# Patient Record
Sex: Male | Born: 1975 | Race: Black or African American | Hispanic: No | Marital: Married | Smoking: Former smoker
Health system: Southern US, Community
[De-identification: ages and names within clinical notes are randomized; demographics above are authoritative.]

## PROBLEM LIST (undated history)

## (undated) ENCOUNTER — Ambulatory Visit
Payer: PRIVATE HEALTH INSURANCE | Attending: Student in an Organized Health Care Education/Training Program | Primary: Student in an Organized Health Care Education/Training Program

## (undated) ENCOUNTER — Encounter: Payer: BLUE CROSS/BLUE SHIELD | Attending: Clinical | Primary: Clinical

## (undated) ENCOUNTER — Encounter

## (undated) ENCOUNTER — Ambulatory Visit

## (undated) ENCOUNTER — Ambulatory Visit: Attending: Adult Health | Primary: Adult Health

## (undated) ENCOUNTER — Telehealth
Attending: Student in an Organized Health Care Education/Training Program | Primary: Student in an Organized Health Care Education/Training Program

## (undated) ENCOUNTER — Encounter: Attending: Adult Health | Primary: Adult Health

## (undated) ENCOUNTER — Encounter: Attending: Clinical | Primary: Clinical

## (undated) ENCOUNTER — Telehealth

## (undated) ENCOUNTER — Encounter
Attending: Student in an Organized Health Care Education/Training Program | Primary: Student in an Organized Health Care Education/Training Program

## (undated) ENCOUNTER — Encounter: Payer: BLUE CROSS/BLUE SHIELD | Attending: Adult Health | Primary: Adult Health

## (undated) ENCOUNTER — Ambulatory Visit: Payer: PRIVATE HEALTH INSURANCE

## (undated) ENCOUNTER — Ambulatory Visit: Payer: BLUE CROSS/BLUE SHIELD

## (undated) ENCOUNTER — Ambulatory Visit: Attending: Pharmacist | Primary: Pharmacist

## (undated) DIAGNOSIS — Z8659 Personal history of other mental and behavioral disorders: Secondary | ICD-10-CM

## (undated) DIAGNOSIS — W3400XA Accidental discharge from unspecified firearms or gun, initial encounter: Secondary | ICD-10-CM

## (undated) DIAGNOSIS — G8929 Other chronic pain: Secondary | ICD-10-CM

## (undated) DIAGNOSIS — E785 Hyperlipidemia, unspecified: Secondary | ICD-10-CM

## (undated) DIAGNOSIS — D649 Anemia, unspecified: Secondary | ICD-10-CM

## (undated) DIAGNOSIS — K219 Gastro-esophageal reflux disease without esophagitis: Secondary | ICD-10-CM

## (undated) DIAGNOSIS — R945 Abnormal results of liver function studies: Secondary | ICD-10-CM

## (undated) DIAGNOSIS — G709 Myoneural disorder, unspecified: Secondary | ICD-10-CM

## (undated) DIAGNOSIS — I272 Pulmonary hypertension, unspecified: Secondary | ICD-10-CM

## (undated) DIAGNOSIS — M25559 Pain in unspecified hip: Secondary | ICD-10-CM

## (undated) DIAGNOSIS — Z8489 Family history of other specified conditions: Secondary | ICD-10-CM

## (undated) DIAGNOSIS — J45909 Unspecified asthma, uncomplicated: Secondary | ICD-10-CM

## (undated) DIAGNOSIS — Z5189 Encounter for other specified aftercare: Secondary | ICD-10-CM

## (undated) DIAGNOSIS — I2510 Atherosclerotic heart disease of native coronary artery without angina pectoris: Secondary | ICD-10-CM

## (undated) DIAGNOSIS — R7989 Other specified abnormal findings of blood chemistry: Secondary | ICD-10-CM

## (undated) DIAGNOSIS — I10 Essential (primary) hypertension: Secondary | ICD-10-CM

## (undated) DIAGNOSIS — Z955 Presence of coronary angioplasty implant and graft: Secondary | ICD-10-CM

## (undated) DIAGNOSIS — I499 Cardiac arrhythmia, unspecified: Secondary | ICD-10-CM

## (undated) DIAGNOSIS — I509 Heart failure, unspecified: Secondary | ICD-10-CM

## (undated) DIAGNOSIS — Z79899 Other long term (current) drug therapy: Secondary | ICD-10-CM

## (undated) DIAGNOSIS — M25561 Pain in right knee: Secondary | ICD-10-CM

## (undated) HISTORY — DX: Other long term (current) drug therapy: Z79.899

## (undated) HISTORY — DX: Accidental discharge from unspecified firearms or gun, initial encounter: W34.00XA

## (undated) HISTORY — DX: Pain in unspecified hip: M25.559

## (undated) HISTORY — DX: Pulmonary hypertension, unspecified: I27.20

## (undated) HISTORY — DX: Atherosclerotic heart disease of native coronary artery without angina pectoris: I25.10

## (undated) HISTORY — DX: Gastro-esophageal reflux disease without esophagitis: K21.9

## (undated) HISTORY — DX: Encounter for other specified aftercare: Z51.89

## (undated) HISTORY — DX: Essential (primary) hypertension: I10

## (undated) HISTORY — DX: Presence of coronary angioplasty implant and graft: Z95.5

## (undated) HISTORY — PX: CHOLECYSTECTOMY: SHX55

## (undated) HISTORY — DX: Other chronic pain: G89.29

## (undated) HISTORY — DX: Hyperlipidemia, unspecified: E78.5

## (undated) HISTORY — PX: ABDOMINAL SURGERY: SHX537

## (undated) HISTORY — PX: ARM WOUND REPAIR / CLOSURE: SUR1141

---
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
## 2000-10-01 ENCOUNTER — Encounter: Payer: Self-pay | Admitting: Emergency Medicine

## 2000-10-01 ENCOUNTER — Emergency Department (HOSPITAL_COMMUNITY): Admission: EM | Admit: 2000-10-01 | Discharge: 2000-10-01 | Payer: Self-pay | Admitting: Emergency Medicine

## 2001-02-27 ENCOUNTER — Encounter: Payer: Self-pay | Admitting: Emergency Medicine

## 2001-02-27 ENCOUNTER — Emergency Department (HOSPITAL_COMMUNITY): Admission: EM | Admit: 2001-02-27 | Discharge: 2001-02-27 | Payer: Self-pay | Admitting: Emergency Medicine

## 2001-04-13 ENCOUNTER — Emergency Department (HOSPITAL_COMMUNITY): Admission: EM | Admit: 2001-04-13 | Discharge: 2001-04-13 | Payer: Self-pay | Admitting: Emergency Medicine

## 2001-04-13 ENCOUNTER — Encounter: Payer: Self-pay | Admitting: Emergency Medicine

## 2001-08-14 ENCOUNTER — Emergency Department (HOSPITAL_COMMUNITY): Admission: EM | Admit: 2001-08-14 | Discharge: 2001-08-14 | Payer: Self-pay | Admitting: Emergency Medicine

## 2001-11-02 ENCOUNTER — Encounter: Payer: Self-pay | Admitting: Emergency Medicine

## 2001-11-02 ENCOUNTER — Emergency Department (HOSPITAL_COMMUNITY): Admission: EM | Admit: 2001-11-02 | Discharge: 2001-11-02 | Payer: Self-pay | Admitting: Emergency Medicine

## 2001-11-09 ENCOUNTER — Emergency Department (HOSPITAL_COMMUNITY): Admission: EM | Admit: 2001-11-09 | Discharge: 2001-11-09 | Payer: Self-pay | Admitting: Emergency Medicine

## 2001-11-29 ENCOUNTER — Emergency Department (HOSPITAL_COMMUNITY): Admission: EM | Admit: 2001-11-29 | Discharge: 2001-11-29 | Payer: Self-pay | Admitting: Emergency Medicine

## 2003-10-23 ENCOUNTER — Other Ambulatory Visit: Payer: Self-pay

## 2004-04-19 ENCOUNTER — Emergency Department: Payer: Self-pay | Admitting: Emergency Medicine

## 2004-04-23 ENCOUNTER — Emergency Department: Payer: Self-pay | Admitting: Unknown Physician Specialty

## 2004-09-04 ENCOUNTER — Emergency Department: Payer: Self-pay | Admitting: Unknown Physician Specialty

## 2004-09-07 ENCOUNTER — Other Ambulatory Visit: Payer: Self-pay

## 2004-09-07 ENCOUNTER — Emergency Department: Payer: Self-pay | Admitting: Unknown Physician Specialty

## 2005-04-29 ENCOUNTER — Emergency Department: Payer: Self-pay | Admitting: Emergency Medicine

## 2006-08-04 ENCOUNTER — Emergency Department: Payer: Self-pay | Admitting: Internal Medicine

## 2006-10-15 ENCOUNTER — Emergency Department: Payer: Self-pay | Admitting: Emergency Medicine

## 2006-12-16 ENCOUNTER — Other Ambulatory Visit: Payer: Self-pay

## 2006-12-16 ENCOUNTER — Emergency Department: Payer: Self-pay | Admitting: Emergency Medicine

## 2007-02-07 ENCOUNTER — Emergency Department: Payer: Self-pay

## 2007-09-05 ENCOUNTER — Inpatient Hospital Stay: Payer: Self-pay | Admitting: Internal Medicine

## 2007-09-05 ENCOUNTER — Other Ambulatory Visit: Payer: Self-pay

## 2008-04-07 ENCOUNTER — Emergency Department: Payer: Self-pay | Admitting: Emergency Medicine

## 2008-07-03 ENCOUNTER — Emergency Department: Payer: Self-pay | Admitting: Unknown Physician Specialty

## 2009-10-02 ENCOUNTER — Ambulatory Visit: Payer: Self-pay | Admitting: Family Medicine

## 2010-01-21 ENCOUNTER — Emergency Department: Payer: Self-pay | Admitting: Emergency Medicine

## 2010-04-19 ENCOUNTER — Emergency Department: Payer: Self-pay | Admitting: Emergency Medicine

## 2012-02-16 ENCOUNTER — Emergency Department: Payer: Self-pay | Admitting: Emergency Medicine

## 2012-04-15 ENCOUNTER — Ambulatory Visit: Payer: Self-pay | Admitting: Family Medicine

## 2012-05-22 ENCOUNTER — Emergency Department: Payer: Self-pay | Admitting: Emergency Medicine

## 2012-05-24 LAB — BETA STREP CULTURE(ARMC)

## 2012-10-20 ENCOUNTER — Emergency Department: Payer: Self-pay

## 2012-12-12 ENCOUNTER — Ambulatory Visit: Payer: Self-pay | Admitting: Family Medicine

## 2012-12-22 ENCOUNTER — Encounter: Payer: Self-pay | Admitting: Family Medicine

## 2013-01-04 ENCOUNTER — Encounter: Payer: Self-pay | Admitting: Family Medicine

## 2013-07-22 ENCOUNTER — Emergency Department: Payer: Self-pay | Admitting: Internal Medicine

## 2013-07-22 LAB — URINALYSIS, COMPLETE
Bacteria: NONE SEEN
Bilirubin,UR: NEGATIVE
Glucose,UR: NEGATIVE mg/dL (ref 0–75)
Ketone: NEGATIVE
Leukocyte Esterase: NEGATIVE
Nitrite: NEGATIVE
Ph: 7 (ref 4.5–8.0)
Protein: 30
RBC,UR: <1 /HPF (ref 0–5)
Specific Gravity: 1.014 (ref 1.003–1.030)
Squamous Epithelial: NONE SEEN
WBC UR: NONE SEEN /HPF (ref 0–5)

## 2013-07-22 LAB — CBC
HCT: 45.1 % (ref 40.0–52.0)
HGB: 14 g/dL (ref 13.0–18.0)
MCH: 24.3 pg — ABNORMAL LOW (ref 26.0–34.0)
MCHC: 31.1 g/dL — ABNORMAL LOW (ref 32.0–36.0)
MCV: 78 fL — ABNORMAL LOW (ref 80–100)
Platelet: 399 10*3/uL (ref 150–440)
RBC: 5.78 10*6/uL (ref 4.40–5.90)
RDW: 15.3 % — ABNORMAL HIGH (ref 11.5–14.5)
WBC: 8.5 10*3/uL (ref 3.8–10.6)

## 2013-07-22 LAB — COMPREHENSIVE METABOLIC PANEL
Albumin: 3.6 g/dL (ref 3.4–5.0)
Alkaline Phosphatase: 148 U/L — ABNORMAL HIGH
Anion Gap: 7 (ref 7–16)
BUN: 12 mg/dL (ref 7–18)
Bilirubin,Total: 0.3 mg/dL (ref 0.2–1.0)
Calcium, Total: 9.3 mg/dL (ref 8.5–10.1)
Chloride: 101 mmol/L (ref 98–107)
Co2: 28 mmol/L (ref 21–32)
Creatinine: 1.19 mg/dL (ref 0.60–1.30)
EGFR (African American): >60
EGFR (Non-African Amer.): >60
Glucose: 110 mg/dL — ABNORMAL HIGH (ref 65–99)
Osmolality: 272 (ref 275–301)
Potassium: 3.9 mmol/L (ref 3.5–5.1)
SGOT(AST): 17 U/L (ref 15–37)
SGPT (ALT): 51 U/L (ref 12–78)
Sodium: 136 mmol/L (ref 136–145)
Total Protein: 9.3 g/dL — ABNORMAL HIGH (ref 6.4–8.2)

## 2013-11-13 ENCOUNTER — Ambulatory Visit: Payer: Self-pay | Admitting: Family Medicine

## 2013-11-27 ENCOUNTER — Emergency Department: Payer: Self-pay | Admitting: Emergency Medicine

## 2014-04-06 HISTORY — PX: BLADDER SURGERY: SHX569

## 2014-06-17 ENCOUNTER — Emergency Department: Payer: Self-pay | Admitting: Emergency Medicine

## 2014-09-14 DIAGNOSIS — W3400XA Accidental discharge from unspecified firearms or gun, initial encounter: Secondary | ICD-10-CM

## 2014-09-14 HISTORY — DX: Accidental discharge from unspecified firearms or gun, initial encounter: W34.00XA

## 2014-09-14 HISTORY — PX: KIDNEY SURGERY: SHX687

## 2014-09-27 ENCOUNTER — Ambulatory Visit: Payer: Self-pay | Admitting: Family Medicine

## 2014-10-01 ENCOUNTER — Encounter: Payer: Self-pay | Admitting: Family Medicine

## 2014-10-01 ENCOUNTER — Ambulatory Visit (INDEPENDENT_AMBULATORY_CARE_PROVIDER_SITE_OTHER): Payer: BLUE CROSS/BLUE SHIELD | Admitting: Family Medicine

## 2014-10-01 VITALS — BP 124/88 | HR 110 | Temp 97.7°F | Resp 18 | Ht 71.0 in | Wt 221.2 lb

## 2014-10-01 DIAGNOSIS — E785 Hyperlipidemia, unspecified: Secondary | ICD-10-CM | POA: Diagnosis not present

## 2014-10-01 DIAGNOSIS — M25561 Pain in right knee: Secondary | ICD-10-CM | POA: Insufficient documentation

## 2014-10-01 DIAGNOSIS — T07XXXA Unspecified multiple injuries, initial encounter: Secondary | ICD-10-CM

## 2014-10-01 DIAGNOSIS — I10 Essential (primary) hypertension: Secondary | ICD-10-CM | POA: Insufficient documentation

## 2014-10-01 DIAGNOSIS — R000 Tachycardia, unspecified: Secondary | ICD-10-CM | POA: Diagnosis not present

## 2014-10-01 DIAGNOSIS — R29898 Other symptoms and signs involving the musculoskeletal system: Secondary | ICD-10-CM | POA: Insufficient documentation

## 2014-10-01 DIAGNOSIS — T07 Unspecified multiple injuries: Secondary | ICD-10-CM | POA: Diagnosis not present

## 2014-10-01 HISTORY — DX: Pain in right knee: M25.561

## 2014-10-01 MED ORDER — HYDROCODONE-ACETAMINOPHEN 10-325 MG PO TABS: 1.0000 | ORAL_TABLET | Freq: Three times a day (TID) | ORAL | Status: DC | PRN

## 2014-10-01 NOTE — Patient Instructions (Signed)
Obesity Obesity is defined as having too much total body fat and a body mass index (BMI) of 30 or more. BMI is an estimate of body fat and is calculated from your height and weight. Obesity happens when you consume more calories than you can burn by exercising or performing daily physical tasks. Prolonged obesity can cause major illnesses or emergencies, such as:   Stroke.  Heart disease.  Diabetes.  Cancer.  Arthritis.  High blood pressure (hypertension).  High cholesterol.  Sleep apnea.  Erectile dysfunction.  Infertility problems. CAUSES   Regularly eating unhealthy foods.  Physical inactivity.  Certain disorders, such as an underactive thyroid (hypothyroidism), Cushing's syndrome, and polycystic ovarian syndrome.  Certain medicines, such as steroids, some depression medicines, and antipsychotics.  Genetics.  Lack of sleep. DIAGNOSIS  A health care provider can diagnose obesity after calculating your BMI. Obesity will be diagnosed if your BMI is 30 or higher.  There are other methods of measuring obesity levels. Some other methods include measuring your skinfold thickness, your waist circumference, and comparing your hip circumference to your waist circumference. TREATMENT  A healthy treatment program includes some or all of the following:  Long-term dietary changes.  Exercise and physical activity.  Behavioral and lifestyle changes.  Medicine only under the supervision of your health care provider. Medicines may help, but only if they are used with diet and exercise programs. An unhealthy treatment program includes:  Fasting.  Fad diets.  Supplements and drugs. These choices do not succeed in long-term weight control.  HOME CARE INSTRUCTIONS   Exercise and perform physical activity as directed by your health care provider. To increase physical activity, try the following:  Use stairs instead of elevators.  Park farther away from store  entrances.  Garden, bike, or walk instead of watching television or using the computer.  Eat healthy, low-calorie foods and drinks on a regular basis. Eat more fruits and vegetables. Use low-calorie cookbooks or take healthy cooking classes.  Limit fast food, sweets, and processed snack foods.  Eat smaller portions.  Keep a daily journal of everything you eat. There are many free websites to help you with this. It may be helpful to measure your foods so you can determine if you are eating the correct portion sizes.  Avoid drinking alcohol. Drink more water and drinks without calories.  Take vitamins and supplements only as recommended by your health care provider.  Weight-loss support groups, Tax adviser, counselors, and stress reduction education can also be very helpful. SEEK IMMEDIATE MEDICAL CARE IF:  You have chest pain or tightness.  You have trouble breathing or feel short of breath.  You have weakness or leg numbness.  You feel confused or have trouble talking.  You have sudden changes in your vision. MAKE SURE YOU:  Understand these instructions.  Will watch your condition.  Will get help right away if you are not doing well or get worse. Document Released: 04/30/2004 Document Revised: 08/07/2013 Document Reviewed: 04/29/2011 [facility] Patient Information 2015 [place], [place]. This information is not intended to replace advice given to you by your health care provider. Make sure you discuss any questions you have with your health care provider. Place obesity patient instructions here.
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
## 2014-10-09 ENCOUNTER — Encounter: Payer: Self-pay | Admitting: Physical Therapy

## 2014-10-09 ENCOUNTER — Ambulatory Visit: Payer: BLUE CROSS/BLUE SHIELD | Attending: Family Medicine | Admitting: Physical Therapy

## 2014-10-09 DIAGNOSIS — M25561 Pain in right knee: Secondary | ICD-10-CM | POA: Diagnosis present

## 2014-10-09 DIAGNOSIS — R531 Weakness: Secondary | ICD-10-CM | POA: Diagnosis not present

## 2014-10-09 DIAGNOSIS — R262 Difficulty in walking, not elsewhere classified: Secondary | ICD-10-CM | POA: Diagnosis not present
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
## 2014-10-16 ENCOUNTER — Encounter: Payer: Self-pay | Admitting: Physical Therapy

## 2014-10-16 ENCOUNTER — Ambulatory Visit: Payer: BLUE CROSS/BLUE SHIELD | Admitting: Physical Therapy

## 2014-10-16 DIAGNOSIS — R531 Weakness: Secondary | ICD-10-CM

## 2014-10-16 DIAGNOSIS — R262 Difficulty in walking, not elsewhere classified: Secondary | ICD-10-CM

## 2014-10-16 DIAGNOSIS — M25561 Pain in right knee: Secondary | ICD-10-CM

## 2014-10-16 NOTE — Patient Instructions (Addendum)
HIP / KNEE: Flexion, Heel Slides - Supine   Slide heel up toward buttocks, keeping leg in straight line. _10__ reps per set, _2__ sets per day, _5__ days per week Use towel or pillowcase under heel as needed.  Copyright  VHI. All rights reserved.  Hip Abduction / Adduction: with Knee Flexion (Supine)   With right knee bent, gently lower knee to side and return. Repeat _10___ times per set. Do __10__ sets per session. Do _2___ sessions per day.  http://orth.exer.us/682   Copyright  VHI. All rights reserved.  KNEE: Extension, Short Arc Quads - Supine   Place bolster under knees. Raise one leg until knee is straight. __8_ reps per set, _2__ sets per day, _5__ days per week   Copyright  VHI. All rights reserved.  ROM: Heel Prop   Lie with pillow under right heel. Tighten the muscles on the top of leg while trying to push knee toward the floor. Hold _3___ seconds. Repeat __10__ times. Do _2___ sessions per day.  http://gt2.exer.us/287   Copyright  VHI. All rights reserved.
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
## 2014-10-18 ENCOUNTER — Encounter: Payer: Self-pay | Admitting: Physical Therapy

## 2014-10-18 ENCOUNTER — Ambulatory Visit: Payer: BLUE CROSS/BLUE SHIELD | Admitting: Physical Therapy

## 2014-10-18 DIAGNOSIS — R262 Difficulty in walking, not elsewhere classified: Secondary | ICD-10-CM

## 2014-10-18 DIAGNOSIS — M25561 Pain in right knee: Secondary | ICD-10-CM

## 2014-10-18 DIAGNOSIS — R531 Weakness: Secondary | ICD-10-CM
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
## 2014-10-22 ENCOUNTER — Ambulatory Visit: Payer: BLUE CROSS/BLUE SHIELD | Admitting: Physical Therapy

## 2014-10-22 ENCOUNTER — Encounter: Payer: Self-pay | Admitting: Physical Therapy

## 2014-10-22 DIAGNOSIS — R531 Weakness: Secondary | ICD-10-CM

## 2014-10-22 DIAGNOSIS — M25561 Pain in right knee: Secondary | ICD-10-CM

## 2014-10-22 DIAGNOSIS — R262 Difficulty in walking, not elsewhere classified: Secondary | ICD-10-CM

## 2014-10-22 NOTE — Patient Instructions (Addendum)
Mini Squat   Place feet in line with hips. Look straight ahead, back straight. Bend knees _30___ degrees. May hold ___0_ pounds in each hand. Hold onto RW or counter as needed.  Hold ____ seconds. Repeat ____ times. Do ____ sessions per day. CAUTION: Move slowly. Wear supportive shoes as needed.  Copyright  VHI. All rights reserved.  Calf Raise: Sitting (Dumbbell)   While sitting, rise up on toes. (Do Not use weights on knees) Do __2__ sets. Complete ___15_ repetitions.  http://sb.exer.us/236   Copyright  VHI. All rights reserved.
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
## 2014-10-23 ENCOUNTER — Ambulatory Visit (INDEPENDENT_AMBULATORY_CARE_PROVIDER_SITE_OTHER): Payer: BLUE CROSS/BLUE SHIELD | Admitting: Family Medicine

## 2014-10-23 ENCOUNTER — Telehealth: Payer: Self-pay

## 2014-10-23 ENCOUNTER — Encounter: Payer: Self-pay | Admitting: Family Medicine

## 2014-10-23 VITALS — BP 124/86 | HR 102 | Temp 98.0°F | Resp 18 | Ht 71.0 in | Wt 220.0 lb

## 2014-10-23 DIAGNOSIS — M79604 Pain in right leg: Secondary | ICD-10-CM

## 2014-10-23 DIAGNOSIS — M25561 Pain in right knee: Secondary | ICD-10-CM | POA: Diagnosis not present

## 2014-10-23 DIAGNOSIS — M79675 Pain in left toe(s): Secondary | ICD-10-CM

## 2014-10-23 DIAGNOSIS — M795 Residual foreign body in soft tissue: Secondary | ICD-10-CM

## 2014-10-23 MED ORDER — HYDROCODONE-ACETAMINOPHEN 10-325 MG PO TABS: 1.0000 | ORAL_TABLET | Freq: Three times a day (TID) | ORAL | Status: DC | PRN
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
## 2014-10-23 NOTE — Telephone Encounter (Signed)
Pt wife called and stated that Dale Kennedy forgot to speak to you about needing his insurance to cover the use of a wheelchair and what he would need to do for that.
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
## 2014-10-24 ENCOUNTER — Encounter: Payer: Self-pay | Admitting: Physical Therapy

## 2014-10-24 ENCOUNTER — Ambulatory Visit: Payer: BLUE CROSS/BLUE SHIELD | Admitting: Physical Therapy

## 2014-10-24 DIAGNOSIS — R262 Difficulty in walking, not elsewhere classified: Secondary | ICD-10-CM

## 2014-10-24 DIAGNOSIS — M25561 Pain in right knee: Secondary | ICD-10-CM | POA: Diagnosis not present

## 2014-10-24 DIAGNOSIS — R531 Weakness: Secondary | ICD-10-CM

## 2014-10-24 DIAGNOSIS — S3710XA Unspecified injury of ureter, initial encounter: Secondary | ICD-10-CM | POA: Insufficient documentation
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
## 2014-10-24 NOTE — Telephone Encounter (Signed)
Pt wife came by office and picked up.

## 2014-10-24 NOTE — Telephone Encounter (Signed)
Note on your desk.

## 2014-10-30 ENCOUNTER — Encounter: Payer: Self-pay | Admitting: Physical Therapy

## 2014-10-30 ENCOUNTER — Ambulatory Visit: Payer: BLUE CROSS/BLUE SHIELD | Admitting: Physical Therapy

## 2014-10-30 DIAGNOSIS — M25561 Pain in right knee: Secondary | ICD-10-CM

## 2014-10-30 DIAGNOSIS — R531 Weakness: Secondary | ICD-10-CM

## 2014-10-30 DIAGNOSIS — R262 Difficulty in walking, not elsewhere classified: Secondary | ICD-10-CM
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
## 2014-11-01 ENCOUNTER — Ambulatory Visit: Payer: BLUE CROSS/BLUE SHIELD | Admitting: Physical Therapy

## 2014-11-01 ENCOUNTER — Encounter: Payer: Self-pay | Admitting: Physical Therapy

## 2014-11-01 DIAGNOSIS — R262 Difficulty in walking, not elsewhere classified: Secondary | ICD-10-CM

## 2014-11-01 DIAGNOSIS — R531 Weakness: Secondary | ICD-10-CM

## 2014-11-01 DIAGNOSIS — M25561 Pain in right knee: Secondary | ICD-10-CM
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
## 2014-11-01 NOTE — Patient Instructions (Signed)
Hamstring Stretch, Seated (Strap, Two Chairs)   Sit with one leg extended onto facing chair. Loop strap over outstretched foot at ball of big toe. Lengthen spine. Hold for __20 seconds. Repeat __2__ times each leg.  Copyright  VHI. All rights reserved.  ANKLE: Dorsiflexion, Step Unilateral   Stand on step, hang one heel off back of step. Hold __20_ seconds. _2__ reps per set, _2__ sets per day, _5__ days per week. Hold onto a support.  Copyright  VHI. All rights reserved.

## 2014-11-05 ENCOUNTER — Ambulatory Visit: Payer: BLUE CROSS/BLUE SHIELD | Attending: Family Medicine | Admitting: Physical Therapy

## 2014-11-05 ENCOUNTER — Encounter: Payer: Self-pay | Admitting: Physical Therapy

## 2014-11-05 DIAGNOSIS — M25561 Pain in right knee: Secondary | ICD-10-CM

## 2014-11-05 DIAGNOSIS — R531 Weakness: Secondary | ICD-10-CM

## 2014-11-05 DIAGNOSIS — R262 Difficulty in walking, not elsewhere classified: Secondary | ICD-10-CM | POA: Diagnosis present
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
## 2014-11-07 ENCOUNTER — Ambulatory Visit: Payer: BLUE CROSS/BLUE SHIELD | Admitting: Physical Therapy

## 2014-11-07 ENCOUNTER — Encounter: Payer: Self-pay | Admitting: Physical Therapy

## 2014-11-07 DIAGNOSIS — R531 Weakness: Secondary | ICD-10-CM

## 2014-11-07 DIAGNOSIS — M25561 Pain in right knee: Secondary | ICD-10-CM

## 2014-11-07 DIAGNOSIS — R262 Difficulty in walking, not elsewhere classified: Secondary | ICD-10-CM
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
## 2014-11-12 ENCOUNTER — Encounter: Payer: Self-pay | Admitting: Physical Therapy

## 2014-11-12 ENCOUNTER — Ambulatory Visit: Payer: BLUE CROSS/BLUE SHIELD | Admitting: Physical Therapy

## 2014-11-12 DIAGNOSIS — R262 Difficulty in walking, not elsewhere classified: Secondary | ICD-10-CM

## 2014-11-12 DIAGNOSIS — M25561 Pain in right knee: Secondary | ICD-10-CM

## 2014-11-12 DIAGNOSIS — R531 Weakness: Secondary | ICD-10-CM
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
## 2014-11-14 ENCOUNTER — Ambulatory Visit: Payer: BLUE CROSS/BLUE SHIELD | Admitting: Physical Therapy

## 2014-11-14 ENCOUNTER — Encounter: Payer: Self-pay | Admitting: Physical Therapy

## 2014-11-14 DIAGNOSIS — R531 Weakness: Secondary | ICD-10-CM

## 2014-11-14 DIAGNOSIS — M25561 Pain in right knee: Secondary | ICD-10-CM

## 2014-11-14 DIAGNOSIS — R262 Difficulty in walking, not elsewhere classified: Secondary | ICD-10-CM
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
## 2014-11-19 ENCOUNTER — Encounter: Payer: Self-pay | Admitting: Physical Therapy

## 2014-11-19 ENCOUNTER — Telehealth: Payer: Self-pay | Admitting: Family Medicine

## 2014-11-19 ENCOUNTER — Ambulatory Visit: Payer: BLUE CROSS/BLUE SHIELD | Admitting: Physical Therapy

## 2014-11-19 DIAGNOSIS — R531 Weakness: Secondary | ICD-10-CM

## 2014-11-19 DIAGNOSIS — R262 Difficulty in walking, not elsewhere classified: Secondary | ICD-10-CM

## 2014-11-19 DIAGNOSIS — M25561 Pain in right knee: Secondary | ICD-10-CM
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
## 2014-11-19 NOTE — Patient Instructions (Signed)
EXTENSION: Side-Lying (Manual Resistance)   Lie on left side with knees bent. Against resistance, straighten top knee. Complete _2__ sets of __8_ repetitions. Perform _2_ sessions per day.  Copyright  VHI. All rights reserved.

## 2014-11-19 NOTE — Telephone Encounter (Signed)
Dale Kennedy from [facility] received the referral but is needing Dr Clide Dales to write light weight on the prescription that was sent over. Please [phone]

## 2014-11-20 NOTE — Telephone Encounter (Signed)
Spoke with Levada Dy from [facility] and she will fax me the original prescription and I will have Dr. Rutherford Nail add what is needed,sign it,and fax it back to her.

## 2014-11-21 ENCOUNTER — Encounter: Payer: Self-pay | Admitting: Physical Therapy

## 2014-11-21 ENCOUNTER — Ambulatory Visit: Payer: BLUE CROSS/BLUE SHIELD | Admitting: Physical Therapy

## 2014-11-21 DIAGNOSIS — R531 Weakness: Secondary | ICD-10-CM

## 2014-11-21 DIAGNOSIS — R262 Difficulty in walking, not elsewhere classified: Secondary | ICD-10-CM

## 2014-11-21 DIAGNOSIS — M25561 Pain in right knee: Secondary | ICD-10-CM
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
## 2014-11-26 ENCOUNTER — Ambulatory Visit (INDEPENDENT_AMBULATORY_CARE_PROVIDER_SITE_OTHER): Payer: BLUE CROSS/BLUE SHIELD | Admitting: Family Medicine

## 2014-11-26 ENCOUNTER — Encounter: Payer: Self-pay | Admitting: Family Medicine

## 2014-11-26 ENCOUNTER — Ambulatory Visit: Payer: BLUE CROSS/BLUE SHIELD | Admitting: Physical Therapy

## 2014-11-26 VITALS — BP 126/80 | HR 84 | Temp 98.1°F | Resp 18 | Ht 71.0 in | Wt 199.0 lb

## 2014-11-26 DIAGNOSIS — G4700 Insomnia, unspecified: Secondary | ICD-10-CM | POA: Diagnosis not present

## 2014-11-26 DIAGNOSIS — M25561 Pain in right knee: Secondary | ICD-10-CM | POA: Diagnosis not present

## 2014-11-26 DIAGNOSIS — E785 Hyperlipidemia, unspecified: Secondary | ICD-10-CM | POA: Insufficient documentation

## 2014-11-26 DIAGNOSIS — R531 Weakness: Secondary | ICD-10-CM | POA: Diagnosis not present

## 2014-11-26 DIAGNOSIS — I10 Essential (primary) hypertension: Secondary | ICD-10-CM | POA: Diagnosis not present

## 2014-11-26 DIAGNOSIS — R262 Difficulty in walking, not elsewhere classified: Secondary | ICD-10-CM

## 2014-11-26 DIAGNOSIS — S3710XA Unspecified injury of ureter, initial encounter: Secondary | ICD-10-CM | POA: Diagnosis not present

## 2014-11-26 DIAGNOSIS — R29898 Other symptoms and signs involving the musculoskeletal system: Secondary | ICD-10-CM

## 2014-11-26 MED ORDER — ZOLPIDEM TARTRATE 5 MG PO TABS: 5.0000 mg | ORAL_TABLET | Freq: Every evening | ORAL | Status: DC | PRN

## 2014-11-26 MED ORDER — HYDROCODONE-ACETAMINOPHEN 10-325 MG PO TABS: 1.0000 | ORAL_TABLET | Freq: Three times a day (TID) | ORAL | Status: DC | PRN
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
## 2014-11-28 ENCOUNTER — Encounter: Payer: Self-pay | Admitting: Family Medicine

## 2014-11-28 ENCOUNTER — Ambulatory Visit: Payer: BLUE CROSS/BLUE SHIELD | Admitting: Physical Therapy

## 2014-11-28 ENCOUNTER — Encounter: Payer: Self-pay | Admitting: Physical Therapy

## 2014-11-28 DIAGNOSIS — G4700 Insomnia, unspecified: Secondary | ICD-10-CM | POA: Insufficient documentation

## 2014-11-28 DIAGNOSIS — R531 Weakness: Secondary | ICD-10-CM | POA: Diagnosis not present

## 2014-11-28 DIAGNOSIS — M25561 Pain in right knee: Secondary | ICD-10-CM

## 2014-11-28 DIAGNOSIS — R262 Difficulty in walking, not elsewhere classified: Secondary | ICD-10-CM
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
## 2014-12-03 ENCOUNTER — Encounter: Payer: Self-pay | Admitting: Physical Therapy

## 2014-12-03 ENCOUNTER — Ambulatory Visit: Payer: BLUE CROSS/BLUE SHIELD | Admitting: Physical Therapy

## 2014-12-03 DIAGNOSIS — R531 Weakness: Secondary | ICD-10-CM | POA: Diagnosis not present

## 2014-12-03 DIAGNOSIS — R262 Difficulty in walking, not elsewhere classified: Secondary | ICD-10-CM
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
## 2014-12-05 ENCOUNTER — Ambulatory Visit: Payer: BLUE CROSS/BLUE SHIELD | Admitting: Physical Therapy

## 2014-12-05 DIAGNOSIS — R531 Weakness: Secondary | ICD-10-CM | POA: Diagnosis not present

## 2014-12-05 DIAGNOSIS — M25561 Pain in right knee: Secondary | ICD-10-CM

## 2014-12-05 DIAGNOSIS — R262 Difficulty in walking, not elsewhere classified: Secondary | ICD-10-CM
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
## 2014-12-05 NOTE — Patient Instructions (Signed)
Ankle Awareness   Using support for balance, stand with feet shoulder width apart. Rise up on toes. Hold __2__ seconds. Relax. Repeat _12___ times. Do __2__ sessions per day.  http://gt2.exer.us/856   Copyright  VHI. All rights reserved.

## 2014-12-12 ENCOUNTER — Encounter: Payer: Self-pay | Admitting: Physical Therapy

## 2014-12-12 ENCOUNTER — Ambulatory Visit: Payer: BLUE CROSS/BLUE SHIELD | Attending: Family Medicine | Admitting: Physical Therapy

## 2014-12-12 DIAGNOSIS — M25561 Pain in right knee: Secondary | ICD-10-CM | POA: Diagnosis present

## 2014-12-12 DIAGNOSIS — R262 Difficulty in walking, not elsewhere classified: Secondary | ICD-10-CM | POA: Insufficient documentation

## 2014-12-12 DIAGNOSIS — R531 Weakness: Secondary | ICD-10-CM | POA: Insufficient documentation
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
## 2014-12-19 ENCOUNTER — Encounter: Payer: BLUE CROSS/BLUE SHIELD | Admitting: Physical Therapy

## 2014-12-19 ENCOUNTER — Ambulatory Visit: Payer: BLUE CROSS/BLUE SHIELD | Admitting: Physical Therapy

## 2014-12-19 ENCOUNTER — Encounter: Payer: Self-pay | Admitting: Physical Therapy

## 2014-12-19 DIAGNOSIS — R531 Weakness: Secondary | ICD-10-CM | POA: Diagnosis not present

## 2014-12-19 DIAGNOSIS — M25561 Pain in right knee: Secondary | ICD-10-CM

## 2014-12-19 DIAGNOSIS — R262 Difficulty in walking, not elsewhere classified: Secondary | ICD-10-CM
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
## 2014-12-21 ENCOUNTER — Ambulatory Visit: Payer: BLUE CROSS/BLUE SHIELD | Admitting: Physical Therapy

## 2014-12-21 ENCOUNTER — Encounter: Payer: Self-pay | Admitting: Physical Therapy

## 2014-12-21 DIAGNOSIS — R262 Difficulty in walking, not elsewhere classified: Secondary | ICD-10-CM

## 2014-12-21 DIAGNOSIS — M25561 Pain in right knee: Secondary | ICD-10-CM

## 2014-12-21 DIAGNOSIS — R531 Weakness: Secondary | ICD-10-CM | POA: Diagnosis not present
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
## 2014-12-24 ENCOUNTER — Ambulatory Visit: Payer: BLUE CROSS/BLUE SHIELD | Admitting: Physical Therapy

## 2014-12-24 ENCOUNTER — Encounter: Payer: Self-pay | Admitting: Physical Therapy

## 2014-12-24 DIAGNOSIS — R531 Weakness: Secondary | ICD-10-CM | POA: Diagnosis not present

## 2014-12-24 DIAGNOSIS — R262 Difficulty in walking, not elsewhere classified: Secondary | ICD-10-CM

## 2014-12-24 DIAGNOSIS — M25561 Pain in right knee: Secondary | ICD-10-CM
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
## 2014-12-26 ENCOUNTER — Encounter: Payer: BLUE CROSS/BLUE SHIELD | Admitting: Physical Therapy

## 2014-12-27 ENCOUNTER — Encounter: Payer: Self-pay | Admitting: Physical Therapy

## 2014-12-27 ENCOUNTER — Ambulatory Visit: Payer: BLUE CROSS/BLUE SHIELD | Admitting: Physical Therapy

## 2014-12-27 DIAGNOSIS — R531 Weakness: Secondary | ICD-10-CM | POA: Diagnosis not present

## 2014-12-27 DIAGNOSIS — M25561 Pain in right knee: Secondary | ICD-10-CM

## 2014-12-27 DIAGNOSIS — R262 Difficulty in walking, not elsewhere classified: Secondary | ICD-10-CM
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
## 2014-12-31 ENCOUNTER — Encounter: Payer: Self-pay | Admitting: Physical Therapy

## 2014-12-31 ENCOUNTER — Ambulatory Visit: Payer: BLUE CROSS/BLUE SHIELD | Admitting: Physical Therapy

## 2014-12-31 DIAGNOSIS — R531 Weakness: Secondary | ICD-10-CM | POA: Diagnosis not present

## 2014-12-31 DIAGNOSIS — R262 Difficulty in walking, not elsewhere classified: Secondary | ICD-10-CM

## 2014-12-31 DIAGNOSIS — M25561 Pain in right knee: Secondary | ICD-10-CM
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
## 2015-01-02 ENCOUNTER — Ambulatory Visit: Payer: BLUE CROSS/BLUE SHIELD | Admitting: Physical Therapy

## 2015-01-02 ENCOUNTER — Encounter: Payer: Self-pay | Admitting: Physical Therapy

## 2015-01-02 DIAGNOSIS — R531 Weakness: Secondary | ICD-10-CM

## 2015-01-02 DIAGNOSIS — M25561 Pain in right knee: Secondary | ICD-10-CM

## 2015-01-02 DIAGNOSIS — R262 Difficulty in walking, not elsewhere classified: Secondary | ICD-10-CM
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
## 2015-01-09 ENCOUNTER — Ambulatory Visit: Payer: BLUE CROSS/BLUE SHIELD | Attending: Family Medicine | Admitting: Physical Therapy

## 2015-01-09 ENCOUNTER — Encounter: Payer: Self-pay | Admitting: Physical Therapy

## 2015-01-09 DIAGNOSIS — R262 Difficulty in walking, not elsewhere classified: Secondary | ICD-10-CM | POA: Diagnosis present

## 2015-01-09 DIAGNOSIS — R531 Weakness: Secondary | ICD-10-CM

## 2015-01-09 DIAGNOSIS — M25561 Pain in right knee: Secondary | ICD-10-CM | POA: Diagnosis present
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
## 2015-01-16 ENCOUNTER — Ambulatory Visit: Payer: BLUE CROSS/BLUE SHIELD | Admitting: Physical Therapy

## 2015-01-16 ENCOUNTER — Encounter: Payer: Self-pay | Admitting: Physical Therapy

## 2015-01-16 DIAGNOSIS — R531 Weakness: Secondary | ICD-10-CM

## 2015-01-16 DIAGNOSIS — M25561 Pain in right knee: Secondary | ICD-10-CM

## 2015-01-16 DIAGNOSIS — R262 Difficulty in walking, not elsewhere classified: Secondary | ICD-10-CM
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
## 2015-01-23 ENCOUNTER — Other Ambulatory Visit: Payer: Self-pay | Admitting: Emergency Medicine

## 2015-01-23 ENCOUNTER — Ambulatory Visit: Payer: BLUE CROSS/BLUE SHIELD | Admitting: Physical Therapy

## 2015-01-23 ENCOUNTER — Encounter: Payer: Self-pay | Admitting: Physical Therapy

## 2015-01-23 DIAGNOSIS — R262 Difficulty in walking, not elsewhere classified: Secondary | ICD-10-CM

## 2015-01-23 DIAGNOSIS — R531 Weakness: Secondary | ICD-10-CM

## 2015-01-23 DIAGNOSIS — M25561 Pain in right knee: Secondary | ICD-10-CM

## 2015-01-23 MED ORDER — HYDROCODONE-ACETAMINOPHEN 10-325 MG PO TABS: 1.0000 | ORAL_TABLET | Freq: Three times a day (TID) | ORAL | Status: DC | PRN
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
## 2015-01-29 ENCOUNTER — Encounter: Payer: Self-pay | Admitting: Family Medicine

## 2015-01-29 ENCOUNTER — Ambulatory Visit (INDEPENDENT_AMBULATORY_CARE_PROVIDER_SITE_OTHER): Payer: BLUE CROSS/BLUE SHIELD | Admitting: Family Medicine

## 2015-01-29 VITALS — BP 122/70 | HR 78 | Temp 98.0°F | Resp 18 | Ht 71.0 in | Wt 208.4 lb

## 2015-01-29 DIAGNOSIS — T07 Unspecified multiple injuries: Secondary | ICD-10-CM | POA: Diagnosis not present

## 2015-01-29 DIAGNOSIS — E785 Hyperlipidemia, unspecified: Secondary | ICD-10-CM | POA: Diagnosis not present

## 2015-01-29 DIAGNOSIS — I10 Essential (primary) hypertension: Secondary | ICD-10-CM

## 2015-01-29 DIAGNOSIS — T07XXXA Unspecified multiple injuries, initial encounter: Secondary | ICD-10-CM

## 2015-01-29 DIAGNOSIS — Z23 Encounter for immunization: Secondary | ICD-10-CM | POA: Diagnosis not present

## 2015-01-29 DIAGNOSIS — R945 Abnormal results of liver function studies: Secondary | ICD-10-CM

## 2015-01-29 DIAGNOSIS — R7989 Other specified abnormal findings of blood chemistry: Secondary | ICD-10-CM | POA: Diagnosis not present

## 2015-01-29 DIAGNOSIS — R29898 Other symptoms and signs involving the musculoskeletal system: Secondary | ICD-10-CM
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
## 2015-01-30 ENCOUNTER — Encounter: Payer: Self-pay | Admitting: Physical Therapy

## 2015-01-30 ENCOUNTER — Ambulatory Visit: Payer: BLUE CROSS/BLUE SHIELD | Admitting: Physical Therapy

## 2015-01-30 DIAGNOSIS — M25561 Pain in right knee: Secondary | ICD-10-CM

## 2015-01-30 DIAGNOSIS — R531 Weakness: Secondary | ICD-10-CM | POA: Diagnosis not present

## 2015-01-30 DIAGNOSIS — R262 Difficulty in walking, not elsewhere classified: Secondary | ICD-10-CM
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
## 2015-02-06 ENCOUNTER — Encounter: Payer: Self-pay | Admitting: Physical Therapy

## 2015-02-06 ENCOUNTER — Ambulatory Visit: Payer: BLUE CROSS/BLUE SHIELD | Attending: Family Medicine | Admitting: Physical Therapy

## 2015-02-06 DIAGNOSIS — R531 Weakness: Secondary | ICD-10-CM

## 2015-02-06 DIAGNOSIS — M25561 Pain in right knee: Secondary | ICD-10-CM

## 2015-02-06 DIAGNOSIS — R262 Difficulty in walking, not elsewhere classified: Secondary | ICD-10-CM | POA: Diagnosis present
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
## 2015-02-13 ENCOUNTER — Ambulatory Visit: Payer: BLUE CROSS/BLUE SHIELD | Admitting: Physical Therapy

## 2015-02-13 ENCOUNTER — Encounter: Payer: Self-pay | Admitting: Physical Therapy

## 2015-02-13 DIAGNOSIS — M25561 Pain in right knee: Secondary | ICD-10-CM

## 2015-02-13 DIAGNOSIS — R531 Weakness: Secondary | ICD-10-CM | POA: Diagnosis not present

## 2015-02-13 DIAGNOSIS — R262 Difficulty in walking, not elsewhere classified: Secondary | ICD-10-CM
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
## 2015-02-25 ENCOUNTER — Telehealth: Payer: Self-pay | Admitting: Family Medicine

## 2015-02-25 DIAGNOSIS — M25561 Pain in right knee: Secondary | ICD-10-CM

## 2015-02-25 NOTE — Telephone Encounter (Signed)
Requesting refill on Hydrocodone. He only have enough to last through today

## 2015-02-26 MED ORDER — HYDROCODONE-ACETAMINOPHEN 10-325 MG PO TABS: 1.0000 | ORAL_TABLET | Freq: Three times a day (TID) | ORAL | Status: DC | PRN

## 2015-02-26 NOTE — Telephone Encounter (Signed)
Script printed for Dr. Rutherford Nail to sign. Family notified for pick up

## 2015-03-28 ENCOUNTER — Telehealth: Payer: Self-pay

## 2015-03-28 ENCOUNTER — Other Ambulatory Visit: Payer: Self-pay

## 2015-03-28 DIAGNOSIS — M25561 Pain in right knee: Secondary | ICD-10-CM

## 2015-03-28 MED ORDER — IBUPROFEN 800 MG PO TABS: 800.0000 mg | ORAL_TABLET | Freq: Three times a day (TID) | ORAL | Status: DC

## 2015-03-28 MED ORDER — HYDROCODONE-ACETAMINOPHEN 10-325 MG PO TABS: 1.0000 | ORAL_TABLET | Freq: Three times a day (TID) | ORAL | Status: DC | PRN

## 2015-03-28 NOTE — Telephone Encounter (Signed)
Patient requesting refill. 

## 2015-03-28 NOTE — Telephone Encounter (Signed)
Pt called and stated that he is out of his pain meds and would like a refill. Would you be willing to refill this one?

## 2015-03-28 NOTE — Telephone Encounter (Signed)
done

## 2015-04-30 ENCOUNTER — Telehealth: Payer: Self-pay | Admitting: Family Medicine

## 2015-04-30 NOTE — Telephone Encounter (Signed)
Requesting refill on Hydrocodone and Rantacid. Please send to [facility] [address]. Please call once ready

## 2015-05-01 ENCOUNTER — Ambulatory Visit: Payer: BLUE CROSS/BLUE SHIELD | Admitting: Family Medicine

## 2015-05-03 ENCOUNTER — Ambulatory Visit (INDEPENDENT_AMBULATORY_CARE_PROVIDER_SITE_OTHER): Payer: BLUE CROSS/BLUE SHIELD | Admitting: Family Medicine

## 2015-05-03 ENCOUNTER — Encounter: Payer: Self-pay | Admitting: Family Medicine

## 2015-05-03 VITALS — BP 124/80 | HR 89 | Temp 98.1°F | Resp 18 | Wt 223.4 lb

## 2015-05-03 DIAGNOSIS — E785 Hyperlipidemia, unspecified: Secondary | ICD-10-CM | POA: Diagnosis not present

## 2015-05-03 DIAGNOSIS — G8929 Other chronic pain: Secondary | ICD-10-CM | POA: Diagnosis not present

## 2015-05-03 DIAGNOSIS — R7989 Other specified abnormal findings of blood chemistry: Secondary | ICD-10-CM | POA: Diagnosis not present

## 2015-05-03 DIAGNOSIS — R52 Pain, unspecified: Secondary | ICD-10-CM | POA: Diagnosis not present

## 2015-05-03 DIAGNOSIS — I10 Essential (primary) hypertension: Secondary | ICD-10-CM | POA: Diagnosis not present

## 2015-05-03 DIAGNOSIS — Z113 Encounter for screening for infections with a predominantly sexual mode of transmission: Secondary | ICD-10-CM | POA: Diagnosis not present

## 2015-05-03 DIAGNOSIS — R945 Abnormal results of liver function studies: Secondary | ICD-10-CM

## 2015-05-03 MED ORDER — HYDROCODONE-ACETAMINOPHEN 10-325 MG PO TABS: 1.0000 | ORAL_TABLET | Freq: Three times a day (TID) | ORAL | Status: DC | PRN

## 2015-05-03 MED ORDER — AMLODIPINE BESYLATE 5 MG PO TABS: 5.0000 mg | ORAL_TABLET | Freq: Every day | ORAL | Status: DC

## 2015-05-03 MED ORDER — METOPROLOL SUCCINATE ER 25 MG PO TB24: 25.0000 mg | ORAL_TABLET | Freq: Every day | ORAL | Status: DC

## 2015-05-03 MED ORDER — ATORVASTATIN CALCIUM 40 MG PO TABS: 40.0000 mg | ORAL_TABLET | Freq: Every day | ORAL | Status: DC
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
## 2015-05-07 ENCOUNTER — Other Ambulatory Visit: Payer: Self-pay | Admitting: Family Medicine

## 2015-05-07 ENCOUNTER — Ambulatory Visit: Payer: BLUE CROSS/BLUE SHIELD | Admitting: Family Medicine

## 2015-06-01 LAB — COMPREHENSIVE METABOLIC PANEL
ALT: 49 IU/L — AB (ref 0–44)
AST: 27 IU/L (ref 0–40)
Albumin/Globulin Ratio: 1.1 (ref 1.1–2.5)
Albumin: 4.2 g/dL (ref 3.5–5.5)
Alkaline Phosphatase: 172 IU/L — ABNORMAL HIGH (ref 39–117)
BILIRUBIN TOTAL: 0.3 mg/dL (ref 0.0–1.2)
BUN / CREAT RATIO: 15 (ref 8–19)
BUN: 15 mg/dL (ref 6–20)
CHLORIDE: 98 mmol/L (ref 96–106)
CO2: 23 mmol/L (ref 18–29)
CREATININE: 0.98 mg/dL (ref 0.76–1.27)
Calcium: 9.6 mg/dL (ref 8.7–10.2)
GFR calc Af Amer: 113 mL/min/{1.73_m2} (ref >59)
GFR calc non Af Amer: 97 mL/min/{1.73_m2} (ref >59)
GLUCOSE: 87 mg/dL (ref 65–99)
Globulin, Total: 3.8 g/dL (ref 1.5–4.5)
Potassium: 4.4 mmol/L (ref 3.5–5.2)
Sodium: 137 mmol/L (ref 134–144)
Total Protein: 8 g/dL (ref 6.0–8.5)

## 2015-06-01 LAB — TSH: TSH: 2.23 u[IU]/mL (ref 0.450–4.500)

## 2015-06-01 LAB — CBC WITH DIFFERENTIAL/PLATELET
BASOS ABS: 0 10*3/uL (ref 0.0–0.2)
Basos: 0 %
EOS (ABSOLUTE): 0.2 10*3/uL (ref 0.0–0.4)
Eos: 2 %
Hematocrit: 39.9 % (ref 37.5–51.0)
Hemoglobin: 12.6 g/dL (ref 12.6–17.7)
Immature Grans (Abs): 0 10*3/uL (ref 0.0–0.1)
Immature Granulocytes: 0 %
LYMPHS ABS: 1.7 10*3/uL (ref 0.7–3.1)
LYMPHS: 22 %
MCH: 23.2 pg — AB (ref 26.6–33.0)
MCHC: 31.6 g/dL (ref 31.5–35.7)
MCV: 73 fL — ABNORMAL LOW (ref 79–97)
MONOCYTES: 9 %
Monocytes Absolute: 0.6 10*3/uL (ref 0.1–0.9)
NEUTROS ABS: 4.9 10*3/uL (ref 1.4–7.0)
Neutrophils: 67 %
PLATELETS: 407 10*3/uL — AB (ref 150–379)
RBC: 5.44 x10E6/uL (ref 4.14–5.80)
RDW: 15.7 % — ABNORMAL HIGH (ref 12.3–15.4)
WBC: 7.4 10*3/uL (ref 3.4–10.8)

## 2015-06-01 LAB — LIPID PANEL
CHOL/HDL RATIO: 4.1 ratio (ref 0.0–5.0)
CHOLESTEROL TOTAL: 165 mg/dL (ref 100–199)
HDL: 40 mg/dL (ref >39)
LDL Calculated: 109 mg/dL — ABNORMAL HIGH (ref 0–99)
TRIGLYCERIDES: 79 mg/dL (ref 0–149)
VLDL Cholesterol Cal: 16 mg/dL (ref 5–40)

## 2015-06-01 LAB — HIV ANTIBODY (ROUTINE TESTING W REFLEX): HIV Screen 4th Generation wRfx: NONREACTIVE

## 2015-06-01 LAB — HEMOGLOBIN A1C
ESTIMATED AVERAGE GLUCOSE: 117 mg/dL
HEMOGLOBIN A1C: 5.7 % — AB (ref 4.8–5.6)

## 2015-07-16 DIAGNOSIS — N1330 Unspecified hydronephrosis: Secondary | ICD-10-CM | POA: Diagnosis not present

## 2015-07-16 DIAGNOSIS — Y33XXXD Other specified events, undetermined intent, subsequent encounter: Secondary | ICD-10-CM | POA: Diagnosis not present

## 2015-07-16 DIAGNOSIS — S3710XD Unspecified injury of ureter, subsequent encounter: Secondary | ICD-10-CM | POA: Diagnosis not present

## 2015-07-22 DIAGNOSIS — M6281 Muscle weakness (generalized): Secondary | ICD-10-CM | POA: Diagnosis not present

## 2015-07-25 ENCOUNTER — Other Ambulatory Visit: Payer: Self-pay

## 2015-07-25 DIAGNOSIS — R52 Pain, unspecified: Principal | ICD-10-CM

## 2015-07-25 DIAGNOSIS — G8929 Other chronic pain: Secondary | ICD-10-CM

## 2015-07-25 MED ORDER — HYDROCODONE-ACETAMINOPHEN 10-325 MG PO TABS: 1.0000 | ORAL_TABLET | Freq: Three times a day (TID) | ORAL | Status: DC | PRN

## 2015-07-26 ENCOUNTER — Ambulatory Visit: Payer: BLUE CROSS/BLUE SHIELD | Admitting: Family Medicine

## 2015-08-16 ENCOUNTER — Other Ambulatory Visit: Payer: Self-pay

## 2015-08-16 ENCOUNTER — Encounter (INDEPENDENT_AMBULATORY_CARE_PROVIDER_SITE_OTHER): Payer: Self-pay

## 2015-08-16 DIAGNOSIS — Z79899 Other long term (current) drug therapy: Secondary | ICD-10-CM | POA: Diagnosis not present

## 2015-08-16 DIAGNOSIS — R0600 Dyspnea, unspecified: Secondary | ICD-10-CM | POA: Diagnosis not present

## 2015-08-16 DIAGNOSIS — R5383 Other fatigue: Secondary | ICD-10-CM | POA: Diagnosis not present

## 2015-08-16 DIAGNOSIS — R05 Cough: Secondary | ICD-10-CM | POA: Diagnosis not present

## 2015-08-16 DIAGNOSIS — R0602 Shortness of breath: Secondary | ICD-10-CM | POA: Diagnosis not present

## 2015-08-16 DIAGNOSIS — Z8249 Family history of ischemic heart disease and other diseases of the circulatory system: Secondary | ICD-10-CM | POA: Diagnosis not present

## 2015-08-16 DIAGNOSIS — Z7982 Long term (current) use of aspirin: Secondary | ICD-10-CM | POA: Diagnosis not present

## 2015-08-16 DIAGNOSIS — I10 Essential (primary) hypertension: Secondary | ICD-10-CM | POA: Diagnosis not present

## 2015-08-16 MED ORDER — RANITIDINE HCL 150 MG PO TABS: 150.0000 mg | ORAL_TABLET | Freq: Two times a day (BID) | ORAL | Status: DC

## 2015-08-19 ENCOUNTER — Ambulatory Visit (INDEPENDENT_AMBULATORY_CARE_PROVIDER_SITE_OTHER): Payer: BLUE CROSS/BLUE SHIELD | Admitting: Family Medicine

## 2015-08-19 ENCOUNTER — Encounter: Payer: Self-pay | Admitting: Family Medicine

## 2015-08-19 VITALS — BP 130/80 | HR 81 | Temp 98.2°F | Resp 16 | Wt 232.0 lb

## 2015-08-19 DIAGNOSIS — R52 Pain, unspecified: Secondary | ICD-10-CM

## 2015-08-19 DIAGNOSIS — M25552 Pain in left hip: Secondary | ICD-10-CM

## 2015-08-19 DIAGNOSIS — T07 Unspecified multiple injuries: Secondary | ICD-10-CM

## 2015-08-19 DIAGNOSIS — T07XXXA Unspecified multiple injuries, initial encounter: Secondary | ICD-10-CM

## 2015-08-19 DIAGNOSIS — Z79899 Other long term (current) drug therapy: Secondary | ICD-10-CM | POA: Insufficient documentation

## 2015-08-19 DIAGNOSIS — Z79891 Long term (current) use of opiate analgesic: Secondary | ICD-10-CM

## 2015-08-19 DIAGNOSIS — G8929 Other chronic pain: Secondary | ICD-10-CM | POA: Insufficient documentation

## 2015-08-19 DIAGNOSIS — M25559 Pain in unspecified hip: Secondary | ICD-10-CM

## 2015-08-19 HISTORY — DX: Other chronic pain: G89.29

## 2015-08-19 HISTORY — DX: Other long term (current) drug therapy: Z79.899

## 2015-08-19 MED ORDER — HYDROCODONE-ACETAMINOPHEN 10-325 MG PO TABS: 1.0000 | ORAL_TABLET | Freq: Three times a day (TID) | ORAL | Status: DC | PRN

## 2015-08-19 NOTE — Assessment & Plan Note (Signed)
Continue pain medicine °

## 2015-08-19 NOTE — Assessment & Plan Note (Addendum)
Warned pt to never take with prescription pain medicine, alcohol, nerve pills, or sleeping pills; okay to take ibuprofen (but not until he's done with the prednisone); UDS ordered today per routine, nothing in particular raising suspicion for misuse or diversion

## 2015-08-19 NOTE — Patient Instructions (Addendum)
Don't take any ibuprofen while on the steriods Return in 3 months for medicine refills Try to limit saturated fats in your diet (bologna, hot dogs, barbeque, cheeseburgers, hamburgers, steak, bacon, sausage, cheese, etc.) and get more fresh fruits, vegetables, and whole grains

## 2015-08-19 NOTE — Assessment & Plan Note (Signed)
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
## 2015-08-21 DIAGNOSIS — M6281 Muscle weakness (generalized): Secondary | ICD-10-CM | POA: Diagnosis not present

## 2015-08-26 LAB — TOXASSURE SELECT 13 (MW), URINE

## 2015-08-28 ENCOUNTER — Encounter: Payer: Self-pay | Admitting: Family Medicine

## 2015-08-28 ENCOUNTER — Telehealth: Payer: Self-pay | Admitting: Family Medicine

## 2015-08-28 NOTE — Telephone Encounter (Signed)
Certainly, I'll be glad to write him a note Please ask him to read it before he leaves so I can change it if necessary; thank you
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
## 2015-08-29 ENCOUNTER — Encounter: Payer: Self-pay | Admitting: Family Medicine

## 2015-08-30 NOTE — Telephone Encounter (Signed)
Patient picked up the letter on 08-29-15

## 2015-09-23 ENCOUNTER — Telehealth: Payer: Self-pay | Admitting: Family Medicine

## 2015-09-23 MED ORDER — IBUPROFEN 800 MG PO TABS: 800.0000 mg | ORAL_TABLET | Freq: Three times a day (TID) | ORAL | Status: DC | PRN

## 2015-09-23 NOTE — Telephone Encounter (Signed)
Requesting refill on Tylenol 800mg . Please send to [facility]. Last seen 08-19-15 and next appt 11-19-15

## 2015-09-23 NOTE — Telephone Encounter (Signed)
I'm pretty sure he meant ibuprofen 800 mg which is on his med list Last creatinine Feb 2017 reviewed Rx approved

## 2015-09-23 NOTE — Telephone Encounter (Signed)
Patient informed prescription has been sent to pharmacy. 

## 2015-09-24 ENCOUNTER — Encounter (INDEPENDENT_AMBULATORY_CARE_PROVIDER_SITE_OTHER): Payer: Self-pay

## 2015-10-29 ENCOUNTER — Encounter: Payer: Self-pay | Admitting: Emergency Medicine

## 2015-10-29 ENCOUNTER — Emergency Department: Payer: BLUE CROSS/BLUE SHIELD

## 2015-10-29 ENCOUNTER — Emergency Department
Admission: EM | Admit: 2015-10-29 | Discharge: 2015-10-29 | Disposition: A | Discharge status: Home / Self Care | Payer: BLUE CROSS/BLUE SHIELD | Attending: Emergency Medicine | Admitting: Emergency Medicine

## 2015-10-29 DIAGNOSIS — I10 Essential (primary) hypertension: Secondary | ICD-10-CM | POA: Insufficient documentation

## 2015-10-29 DIAGNOSIS — R0602 Shortness of breath: Secondary | ICD-10-CM | POA: Diagnosis not present

## 2015-10-29 DIAGNOSIS — Z79899 Other long term (current) drug therapy: Secondary | ICD-10-CM | POA: Insufficient documentation

## 2015-10-29 DIAGNOSIS — Z87891 Personal history of nicotine dependence: Secondary | ICD-10-CM | POA: Insufficient documentation

## 2015-10-29 DIAGNOSIS — Z791 Long term (current) use of non-steroidal anti-inflammatories (NSAID): Secondary | ICD-10-CM | POA: Diagnosis not present

## 2015-10-29 DIAGNOSIS — Z7982 Long term (current) use of aspirin: Secondary | ICD-10-CM | POA: Diagnosis not present

## 2015-10-29 DIAGNOSIS — E785 Hyperlipidemia, unspecified: Secondary | ICD-10-CM | POA: Insufficient documentation

## 2015-10-29 DIAGNOSIS — R0789 Other chest pain: Secondary | ICD-10-CM | POA: Diagnosis not present

## 2015-10-29 DIAGNOSIS — K219 Gastro-esophageal reflux disease without esophagitis: Secondary | ICD-10-CM | POA: Diagnosis not present

## 2015-10-29 LAB — BASIC METABOLIC PANEL
ANION GAP: 8 (ref 5–15)
BUN: 14 mg/dL (ref 6–20)
CHLORIDE: 104 mmol/L (ref 101–111)
CO2: 26 mmol/L (ref 22–32)
Calcium: 8.8 mg/dL — ABNORMAL LOW (ref 8.9–10.3)
Creatinine, Ser: 0.92 mg/dL (ref 0.61–1.24)
GFR calc Af Amer: >60 mL/min (ref >60)
GLUCOSE: 102 mg/dL — AB (ref 65–99)
POTASSIUM: 3.3 mmol/L — AB (ref 3.5–5.1)
Sodium: 138 mmol/L (ref 135–145)

## 2015-10-29 LAB — TROPONIN I: Troponin I: <0.03 ng/mL (ref <0.03)

## 2015-10-29 LAB — CBC
HEMATOCRIT: 40.6 % (ref 40.0–52.0)
HEMOGLOBIN: 13.1 g/dL (ref 13.0–18.0)
MCH: 23.9 pg — ABNORMAL LOW (ref 26.0–34.0)
MCHC: 32.4 g/dL (ref 32.0–36.0)
MCV: 73.7 fL — AB (ref 80.0–100.0)
Platelets: 345 10*3/uL (ref 150–440)
RBC: 5.5 MIL/uL (ref 4.40–5.90)
RDW: 17.2 % — AB (ref 11.5–14.5)
WBC: 8.1 10*3/uL (ref 3.8–10.6)

## 2015-10-29 MED ORDER — OMEPRAZOLE MAGNESIUM 20 MG PO TBEC: 20.0000 mg | DELAYED_RELEASE_TABLET | Freq: Every day | ORAL | 1 refills | Status: DC

## 2015-10-29 MED ORDER — GI COCKTAIL ~~LOC~~
30.0000 mL | ORAL | Status: AC
  Administered 2015-10-29: 30 mL via ORAL
  Filled 2015-10-29: qty 30

## 2015-10-29 MED ORDER — FAMOTIDINE IN NACL 20-0.9 MG/50ML-% IV SOLN
20.0000 mg | Freq: Once | INTRAVENOUS | Status: AC
  Administered 2015-10-29: 20 mg via INTRAVENOUS
  Filled 2015-10-29: qty 50

## 2015-10-29 MED ORDER — METOCLOPRAMIDE HCL 10 MG PO TABS: 10.0000 mg | ORAL_TABLET | Freq: Three times a day (TID) | ORAL | 0 refills | Status: DC
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
## 2015-10-29 NOTE — ED Notes (Signed)
Patient transported to X-ray 

## 2015-10-29 NOTE — ED Provider Notes (Signed)
-----------------------------------------   8:24 AM on 10/29/2015 -----------------------------------------  Patient's repeat troponin is negative. Patient will be discharged home with cardiology follow-up. The patient is agreeable to this plan.   Harvest Dark, MD 10/29/15 [phone]

## 2015-11-15 ENCOUNTER — Encounter: Payer: Self-pay | Admitting: Family Medicine

## 2015-11-15 ENCOUNTER — Ambulatory Visit (INDEPENDENT_AMBULATORY_CARE_PROVIDER_SITE_OTHER): Payer: BLUE CROSS/BLUE SHIELD | Admitting: Family Medicine

## 2015-11-15 DIAGNOSIS — R7989 Other specified abnormal findings of blood chemistry: Secondary | ICD-10-CM

## 2015-11-15 DIAGNOSIS — R1011 Right upper quadrant pain: Secondary | ICD-10-CM | POA: Diagnosis not present

## 2015-11-15 DIAGNOSIS — G8929 Other chronic pain: Secondary | ICD-10-CM

## 2015-11-15 DIAGNOSIS — R718 Other abnormality of red blood cells: Secondary | ICD-10-CM | POA: Diagnosis not present

## 2015-11-15 DIAGNOSIS — R7303 Prediabetes: Secondary | ICD-10-CM | POA: Diagnosis not present

## 2015-11-15 DIAGNOSIS — M25552 Pain in left hip: Secondary | ICD-10-CM | POA: Diagnosis not present

## 2015-11-15 DIAGNOSIS — R52 Pain, unspecified: Secondary | ICD-10-CM | POA: Diagnosis not present

## 2015-11-15 DIAGNOSIS — D509 Iron deficiency anemia, unspecified: Secondary | ICD-10-CM | POA: Insufficient documentation

## 2015-11-15 DIAGNOSIS — Z79899 Other long term (current) drug therapy: Secondary | ICD-10-CM

## 2015-11-15 DIAGNOSIS — R945 Abnormal results of liver function studies: Secondary | ICD-10-CM

## 2015-11-15 LAB — COMPREHENSIVE METABOLIC PANEL
ALT: 35 U/L (ref 9–46)
AST: 19 U/L (ref 10–40)
Albumin: 4.1 g/dL (ref 3.6–5.1)
Alkaline Phosphatase: 160 U/L — ABNORMAL HIGH (ref 40–115)
BUN: 14 mg/dL (ref 7–25)
CO2: 25 mmol/L (ref 20–31)
Calcium: 9.1 mg/dL (ref 8.6–10.3)
Chloride: 102 mmol/L (ref 98–110)
Creat: 1.1 mg/dL (ref 0.60–1.35)
GLUCOSE: 85 mg/dL (ref 65–99)
POTASSIUM: 4.2 mmol/L (ref 3.5–5.3)
Sodium: 136 mmol/L (ref 135–146)
TOTAL PROTEIN: 7.8 g/dL (ref 6.1–8.1)
Total Bilirubin: 0.4 mg/dL (ref 0.2–1.2)

## 2015-11-15 LAB — CBC WITH DIFFERENTIAL/PLATELET
BASOS PCT: 1 %
Basophils Absolute: 62 cells/uL (ref 0–200)
EOS PCT: 2 %
Eosinophils Absolute: 124 cells/uL (ref 15–500)
HEMATOCRIT: 40.7 % (ref 38.5–50.0)
Hemoglobin: 12.9 g/dL — ABNORMAL LOW (ref 13.2–17.1)
Lymphocytes Relative: 30 %
Lymphs Abs: 1860 cells/uL (ref 850–3900)
MCH: 23.6 pg — ABNORMAL LOW (ref 27.0–33.0)
MCHC: 31.7 g/dL — ABNORMAL LOW (ref 32.0–36.0)
MCV: 74.5 fL — ABNORMAL LOW (ref 80.0–100.0)
MPV: 9.4 fL (ref 7.5–12.5)
Monocytes Absolute: 744 cells/uL (ref 200–950)
Monocytes Relative: 12 %
NEUTROS PCT: 55 %
Neutro Abs: 3410 cells/uL (ref 1500–7800)
PLATELETS: 381 10*3/uL (ref 140–400)
RBC: 5.46 MIL/uL (ref 4.20–5.80)
RDW: 16.3 % — AB (ref 11.0–15.0)
WBC: 6.2 10*3/uL (ref 3.8–10.8)

## 2015-11-15 LAB — LIPID PANEL
CHOL/HDL RATIO: 5.5 ratio — AB (ref <=5.0)
CHOLESTEROL: 197 mg/dL (ref 125–200)
HDL: 36 mg/dL — ABNORMAL LOW (ref >=40)
LDL Cholesterol: 130 mg/dL — ABNORMAL HIGH (ref <130)
Triglycerides: 155 mg/dL — ABNORMAL HIGH (ref <150)
VLDL: 31 mg/dL — ABNORMAL HIGH (ref <30)

## 2015-11-15 LAB — HEMOGLOBIN A1C
HEMOGLOBIN A1C: 5.4 % (ref <5.7)
MEAN PLASMA GLUCOSE: 108 mg/dL

## 2015-11-15 LAB — FERRITIN: FERRITIN: 77 ng/mL (ref 20–345)

## 2015-11-15 MED ORDER — HYDROCODONE-ACETAMINOPHEN 10-325 MG PO TABS: 0.5000 | ORAL_TABLET | ORAL | 0 refills | Status: DC | PRN

## 2015-11-15 MED ORDER — ECONAZOLE NITRATE 1 % EX CREA: TOPICAL_CREAM | Freq: Every day | CUTANEOUS | 0 refills | Status: DC

## 2015-11-15 NOTE — Assessment & Plan Note (Signed)
Get scan of the gallbladder and liver; avoid fried foods and fatty foods until we get scan results

## 2015-11-15 NOTE — Patient Instructions (Addendum)
Stop the ibuprofen FOR NOW Spread the hydrocodone out by taking just half of a pill every four hours instead of one whole pill every eight hours if needed FOR NOW Let's check out your gallbladder If you develop worsening pain or bloody vomit or blood in your stool, go to the ER right away Try the new cream in the groin Limit white bread, and potatoes and rice, etc Avoid sugary drinks Check out the information at familydoctor.org entitled "Nutrition for Weight Loss: What You Need to Know about Fad Diets" Try to lose between 1-2 pounds per week by taking in fewer calories and burning off more calories You can succeed by limiting portions, limiting foods dense in calories and fat, becoming more active, and drinking 8 glasses of water a day (64 ounces) Don't skip meals, especially breakfast, as skipping meals may alter your metabolism Do not use over-the-counter weight loss pills or gimmicks that claim rapid weight loss A healthy BMI (or body mass index) is between 18.5 and 24.9 You can calculate your ideal BMI at the [place] website [employer]fr

## 2015-11-15 NOTE — Assessment & Plan Note (Signed)
[place] website reviewed; no red flags, no concern for diversion; Rx written

## 2015-11-15 NOTE — Assessment & Plan Note (Signed)
Check A1c; explained dx; cut down on whites, work on weight loss
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
## 2015-11-15 NOTE — Assessment & Plan Note (Signed)
Check liver enzymes 

## 2015-11-15 NOTE — Assessment & Plan Note (Signed)
May be thalassemia, check hemoglobinopathy panel

## 2015-11-15 NOTE — Assessment & Plan Note (Signed)
Tolerating medicine pretty well; will split the medicine in half for more frequent dosing at lower level; avoid alcohol, sleeping pills, other pain pills, etc.

## 2015-11-19 ENCOUNTER — Ambulatory Visit
Admission: RE | Admit: 2015-11-19 | Discharge: 2015-11-19 | Disposition: A | Discharge status: Home / Self Care | Payer: BLUE CROSS/BLUE SHIELD | Source: Ambulatory Visit | Attending: Family Medicine | Admitting: Family Medicine

## 2015-11-19 ENCOUNTER — Ambulatory Visit: Payer: BLUE CROSS/BLUE SHIELD | Admitting: Family Medicine

## 2015-11-19 DIAGNOSIS — R1011 Right upper quadrant pain: Secondary | ICD-10-CM

## 2015-11-19 LAB — HEMOGLOBINOPATHY EVALUATION
HEMATOCRIT: 40.7 % (ref 38.5–50.0)
HEMOGLOBIN: 12.9 g/dL — AB (ref 13.2–17.1)
Hgb A2 Quant: 2.2 % (ref 1.8–3.5)
Hgb A: 96.8 % (ref >96.0)
Hgb F Quant: <1 % (ref <2.0)
MCH: 23.6 pg — AB (ref 27.0–33.0)
MCV: 74.5 fL — AB (ref 80.0–100.0)
RBC: 5.46 MIL/uL (ref 4.20–5.80)
RDW: 16.3 % — ABNORMAL HIGH (ref 11.0–15.0)

## 2015-11-22 LAB — ALKALINE PHOSPHATASE ISOENZYMES
Alkaline Phonsphatase: 124 U/L — ABNORMAL HIGH (ref 40–115)
BONE ISOENZYMES (ALP ISO): 19 % — AB (ref 28–66)
Intestinal Isoenzymes: 3 % (ref 1–24)
LIVER ISOENZYMES (ALP ISO): 78 % — AB (ref 25–69)

## 2015-11-24 ENCOUNTER — Encounter: Payer: Self-pay | Admitting: Family Medicine

## 2015-12-05 ENCOUNTER — Encounter
Admission: RE | Admit: 2015-12-05 | Discharge: 2015-12-05 | Disposition: A | Discharge status: Home / Self Care | Payer: BLUE CROSS/BLUE SHIELD | Source: Ambulatory Visit | Attending: Orthopedic Surgery | Admitting: Orthopedic Surgery

## 2015-12-05 DIAGNOSIS — Z01818 Encounter for other preprocedural examination: Secondary | ICD-10-CM | POA: Diagnosis not present

## 2015-12-05 DIAGNOSIS — M25552 Pain in left hip: Secondary | ICD-10-CM | POA: Diagnosis not present

## 2015-12-05 DIAGNOSIS — M795 Residual foreign body in soft tissue: Secondary | ICD-10-CM | POA: Insufficient documentation

## 2015-12-05 HISTORY — DX: Myoneural disorder, unspecified: G70.9

## 2015-12-05 HISTORY — DX: Abnormal results of liver function studies: R94.5

## 2015-12-05 HISTORY — DX: Unspecified asthma, uncomplicated: J45.909

## 2015-12-05 HISTORY — DX: Personal history of other mental and behavioral disorders: Z86.59

## 2015-12-05 HISTORY — DX: Cardiac arrhythmia, unspecified: I49.9

## 2015-12-05 HISTORY — DX: Other specified abnormal findings of blood chemistry: R79.89

## 2015-12-05 HISTORY — DX: Anemia, unspecified: D64.9

## 2015-12-05 HISTORY — DX: Family history of other specified conditions: Z84.89

## 2015-12-05 NOTE — Pre-Procedure Instructions (Signed)
NOTED IN 10/29/15 ED VISIT REFERRAL TO CARDIOLOGY MADE. PATIENT STATES HE DID NOT REMEMBER. CARDIAC CLEARANCE REQUEST TO CASE[place] AT DR [facility] AS INSTRUCTED BY DR [id number]. PATIENT AWARE

## 2015-12-05 NOTE — Patient Instructions (Signed)
  Your procedure is scheduled on: 12/12/15 Report to Day Surgery. [facility] SECOND FLOOR To find out your arrival time please call [phone] between 1PM - 3PM on 12/11/15 Remember: Instructions that are not followed completely may result in serious medical risk, up to and including death, or upon the discretion of your surgeon and anesthesiologist your surgery may need to be rescheduled.    _X___ 1. Do not eat food or drink liquids after midnight. No gum chewing or hard candies.     ___X_ 2. No Alcohol for 24 hours before or after surgery.   _X___ 3. Do Not Smoke For 24 Hours Prior to Your Surgery.   ____ 4. Bring all medications with you on the day of surgery if instructed.    __X__ 5. Notify your doctor if there is any change in your medical condition     (cold, fever, infections).       Do not wear jewelry, make-up, hairpins, clips or nail polish.  Do not wear lotions, powders, or perfumes. You may wear deodorant.  Do not shave 48 hours prior to surgery. Men may shave face and neck.  Do not bring valuables to the hospital.    [facility] is not responsible for any belongings or valuables.               Contacts, dentures or bridgework may not be worn into surgery.  Leave your suitcase in the car. After surgery it may be brought to your room.  For patients admitted to the hospital, discharge time is determined by your                treatment team.   Patients discharged the day of surgery will not be allowed to drive home.   Please read over the following fact sheets that you were given:   Surgical Site Infection Prevention   _X___ Take these medicines the morning of surgery with A SIP OF WATER:    1. AMLODIPINE  2. METOPROLOL  3. OMEPRAZOLE AT BEDTIME 12/11/15 AND AM SURGERY  4.  5.  6.  ____ Fleet Enema (as directed)   __X__ Use CHG Soap as directed  __X__ Use inhalers on the day of surgery  ____ Stop metformin 2 days prior to surgery    ____ Take 1/2 of usual  insulin dose the night before surgery and none on the morning of surgery.   _X__ Stop Coumadin/Plavix/aspirin on              AS INSTRUCTED BY DR [facility] OR CARDIOLOGY  ____ Stop Anti-inflammatories on    ____ Stop supplements until after surgery.    ____ Bring C-Pap to the hospital.    YOU MUST GET CARDIAC CLEARANCE PRIOR TO SURGERY. DR [facility] OFFICE WILL CONTACT YOU AND ARRANGE

## 2015-12-10 ENCOUNTER — Telehealth: Payer: Self-pay | Admitting: Family Medicine

## 2015-12-10 NOTE — Telephone Encounter (Signed)
I left message for patient, asked him to return my call Alk phos isoenzymes are back

## 2015-12-13 NOTE — Telephone Encounter (Signed)
I talked to pt about labs He does not recall anyone talking about his alk phos in the last 6-24 months; looks like coming from liver Come back in to see me in the next two weeks and we'll talk more, get more tests (I plan to order anti-smooth muscle antibodies, ANA, GGT, etc) In the meantime, limit alcohol, tylenol

## 2015-12-24 ENCOUNTER — Encounter: Payer: Self-pay | Admitting: Family Medicine

## 2015-12-24 ENCOUNTER — Ambulatory Visit (INDEPENDENT_AMBULATORY_CARE_PROVIDER_SITE_OTHER): Payer: BLUE CROSS/BLUE SHIELD | Admitting: Family Medicine

## 2015-12-24 VITALS — BP 122/80 | HR 81 | Temp 98.2°F | Wt 245.8 lb

## 2015-12-24 DIAGNOSIS — R945 Abnormal results of liver function studies: Principal | ICD-10-CM

## 2015-12-24 DIAGNOSIS — Z79899 Other long term (current) drug therapy: Secondary | ICD-10-CM | POA: Diagnosis not present

## 2015-12-24 DIAGNOSIS — R718 Other abnormality of red blood cells: Secondary | ICD-10-CM | POA: Diagnosis not present

## 2015-12-24 DIAGNOSIS — E785 Hyperlipidemia, unspecified: Secondary | ICD-10-CM | POA: Diagnosis not present

## 2015-12-24 DIAGNOSIS — R7989 Other specified abnormal findings of blood chemistry: Secondary | ICD-10-CM

## 2015-12-24 LAB — COMPLETE METABOLIC PANEL WITH GFR
ALT: 34 U/L (ref 9–46)
AST: 20 U/L (ref 10–40)
Albumin: 4.1 g/dL (ref 3.6–5.1)
Alkaline Phosphatase: 140 U/L — ABNORMAL HIGH (ref 40–115)
BILIRUBIN TOTAL: 0.3 mg/dL (ref 0.2–1.2)
BUN: 12 mg/dL (ref 7–25)
CHLORIDE: 102 mmol/L (ref 98–110)
CO2: 24 mmol/L (ref 20–31)
Calcium: 8.9 mg/dL (ref 8.6–10.3)
Creat: 1.08 mg/dL (ref 0.60–1.35)
GFR, EST NON AFRICAN AMERICAN: 86 mL/min (ref >=60)
GLUCOSE: 87 mg/dL (ref 65–99)
Potassium: 4.3 mmol/L (ref 3.5–5.3)
SODIUM: 136 mmol/L (ref 135–146)
TOTAL PROTEIN: 8 g/dL (ref 6.1–8.1)

## 2015-12-24 LAB — GAMMA GT: GGT: 55 U/L — AB (ref 7–51)

## 2015-12-24 MED ORDER — EZETIMIBE 10 MG PO TABS: 10.0000 mg | ORAL_TABLET | Freq: Every day | ORAL | 6 refills | Status: DC

## 2015-12-24 NOTE — Assessment & Plan Note (Signed)
Reviewed [place] web site for the last year; no red flags, no other prescribers; okay for future refills for 3 months
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
## 2015-12-24 NOTE — Assessment & Plan Note (Signed)
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

## 2015-12-24 NOTE — Patient Instructions (Addendum)
Try to limit saturated fats in your diet (bologna, hot dogs, barbeque, cheeseburgers, hamburgers, steak, bacon, sausage, cheese, etc.) and get more fresh fruits, vegetables, and whole grains  Stop atorvastatin Start zetia instead for cholesterol and just take in the morning  Please talk with your mother and see if you can find out what her liver issue is, as that may help [place] know what's going on with your liver  Read about milk thistle for liver health  Return the stool cards at your convenience soon We'll have you see the hematologist about your small pale red blood cells; I suspect that you have an inherited condition that is not dangerous

## 2015-12-24 NOTE — Assessment & Plan Note (Signed)
Stop statin and start zetia; avoid saturated fats

## 2015-12-24 NOTE — Assessment & Plan Note (Signed)
Notably, elev alk phos; interesting that mother also dealing with issue; check ASMA and ANA, also hepatitis labs

## 2015-12-25 LAB — HEPATITIS PANEL, ACUTE
HCV Ab: NEGATIVE
Hep A IgM: NONREACTIVE
Hep B C IgM: NONREACTIVE
Hepatitis B Surface Ag: NEGATIVE

## 2015-12-26 LAB — ANTI-SMOOTH MUSCLE ANTIBODY, IGG: SMOOTH MUSCLE AB: 29 U — AB (ref <20)

## 2015-12-26 LAB — ANA,IFA RA DIAG PNL W/RFLX TIT/PATN
ANA: NEGATIVE
Rhuematoid fact SerPl-aCnc: <14 IU/mL (ref <14)

## 2015-12-30 NOTE — Pre-Procedure Instructions (Signed)
SPOKE WITH DR CALLWOOD'S OFFICE. PATIENT CNL STRESS TEST FOR 12/26/15. LM FOR CASEY AT DR [facility]

## 2015-12-31 ENCOUNTER — Telehealth: Payer: Self-pay | Admitting: Family Medicine

## 2015-12-31 NOTE — Pre-Procedure Instructions (Signed)
Hope at dr Rudene Christians notified patient cnl echo/stress x 2 at dr Etta Quill

## 2015-12-31 NOTE — Telephone Encounter (Signed)
Pt said that he needs a little more explaination on his labs. Please call.

## 2016-01-02 ENCOUNTER — Encounter: Admission: RE | Payer: Self-pay | Source: Ambulatory Visit

## 2016-01-02 ENCOUNTER — Ambulatory Visit
Admission: RE | Admit: 2016-01-02 | Payer: BLUE CROSS/BLUE SHIELD | Source: Ambulatory Visit | Admitting: Orthopedic Surgery

## 2016-01-02 SURGERY — REMOVAL FOREIGN BODY EXTREMITY
Anesthesia: Choice | Laterality: Left
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
## 2016-01-16 ENCOUNTER — Encounter: Payer: Self-pay | Admitting: Oncology

## 2016-01-16 ENCOUNTER — Inpatient Hospital Stay: Payer: BLUE CROSS/BLUE SHIELD | Attending: Oncology | Admitting: Oncology

## 2016-01-16 ENCOUNTER — Inpatient Hospital Stay: Payer: BLUE CROSS/BLUE SHIELD

## 2016-01-16 VITALS — BP 133/83 | HR 77 | Temp 97.3°F | Resp 18 | Ht 71.0 in | Wt 261.0 lb

## 2016-01-16 DIAGNOSIS — I4891 Unspecified atrial fibrillation: Secondary | ICD-10-CM | POA: Diagnosis not present

## 2016-01-16 DIAGNOSIS — R718 Other abnormality of red blood cells: Secondary | ICD-10-CM | POA: Diagnosis not present

## 2016-01-16 DIAGNOSIS — E785 Hyperlipidemia, unspecified: Secondary | ICD-10-CM | POA: Diagnosis not present

## 2016-01-16 DIAGNOSIS — Z79899 Other long term (current) drug therapy: Secondary | ICD-10-CM | POA: Diagnosis not present

## 2016-01-16 DIAGNOSIS — I10 Essential (primary) hypertension: Secondary | ICD-10-CM | POA: Insufficient documentation

## 2016-01-16 LAB — CBC
HEMATOCRIT: 40.2 % (ref 40.0–52.0)
Hemoglobin: 12.8 g/dL — ABNORMAL LOW (ref 13.0–18.0)
MCH: 23.9 pg — AB (ref 26.0–34.0)
MCHC: 31.9 g/dL — ABNORMAL LOW (ref 32.0–36.0)
MCV: 74.7 fL — AB (ref 80.0–100.0)
Platelets: 414 10*3/uL (ref 150–440)
RBC: 5.38 MIL/uL (ref 4.40–5.90)
RDW: 17.1 % — ABNORMAL HIGH (ref 11.5–14.5)
WBC: 6.4 10*3/uL (ref 3.8–10.6)

## 2016-01-16 LAB — RETICULOCYTES
RBC.: 5.38 MIL/uL (ref 4.40–5.90)
Retic Count, Absolute: 69.9 K/uL (ref 19.0–183.0)
Retic Ct Pct: 1.3 % (ref 0.4–3.1)

## 2016-01-16 LAB — FERRITIN: FERRITIN: 66 ng/mL (ref 24–336)

## 2016-01-16 LAB — IRON AND TIBC
Iron: 28 ug/dL — ABNORMAL LOW (ref 45–182)
Saturation Ratios: 9 % — ABNORMAL LOW (ref 17.9–39.5)
TIBC: 325 ug/dL (ref 250–450)
UIBC: 297 ug/dL

## 2016-01-16 LAB — LACTATE DEHYDROGENASE: LDH: 152 U/L (ref 98–192)

## 2016-01-16 LAB — DAT, POLYSPECIFIC AHG (ARMC ONLY): Polyspecific AHG test: NEGATIVE

## 2016-01-16 LAB — VITAMIN B12: Vitamin B-12: 468 pg/mL (ref 180–914)

## 2016-01-16 LAB — FOLATE: Folate: 13.1 ng/mL

## 2016-01-16 NOTE — Progress Notes (Signed)
Patient here today as new evaluation regarding microcytosis.  Referred by [name].  Patient offers no complaints today except for bilateral leg pain.  Thigh pain in right leg.  Calf pain in left leg.

## 2016-01-17 LAB — HEMOGLOBINOPATHY EVALUATION
HGB A2 QUANT: 2.5 % (ref 0.7–3.1)
HGB C: 0 %
Hgb A: 97.5 % (ref 94.0–98.0)
Hgb F Quant: 0 % (ref 0.0–2.0)
Hgb S Quant: 0 %

## 2016-01-17 LAB — HAPTOGLOBIN: HAPTOGLOBIN: 272 mg/dL — AB (ref 34–200)

## 2016-01-21 ENCOUNTER — Telehealth: Payer: Self-pay | Admitting: Family Medicine

## 2016-01-21 NOTE — Telephone Encounter (Signed)
Pt wife asking for a refill on her husbands pain medication.

## 2016-01-31 ENCOUNTER — Telehealth: Payer: Self-pay | Admitting: Family Medicine

## 2016-01-31 DIAGNOSIS — G8929 Other chronic pain: Secondary | ICD-10-CM

## 2016-01-31 DIAGNOSIS — R52 Pain, unspecified: Principal | ICD-10-CM

## 2016-01-31 NOTE — Telephone Encounter (Signed)
Pt needs a refill on his Hydrocodone. Pt is out.

## 2016-02-03 ENCOUNTER — Other Ambulatory Visit: Payer: Self-pay

## 2016-02-03 DIAGNOSIS — R52 Pain, unspecified: Principal | ICD-10-CM

## 2016-02-03 DIAGNOSIS — G8929 Other chronic pain: Secondary | ICD-10-CM

## 2016-02-04 MED ORDER — RANITIDINE HCL 300 MG PO TABS: 300.0000 mg | ORAL_TABLET | Freq: Every day | ORAL | 5 refills | Status: DC

## 2016-02-04 MED ORDER — HYDROCODONE-ACETAMINOPHEN 10-325 MG PO TABS: 0.5000 | ORAL_TABLET | ORAL | 0 refills | Status: DC | PRN

## 2016-02-04 NOTE — Telephone Encounter (Signed)
I'm reducing his total monthly amount of hydrocodone, so we'll want 60 pills to last at least a month; I'm also going to stop the omeprazole; that drug has dangers associated with it, so please start ranitidine at night instead, and avoid triggers

## 2016-02-04 NOTE — Telephone Encounter (Signed)
Pt is completely out of hydrocodone and omeprazole. Please send to [facility] [address] And call shelia (wife) when ready.

## 2016-02-04 NOTE — Telephone Encounter (Signed)
Needs an appt to discuss the narcotic PPI not intended for long-term use

## 2016-02-05 NOTE — Telephone Encounter (Signed)
Notified pt regarding his RX refill. PT understood why he is receiving  a  the lesser amount of pills.

## 2016-02-05 NOTE — Telephone Encounter (Signed)
Pt.notified

## 2016-02-06 ENCOUNTER — Telehealth: Payer: Self-pay | Admitting: Family Medicine

## 2016-02-06 NOTE — Telephone Encounter (Signed)
I reviewed the note from the hematologist; he suggests we work up iron deficiency Please have the patient do a set of stools cards (x3) and return those to [place] ASAP Also, please make sure he isn't using a PPI any more (omeprazole, Prevacid, Nexium, Prilosec, etc) because those can interfere with iron absorption Thank you

## 2016-02-07 NOTE — Telephone Encounter (Signed)
Pt is concern about taking iron supplements with his medication.

## 2016-02-07 NOTE — Telephone Encounter (Signed)
Yes, I just prescribed him 300 mg ranitidine so we'll have him use that

## 2016-02-07 NOTE — Telephone Encounter (Signed)
He already picked up rx for pain med Talked about iron supplement Okay to return in 2 months for recheck labs

## 2016-02-07 NOTE — Telephone Encounter (Signed)
Patient notified, he does take PPI and will stop but states has acid reflux really bad is there anything else he can use?

## 2016-02-14 ENCOUNTER — Other Ambulatory Visit: Payer: Self-pay | Admitting: Family Medicine

## 2016-02-14 DIAGNOSIS — E611 Iron deficiency: Secondary | ICD-10-CM

## 2016-02-14 LAB — POC HEMOCCULT BLD/STL (HOME/3-CARD/SCREEN)
Card #3 Fecal Occult Blood, POC: NEGATIVE
FECAL OCCULT BLD: NEGATIVE
Fecal Occult Blood, POC: NEGATIVE

## 2016-02-17 ENCOUNTER — Ambulatory Visit: Payer: BLUE CROSS/BLUE SHIELD | Admitting: Family Medicine

## 2016-03-12 ENCOUNTER — Other Ambulatory Visit: Payer: Self-pay | Admitting: Family Medicine

## 2016-03-15 ENCOUNTER — Encounter: Payer: Self-pay | Admitting: Emergency Medicine

## 2016-03-15 ENCOUNTER — Emergency Department
Admission: EM | Admit: 2016-03-15 | Discharge: 2016-03-15 | Disposition: A | Discharge status: Home / Self Care | Payer: BLUE CROSS/BLUE SHIELD | Attending: Emergency Medicine | Admitting: Emergency Medicine

## 2016-03-15 DIAGNOSIS — Z79891 Long term (current) use of opiate analgesic: Secondary | ICD-10-CM | POA: Diagnosis not present

## 2016-03-15 DIAGNOSIS — R42 Dizziness and giddiness: Secondary | ICD-10-CM

## 2016-03-15 DIAGNOSIS — Z87891 Personal history of nicotine dependence: Secondary | ICD-10-CM | POA: Diagnosis not present

## 2016-03-15 DIAGNOSIS — I10 Essential (primary) hypertension: Secondary | ICD-10-CM | POA: Insufficient documentation

## 2016-03-15 DIAGNOSIS — Z79899 Other long term (current) drug therapy: Secondary | ICD-10-CM | POA: Diagnosis not present

## 2016-03-15 DIAGNOSIS — Z7982 Long term (current) use of aspirin: Secondary | ICD-10-CM | POA: Diagnosis not present

## 2016-03-15 DIAGNOSIS — J45909 Unspecified asthma, uncomplicated: Secondary | ICD-10-CM | POA: Insufficient documentation

## 2016-03-15 LAB — CBC
HCT: 42.1 % (ref 40.0–52.0)
Hemoglobin: 13.4 g/dL (ref 13.0–18.0)
MCH: 23.7 pg — ABNORMAL LOW (ref 26.0–34.0)
MCHC: 31.8 g/dL — ABNORMAL LOW (ref 32.0–36.0)
MCV: 74.5 fL — AB (ref 80.0–100.0)
PLATELETS: 401 10*3/uL (ref 150–440)
RBC: 5.65 MIL/uL (ref 4.40–5.90)
RDW: 16.2 % — AB (ref 11.5–14.5)
WBC: 7.8 10*3/uL (ref 3.8–10.6)

## 2016-03-15 LAB — BASIC METABOLIC PANEL
ANION GAP: 5 (ref 5–15)
BUN: 12 mg/dL (ref 6–20)
CALCIUM: 9.2 mg/dL (ref 8.9–10.3)
CO2: 28 mmol/L (ref 22–32)
Chloride: 104 mmol/L (ref 101–111)
Creatinine, Ser: 1.09 mg/dL (ref 0.61–1.24)
GLUCOSE: 101 mg/dL — AB (ref 65–99)
Potassium: 3.7 mmol/L (ref 3.5–5.1)
SODIUM: 137 mmol/L (ref 135–145)

## 2016-03-15 LAB — TROPONIN I

## 2016-03-15 NOTE — ED Triage Notes (Signed)
Pt says he was studying tonight and thought he'd check his blood pressure before going to bed; pressure 158/105 at home; headache earlier today but none currently; denies chest pain; currently bp 158/91
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
## 2016-03-15 NOTE — ED Notes (Signed)
Report from rebecca, rn. Introduced self to pt, call bell placed to right side.

## 2016-03-20 ENCOUNTER — Ambulatory Visit: Payer: BLUE CROSS/BLUE SHIELD | Admitting: Family Medicine

## 2016-03-20 ENCOUNTER — Other Ambulatory Visit: Payer: Self-pay | Admitting: Family Medicine

## 2016-03-20 DIAGNOSIS — I10 Essential (primary) hypertension: Secondary | ICD-10-CM

## 2016-03-23 ENCOUNTER — Ambulatory Visit (INDEPENDENT_AMBULATORY_CARE_PROVIDER_SITE_OTHER): Payer: BLUE CROSS/BLUE SHIELD | Admitting: Family Medicine

## 2016-03-23 ENCOUNTER — Encounter: Payer: Self-pay | Admitting: Family Medicine

## 2016-03-23 VITALS — BP 120/84 | HR 83 | Temp 98.1°F | Resp 14 | Wt 245.0 lb

## 2016-03-23 DIAGNOSIS — Z79899 Other long term (current) drug therapy: Secondary | ICD-10-CM

## 2016-03-23 DIAGNOSIS — M25552 Pain in left hip: Secondary | ICD-10-CM

## 2016-03-23 DIAGNOSIS — R52 Pain, unspecified: Secondary | ICD-10-CM

## 2016-03-23 DIAGNOSIS — R945 Abnormal results of liver function studies: Secondary | ICD-10-CM

## 2016-03-23 DIAGNOSIS — R7989 Other specified abnormal findings of blood chemistry: Secondary | ICD-10-CM | POA: Diagnosis not present

## 2016-03-23 DIAGNOSIS — R718 Other abnormality of red blood cells: Secondary | ICD-10-CM

## 2016-03-23 DIAGNOSIS — Z23 Encounter for immunization: Secondary | ICD-10-CM

## 2016-03-23 DIAGNOSIS — I10 Essential (primary) hypertension: Secondary | ICD-10-CM

## 2016-03-23 DIAGNOSIS — G8929 Other chronic pain: Secondary | ICD-10-CM | POA: Diagnosis not present

## 2016-03-23 MED ORDER — HYDROCODONE-ACETAMINOPHEN 10-325 MG PO TABS: 0.5000 | ORAL_TABLET | ORAL | 0 refills | Status: DC | PRN

## 2016-03-23 NOTE — Assessment & Plan Note (Signed)
Check alk phos again and smooth muscle antibody 
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
## 2016-03-23 NOTE — Patient Instructions (Addendum)
Let's get labs today Return in 3 months You received the flu shot today; it should protect you against the flu virus over the coming months; it will take about two weeks for antibodies to develop; do try to stay away from hospitals, nursing homes, and daycares during peak flu season; taking extra vitamin C daily during flu season may help you avoid getting sick

## 2016-03-23 NOTE — Assessment & Plan Note (Signed)
Taking hydrocodone chronically

## 2016-03-23 NOTE — Assessment & Plan Note (Signed)
On contract; two pills a day adequate for pain reflief

## 2016-03-24 ENCOUNTER — Other Ambulatory Visit: Payer: Self-pay

## 2016-03-24 DIAGNOSIS — I10 Essential (primary) hypertension: Secondary | ICD-10-CM

## 2016-03-24 LAB — HEPATIC FUNCTION PANEL
ALBUMIN: 4 g/dL (ref 3.6–5.1)
ALK PHOS: 119 U/L — AB (ref 40–115)
ALT: 41 U/L (ref 9–46)
AST: 22 U/L (ref 10–40)
BILIRUBIN TOTAL: 0.3 mg/dL (ref 0.2–1.2)
Bilirubin, Direct: 0 mg/dL (ref <=0.2)
Indirect Bilirubin: 0.3 mg/dL (ref 0.2–1.2)
TOTAL PROTEIN: 8.4 g/dL — AB (ref 6.1–8.1)

## 2016-03-24 LAB — ANTI-SMOOTH MUSCLE ANTIBODY, IGG: Smooth Muscle Ab: 23 U — ABNORMAL HIGH (ref <20)

## 2016-03-24 MED ORDER — AMLODIPINE BESYLATE 5 MG PO TABS: 5.0000 mg | ORAL_TABLET | Freq: Every day | ORAL | 3 refills | Status: DC

## 2016-03-24 MED ORDER — METOPROLOL SUCCINATE ER 25 MG PO TB24: 25.0000 mg | ORAL_TABLET | Freq: Every day | ORAL | 3 refills | Status: DC

## 2016-03-24 NOTE — Telephone Encounter (Signed)
One year approved

## 2016-03-25 LAB — ALKALINE PHOSPHATASE ISOENZYMES
ALKALINE PHOSPHATASE (ALP ISO): 140 U/L — AB (ref 40–115)
BONE ISOENZYMES (ALP ISO): 22 % — AB (ref 28–66)
INTESTINAL ISOENZYMES (ALP ISO): 6 % (ref 1–24)
LIVER ISOENZYMES (ALP ISO): 72 % — AB (ref 25–69)

## 2016-04-01 ENCOUNTER — Other Ambulatory Visit: Payer: Self-pay | Admitting: Family Medicine

## 2016-04-01 DIAGNOSIS — R748 Abnormal levels of other serum enzymes: Secondary | ICD-10-CM | POA: Insufficient documentation

## 2016-04-01 NOTE — Assessment & Plan Note (Signed)
Mild elevated, with fam hx of some sort of liver disease; will refer to GI just for evaluation, see if additional testing needed

## 2016-04-01 NOTE — Progress Notes (Signed)
Refer to GI 

## 2016-04-03 NOTE — Assessment & Plan Note (Signed)
[place] web site reviewed; no other prescribers; Rx approved

## 2016-04-03 NOTE — Assessment & Plan Note (Addendum)
Patient has seen hematologist; negative stool cards; iron deficiency per Dr. Gary Fleet note

## 2016-04-03 NOTE — Assessment & Plan Note (Signed)
Discussed things that impact blood pressure (stress, pain, sodium, sleep, etc); try DASH guidelines; monitor

## 2016-04-08 ENCOUNTER — Emergency Department
Admission: EM | Admit: 2016-04-08 | Discharge: 2016-04-08 | Disposition: A | Discharge status: Home / Self Care | Payer: BLUE CROSS/BLUE SHIELD | Attending: Emergency Medicine | Admitting: Emergency Medicine

## 2016-04-08 ENCOUNTER — Encounter: Payer: Self-pay | Admitting: Emergency Medicine

## 2016-04-08 DIAGNOSIS — I10 Essential (primary) hypertension: Secondary | ICD-10-CM | POA: Insufficient documentation

## 2016-04-08 DIAGNOSIS — Z87891 Personal history of nicotine dependence: Secondary | ICD-10-CM | POA: Insufficient documentation

## 2016-04-08 DIAGNOSIS — K122 Cellulitis and abscess of mouth: Secondary | ICD-10-CM | POA: Diagnosis not present

## 2016-04-08 DIAGNOSIS — Z7982 Long term (current) use of aspirin: Secondary | ICD-10-CM | POA: Diagnosis not present

## 2016-04-08 DIAGNOSIS — Z79899 Other long term (current) drug therapy: Secondary | ICD-10-CM | POA: Diagnosis not present

## 2016-04-08 DIAGNOSIS — J029 Acute pharyngitis, unspecified: Secondary | ICD-10-CM | POA: Diagnosis present

## 2016-04-08 DIAGNOSIS — J45909 Unspecified asthma, uncomplicated: Secondary | ICD-10-CM | POA: Insufficient documentation

## 2016-04-08 MED ORDER — AMOXICILLIN 500 MG PO CAPS: 500.0000 mg | ORAL_CAPSULE | Freq: Three times a day (TID) | ORAL | 0 refills | Status: DC

## 2016-04-08 MED ORDER — FIRST-DUKES MOUTHWASH MT SUSP: 10.0000 mL | Freq: Four times a day (QID) | OROMUCOSAL | 0 refills | Status: DC

## 2016-04-08 MED ORDER — METHYLPREDNISOLONE 4 MG PO TBPK: ORAL_TABLET | ORAL | 0 refills | Status: DC

## 2016-04-08 MED ORDER — PSEUDOEPH-BROMPHEN-DM 30-2-10 MG/5ML PO SYRP: 5.0000 mL | ORAL_SOLUTION | Freq: Four times a day (QID) | ORAL | 0 refills | Status: DC | PRN
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
## 2016-04-08 NOTE — ED Triage Notes (Signed)
Pt c/o sore throat and cough X 5 days. NAD in triage. Denies fevers

## 2016-04-08 NOTE — ED Notes (Signed)
See triage note  Sore throat with cough for about 4-5 days   Afebrile on arrival

## 2016-04-14 ENCOUNTER — Other Ambulatory Visit: Payer: Self-pay | Admitting: *Deleted

## 2016-04-18 ENCOUNTER — Emergency Department: Payer: BLUE CROSS/BLUE SHIELD

## 2016-04-18 ENCOUNTER — Encounter: Payer: Self-pay | Admitting: Emergency Medicine

## 2016-04-18 ENCOUNTER — Emergency Department
Admission: EM | Admit: 2016-04-18 | Discharge: 2016-04-18 | Disposition: A | Discharge status: Home / Self Care | Payer: BLUE CROSS/BLUE SHIELD | Attending: Emergency Medicine | Admitting: Emergency Medicine

## 2016-04-18 DIAGNOSIS — Z7982 Long term (current) use of aspirin: Secondary | ICD-10-CM | POA: Diagnosis not present

## 2016-04-18 DIAGNOSIS — R079 Chest pain, unspecified: Secondary | ICD-10-CM | POA: Diagnosis not present

## 2016-04-18 DIAGNOSIS — I4891 Unspecified atrial fibrillation: Secondary | ICD-10-CM | POA: Diagnosis not present

## 2016-04-18 DIAGNOSIS — J45909 Unspecified asthma, uncomplicated: Secondary | ICD-10-CM | POA: Insufficient documentation

## 2016-04-18 DIAGNOSIS — Z87891 Personal history of nicotine dependence: Secondary | ICD-10-CM | POA: Diagnosis not present

## 2016-04-18 DIAGNOSIS — R0602 Shortness of breath: Secondary | ICD-10-CM | POA: Diagnosis not present

## 2016-04-18 DIAGNOSIS — I10 Essential (primary) hypertension: Secondary | ICD-10-CM | POA: Insufficient documentation

## 2016-04-18 DIAGNOSIS — Z79899 Other long term (current) drug therapy: Secondary | ICD-10-CM | POA: Insufficient documentation

## 2016-04-18 DIAGNOSIS — I480 Paroxysmal atrial fibrillation: Secondary | ICD-10-CM | POA: Insufficient documentation

## 2016-04-18 DIAGNOSIS — R069 Unspecified abnormalities of breathing: Secondary | ICD-10-CM | POA: Diagnosis not present

## 2016-04-18 LAB — CBC
HCT: 40.5 % (ref 40.0–52.0)
HEMOGLOBIN: 13.3 g/dL (ref 13.0–18.0)
MCH: 23.6 pg — ABNORMAL LOW (ref 26.0–34.0)
MCHC: 32.7 g/dL (ref 32.0–36.0)
MCV: 72.1 fL — ABNORMAL LOW (ref 80.0–100.0)
PLATELETS: 380 10*3/uL (ref 150–440)
RBC: 5.62 MIL/uL (ref 4.40–5.90)
RDW: 16.6 % — ABNORMAL HIGH (ref 11.5–14.5)
WBC: 7.8 10*3/uL (ref 3.8–10.6)

## 2016-04-18 LAB — COMPREHENSIVE METABOLIC PANEL
ALK PHOS: 131 U/L — AB (ref 38–126)
ALT: 46 U/L (ref 17–63)
AST: 30 U/L (ref 15–41)
Albumin: 3.9 g/dL (ref 3.5–5.0)
Anion gap: 9 (ref 5–15)
BUN: 15 mg/dL (ref 6–20)
CALCIUM: 9.3 mg/dL (ref 8.9–10.3)
CHLORIDE: 106 mmol/L (ref 101–111)
CO2: 24 mmol/L (ref 22–32)
Creatinine, Ser: 1.12 mg/dL (ref 0.61–1.24)
GFR calc Af Amer: >60 mL/min (ref >60)
GFR calc non Af Amer: >60 mL/min (ref >60)
GLUCOSE: 118 mg/dL — AB (ref 65–99)
Potassium: 3.4 mmol/L — ABNORMAL LOW (ref 3.5–5.1)
SODIUM: 139 mmol/L (ref 135–145)
Total Bilirubin: 0.5 mg/dL (ref 0.3–1.2)
Total Protein: 8.8 g/dL — ABNORMAL HIGH (ref 6.5–8.1)

## 2016-04-18 LAB — TROPONIN I
Troponin I: <0.03 ng/mL (ref <0.03)
Troponin I: <0.03 ng/mL (ref <0.03)

## 2016-04-18 LAB — MAGNESIUM: Magnesium: 1.9 mg/dL (ref 1.7–2.4)

## 2016-04-18 MED ORDER — APIXABAN 5 MG PO TABS
5.0000 mg | ORAL_TABLET | Freq: Two times a day (BID) | ORAL | 0 refills | Status: DC
Start: 2016-04-18 — End: 2016-08-28

## 2016-04-18 MED ORDER — APIXABAN 5 MG PO TABS
5.0000 mg | ORAL_TABLET | Freq: Two times a day (BID) | ORAL | Status: DC
  Administered 2016-04-18: 5 mg via ORAL
  Filled 2016-04-18: qty 1

## 2016-04-18 MED ORDER — APIXABAN 5 MG PO TABS: 5.0000 mg | ORAL_TABLET | Freq: Two times a day (BID) | ORAL | Status: DC

## 2016-04-18 MED ORDER — DILTIAZEM HCL 60 MG PO TABS
60.0000 mg | ORAL_TABLET | Freq: Once | ORAL | Status: AC
  Administered 2016-04-18: 60 mg via ORAL
  Filled 2016-04-18: qty 1

## 2016-04-18 MED ORDER — METOPROLOL SUCCINATE ER 25 MG PO TB24: 50.0000 mg | ORAL_TABLET | Freq: Every day | ORAL | 0 refills | Status: DC

## 2016-04-18 NOTE — ED Triage Notes (Signed)
Pt stating that he felt "weird" when he woke up. Pt stating that when he stood up is when he felt his heart racing and SOB. Per EMS pt was initially in a-fi with rates of 160s-220s. Pt was given 20mg  Cardizem. Pt went to a better rate and then after coughing jumped back up to 180s. Pt then was given another 10mg  of Cardizem. Pt had 3 81mg  ASA at home. Pt also has received 230ml of NS . Pt is in the 110s at this time and stating no SOB or nausea

## 2016-04-18 NOTE — ED Notes (Signed)
Pt in NAD at this time and drinking water. Pt stating that he is feeling better. Pt on cardiac monitor and fluids are finishing from EMS per Dr. Pollyann Samples request.

## 2016-04-18 NOTE — ED Notes (Signed)
Pt given 2 more warm blankets. Pt is in NAD at this time.
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
## 2016-04-18 NOTE — ED Provider Notes (Signed)
[facility]  I accepted care from Dr. Dahlia Client ____________________________________________    LABS (pertinent positives/negatives)  Labs Reviewed  CBC - Abnormal; Notable for the following:       Result Value   MCV 72.1 (*)    MCH 23.6 (*)    RDW 16.6 (*)    All other components within normal limits  COMPREHENSIVE METABOLIC PANEL - Abnormal; Notable for the following:    Potassium 3.4 (*)    Glucose, Bld 118 (*)    Total Protein 8.8 (*)    Alkaline Phosphatase 131 (*)    All other components within normal limits  TROPONIN I  MAGNESIUM  TROPONIN I      ____________________________________________   INITIAL IMPRESSION / ASSESSMENT AND PLAN / ED COURSE   Pertinent labs & imaging results that were available during my care of the patient were reviewed by me and considered in my medical decision making (see chart for details).  Patient awaiting second troponin. Patient still in A. fib, rate controlled at this point after dose of IV by EMS and then by mouth diltiazem here in the emergency department.  Repeat troponin negative/reassuring.  No recurrent symptoms.  Patient himself does not know why he has not been on a blood thinner in terms of chronic A. fib, I suspect perhaps this is because maybe he had been only intermittently in A. fib.  He has not followed up with a cardiologist in quite some time.  I spoke with on-call cardiologist Dr. Nehemiah Massed, patient had previously seen Dr. Clayborn Bigness, and for the questionable duration/chronic A. fib it was recommended to start the patient on 5 mg twice daily Eloquis and follow-up on Monday, call the office. For rate control, patient will double his current metoprolol succinate dose from 25 mg to 50 mg.  CONSULTATIONS: Dr. Nehemiah Massed, cardiology.    Patient / Family / Caregiver informed of clinical course, medical decision-making process, and agree with plan.  I discussed return precautions, follow-up  instructions, and discharged instructions with patient and/or family.     ____________________________________________   FINAL CLINICAL IMPRESSION(S) / ED DIAGNOSES  Final diagnoses:  Atrial fibrillation with RVR ([place])        Lisa Roca, MD 04/18/16 [phone]

## 2016-04-18 NOTE — Discharge Instructions (Signed)
As we discussed your being started on blood thinner called Eloquis to help prevent blood clots/stroke while UR in the irregular heartbeat called A. fib.  Also, to help prevent the heart from racing, today take your 25 mg metoprolol (you had an additional tablet in the ER), and starting tomorrow take 50 mg metoprolol daily.  Call the cardiologist office Monday for appointment.  Return to the emergency room for any return of heart racing, shortness of breath, chest pain, dizziness or passing out, or any other symptoms concerning to you.

## 2016-04-21 ENCOUNTER — Ambulatory Visit (INDEPENDENT_AMBULATORY_CARE_PROVIDER_SITE_OTHER): Payer: BLUE CROSS/BLUE SHIELD | Admitting: Gastroenterology

## 2016-04-21 ENCOUNTER — Encounter: Payer: Self-pay | Admitting: Gastroenterology

## 2016-04-21 ENCOUNTER — Other Ambulatory Visit
Admission: RE | Admit: 2016-04-21 | Discharge: 2016-04-21 | Disposition: A | Discharge status: Home / Self Care | Payer: BLUE CROSS/BLUE SHIELD | Source: Ambulatory Visit | Attending: Gastroenterology | Admitting: Gastroenterology

## 2016-04-21 VITALS — BP 128/82 | HR 67 | Temp 98.0°F | Ht 71.0 in | Wt 245.0 lb

## 2016-04-21 DIAGNOSIS — R7989 Other specified abnormal findings of blood chemistry: Secondary | ICD-10-CM | POA: Insufficient documentation

## 2016-04-21 DIAGNOSIS — D509 Iron deficiency anemia, unspecified: Secondary | ICD-10-CM

## 2016-04-21 DIAGNOSIS — R945 Abnormal results of liver function studies: Secondary | ICD-10-CM

## 2016-04-21 LAB — URINALYSIS, ROUTINE W REFLEX MICROSCOPIC
Bilirubin Urine: NEGATIVE
Glucose, UA: NEGATIVE mg/dL
HGB URINE DIPSTICK: NEGATIVE
Ketones, ur: NEGATIVE mg/dL
LEUKOCYTES UA: NEGATIVE
Nitrite: NEGATIVE
Protein, ur: NEGATIVE mg/dL
SPECIFIC GRAVITY, URINE: 1.016 (ref 1.005–1.030)
pH: 6 (ref 5.0–8.0)

## 2016-04-21 LAB — C-REACTIVE PROTEIN: CRP: 2.6 mg/dL — ABNORMAL HIGH (ref <1.0)
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
## 2016-04-21 NOTE — Patient Instructions (Signed)
FOLLOW UP IN 8 WEEKS WILL BE SCHEDULED FOR COLONOSCOPY/ENDOSCOPY TBD. AFTER CARDIAC CLEARENCE

## 2016-04-22 LAB — HEPATITIS C ANTIBODY

## 2016-04-22 LAB — MITOCHONDRIAL ANTIBODIES: Mitochondrial M2 Ab, IgG: 6.6 Units (ref 0.0–20.0)

## 2016-04-22 LAB — CERULOPLASMIN: Ceruloplasmin: 39.1 mg/dL — ABNORMAL HIGH (ref 16.0–31.0)

## 2016-04-22 LAB — HIV ANTIBODY (ROUTINE TESTING W REFLEX): HIV Screen 4th Generation wRfx: NONREACTIVE

## 2016-04-22 LAB — ANA W/REFLEX: Anti Nuclear Antibody(ANA): NEGATIVE

## 2016-04-22 LAB — ANTI-SMOOTH MUSCLE ANTIBODY, IGG: F-ACTIN AB IGG: 27 U — AB (ref 0–19)

## 2016-04-22 LAB — HEPATITIS B SURFACE ANTIGEN: HEP B S AG: NEGATIVE

## 2016-04-24 LAB — CELIAC DISEASE PANEL
Endomysial Ab, IgA: NEGATIVE
IGA: 318 mg/dL (ref 90–386)
Tissue Transglutaminase Ab, IgA: <2 U/mL (ref 0–3)

## 2016-04-24 LAB — ALPHA-1 ANTITRYPSIN PHENOTYPE: A-1 Antitrypsin, Ser: 173 mg/dL (ref 90–200)

## 2016-04-27 ENCOUNTER — Other Ambulatory Visit: Payer: Self-pay

## 2016-05-04 ENCOUNTER — Other Ambulatory Visit: Payer: Self-pay

## 2016-05-04 DIAGNOSIS — Z01818 Encounter for other preprocedural examination: Secondary | ICD-10-CM

## 2016-05-06 ENCOUNTER — Ambulatory Visit
Admission: RE | Admit: 2016-05-06 | Discharge: 2016-05-06 | Disposition: A | Discharge status: Home / Self Care | Payer: BLUE CROSS/BLUE SHIELD | Source: Ambulatory Visit | Attending: Gastroenterology | Admitting: Gastroenterology

## 2016-05-06 ENCOUNTER — Other Ambulatory Visit: Payer: Self-pay | Admitting: Gastroenterology

## 2016-05-06 DIAGNOSIS — Z01818 Encounter for other preprocedural examination: Secondary | ICD-10-CM

## 2016-05-06 DIAGNOSIS — R935 Abnormal findings on diagnostic imaging of other abdominal regions, including retroperitoneum: Secondary | ICD-10-CM | POA: Diagnosis not present

## 2016-05-06 DIAGNOSIS — R945 Abnormal results of liver function studies: Secondary | ICD-10-CM | POA: Diagnosis not present

## 2016-05-06 DIAGNOSIS — S3799XA Other injury of unspecified urinary and pelvic organ, initial encounter: Secondary | ICD-10-CM | POA: Insufficient documentation

## 2016-05-06 DIAGNOSIS — R7989 Other specified abnormal findings of blood chemistry: Secondary | ICD-10-CM

## 2016-05-06 MED ORDER — GADOBENATE DIMEGLUMINE 529 MG/ML IV SOLN
20.0000 mL | Freq: Once | INTRAVENOUS | Status: AC | PRN
  Administered 2016-05-06: 20 mL via INTRAVENOUS

## 2016-05-10 ENCOUNTER — Emergency Department: Payer: BLUE CROSS/BLUE SHIELD

## 2016-05-10 ENCOUNTER — Emergency Department
Admission: EM | Admit: 2016-05-10 | Discharge: 2016-05-10 | Disposition: A | Discharge status: Home / Self Care | Payer: BLUE CROSS/BLUE SHIELD | Attending: Emergency Medicine | Admitting: Emergency Medicine

## 2016-05-10 DIAGNOSIS — R079 Chest pain, unspecified: Secondary | ICD-10-CM | POA: Diagnosis not present

## 2016-05-10 DIAGNOSIS — R059 Cough, unspecified: Secondary | ICD-10-CM

## 2016-05-10 DIAGNOSIS — Z7982 Long term (current) use of aspirin: Secondary | ICD-10-CM | POA: Diagnosis not present

## 2016-05-10 DIAGNOSIS — R05 Cough: Secondary | ICD-10-CM | POA: Diagnosis not present

## 2016-05-10 DIAGNOSIS — R0789 Other chest pain: Secondary | ICD-10-CM | POA: Diagnosis not present

## 2016-05-10 DIAGNOSIS — Z79899 Other long term (current) drug therapy: Secondary | ICD-10-CM | POA: Diagnosis not present

## 2016-05-10 DIAGNOSIS — R0602 Shortness of breath: Secondary | ICD-10-CM | POA: Diagnosis not present

## 2016-05-10 DIAGNOSIS — Z87891 Personal history of nicotine dependence: Secondary | ICD-10-CM | POA: Insufficient documentation

## 2016-05-10 DIAGNOSIS — J45909 Unspecified asthma, uncomplicated: Secondary | ICD-10-CM | POA: Diagnosis not present

## 2016-05-10 DIAGNOSIS — I10 Essential (primary) hypertension: Secondary | ICD-10-CM | POA: Diagnosis not present

## 2016-05-10 LAB — CBC
HEMATOCRIT: 41.9 % (ref 40.0–52.0)
Hemoglobin: 13.1 g/dL (ref 13.0–18.0)
MCH: 22.7 pg — ABNORMAL LOW (ref 26.0–34.0)
MCHC: 31.3 g/dL — ABNORMAL LOW (ref 32.0–36.0)
MCV: 72.6 fL — AB (ref 80.0–100.0)
Platelets: 450 10*3/uL — ABNORMAL HIGH (ref 150–440)
RBC: 5.77 MIL/uL (ref 4.40–5.90)
RDW: 17.3 % — AB (ref 11.5–14.5)
WBC: 6.5 10*3/uL (ref 3.8–10.6)

## 2016-05-10 LAB — BASIC METABOLIC PANEL
Anion gap: 8 (ref 5–15)
BUN: 13 mg/dL (ref 6–20)
CHLORIDE: 103 mmol/L (ref 101–111)
CO2: 25 mmol/L (ref 22–32)
Calcium: 9.2 mg/dL (ref 8.9–10.3)
Creatinine, Ser: 1.01 mg/dL (ref 0.61–1.24)
GFR calc Af Amer: >60 mL/min (ref >60)
GFR calc non Af Amer: >60 mL/min (ref >60)
Glucose, Bld: 105 mg/dL — ABNORMAL HIGH (ref 65–99)
POTASSIUM: 3.8 mmol/L (ref 3.5–5.1)
SODIUM: 136 mmol/L (ref 135–145)

## 2016-05-10 LAB — TROPONIN I: Troponin I: <0.03 ng/mL (ref <0.03)

## 2016-05-10 MED ORDER — TRAMADOL HCL 50 MG PO TABS: 50.0000 mg | ORAL_TABLET | Freq: Four times a day (QID) | ORAL | 0 refills | Status: DC | PRN
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
## 2016-05-11 ENCOUNTER — Ambulatory Visit (INDEPENDENT_AMBULATORY_CARE_PROVIDER_SITE_OTHER): Payer: BLUE CROSS/BLUE SHIELD | Admitting: Family Medicine

## 2016-05-11 ENCOUNTER — Encounter: Payer: Self-pay | Admitting: Family Medicine

## 2016-05-11 DIAGNOSIS — R52 Pain, unspecified: Secondary | ICD-10-CM

## 2016-05-11 DIAGNOSIS — R0789 Other chest pain: Secondary | ICD-10-CM | POA: Diagnosis not present

## 2016-05-11 DIAGNOSIS — G8929 Other chronic pain: Secondary | ICD-10-CM | POA: Diagnosis not present

## 2016-05-11 DIAGNOSIS — R718 Other abnormality of red blood cells: Secondary | ICD-10-CM | POA: Diagnosis not present

## 2016-05-11 DIAGNOSIS — I480 Paroxysmal atrial fibrillation: Secondary | ICD-10-CM | POA: Diagnosis not present

## 2016-05-11 MED ORDER — SUCRALFATE 1 GM/10ML PO SUSP: 1.0000 g | Freq: Three times a day (TID) | ORAL | 0 refills | Status: DC

## 2016-05-11 MED ORDER — HYDROCODONE-ACETAMINOPHEN 10-325 MG PO TABS: 0.5000 | ORAL_TABLET | ORAL | 0 refills | Status: DC | PRN

## 2016-05-11 NOTE — Patient Instructions (Signed)
Start magnesium 250 mg daily Start the carafate; stop that 2 days prior to the upper endoscopy Avoid triggers Elevate head of the bed just a little Avoid eating 2-3 hours before bed Keep your appointments with your specialist
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
## 2016-05-12 DIAGNOSIS — I480 Paroxysmal atrial fibrillation: Secondary | ICD-10-CM | POA: Insufficient documentation

## 2016-05-12 DIAGNOSIS — R079 Chest pain, unspecified: Secondary | ICD-10-CM | POA: Insufficient documentation

## 2016-05-12 NOTE — Assessment & Plan Note (Signed)
Has seen hematologist; going to have EGD and colonoscopy soon

## 2016-05-12 NOTE — Assessment & Plan Note (Signed)
Followed by cardiologist; patient sounds to be in NSR today without ectopy; he is off of anticoagulation at cardiologist's behest

## 2016-05-12 NOTE — Assessment & Plan Note (Signed)
Patient reports relief with Mylanta; will try Carafate until he goes to see cardiology and GI; upcoming stress test, as well as EGD; avoid triggers; elevate HOB

## 2016-05-12 NOTE — Assessment & Plan Note (Signed)
I'm very glad he did not fill the tramadol Rx given in the ER; we discussed the agreement; he cannot get pain medicine or other controlled substances from outside, unless of course something like surgery, fracture, and then he must notify our office next business day; he agrees; [employer] site reviewed; no red flags; Rx for one month hydrocodone given

## 2016-05-13 DIAGNOSIS — M19079 Primary osteoarthritis, unspecified ankle and foot: Secondary | ICD-10-CM | POA: Diagnosis not present

## 2016-05-13 DIAGNOSIS — R011 Cardiac murmur, unspecified: Secondary | ICD-10-CM | POA: Diagnosis not present

## 2016-05-13 DIAGNOSIS — Z01818 Encounter for other preprocedural examination: Secondary | ICD-10-CM | POA: Diagnosis not present

## 2016-05-18 ENCOUNTER — Encounter: Payer: Self-pay | Admitting: *Deleted

## 2016-05-19 ENCOUNTER — Ambulatory Visit
Admission: RE | Admit: 2016-05-19 | Discharge: 2016-05-19 | Disposition: A | Discharge status: Home / Self Care | Payer: BLUE CROSS/BLUE SHIELD | Source: Ambulatory Visit | Attending: Gastroenterology | Admitting: Gastroenterology

## 2016-05-19 ENCOUNTER — Ambulatory Visit: Payer: BLUE CROSS/BLUE SHIELD | Admitting: Certified Registered Nurse Anesthetist

## 2016-05-19 ENCOUNTER — Encounter
Admission: RE | Disposition: A | Discharge status: Home / Self Care | Payer: Self-pay | Source: Ambulatory Visit | Attending: Gastroenterology

## 2016-05-19 ENCOUNTER — Encounter: Payer: Self-pay | Admitting: *Deleted

## 2016-05-19 DIAGNOSIS — K529 Noninfective gastroenteritis and colitis, unspecified: Secondary | ICD-10-CM | POA: Insufficient documentation

## 2016-05-19 DIAGNOSIS — Z7982 Long term (current) use of aspirin: Secondary | ICD-10-CM | POA: Insufficient documentation

## 2016-05-19 DIAGNOSIS — E785 Hyperlipidemia, unspecified: Secondary | ICD-10-CM | POA: Diagnosis not present

## 2016-05-19 DIAGNOSIS — Z79899 Other long term (current) drug therapy: Secondary | ICD-10-CM | POA: Diagnosis not present

## 2016-05-19 DIAGNOSIS — Z87891 Personal history of nicotine dependence: Secondary | ICD-10-CM | POA: Insufficient documentation

## 2016-05-19 DIAGNOSIS — J45909 Unspecified asthma, uncomplicated: Secondary | ICD-10-CM | POA: Diagnosis not present

## 2016-05-19 DIAGNOSIS — K633 Ulcer of intestine: Secondary | ICD-10-CM | POA: Diagnosis not present

## 2016-05-19 DIAGNOSIS — K648 Other hemorrhoids: Secondary | ICD-10-CM | POA: Diagnosis not present

## 2016-05-19 DIAGNOSIS — I10 Essential (primary) hypertension: Secondary | ICD-10-CM | POA: Diagnosis not present

## 2016-05-19 DIAGNOSIS — K640 First degree hemorrhoids: Secondary | ICD-10-CM

## 2016-05-19 DIAGNOSIS — K219 Gastro-esophageal reflux disease without esophagitis: Secondary | ICD-10-CM | POA: Insufficient documentation

## 2016-05-19 DIAGNOSIS — K3189 Other diseases of stomach and duodenum: Secondary | ICD-10-CM | POA: Insufficient documentation

## 2016-05-19 DIAGNOSIS — D509 Iron deficiency anemia, unspecified: Secondary | ICD-10-CM

## 2016-05-19 HISTORY — PX: ESOPHAGOGASTRODUODENOSCOPY (EGD) WITH PROPOFOL: SHX5813

## 2016-05-19 HISTORY — PX: COLONOSCOPY WITH PROPOFOL: SHX5780

## 2016-05-19 SURGERY — COLONOSCOPY WITH PROPOFOL
Anesthesia: General

## 2016-05-19 MED ORDER — PROPOFOL 10 MG/ML IV BOLUS
INTRAVENOUS | Status: AC
  Filled 2016-05-19: qty 20

## 2016-05-19 MED ORDER — PROPOFOL 500 MG/50ML IV EMUL
INTRAVENOUS | Status: DC | PRN
  Administered 2016-05-19: 150 ug/kg/min via INTRAVENOUS

## 2016-05-19 MED ORDER — FENTANYL CITRATE (PF) 100 MCG/2ML IJ SOLN: 25.0000 ug | INTRAMUSCULAR | Status: DC | PRN

## 2016-05-19 MED ORDER — PHENYLEPHRINE HCL 10 MG/ML IJ SOLN
INTRAMUSCULAR | Status: AC
  Filled 2016-05-19: qty 1

## 2016-05-19 MED ORDER — ONDANSETRON HCL 4 MG/2ML IJ SOLN: 4.0000 mg | Freq: Once | INTRAMUSCULAR | Status: DC | PRN

## 2016-05-19 MED ORDER — SODIUM CHLORIDE 0.9 % IJ SOLN
INTRAMUSCULAR | Status: AC
  Filled 2016-05-19: qty 10

## 2016-05-19 MED ORDER — MIDAZOLAM HCL 2 MG/2ML IJ SOLN
INTRAMUSCULAR | Status: AC
  Filled 2016-05-19: qty 2

## 2016-05-19 MED ORDER — PROPOFOL 500 MG/50ML IV EMUL
INTRAVENOUS | Status: AC
  Filled 2016-05-19: qty 50

## 2016-05-19 MED ORDER — LIDOCAINE HCL (CARDIAC) 20 MG/ML IV SOLN
INTRAVENOUS | Status: DC | PRN
  Administered 2016-05-19: 50 mg via INTRAVENOUS

## 2016-05-19 MED ORDER — SODIUM CHLORIDE 0.9 % IV SOLN
INTRAVENOUS | Status: DC
  Administered 2016-05-19: 1000 mL via INTRAVENOUS

## 2016-05-19 MED ORDER — MIDAZOLAM HCL 2 MG/2ML IJ SOLN
INTRAMUSCULAR | Status: DC | PRN
  Administered 2016-05-19: 2 mg via INTRAVENOUS

## 2016-05-19 MED ORDER — PROPOFOL 10 MG/ML IV BOLUS
INTRAVENOUS | Status: DC | PRN
  Administered 2016-05-19: 40 mg via INTRAVENOUS
  Administered 2016-05-19: 30 mg via INTRAVENOUS
  Administered 2016-05-19: 40 mg via INTRAVENOUS
  Administered 2016-05-19: 20 mg via INTRAVENOUS

## 2016-05-19 MED ORDER — LIDOCAINE HCL (PF) 2 % IJ SOLN
INTRAMUSCULAR | Status: AC
  Filled 2016-05-19: qty 2

## 2016-05-19 NOTE — Anesthesia Procedure Notes (Signed)
Date/Time: 05/19/2016 7:59 AM Performed by: Johnna Acosta Pre-anesthesia Checklist: Patient identified, Emergency Drugs available, Suction available, Patient being monitored and Timeout performed Patient Re-evaluated:Patient Re-evaluated prior to inductionOxygen Delivery Method: Nasal cannula

## 2016-05-19 NOTE — Anesthesia Preprocedure Evaluation (Addendum)
Anesthesia Evaluation  Patient identified by MRN, date of birth, ID band Patient awake    Reviewed: Allergy & Precautions, NPO status , Patient's Chart, lab work & pertinent test results, reviewed documented beta blocker date and time   Airway Mallampati: III  TM Distance: >3 FB     Dental  (+) Chipped   Pulmonary asthma , former smoker,    Pulmonary exam normal        Cardiovascular hypertension, Pt. on medications and Pt. on home beta blockers Normal cardiovascular exam+ dysrhythmias Atrial Fibrillation      Neuro/Psych negative psych ROS   GI/Hepatic Neg liver ROS, GERD  Medicated and Controlled,  Endo/Other  negative endocrine ROS  Renal/GU negative Renal ROS  negative genitourinary   Musculoskeletal   Abdominal Normal abdominal exam  (+)   Peds negative pediatric ROS (+)  Hematology  (+) anemia ,   Anesthesia Other Findings   Reproductive/Obstetrics                            Anesthesia Physical Anesthesia Plan  ASA: III  Anesthesia Plan: General   Post-op Pain Management:    Induction: Intravenous  Airway Management Planned: Nasal Cannula  Additional Equipment:   Intra-op Plan:   Post-operative Plan:   Informed Consent: I have reviewed the patients History and Physical, chart, labs and discussed the procedure including the risks, benefits and alternatives for the proposed anesthesia with the patient or authorized representative who has indicated his/her understanding and acceptance.   Dental advisory given  Plan Discussed with: CRNA and Surgeon  Anesthesia Plan Comments:         Anesthesia Quick Evaluation
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
## 2016-05-19 NOTE — Op Note (Signed)
[facility] Gastroenterology Patient Name: Dale Kennedy Procedure Date: 05/19/2016 7:47 AM MRN: [id number] Account #: [id number] Date of Birth: 1975-09-13 Admit Type: Outpatient Age: 41 Room: [facility] ENDO ROOM 1 Gender: Male Note Status: Finalized Procedure:            Upper GI endoscopy Indications:          Iron deficiency anemia Providers:            [name] MD, MD Referring MD:         Arnetha Courser (Referring MD) Medicines:            Monitored Anesthesia Care Complications:        No immediate complications. Procedure:            Pre-Anesthesia Assessment:                       - Prior to the procedure, a History and Physical was                        performed, and patient medications, allergies and                        sensitivities were reviewed. The patient's tolerance of                        previous anesthesia was reviewed.                       - The risks and benefits of the procedure and the                        sedation options and risks were discussed with the                        patient. All questions were answered and informed                        consent was obtained.                       - The risks and benefits of the procedure and the                        sedation options and risks were discussed with the                        patient. All questions were answered and informed                        consent was obtained.                       - ASA Grade Assessment: III - A patient with severe                        systemic disease.                       After obtaining informed consent, the endoscope was  passed under direct vision. Throughout the procedure,                        the patient's blood pressure, pulse, and oxygen                        saturations were monitored continuously. The Endoscope                        was introduced through the mouth, and advanced to the   third part of duodenum. The upper GI endoscopy was                        accomplished with ease. The patient tolerated the                        procedure well. Findings:      The esophagus was normal.      The stomach was normal.      The examined duodenum was normal. Biopsies for histology were taken with       a cold forceps for evaluation of celiac disease. Impression:           - Normal esophagus.                       - Normal stomach.                       - Normal examined duodenum. Biopsied. Recommendation:       - Await pathology results.                       - Perform a colonoscopy today. Procedure Code(s):    --- Professional ---                       [phone], Esophagogastroduodenoscopy, flexible, transoral;                        with biopsy, single or multiple Diagnosis Code(s):    --- Professional ---                       D50.9, Iron deficiency anemia, unspecified CPT copyright 2016 American Medical Association. All rights reserved. The codes documented in this report are preliminary and upon coder review may  be revised to meet current compliance requirements. [name], MD [name] MD, MD 05/19/2016 8:14:09 AM This report has been signed electronically. Number of Addenda: 0 Note Initiated On: 05/19/2016 7:47 AM      [facility]

## 2016-05-19 NOTE — Op Note (Signed)
[facility] Gastroenterology Patient Name: Dale Kennedy Procedure Date: 05/19/2016 7:20 AM MRN: [id number] Account #: [id number] Date of Birth: 14-Jun-1975 Admit Type: Outpatient Age: 41 Room: [name] ENDO ROOM 1 Gender: Male Note Status: Finalized Procedure:            Colonoscopy Indications:          Iron deficiency anemia Providers:            [name] MD, MD Referring MD:         Arnetha Courser (Referring MD) Medicines:            Monitored Anesthesia Care Complications:        No immediate complications. Procedure:            Pre-Anesthesia Assessment:                       - Prior to the procedure, a History and Physical was                        performed, and patient medications, allergies and                        sensitivities were reviewed. The patient's tolerance of                        previous anesthesia was reviewed.                       - The risks and benefits of the procedure and the                        sedation options and risks were discussed with the                        patient. All questions were answered and informed                        consent was obtained.                       - ASA Grade Assessment: III - A patient with severe                        systemic disease.                       After obtaining informed consent, the colonoscope was                        passed under direct vision. Throughout the procedure,                        the patient's blood pressure, pulse, and oxygen                        saturations were monitored continuously. The                        Colonoscope was introduced through the anus and                        advanced  to the the terminal ileum. The colonoscopy was                        performed with ease. The patient tolerated the                        procedure well. The quality of the bowel preparation                        was excellent. Findings:      The perianal and digital  rectal examinations were normal.      Non-bleeding internal hemorrhoids were found during retroflexion. The       hemorrhoids were medium-sized and Grade I (internal hemorrhoids that do       not prolapse).      The terminal ileum and ileocecal valve contained a few five mm ulcers.       No bleeding was present. No stigmata of recent bleeding were seen.       Biopsies were taken with a cold forceps for histology. , one hemostatic       clip was successfully placed on the IC valve after biopsy due to some       bleeding. There was no bleeding at the end of the procedure. I was able       to reintubate into the terminal ileum after placing of the clip easily.       OTHER      The exam was otherwise without abnormality on direct and retroflexion       views. Impression:           - Non-bleeding internal hemorrhoids.                       - A few ulcers in the terminal ileum and at the                        ileocecal valve. Biopsied. Clip was placed.                       - The examination was otherwise normal on direct and                        retroflexion views. Recommendation:       - Discharge patient to home (with escort).                       - Resume previous diet.                       - No NSAID use                       - Await pathology results.                       - Return to my office in 2 weeks. Procedure Code(s):    --- Professional ---                       [phone], Colonoscopy, flexible; with biopsy, single or                        multiple  Diagnosis Code(s):    --- Professional ---                       K63.3, Ulcer of intestine                       K64.0, First degree hemorrhoids                       D50.9, Iron deficiency anemia, unspecified CPT copyright 2016 American Medical Association. All rights reserved. The codes documented in this report are preliminary and upon coder review may  be revised to meet current compliance requirements. [name], MD [name] MD, MD 05/19/2016 8:39:49 AM This report has been signed electronically. Number of Addenda: 0 Note Initiated On: 05/19/2016 7:20 AM Scope Withdrawal Time: 0 hours 18 minutes 35 seconds  Total Procedure Duration: 0 hours 19 minutes 43 seconds       [facility]

## 2016-05-19 NOTE — Transfer of Care (Signed)
Immediate Anesthesia Transfer of Care Note  Patient: Dale Kennedy  Procedure(s) Performed: Procedure(s): COLONOSCOPY WITH PROPOFOL (N/A) ESOPHAGOGASTRODUODENOSCOPY (EGD) WITH PROPOFOL (N/A)  Patient Location: PACU  Anesthesia Type:General  Level of Consciousness: sedated  Airway & Oxygen Therapy: Patient Spontanous Breathing and Patient connected to nasal cannula oxygen  Post-op Assessment: Report given to RN and Post -op Vital signs reviewed and stable  Post vital signs: Reviewed and stable  Last Vitals:  Vitals:   05/19/16 0734  BP: 135/82  Pulse: 78  Resp: 17  Temp: 36.6 C    Last Pain:  Vitals:   05/19/16 0734  TempSrc: Tympanic  PainSc: 4          Complications: No apparent anesthesia complications

## 2016-05-19 NOTE — Anesthesia Post-op Follow-up Note (Cosign Needed)
Anesthesia QCDR form completed.        

## 2016-05-19 NOTE — Anesthesia Postprocedure Evaluation (Signed)
Anesthesia Post Note  Patient: Dale Kennedy  Procedure(s) Performed: Procedure(s) (LRB): COLONOSCOPY WITH PROPOFOL (N/A) ESOPHAGOGASTRODUODENOSCOPY (EGD) WITH PROPOFOL (N/A)  Patient location during evaluation: PACU Anesthesia Type: General Level of consciousness: awake and alert and oriented Pain management: pain level controlled Vital Signs Assessment: post-procedure vital signs reviewed and stable Respiratory status: spontaneous breathing Cardiovascular status: blood pressure returned to baseline Anesthetic complications: no     Last Vitals:  Vitals:   05/19/16 0900 05/19/16 0920  BP: 128/84 127/70  Pulse: 85 67  Resp: 15 (!) 24  Temp:      Last Pain:  Vitals:   05/19/16 0840  TempSrc: Tympanic  PainSc:                  Nolon Yellin

## 2016-05-20 ENCOUNTER — Other Ambulatory Visit: Payer: Self-pay | Admitting: Family Medicine

## 2016-05-20 ENCOUNTER — Encounter: Payer: Self-pay | Admitting: Gastroenterology

## 2016-05-20 LAB — SURGICAL PATHOLOGY

## 2016-05-20 MED ORDER — METOPROLOL SUCCINATE ER 50 MG PO TB24: 50.0000 mg | ORAL_TABLET | Freq: Every day | ORAL | 5 refills | Status: DC

## 2016-05-21 ENCOUNTER — Telehealth: Payer: Self-pay

## 2016-05-21 NOTE — Telephone Encounter (Signed)
-----   Message from [name], MD sent at 05/11/2016 12:26 PM EST ----- Normal

## 2016-05-21 NOTE — Telephone Encounter (Signed)
LVM for pt to return my call.

## 2016-05-21 NOTE — Telephone Encounter (Signed)
Pt notified of MRI results

## 2016-06-15 ENCOUNTER — Telehealth: Payer: Self-pay

## 2016-06-15 NOTE — Telephone Encounter (Signed)
Left msg for callback to schedule labs and appt.

## 2016-06-15 NOTE — Telephone Encounter (Signed)
-----   Message from [name], MD sent at 06/15/2016 10:03 AM EDT ----- Smooth muscle antibody is postiive- he needs a follow up to discuss results in a few weeks with repeat LFT's and GGT prior to visit

## 2016-06-17 ENCOUNTER — Other Ambulatory Visit
Admission: RE | Admit: 2016-06-17 | Discharge: 2016-06-17 | Disposition: A | Discharge status: Home / Self Care | Payer: BLUE CROSS/BLUE SHIELD | Source: Ambulatory Visit | Attending: Gastroenterology | Admitting: Gastroenterology

## 2016-06-17 ENCOUNTER — Telehealth: Payer: Self-pay

## 2016-06-17 ENCOUNTER — Encounter: Payer: Self-pay | Admitting: Gastroenterology

## 2016-06-17 ENCOUNTER — Ambulatory Visit (INDEPENDENT_AMBULATORY_CARE_PROVIDER_SITE_OTHER): Payer: BLUE CROSS/BLUE SHIELD | Admitting: Gastroenterology

## 2016-06-17 ENCOUNTER — Other Ambulatory Visit: Payer: Self-pay

## 2016-06-17 VITALS — BP 118/75 | HR 81 | Temp 98.3°F | Ht 71.0 in | Wt 242.2 lb

## 2016-06-17 DIAGNOSIS — K633 Ulcer of intestine: Secondary | ICD-10-CM

## 2016-06-17 DIAGNOSIS — R7989 Other specified abnormal findings of blood chemistry: Secondary | ICD-10-CM

## 2016-06-17 DIAGNOSIS — D509 Iron deficiency anemia, unspecified: Secondary | ICD-10-CM | POA: Diagnosis not present

## 2016-06-17 DIAGNOSIS — R945 Abnormal results of liver function studies: Secondary | ICD-10-CM

## 2016-06-17 LAB — HEPATIC FUNCTION PANEL
ALT: 42 U/L (ref 17–63)
AST: 25 U/L (ref 15–41)
Albumin: 3.9 g/dL (ref 3.5–5.0)
Alkaline Phosphatase: 125 U/L (ref 38–126)
Bilirubin, Direct: <0.1 mg/dL — ABNORMAL LOW (ref 0.1–0.5)
Total Bilirubin: 0.5 mg/dL (ref 0.3–1.2)
Total Protein: 9 g/dL — ABNORMAL HIGH (ref 6.5–8.1)

## 2016-06-17 NOTE — Telephone Encounter (Signed)
LVM for patient callback.   

## 2016-06-17 NOTE — Telephone Encounter (Signed)
-----   Message from [name], MD sent at 06/17/2016  2:42 PM EDT ----- Inform LFT's normal , recheck in 6 months

## 2016-06-17 NOTE — Progress Notes (Signed)
Primary Care Physician: Enid Derry, MD  Primary Gastroenterologist:  Dr. [name]   No chief complaint on file.   HPI: Dale Kennedy is a 41 y.o. male   He is here today for follow up .  Summary of history :  He was initially seen on 04/21/16 for abnormal LFT's, including a positive smooth muscle antibody . He also did have an iron deficiency anemia. Last 2 years has had an elevated alkaline phosphotase with normal transaminases .Fractionated components have been checked on two occasions show a mildly elevated liver component with normal bone components. GGT too has been mildly elevated in the past . RUQ USG shows no abnormality and normal bile ducts.  Smooth muscle antibody has been elevated at 23 and 29 on two occasions when checked.  Interval history 04/2016-06/2016  04/2016-MRCP normal  05/2016- Hb 13.1  HCV ab ,urine analysis , HIV ab ,celiac serology,HvsAgANA,AMA -negative Ceruloplasmin,A1AT - normal  F actin weakly positive 27  CRP elevated 2.6   05/2016  EGD- normal with duodenal biopsies  Colonoscopy -few ulcers seen on the ileo cecal valve - Normal TI biopsy IC valve showed mild acute inflammation -no chronic changes.   Denies use of any NSAID's . Takes iron tablets on and off .   No symptoms since last visit.    Current Outpatient Prescriptions  Medication Sig Dispense Refill  . amLODipine (NORVASC) 5 MG tablet Take 1 tablet (5 mg total) by mouth daily. 90 tablet 3  . apixaban (ELIQUIS) 5 MG TABS tablet Take 1 tablet (5 mg total) by mouth 2 (two) times daily. (Patient not taking: Reported on 05/11/2016) 60 tablet 0  . aspirin 81 MG tablet Take 81 mg by mouth daily.    . Diphenhyd-Hydrocort-Nystatin (FIRST-DUKES MOUTHWASH) SUSP Use as directed 10 mLs in the mouth or throat 4 (four) times daily. (Patient not taking: Reported on 05/11/2016) 237 mL 0  . econazole nitrate 1 % cream Apply topically daily. (Patient not taking: Reported on 05/11/2016) 15 g 0  . ezetimibe  (ZETIA) 10 MG tablet Take 1 tablet (10 mg total) by mouth daily. For cholesterol 30 tablet 6  . ferrous sulfate 325 (65 FE) MG tablet Take 1 tablet (325 mg total) by mouth daily with breakfast.    . HYDROcodone-acetaminophen (NORCO) 10-325 MG tablet Take 0.5 tablets by mouth every 4 (four) hours as needed. Sixty pills to last at least 30 days 60 tablet 0  . metoprolol succinate (TOPROL-XL) 50 MG 24 hr tablet Take 1 tablet (50 mg total) by mouth daily. Take with or immediately following a meal. 30 tablet 5  . ranitidine (ZANTAC) 300 MG tablet Take 1 tablet (300 mg total) by mouth at bedtime. For heartburn, reflux 30 tablet 5  . sucralfate (CARAFATE) 1 GM/10ML suspension Take 10 mLs (1 g total) by mouth 4 (four) times daily -  with meals and at bedtime. 420 mL 0  . VENTOLIN HFA 108 (90 Base) MCG/ACT inhaler inhale 2 puffs by mouth every 6 hours if needed for wheezing  0   No current facility-administered medications for this visit.     Allergies as of 06/17/2016  . (No Known Allergies)    ROS:  General: Negative for anorexia, weight loss, fever, chills, fatigue, weakness. ENT: Negative for hoarseness, difficulty swallowing , nasal congestion. CV: Negative for chest pain, angina, palpitations, dyspnea on exertion, peripheral edema.  Respiratory: Negative for dyspnea at rest, dyspnea on exertion, cough, sputum, wheezing.  GI: See history of  present illness. GU:  Negative for dysuria, hematuria, urinary incontinence, urinary frequency, nocturnal urination.  Endo: Negative for unusual weight change.    Physical Examination:   There were no vitals taken for this visit.  General: Well-nourished, well-developed in no acute distress.  Eyes: No icterus. Conjunctivae pink. Mouth: Oropharyngeal mucosa moist and pink , no lesions erythema or exudate. Lungs: Clear to auscultation bilaterally. Non-labored. Heart: Regular rate and rhythm, no murmurs rubs or gallops.  Abdomen: Bowel sounds are  normal, nontender, nondistended, no hepatosplenomegaly or masses, no abdominal bruits or hernia , no rebound or guarding.   Extremities: No lower extremity edema. No clubbing or deformities. Neuro: Alert and oriented x 3.  Grossly intact. Skin: Warm and dry, no jaundice.   Psych: Alert and cooperative, normal mood and affect.   Imaging Studies: No results found.  Assessment and Plan:    Dale Kennedy is a 41 y.o. y/o male has been referred for an elevated smooth muscle antibody and alkaline phosphatase .   1. Abnormal LFT's - I will monitor the alkaline phosphotase with LFT every 6 months if goes > 2 times the limit then will need a liver biopsy. So far MRCP and viral hepatitis tests are negative . ANA is also negative . There are a few rare genetic disorders associated with isolated elevation of alkaline phosphatase which require no intervention.   2. Iron deficiency anemia .  EGD-normal. The colonoscopy showed some ulcers on the IC valve , biopsies suggested acute nature which can be seen in NSAID related injury or the beginning of crohns disease . If he does have crohns disease of the small bowel can explain the iron deficiency anemia. I would suggest a SBFT to r/o strictures followed by a capsule study to evaluate the mucosa and r/o any ulcers   I have discussed alternative options, risks & benefits,  which include, but are not limited to retained capsule , need for subsequent surgery complication .   The patient agrees with this plan & written consent will be obtained.   Dr [name]  MD Follow up in 12 weeks

## 2016-06-19 ENCOUNTER — Other Ambulatory Visit: Payer: Self-pay

## 2016-06-22 ENCOUNTER — Ambulatory Visit: Payer: BLUE CROSS/BLUE SHIELD | Admitting: Family Medicine

## 2016-06-23 ENCOUNTER — Encounter: Payer: Self-pay | Admitting: Family Medicine

## 2016-06-23 ENCOUNTER — Ambulatory Visit (INDEPENDENT_AMBULATORY_CARE_PROVIDER_SITE_OTHER): Payer: BLUE CROSS/BLUE SHIELD | Admitting: Family Medicine

## 2016-06-23 DIAGNOSIS — G8929 Other chronic pain: Secondary | ICD-10-CM

## 2016-06-23 DIAGNOSIS — R778 Other specified abnormalities of plasma proteins: Secondary | ICD-10-CM | POA: Diagnosis not present

## 2016-06-23 DIAGNOSIS — R945 Abnormal results of liver function studies: Principal | ICD-10-CM

## 2016-06-23 DIAGNOSIS — I480 Paroxysmal atrial fibrillation: Secondary | ICD-10-CM

## 2016-06-23 DIAGNOSIS — R7303 Prediabetes: Secondary | ICD-10-CM

## 2016-06-23 DIAGNOSIS — R52 Pain, unspecified: Secondary | ICD-10-CM

## 2016-06-23 DIAGNOSIS — R7989 Other specified abnormal findings of blood chemistry: Secondary | ICD-10-CM

## 2016-06-23 DIAGNOSIS — I10 Essential (primary) hypertension: Secondary | ICD-10-CM

## 2016-06-23 DIAGNOSIS — M25552 Pain in left hip: Secondary | ICD-10-CM

## 2016-06-23 DIAGNOSIS — E782 Mixed hyperlipidemia: Secondary | ICD-10-CM | POA: Diagnosis not present

## 2016-06-23 LAB — LIPID PANEL
CHOL/HDL RATIO: 6.5 ratio — AB (ref <5.0)
Cholesterol: 213 mg/dL — ABNORMAL HIGH (ref <200)
HDL: 33 mg/dL — ABNORMAL LOW (ref >40)
LDL Cholesterol: 159 mg/dL — ABNORMAL HIGH (ref <100)
Triglycerides: 105 mg/dL (ref <150)
VLDL: 21 mg/dL (ref <30)

## 2016-06-23 MED ORDER — HYDROCODONE-ACETAMINOPHEN 10-325 MG PO TABS: 0.5000 | ORAL_TABLET | ORAL | 0 refills | Status: DC | PRN

## 2016-06-23 MED ORDER — METOPROLOL SUCCINATE ER 25 MG PO TB24: 25.0000 mg | ORAL_TABLET | Freq: Every day | ORAL | 3 refills | Status: DC

## 2016-06-23 NOTE — Assessment & Plan Note (Signed)
Reviewed recent labs; protein trending up

## 2016-06-23 NOTE — Assessment & Plan Note (Signed)
Seeing Dr. Clayborn Bigness; okay to reduce the beta-blocker since patient feeling light-headed and slowed down on the 50 mg daily, so we'll drop to 25 mg daily and call if any problems

## 2016-06-23 NOTE — Assessment & Plan Note (Signed)
Limit saturated fats; check cholesterol today

## 2016-06-23 NOTE — Patient Instructions (Addendum)
We'll get labs today Return in 3 months for next pain medicine visit Try to limit saturated fats in your diet (bologna, hot dogs, barbeque, cheeseburgers, hamburgers, steak, bacon, sausage, cheese, etc.) and get more fresh fruits, vegetables, and whole grains

## 2016-06-23 NOTE — Assessment & Plan Note (Signed)
Controlled; reducing beta-blocker; check at home and notify me if trending up; try to follow DASH guidelines

## 2016-06-23 NOTE — Assessment & Plan Note (Signed)
A1c was 5.7 a year ago, but last was normal
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
## 2016-06-23 NOTE — Assessment & Plan Note (Signed)
Managed by Dr. Vicente Males

## 2016-06-24 ENCOUNTER — Ambulatory Visit
Admission: RE | Admit: 2016-06-24 | Discharge: 2016-06-24 | Disposition: A | Discharge status: Home / Self Care | Payer: BLUE CROSS/BLUE SHIELD | Source: Ambulatory Visit | Attending: Gastroenterology | Admitting: Gastroenterology

## 2016-06-24 DIAGNOSIS — K633 Ulcer of intestine: Secondary | ICD-10-CM | POA: Diagnosis not present

## 2016-06-25 LAB — PROTEIN ELECTROPHORESIS, SERUM
ALBUMIN ELP: 3.5 g/dL — AB (ref 3.8–4.8)
Alpha-1-Globulin: 0.7 g/dL — ABNORMAL HIGH (ref 0.2–0.3)
Alpha-2-Globulin: 0.8 g/dL (ref 0.5–0.9)
BETA 2: 0.5 g/dL (ref 0.2–0.5)
Beta Globulin: 0.6 g/dL (ref 0.4–0.6)
Gamma Globulin: 2.1 g/dL — ABNORMAL HIGH (ref 0.8–1.7)
Total Protein, Serum Electrophoresis: 8.1 g/dL (ref 6.1–8.1)

## 2016-06-26 LAB — PROTEIN ELECTROPHORESIS,RANDOM URN
ALBUMIN UR 24 HR ELECTRO: 57.6 %
Alpha-1-Globulin, U: 29 %
Alpha-2-Globulin, U: 7.1 %
Beta Globulin, U: 3.2 %
Creatinine, Urine: 82 mg/dL (ref 20–370)
GAMMA GLOBULIN, U: 3.1 %
Protein Creatinine Ratio: 61 mg/g creat (ref 22–128)
Total Protein, Urine: 5 mg/dL (ref 5–25)

## 2016-07-01 ENCOUNTER — Encounter: Payer: Self-pay | Admitting: Family Medicine

## 2016-07-01 ENCOUNTER — Telehealth: Payer: Self-pay

## 2016-07-01 NOTE — Telephone Encounter (Signed)
-----   Message from [name], MD sent at 06/30/2016  4:53 PM EDT ----- Normal

## 2016-07-01 NOTE — Telephone Encounter (Signed)
Advised pt of results per Dr. Vicente Males.   Nxt appt 09/14/16

## 2016-07-03 ENCOUNTER — Encounter: Payer: Self-pay | Admitting: Family Medicine

## 2016-07-11 ENCOUNTER — Encounter: Payer: Self-pay | Admitting: Emergency Medicine

## 2016-07-11 ENCOUNTER — Emergency Department
Admission: EM | Admit: 2016-07-11 | Discharge: 2016-07-11 | Disposition: A | Discharge status: Home / Self Care | Payer: BLUE CROSS/BLUE SHIELD | Attending: Emergency Medicine | Admitting: Emergency Medicine

## 2016-07-11 ENCOUNTER — Emergency Department: Payer: BLUE CROSS/BLUE SHIELD

## 2016-07-11 DIAGNOSIS — I10 Essential (primary) hypertension: Secondary | ICD-10-CM | POA: Diagnosis not present

## 2016-07-11 DIAGNOSIS — J45909 Unspecified asthma, uncomplicated: Secondary | ICD-10-CM | POA: Diagnosis not present

## 2016-07-11 DIAGNOSIS — R0789 Other chest pain: Secondary | ICD-10-CM

## 2016-07-11 DIAGNOSIS — Z87891 Personal history of nicotine dependence: Secondary | ICD-10-CM | POA: Diagnosis not present

## 2016-07-11 DIAGNOSIS — Z7982 Long term (current) use of aspirin: Secondary | ICD-10-CM | POA: Diagnosis not present

## 2016-07-11 DIAGNOSIS — Z79899 Other long term (current) drug therapy: Secondary | ICD-10-CM | POA: Insufficient documentation

## 2016-07-11 DIAGNOSIS — R079 Chest pain, unspecified: Secondary | ICD-10-CM | POA: Diagnosis not present

## 2016-07-11 LAB — BASIC METABOLIC PANEL
Anion gap: 5 (ref 5–15)
BUN: 14 mg/dL (ref 6–20)
CHLORIDE: 102 mmol/L (ref 101–111)
CO2: 30 mmol/L (ref 22–32)
Calcium: 9.7 mg/dL (ref 8.9–10.3)
Creatinine, Ser: 1.14 mg/dL (ref 0.61–1.24)
GFR calc Af Amer: >60 mL/min (ref >60)
GFR calc non Af Amer: >60 mL/min (ref >60)
GLUCOSE: 112 mg/dL — AB (ref 65–99)
POTASSIUM: 4.2 mmol/L (ref 3.5–5.1)
Sodium: 137 mmol/L (ref 135–145)

## 2016-07-11 LAB — HEPATIC FUNCTION PANEL
ALBUMIN: 4 g/dL (ref 3.5–5.0)
ALT: 56 U/L (ref 17–63)
AST: 34 U/L (ref 15–41)
Alkaline Phosphatase: 140 U/L — ABNORMAL HIGH (ref 38–126)
BILIRUBIN TOTAL: 0.5 mg/dL (ref 0.3–1.2)
TOTAL PROTEIN: 9.3 g/dL — AB (ref 6.5–8.1)

## 2016-07-11 LAB — CBC
HEMATOCRIT: 41.7 % (ref 40.0–52.0)
Hemoglobin: 13.5 g/dL (ref 13.0–18.0)
MCH: 23.4 pg — AB (ref 26.0–34.0)
MCHC: 32.3 g/dL (ref 32.0–36.0)
MCV: 72.3 fL — AB (ref 80.0–100.0)
Platelets: 422 10*3/uL (ref 150–440)
RBC: 5.77 MIL/uL (ref 4.40–5.90)
RDW: 17.1 % — ABNORMAL HIGH (ref 11.5–14.5)
WBC: 9.3 10*3/uL (ref 3.8–10.6)

## 2016-07-11 LAB — LIPASE, BLOOD

## 2016-07-11 LAB — TROPONIN I: Troponin I: <0.03 ng/mL (ref <0.03)

## 2016-07-11 MED ORDER — ASPIRIN 81 MG PO CHEW
CHEWABLE_TABLET | ORAL | Status: AC
  Administered 2016-07-11: 243 mg via ORAL
  Filled 2016-07-11: qty 3

## 2016-07-11 MED ORDER — ASPIRIN 81 MG PO CHEW
243.0000 mg | CHEWABLE_TABLET | Freq: Once | ORAL | Status: AC
  Administered 2016-07-11: 243 mg via ORAL

## 2016-07-11 NOTE — Discharge Instructions (Signed)

## 2016-07-11 NOTE — ED Triage Notes (Signed)
Pt taken to tx rm via [place], report intermittent central chest pain x 2 days, denies accompanying sx.
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
## 2016-07-11 NOTE — ED Notes (Signed)
Patient transported to X-ray 

## 2016-07-14 DIAGNOSIS — Y33XXXD Other specified events, undetermined intent, subsequent encounter: Secondary | ICD-10-CM | POA: Diagnosis not present

## 2016-07-14 DIAGNOSIS — S3710XD Unspecified injury of ureter, subsequent encounter: Secondary | ICD-10-CM | POA: Diagnosis not present

## 2016-07-14 DIAGNOSIS — N1330 Unspecified hydronephrosis: Secondary | ICD-10-CM | POA: Diagnosis not present

## 2016-07-14 DIAGNOSIS — Z906 Acquired absence of other parts of urinary tract: Secondary | ICD-10-CM | POA: Diagnosis not present

## 2016-07-14 NOTE — Assessment & Plan Note (Signed)
On opiate therapy, monitored, prescribed here; [place] web site reviewed; no other prescribers, no red flags; may see him every three months for refills

## 2016-07-21 ENCOUNTER — Telehealth: Payer: Self-pay | Admitting: Family Medicine

## 2016-07-21 NOTE — Telephone Encounter (Signed)
Patient notifed

## 2016-07-21 NOTE — Telephone Encounter (Signed)
Please ask patient to read his MyChart note I do not want him on the statin I want him to try zetia instead

## 2016-08-28 ENCOUNTER — Ambulatory Visit (INDEPENDENT_AMBULATORY_CARE_PROVIDER_SITE_OTHER): Payer: BLUE CROSS/BLUE SHIELD | Admitting: Family Medicine

## 2016-08-28 ENCOUNTER — Encounter: Payer: Self-pay | Admitting: Family Medicine

## 2016-08-28 DIAGNOSIS — R52 Pain, unspecified: Secondary | ICD-10-CM | POA: Diagnosis not present

## 2016-08-28 DIAGNOSIS — E782 Mixed hyperlipidemia: Secondary | ICD-10-CM | POA: Diagnosis not present

## 2016-08-28 DIAGNOSIS — Z79891 Long term (current) use of opiate analgesic: Secondary | ICD-10-CM

## 2016-08-28 DIAGNOSIS — Z79899 Other long term (current) drug therapy: Secondary | ICD-10-CM | POA: Diagnosis not present

## 2016-08-28 DIAGNOSIS — R748 Abnormal levels of other serum enzymes: Secondary | ICD-10-CM

## 2016-08-28 DIAGNOSIS — R778 Other specified abnormalities of plasma proteins: Secondary | ICD-10-CM

## 2016-08-28 DIAGNOSIS — G8929 Other chronic pain: Secondary | ICD-10-CM

## 2016-08-28 DIAGNOSIS — I10 Essential (primary) hypertension: Secondary | ICD-10-CM

## 2016-08-28 MED ORDER — HYDROCODONE-ACETAMINOPHEN 10-325 MG PO TABS: 0.5000 | ORAL_TABLET | ORAL | 0 refills | Status: DC | PRN

## 2016-08-28 MED ORDER — METOPROLOL SUCCINATE ER 25 MG PO TB24: 37.5000 mg | ORAL_TABLET | Freq: Every day | ORAL | 6 refills | Status: DC

## 2016-08-28 NOTE — Assessment & Plan Note (Signed)
Well controlled 

## 2016-08-28 NOTE — Assessment & Plan Note (Signed)
Patient wishes to continue the zetia and recheck labs at next visit; try to limit saturated fats

## 2016-08-28 NOTE — Assessment & Plan Note (Signed)
Seen by GI

## 2016-08-28 NOTE — Telephone Encounter (Signed)
ERRENOUS °

## 2016-08-28 NOTE — Assessment & Plan Note (Signed)
Reviewed with patient, as he had labs done again in the ER in April; protein still climbing; he saw heme-onc in October; I sent a note to his specialist about the rising protein; I had already gotten UPEP and SPEP back in March; will see if specialist wants to see him, do additional testing
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
## 2016-08-28 NOTE — Patient Instructions (Addendum)
Change the metoprolol back to the 25 mg strength and take 1.5 tablets (37.5 mg) every day If you don't hear back in one week about the protein question, please call me Try to limit saturated fats in your diet (bologna, hot dogs, barbeque, cheeseburgers, hamburgers, steak, bacon, sausage, cheese, etc.) and get more fresh fruits, vegetables, and whole grains Return in 3 months and come fasting for labs
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
## 2016-08-28 NOTE — Assessment & Plan Note (Signed)
[place] web site reviewed over last 12 months; no red flags; UDS up-to-date and consistent; new contract signed today; refills provided; will see him every 3 months

## 2016-08-29 ENCOUNTER — Telehealth: Payer: Self-pay | Admitting: Family Medicine

## 2016-08-29 DIAGNOSIS — R778 Other specified abnormalities of plasma proteins: Secondary | ICD-10-CM

## 2016-08-29 NOTE — Telephone Encounter (Signed)
Please call patient The hematologist-oncologist suggested checking UPEP and SPEP again, so I've put in orders; if he can come by this week to get those done at [facility], that would be great Thank you

## 2016-08-29 NOTE — Assessment & Plan Note (Signed)
Check UPEP and SPEP, refer to heme-onc if positive

## 2016-09-01 NOTE — Telephone Encounter (Signed)
Patient notified

## 2016-09-14 ENCOUNTER — Ambulatory Visit: Payer: BLUE CROSS/BLUE SHIELD | Admitting: Gastroenterology

## 2016-09-15 DIAGNOSIS — R778 Other specified abnormalities of plasma proteins: Secondary | ICD-10-CM | POA: Diagnosis not present

## 2016-09-16 LAB — UPEP/UIFE/LIGHT CHAINS/TP, 24-HR UR
% BETA, URINE: 15.3 %
ALBUMIN, U: 53.3 %
ALPHA 1 URINE: 3.5 %
ALPHA-2-GLOBULIN, U: 10.3 %
Free Kappa Lt Chains,Ur: 167 mg/L — ABNORMAL HIGH (ref 1.35–24.19)
Free Lambda Lt Chains,Ur: 3.28 mg/L (ref 0.24–6.66)
GAMMA GLOBULIN URINE: 17.6 %
KAPPA/LAMBDA RATIO, U: 50.91 — AB (ref 2.04–10.37)
PROTEIN 24H UR: 287 mg/(24.h) — AB (ref 30–150)
PROTEIN UR: 35.9 mg/dL

## 2016-09-16 LAB — PROTEIN ELECTROPHORESIS, SERUM
A/G Ratio: 0.7 (ref 0.7–1.7)
ALBUMIN ELP: 3.4 g/dL (ref 2.9–4.4)
ALPHA 1: 0.3 g/dL (ref 0.0–0.4)
ALPHA 2: 0.8 g/dL (ref 0.4–1.0)
BETA: 1.3 g/dL (ref 0.7–1.3)
GAMMA GLOBULIN: 2.3 g/dL — AB (ref 0.4–1.8)
Globulin, Total: 4.7 g/dL — ABNORMAL HIGH (ref 2.2–3.9)
Total Protein: 8.1 g/dL (ref 6.0–8.5)

## 2016-09-23 ENCOUNTER — Telehealth: Payer: Self-pay

## 2016-09-23 ENCOUNTER — Other Ambulatory Visit: Payer: Self-pay

## 2016-09-23 ENCOUNTER — Ambulatory Visit: Payer: BLUE CROSS/BLUE SHIELD | Admitting: Family Medicine

## 2016-09-23 DIAGNOSIS — R945 Abnormal results of liver function studies: Secondary | ICD-10-CM

## 2016-09-23 DIAGNOSIS — R7989 Other specified abnormal findings of blood chemistry: Secondary | ICD-10-CM

## 2016-09-23 DIAGNOSIS — D509 Iron deficiency anemia, unspecified: Secondary | ICD-10-CM

## 2016-09-23 NOTE — Telephone Encounter (Signed)
Scheduled patient's [place] Capsule Study.   Advised patient.   Purchase a 238 gram bottle of Miralax from the laxative section of your drug store, and a 32 ounce bottle of Gatorade (NOT RED).  If a smoker, STOP SMOKING  Start clear liquid diet, AFTER your lunch meal (12:00pm) Do not eat any solid foods, including such foods as: cereal, oatmeal, yogurt, fruits, vegetables, creamed soups, eggs, bread, etc.   After 8:00pm, nothing to eat or drink EXCEPT medications, which may be taken with a sip of water.

## 2016-09-25 ENCOUNTER — Encounter
Admission: RE | Disposition: A | Discharge status: Home / Self Care | Payer: Self-pay | Source: Ambulatory Visit | Attending: Gastroenterology

## 2016-09-25 ENCOUNTER — Ambulatory Visit
Admission: RE | Admit: 2016-09-25 | Discharge: 2016-09-25 | Disposition: A | Discharge status: Home / Self Care | Payer: BLUE CROSS/BLUE SHIELD | Source: Ambulatory Visit | Attending: Gastroenterology | Admitting: Gastroenterology

## 2016-09-25 DIAGNOSIS — D509 Iron deficiency anemia, unspecified: Secondary | ICD-10-CM | POA: Insufficient documentation

## 2016-09-25 HISTORY — PX: GIVENS CAPSULE STUDY: SHX5432

## 2016-09-25 SURGERY — IMAGING PROCEDURE, GI TRACT, INTRALUMINAL, VIA CAPSULE
Anesthesia: General

## 2016-09-28 ENCOUNTER — Encounter: Payer: Self-pay | Admitting: Gastroenterology

## 2016-10-01 DIAGNOSIS — D509 Iron deficiency anemia, unspecified: Secondary | ICD-10-CM | POA: Diagnosis not present

## 2016-10-06 ENCOUNTER — Telehealth: Payer: Self-pay | Admitting: Gastroenterology

## 2016-10-06 NOTE — Telephone Encounter (Signed)
Copy was Given to Dale Kennedy on Friday for billing

## 2016-10-06 NOTE — Telephone Encounter (Signed)
Patient Dale Kennedy wanting the results of his capsule study.

## 2016-10-08 ENCOUNTER — Telehealth: Payer: Self-pay

## 2016-10-08 NOTE — Telephone Encounter (Signed)
Advised patient of results of capsule study: normal.  Pt requesting plan of action for elevated protein levels from Dr. Vicente Males.   Sent message to Dr. Clarisse Gouge response.   Advised patient I would callback with information upon receipt.

## 2016-10-09 NOTE — Telephone Encounter (Signed)
Next step to follow with hematology and see me back after so that I can explain what has been done so far and the results in detail

## 2016-10-13 ENCOUNTER — Telehealth: Payer: Self-pay

## 2016-10-13 DIAGNOSIS — R778 Other specified abnormalities of plasma proteins: Secondary | ICD-10-CM

## 2016-10-13 NOTE — Telephone Encounter (Signed)
Advised patient of follow-up plan per Dr. Vicente Males.   Next step to follow with hematology and see me back after so that I can explain what has been done so far and the results in detail  Sent referral to Dr. Janese Banks.

## 2016-10-14 ENCOUNTER — Ambulatory Visit (INDEPENDENT_AMBULATORY_CARE_PROVIDER_SITE_OTHER): Payer: BLUE CROSS/BLUE SHIELD | Admitting: Family Medicine

## 2016-10-14 ENCOUNTER — Encounter: Payer: Self-pay | Admitting: Family Medicine

## 2016-10-14 VITALS — BP 120/84 | HR 72 | Temp 98.3°F | Resp 16 | Ht 71.0 in | Wt 242.4 lb

## 2016-10-14 DIAGNOSIS — R55 Syncope and collapse: Secondary | ICD-10-CM

## 2016-10-14 LAB — COMPLETE METABOLIC PANEL WITH GFR
ALBUMIN: 4 g/dL (ref 3.6–5.1)
ALT: 25 U/L (ref 9–46)
AST: 19 U/L (ref 10–40)
Alkaline Phosphatase: 139 U/L — ABNORMAL HIGH (ref 40–115)
BILIRUBIN TOTAL: 0.3 mg/dL (ref 0.2–1.2)
BUN: 12 mg/dL (ref 7–25)
CO2: 23 mmol/L (ref 20–31)
Calcium: 9.4 mg/dL (ref 8.6–10.3)
Chloride: 103 mmol/L (ref 98–110)
Creat: 1.1 mg/dL (ref 0.60–1.35)
GFR, Est African American: >89 mL/min (ref >=60)
GFR, Est Non African American: 84 mL/min (ref >=60)
GLUCOSE: 102 mg/dL — AB (ref 65–99)
Potassium: 4.4 mmol/L (ref 3.5–5.3)
SODIUM: 137 mmol/L (ref 135–146)
TOTAL PROTEIN: 8.1 g/dL (ref 6.1–8.1)

## 2016-10-14 LAB — CBC WITH DIFFERENTIAL/PLATELET
BASOS ABS: 58 {cells}/uL (ref 0–200)
BASOS PCT: 1 %
Eosinophils Absolute: 58 cells/uL (ref 15–500)
Eosinophils Relative: 1 %
HCT: 42.5 % (ref 38.5–50.0)
HEMOGLOBIN: 12.9 g/dL — AB (ref 13.2–17.1)
LYMPHS ABS: 1334 {cells}/uL (ref 850–3900)
Lymphocytes Relative: 23 %
MCH: 22.7 pg — AB (ref 27.0–33.0)
MCHC: 30.4 g/dL — ABNORMAL LOW (ref 32.0–36.0)
MCV: 74.8 fL — ABNORMAL LOW (ref 80.0–100.0)
MONOS PCT: 11 %
MPV: 8.9 fL (ref 7.5–12.5)
Monocytes Absolute: 638 cells/uL (ref 200–950)
Neutro Abs: 3712 cells/uL (ref 1500–7800)
Neutrophils Relative %: 64 %
PLATELETS: 429 10*3/uL — AB (ref 140–400)
RBC: 5.68 MIL/uL (ref 4.20–5.80)
RDW: 17 % — ABNORMAL HIGH (ref 11.0–15.0)
WBC: 5.8 10*3/uL (ref 3.8–10.8)
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
## 2016-10-14 NOTE — Patient Instructions (Addendum)
Please call Dr. Vicente Males to schedule a follow up visit for as soon as possible. If Blood Pressure is above 140/90, Take 2 tablets of the 25mg  Metoprolol. If Blood Pressure is below 105/65, Call our office before taking your medication. If Blood Pressure is between these numbers, take your normal dose of 1.5 tablets. Please STOP Amlodipine. Please keep a log of your BP's and heart rate in the mornings, and how much medication you took.

## 2016-10-15 NOTE — Progress Notes (Signed)
Hello Mr. Crowson, I am writing to let you know that I have reviewed your lab results: Your CBC shows mild anemia, which I know is chronic for you, so this does not concern me. Your Metabolic panel shows slightly increased glucose (blood sugar) level and alkaline phosphatase level, both of which appear to be baseline for you.  None of these results are likely to be contributing to your light-headedness, so we will stick with our blood pressure management plan like we discussed yesterday and [name] will see you back in 2 weeks.  Please feel free to message me back or call the clinic with any questions you may have. Thank you so much, Raelyn Ensign NP-C

## 2016-10-22 ENCOUNTER — Encounter: Payer: Self-pay | Admitting: Emergency Medicine

## 2016-10-22 ENCOUNTER — Emergency Department: Payer: BLUE CROSS/BLUE SHIELD

## 2016-10-22 ENCOUNTER — Emergency Department
Admission: EM | Admit: 2016-10-22 | Discharge: 2016-10-22 | Disposition: A | Discharge status: Home / Self Care | Payer: BLUE CROSS/BLUE SHIELD | Attending: Emergency Medicine | Admitting: Emergency Medicine

## 2016-10-22 ENCOUNTER — Telehealth: Payer: Self-pay | Admitting: Family Medicine

## 2016-10-22 DIAGNOSIS — Z79899 Other long term (current) drug therapy: Secondary | ICD-10-CM | POA: Insufficient documentation

## 2016-10-22 DIAGNOSIS — R079 Chest pain, unspecified: Secondary | ICD-10-CM | POA: Diagnosis not present

## 2016-10-22 DIAGNOSIS — Z87891 Personal history of nicotine dependence: Secondary | ICD-10-CM | POA: Diagnosis not present

## 2016-10-22 DIAGNOSIS — Z7982 Long term (current) use of aspirin: Secondary | ICD-10-CM | POA: Diagnosis not present

## 2016-10-22 DIAGNOSIS — J45909 Unspecified asthma, uncomplicated: Secondary | ICD-10-CM | POA: Diagnosis not present

## 2016-10-22 DIAGNOSIS — I10 Essential (primary) hypertension: Secondary | ICD-10-CM | POA: Insufficient documentation

## 2016-10-22 LAB — BASIC METABOLIC PANEL
Anion gap: 8 (ref 5–15)
BUN: 14 mg/dL (ref 6–20)
CALCIUM: 9.1 mg/dL (ref 8.9–10.3)
CO2: 27 mmol/L (ref 22–32)
CREATININE: 1.28 mg/dL — AB (ref 0.61–1.24)
Chloride: 105 mmol/L (ref 101–111)
GFR calc non Af Amer: >60 mL/min (ref >60)
Glucose, Bld: 105 mg/dL — ABNORMAL HIGH (ref 65–99)
Potassium: 4.3 mmol/L (ref 3.5–5.1)
SODIUM: 140 mmol/L (ref 135–145)

## 2016-10-22 LAB — CBC
HCT: 40.2 % (ref 40.0–52.0)
Hemoglobin: 12.7 g/dL — ABNORMAL LOW (ref 13.0–18.0)
MCH: 22.5 pg — AB (ref 26.0–34.0)
MCHC: 31.6 g/dL — AB (ref 32.0–36.0)
MCV: 71.3 fL — ABNORMAL LOW (ref 80.0–100.0)
Platelets: 402 10*3/uL (ref 150–440)
RBC: 5.63 MIL/uL (ref 4.40–5.90)
RDW: 17.4 % — ABNORMAL HIGH (ref 11.5–14.5)
WBC: 7.7 10*3/uL (ref 3.8–10.6)

## 2016-10-22 LAB — TROPONIN I

## 2016-10-22 MED ORDER — METOPROLOL SUCCINATE ER 25 MG PO TB24: 37.5000 mg | ORAL_TABLET | Freq: Every day | ORAL | 11 refills | Status: DC

## 2016-10-22 MED ORDER — AMLODIPINE BESYLATE 2.5 MG PO TABS: 2.5000 mg | ORAL_TABLET | Freq: Every day | ORAL | 3 refills | Status: DC

## 2016-10-22 NOTE — ED Triage Notes (Signed)
Patient ambulatory to triage with steady gait, without difficulty or distress noted; Pt recently stopped amlodipine for HTN after taking 88yr; this evening noted his BP 149/107; pt reports tonight had an onset CP and into left shoulder but denies at present; also reports had some SHOB through out the day

## 2016-10-22 NOTE — Discharge Instructions (Signed)

## 2016-10-22 NOTE — Telephone Encounter (Signed)
I spoke with patient after reviewing the ER notes 125/80 now He's doing better He'll just continue the beta-blocker Don't fill the amlodipine unless needed (he'll get it if pressures start to go back up)
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
## 2016-10-26 ENCOUNTER — Inpatient Hospital Stay: Payer: BLUE CROSS/BLUE SHIELD | Admitting: Oncology

## 2016-10-26 ENCOUNTER — Telehealth: Payer: Self-pay

## 2016-10-26 NOTE — Telephone Encounter (Signed)
Per Dale Kennedy patient called to reschedule appt.

## 2016-10-27 ENCOUNTER — Inpatient Hospital Stay: Payer: BLUE CROSS/BLUE SHIELD | Attending: Oncology

## 2016-10-27 ENCOUNTER — Other Ambulatory Visit: Payer: Self-pay | Admitting: Oncology

## 2016-10-27 DIAGNOSIS — K219 Gastro-esophageal reflux disease without esophagitis: Secondary | ICD-10-CM | POA: Insufficient documentation

## 2016-10-27 DIAGNOSIS — Z7982 Long term (current) use of aspirin: Secondary | ICD-10-CM | POA: Diagnosis not present

## 2016-10-27 DIAGNOSIS — I4891 Unspecified atrial fibrillation: Secondary | ICD-10-CM | POA: Insufficient documentation

## 2016-10-27 DIAGNOSIS — I119 Hypertensive heart disease without heart failure: Secondary | ICD-10-CM | POA: Insufficient documentation

## 2016-10-27 DIAGNOSIS — D509 Iron deficiency anemia, unspecified: Secondary | ICD-10-CM | POA: Diagnosis not present

## 2016-10-27 DIAGNOSIS — J45909 Unspecified asthma, uncomplicated: Secondary | ICD-10-CM | POA: Diagnosis not present

## 2016-10-27 DIAGNOSIS — Z79899 Other long term (current) drug therapy: Secondary | ICD-10-CM | POA: Diagnosis not present

## 2016-10-27 DIAGNOSIS — D808 Other immunodeficiencies with predominantly antibody defects: Secondary | ICD-10-CM | POA: Insufficient documentation

## 2016-10-27 DIAGNOSIS — R718 Other abnormality of red blood cells: Secondary | ICD-10-CM

## 2016-10-27 DIAGNOSIS — Z87891 Personal history of nicotine dependence: Secondary | ICD-10-CM | POA: Diagnosis not present

## 2016-10-27 DIAGNOSIS — E785 Hyperlipidemia, unspecified: Secondary | ICD-10-CM | POA: Diagnosis not present

## 2016-10-27 LAB — COMPREHENSIVE METABOLIC PANEL
ALBUMIN: 3.8 g/dL (ref 3.5–5.0)
ALK PHOS: 114 U/L (ref 38–126)
ALT: 26 U/L (ref 17–63)
AST: 22 U/L (ref 15–41)
Anion gap: 9 (ref 5–15)
BILIRUBIN TOTAL: 0.4 mg/dL (ref 0.3–1.2)
BUN: 14 mg/dL (ref 6–20)
CALCIUM: 9 mg/dL (ref 8.9–10.3)
CO2: 23 mmol/L (ref 22–32)
Chloride: 102 mmol/L (ref 101–111)
Creatinine, Ser: 1.16 mg/dL (ref 0.61–1.24)
GFR calc Af Amer: >60 mL/min (ref >60)
GFR calc non Af Amer: >60 mL/min (ref >60)
GLUCOSE: 97 mg/dL (ref 65–99)
POTASSIUM: 4 mmol/L (ref 3.5–5.1)
Sodium: 134 mmol/L — ABNORMAL LOW (ref 135–145)
TOTAL PROTEIN: 8.3 g/dL — AB (ref 6.5–8.1)

## 2016-10-27 LAB — CBC WITH DIFFERENTIAL/PLATELET
BASOS ABS: 0.1 10*3/uL (ref 0–0.1)
BASOS PCT: 1 %
Eosinophils Absolute: 0.1 10*3/uL (ref 0–0.7)
Eosinophils Relative: 2 %
HEMATOCRIT: 37.6 % — AB (ref 40.0–52.0)
HEMOGLOBIN: 12.3 g/dL — AB (ref 13.0–18.0)
Lymphocytes Relative: 29 %
Lymphs Abs: 1.8 10*3/uL (ref 1.0–3.6)
MCH: 23.2 pg — ABNORMAL LOW (ref 26.0–34.0)
MCHC: 32.8 g/dL (ref 32.0–36.0)
MCV: 70.8 fL — ABNORMAL LOW (ref 80.0–100.0)
Monocytes Absolute: 0.7 10*3/uL (ref 0.2–1.0)
Monocytes Relative: 11 %
NEUTROS ABS: 3.5 10*3/uL (ref 1.4–6.5)
NEUTROS PCT: 57 %
Platelets: 398 10*3/uL (ref 150–440)
RBC: 5.32 MIL/uL (ref 4.40–5.90)
RDW: 17.5 % — ABNORMAL HIGH (ref 11.5–14.5)
WBC: 6.2 10*3/uL (ref 3.8–10.6)

## 2016-10-28 ENCOUNTER — Ambulatory Visit: Payer: BLUE CROSS/BLUE SHIELD | Admitting: Family Medicine

## 2016-10-28 DIAGNOSIS — D808 Other immunodeficiencies with predominantly antibody defects: Secondary | ICD-10-CM | POA: Diagnosis not present

## 2016-10-28 DIAGNOSIS — E785 Hyperlipidemia, unspecified: Secondary | ICD-10-CM | POA: Diagnosis not present

## 2016-10-28 DIAGNOSIS — I119 Hypertensive heart disease without heart failure: Secondary | ICD-10-CM | POA: Diagnosis not present

## 2016-10-28 DIAGNOSIS — D509 Iron deficiency anemia, unspecified: Secondary | ICD-10-CM | POA: Diagnosis not present

## 2016-10-28 DIAGNOSIS — Z79899 Other long term (current) drug therapy: Secondary | ICD-10-CM | POA: Diagnosis not present

## 2016-10-28 DIAGNOSIS — I4891 Unspecified atrial fibrillation: Secondary | ICD-10-CM | POA: Diagnosis not present

## 2016-10-28 DIAGNOSIS — K219 Gastro-esophageal reflux disease without esophagitis: Secondary | ICD-10-CM | POA: Diagnosis not present

## 2016-10-28 DIAGNOSIS — J45909 Unspecified asthma, uncomplicated: Secondary | ICD-10-CM | POA: Diagnosis not present

## 2016-10-28 DIAGNOSIS — Z7982 Long term (current) use of aspirin: Secondary | ICD-10-CM | POA: Diagnosis not present

## 2016-10-28 DIAGNOSIS — Z87891 Personal history of nicotine dependence: Secondary | ICD-10-CM | POA: Diagnosis not present

## 2016-10-28 LAB — KAPPA/LAMBDA LIGHT CHAINS
KAPPA FREE LGHT CHN: 50.7 mg/L — AB (ref 3.3–19.4)
Kappa, lambda light chain ratio: 2.23 — ABNORMAL HIGH (ref 0.26–1.65)
Lambda free light chains: 22.7 mg/L (ref 5.7–26.3)

## 2016-10-29 ENCOUNTER — Ambulatory Visit: Payer: BLUE CROSS/BLUE SHIELD | Admitting: Oncology

## 2016-10-29 ENCOUNTER — Other Ambulatory Visit: Payer: Self-pay | Admitting: *Deleted

## 2016-10-29 ENCOUNTER — Inpatient Hospital Stay (HOSPITAL_BASED_OUTPATIENT_CLINIC_OR_DEPARTMENT_OTHER): Payer: BLUE CROSS/BLUE SHIELD | Admitting: Oncology

## 2016-10-29 VITALS — BP 144/84 | HR 66 | Temp 97.5°F | Resp 18 | Wt 241.5 lb

## 2016-10-29 DIAGNOSIS — J45909 Unspecified asthma, uncomplicated: Secondary | ICD-10-CM | POA: Diagnosis not present

## 2016-10-29 DIAGNOSIS — Z87891 Personal history of nicotine dependence: Secondary | ICD-10-CM | POA: Diagnosis not present

## 2016-10-29 DIAGNOSIS — D509 Iron deficiency anemia, unspecified: Secondary | ICD-10-CM | POA: Diagnosis not present

## 2016-10-29 DIAGNOSIS — Z79899 Other long term (current) drug therapy: Secondary | ICD-10-CM | POA: Diagnosis not present

## 2016-10-29 DIAGNOSIS — I4891 Unspecified atrial fibrillation: Secondary | ICD-10-CM | POA: Diagnosis not present

## 2016-10-29 DIAGNOSIS — E785 Hyperlipidemia, unspecified: Secondary | ICD-10-CM | POA: Diagnosis not present

## 2016-10-29 DIAGNOSIS — E8809 Other disorders of plasma-protein metabolism, not elsewhere classified: Secondary | ICD-10-CM

## 2016-10-29 DIAGNOSIS — R779 Abnormality of plasma protein, unspecified: Secondary | ICD-10-CM

## 2016-10-29 DIAGNOSIS — K219 Gastro-esophageal reflux disease without esophagitis: Secondary | ICD-10-CM | POA: Diagnosis not present

## 2016-10-29 DIAGNOSIS — I119 Hypertensive heart disease without heart failure: Secondary | ICD-10-CM | POA: Diagnosis not present

## 2016-10-29 DIAGNOSIS — D808 Other immunodeficiencies with predominantly antibody defects: Secondary | ICD-10-CM | POA: Diagnosis not present

## 2016-10-29 DIAGNOSIS — Z7982 Long term (current) use of aspirin: Secondary | ICD-10-CM | POA: Diagnosis not present

## 2016-10-29 DIAGNOSIS — R718 Other abnormality of red blood cells: Secondary | ICD-10-CM

## 2016-10-29 NOTE — Progress Notes (Signed)
Patient is here for follow up, he is doing well no complaints  

## 2016-10-30 LAB — MULTIPLE MYELOMA PANEL, SERUM
ALBUMIN SERPL ELPH-MCNC: 3.4 g/dL (ref 2.9–4.4)
ALPHA 1: 0.3 g/dL (ref 0.0–0.4)
ALPHA2 GLOB SERPL ELPH-MCNC: 0.7 g/dL (ref 0.4–1.0)
Albumin/Glob SerPl: 0.8 (ref 0.7–1.7)
B-GLOBULIN SERPL ELPH-MCNC: 1.2 g/dL (ref 0.7–1.3)
GAMMA GLOB SERPL ELPH-MCNC: 2 g/dL — AB (ref 0.4–1.8)
GLOBULIN, TOTAL: 4.3 g/dL — AB (ref 2.2–3.9)
IGG (IMMUNOGLOBIN G), SERUM: 1875 mg/dL — AB (ref 700–1600)
IgA: 287 mg/dL (ref 90–386)
IgM, Serum: 130 mg/dL (ref 20–172)
TOTAL PROTEIN ELP: 7.7 g/dL (ref 6.0–8.5)

## 2016-10-30 LAB — UPEP/TP, 24-HR URINE
ALPHA 1 UR: 2.6 %
Albumin, U: 79.3 %
Alpha 2, Urine: 2.6 %
Beta, Urine: 9.8 %
Gamma Globulin, Urine: 5.6 %
TOTAL PROTEIN, URINE-UPE24: 14.2 mg/dL
TOTAL PROTEIN, URINE-UR/DAY: 241 mg/(24.h) — AB (ref 30–150)
TOTAL VOLUME: 1700
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
## 2016-11-10 ENCOUNTER — Ambulatory Visit: Payer: BLUE CROSS/BLUE SHIELD | Admitting: Oncology

## 2016-11-17 ENCOUNTER — Emergency Department: Payer: BLUE CROSS/BLUE SHIELD

## 2016-11-17 ENCOUNTER — Encounter: Payer: Self-pay | Admitting: Emergency Medicine

## 2016-11-17 ENCOUNTER — Other Ambulatory Visit: Payer: Self-pay

## 2016-11-17 ENCOUNTER — Emergency Department
Admission: EM | Admit: 2016-11-17 | Discharge: 2016-11-17 | Disposition: A | Discharge status: Home / Self Care | Payer: BLUE CROSS/BLUE SHIELD | Attending: Emergency Medicine | Admitting: Emergency Medicine

## 2016-11-17 DIAGNOSIS — Z87891 Personal history of nicotine dependence: Secondary | ICD-10-CM | POA: Insufficient documentation

## 2016-11-17 DIAGNOSIS — I10 Essential (primary) hypertension: Secondary | ICD-10-CM | POA: Diagnosis not present

## 2016-11-17 DIAGNOSIS — Z79899 Other long term (current) drug therapy: Secondary | ICD-10-CM | POA: Insufficient documentation

## 2016-11-17 DIAGNOSIS — J45909 Unspecified asthma, uncomplicated: Secondary | ICD-10-CM | POA: Insufficient documentation

## 2016-11-17 DIAGNOSIS — R0789 Other chest pain: Secondary | ICD-10-CM | POA: Insufficient documentation

## 2016-11-17 DIAGNOSIS — R079 Chest pain, unspecified: Secondary | ICD-10-CM | POA: Diagnosis not present

## 2016-11-17 LAB — CBC
HCT: 40.5 % (ref 40.0–52.0)
Hemoglobin: 12.7 g/dL — ABNORMAL LOW (ref 13.0–18.0)
MCH: 22.2 pg — ABNORMAL LOW (ref 26.0–34.0)
MCHC: 31.5 g/dL — ABNORMAL LOW (ref 32.0–36.0)
MCV: 70.7 fL — ABNORMAL LOW (ref 80.0–100.0)
PLATELETS: 389 10*3/uL (ref 150–440)
RBC: 5.73 MIL/uL (ref 4.40–5.90)
RDW: 17.3 % — ABNORMAL HIGH (ref 11.5–14.5)
WBC: 5.8 10*3/uL (ref 3.8–10.6)

## 2016-11-17 LAB — BASIC METABOLIC PANEL
Anion gap: 7 (ref 5–15)
BUN: 14 mg/dL (ref 6–20)
CHLORIDE: 105 mmol/L (ref 101–111)
CO2: 25 mmol/L (ref 22–32)
CREATININE: 1.13 mg/dL (ref 0.61–1.24)
Calcium: 9.2 mg/dL (ref 8.9–10.3)
Glucose, Bld: 93 mg/dL (ref 65–99)
Potassium: 3.8 mmol/L (ref 3.5–5.1)
SODIUM: 137 mmol/L (ref 135–145)

## 2016-11-17 LAB — TROPONIN I

## 2016-11-17 MED ORDER — CYCLOBENZAPRINE HCL 10 MG PO TABS
5.0000 mg | ORAL_TABLET | Freq: Once | ORAL | Status: AC
  Administered 2016-11-17: 5 mg via ORAL
  Filled 2016-11-17: qty 1

## 2016-11-17 MED ORDER — AMLODIPINE BESYLATE 2.5 MG PO TABS: 2.5000 mg | ORAL_TABLET | Freq: Every day | ORAL | 3 refills | Status: DC

## 2016-11-17 MED ORDER — CYCLOBENZAPRINE HCL 5 MG PO TABS: 5.0000 mg | ORAL_TABLET | Freq: Three times a day (TID) | ORAL | 0 refills | Status: DC | PRN

## 2016-11-17 MED ORDER — IBUPROFEN 800 MG PO TABS
800.0000 mg | ORAL_TABLET | Freq: Once | ORAL | Status: AC
  Administered 2016-11-17: 800 mg via ORAL
  Filled 2016-11-17: qty 1
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
## 2016-11-17 NOTE — ED Notes (Signed)
Katie RN aware of placement in Room 3.

## 2016-11-17 NOTE — ED Triage Notes (Signed)
Pt c/o intermittent left chest pain with nausea last 2 days. Denies SHOB. Unlabored respirations. Skin warm and dry.  NAD. VSS

## 2016-11-17 NOTE — ED Notes (Signed)
Patient returns from X ray.

## 2016-11-17 NOTE — Telephone Encounter (Signed)
Also, blood pressure has been running high and back in July you gave rx for amlodipine 2.5mg  but patient never took due to blood pressure staying good.  Needs now due to it being high again.  Spoke with [name] and RX has been re entered in chart and phoned in.

## 2016-11-17 NOTE — Telephone Encounter (Signed)
Patient stopped by today to inform you he went to the ER for chest pain and was given flexeril 5 mg 1 tab tid prn, wanted to notify you due to pain contract.

## 2016-11-17 NOTE — Discharge Instructions (Signed)
Take motrin for pain.   Take flexeril for muscle spasms. Talk to your pain medicine doctor regarding this.   You are prescribed hydrocodone by your pain specialist, let your doctor know if you are taking it.   Avoid heavy lifting for 2 days.   See your doctor  Take your blood pressure medicine as prescribed   Return to ER if you have severe chest pain, trouble breathing, shortness of breath.

## 2016-11-17 NOTE — ED Notes (Signed)
To Xray via wheelchair.

## 2016-11-23 ENCOUNTER — Ambulatory Visit: Payer: BLUE CROSS/BLUE SHIELD | Admitting: Family Medicine

## 2016-11-25 ENCOUNTER — Ambulatory Visit: Payer: BLUE CROSS/BLUE SHIELD | Admitting: Oncology

## 2016-12-10 ENCOUNTER — Ambulatory Visit (INDEPENDENT_AMBULATORY_CARE_PROVIDER_SITE_OTHER): Payer: BLUE CROSS/BLUE SHIELD | Admitting: Family Medicine

## 2016-12-10 ENCOUNTER — Encounter: Payer: Self-pay | Admitting: Family Medicine

## 2016-12-10 VITALS — BP 124/82 | HR 76 | Temp 98.1°F | Resp 16 | Ht 71.0 in | Wt 240.1 lb

## 2016-12-10 DIAGNOSIS — G8929 Other chronic pain: Secondary | ICD-10-CM

## 2016-12-10 DIAGNOSIS — Z79891 Long term (current) use of opiate analgesic: Secondary | ICD-10-CM | POA: Diagnosis not present

## 2016-12-10 DIAGNOSIS — R52 Pain, unspecified: Secondary | ICD-10-CM

## 2016-12-10 DIAGNOSIS — G4700 Insomnia, unspecified: Secondary | ICD-10-CM

## 2016-12-10 DIAGNOSIS — T07XXXA Unspecified multiple injuries, initial encounter: Secondary | ICD-10-CM | POA: Diagnosis not present

## 2016-12-10 DIAGNOSIS — I10 Essential (primary) hypertension: Secondary | ICD-10-CM

## 2016-12-10 DIAGNOSIS — Z79899 Other long term (current) drug therapy: Secondary | ICD-10-CM

## 2016-12-10 MED ORDER — AMLODIPINE BESYLATE 5 MG PO TABS: 2.5000 mg | ORAL_TABLET | Freq: Every day | ORAL | 3 refills | Status: DC

## 2016-12-10 MED ORDER — HYDROCODONE-ACETAMINOPHEN 10-325 MG PO TABS: 0.5000 | ORAL_TABLET | Freq: Four times a day (QID) | ORAL | 0 refills | Status: DC | PRN

## 2016-12-10 NOTE — Patient Instructions (Addendum)
Take 5mg  daily of Amlodipine unless:  - Blood pressure reads less than 120/70 - then take 1/2 tablet (2.5mg ) - If blood pressure is less than 105/60 - DO NOT TAKE amlodipine that day.  - Please keep a log of your blood pressure daily and bring with you to your next appointment.  - Try melatonin 1-3mg  dose at the exact same time each night about 19min-60min before trying to fall asleep for about 2 weeks, use good sleep routine before going to bed. Also listen to meditation podcasts or youtube videos etc.  - You may take Tylenol (Acetaminophen) 650mg  (2 regular strength) as needed if you do NOT take your Norco.  Please do not take more than 2000mg  of Tylenol per day.  Your Norco has 325mg  Tylenol per tablet.
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
## 2016-12-24 ENCOUNTER — Encounter: Payer: Self-pay | Admitting: Family Medicine

## 2016-12-24 ENCOUNTER — Ambulatory Visit (INDEPENDENT_AMBULATORY_CARE_PROVIDER_SITE_OTHER): Payer: BLUE CROSS/BLUE SHIELD | Admitting: Family Medicine

## 2016-12-24 ENCOUNTER — Emergency Department
Admission: EM | Admit: 2016-12-24 | Discharge: 2016-12-24 | Disposition: A | Discharge status: Home / Self Care | Payer: BLUE CROSS/BLUE SHIELD | Attending: Emergency Medicine | Admitting: Emergency Medicine

## 2016-12-24 ENCOUNTER — Emergency Department: Payer: BLUE CROSS/BLUE SHIELD

## 2016-12-24 VITALS — BP 138/84 | HR 97 | Temp 98.4°F | Resp 18 | Ht 71.0 in | Wt 239.5 lb

## 2016-12-24 DIAGNOSIS — Z87891 Personal history of nicotine dependence: Secondary | ICD-10-CM | POA: Diagnosis not present

## 2016-12-24 DIAGNOSIS — Z79899 Other long term (current) drug therapy: Secondary | ICD-10-CM | POA: Diagnosis not present

## 2016-12-24 DIAGNOSIS — Z7982 Long term (current) use of aspirin: Secondary | ICD-10-CM | POA: Insufficient documentation

## 2016-12-24 DIAGNOSIS — I480 Paroxysmal atrial fibrillation: Secondary | ICD-10-CM

## 2016-12-24 DIAGNOSIS — I10 Essential (primary) hypertension: Secondary | ICD-10-CM

## 2016-12-24 DIAGNOSIS — R0602 Shortness of breath: Secondary | ICD-10-CM | POA: Diagnosis not present

## 2016-12-24 DIAGNOSIS — R079 Chest pain, unspecified: Secondary | ICD-10-CM | POA: Diagnosis not present

## 2016-12-24 DIAGNOSIS — R0789 Other chest pain: Secondary | ICD-10-CM | POA: Insufficient documentation

## 2016-12-24 DIAGNOSIS — J45909 Unspecified asthma, uncomplicated: Secondary | ICD-10-CM | POA: Insufficient documentation

## 2016-12-24 DIAGNOSIS — R5383 Other fatigue: Secondary | ICD-10-CM

## 2016-12-24 DIAGNOSIS — R7303 Prediabetes: Secondary | ICD-10-CM | POA: Insufficient documentation

## 2016-12-24 LAB — TROPONIN I: Troponin I: <0.03 ng/mL (ref <0.03)

## 2016-12-24 LAB — CBC
HEMATOCRIT: 40.7 % (ref 40.0–52.0)
Hemoglobin: 13 g/dL (ref 13.0–18.0)
MCH: 22.6 pg — ABNORMAL LOW (ref 26.0–34.0)
MCHC: 32.1 g/dL (ref 32.0–36.0)
MCV: 70.4 fL — ABNORMAL LOW (ref 80.0–100.0)
PLATELETS: 399 10*3/uL (ref 150–440)
RBC: 5.77 MIL/uL (ref 4.40–5.90)
RDW: 17.3 % — AB (ref 11.5–14.5)
WBC: 7.8 10*3/uL (ref 3.8–10.6)

## 2016-12-24 LAB — BASIC METABOLIC PANEL
Anion gap: 8 (ref 5–15)
BUN: 15 mg/dL (ref 6–20)
CALCIUM: 9.3 mg/dL (ref 8.9–10.3)
CO2: 24 mmol/L (ref 22–32)
CREATININE: 0.96 mg/dL (ref 0.61–1.24)
Chloride: 103 mmol/L (ref 101–111)
GFR calc Af Amer: >60 mL/min (ref >60)
GLUCOSE: 102 mg/dL — AB (ref 65–99)
POTASSIUM: 4 mmol/L (ref 3.5–5.1)
Sodium: 135 mmol/L (ref 135–145)

## 2016-12-24 MED ORDER — AMLODIPINE BESYLATE 5 MG PO TABS: 2.5000 mg | ORAL_TABLET | Freq: Every day | ORAL | 3 refills | Status: DC
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
## 2016-12-24 NOTE — Patient Instructions (Signed)
Please go directly to [facility] ER for further evaluation.  
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
## 2016-12-24 NOTE — Discharge Instructions (Signed)
As we discussed, though you did have a spike in your blood pressure (hypertension), fortunately it is not immediately dangerous at this time and does not need emergency intervention or admission to the hospital.  Your blood pressure was quite good in the Emergency Department today.  If we add to or change your regular medications, we may cause more harm than good - it is more appropriate for your primary care doctor to evaluate you in clinic and decide if any medication changes are needed.  Please follow up in clinic as recommended in these papers.    Return to the Emergency Department (ED) if you experience any worsening chest pain/pressure/tightness, difficulty breathing, or sudden sweating, or other symptoms that concern you.  Continue taking your daily baby aspirin as well.

## 2016-12-24 NOTE — ED Triage Notes (Signed)
Pt c/o chest tightness/pressure that started around 11am today, states he went to his PCP and had an ECG and was referred to the ED for lab work and further eval, pt states his pain has improved and is just feeling "fatigue".Dale Kennedy

## 2017-01-08 ENCOUNTER — Ambulatory Visit: Payer: BLUE CROSS/BLUE SHIELD | Admitting: Family Medicine

## 2017-01-14 ENCOUNTER — Ambulatory Visit: Payer: BLUE CROSS/BLUE SHIELD | Admitting: Family Medicine

## 2017-02-09 ENCOUNTER — Ambulatory Visit: Payer: Self-pay | Admitting: *Deleted

## 2017-02-09 NOTE — Telephone Encounter (Signed)
Pt  Requesting    A  Refill   Of  Hydrocodone         Pt  Advised   He  Would  Need  To  Make  An  Appointment  With  His  pcp   He  Agreed

## 2017-02-11 ENCOUNTER — Telehealth: Payer: Self-pay | Admitting: Family Medicine

## 2017-02-11 NOTE — Telephone Encounter (Signed)
CALLED PATIENT AND HE IS COMING TOMORROW 02-12-17 TO SEE LADA

## 2017-02-11 NOTE — Telephone Encounter (Signed)
Copied from [place] #[id number]. Topic: Quick Communication - See Telephone Encounter >> Feb 11, 2017  2:14 PM Bea Graff, NT wrote: CRM for notification. See Telephone encounter for: Patient states that he has been trying to make an appt all week to see [name] all week but that no one has called him back. I made him an appt for 02/16/17 at 8am but he states now he will be out of his pain medicine on Sunday and will be living in pain. He would like a call back asap to see if something can be called in. I did state that in previous message that [name] stated he needs an appt first. Pt still would like a call back.   02/11/17.

## 2017-02-12 ENCOUNTER — Ambulatory Visit (INDEPENDENT_AMBULATORY_CARE_PROVIDER_SITE_OTHER): Payer: BLUE CROSS/BLUE SHIELD | Admitting: Family Medicine

## 2017-02-12 ENCOUNTER — Encounter: Payer: Self-pay | Admitting: Family Medicine

## 2017-02-12 DIAGNOSIS — R52 Pain, unspecified: Secondary | ICD-10-CM

## 2017-02-12 DIAGNOSIS — Z79899 Other long term (current) drug therapy: Secondary | ICD-10-CM | POA: Diagnosis not present

## 2017-02-12 DIAGNOSIS — E782 Mixed hyperlipidemia: Secondary | ICD-10-CM

## 2017-02-12 DIAGNOSIS — T07XXXA Unspecified multiple injuries, initial encounter: Secondary | ICD-10-CM

## 2017-02-12 DIAGNOSIS — G8929 Other chronic pain: Secondary | ICD-10-CM | POA: Diagnosis not present

## 2017-02-12 DIAGNOSIS — I10 Essential (primary) hypertension: Secondary | ICD-10-CM | POA: Diagnosis not present

## 2017-02-12 DIAGNOSIS — Z79891 Long term (current) use of opiate analgesic: Secondary | ICD-10-CM

## 2017-02-12 DIAGNOSIS — R7303 Prediabetes: Secondary | ICD-10-CM

## 2017-02-12 MED ORDER — EZETIMIBE 10 MG PO TABS: 10.0000 mg | ORAL_TABLET | Freq: Every day | ORAL | 11 refills | Status: DC

## 2017-02-12 MED ORDER — FLUTICASONE PROPIONATE 50 MCG/ACT NA SUSP: 2.0000 | Freq: Every day | NASAL | 11 refills | Status: DC

## 2017-02-12 MED ORDER — HYDROCODONE-ACETAMINOPHEN 10-325 MG PO TABS: 0.5000 | ORAL_TABLET | Freq: Four times a day (QID) | ORAL | 0 refills | Status: DC | PRN

## 2017-02-12 NOTE — Assessment & Plan Note (Signed)
Patient will monitor his BP at home; use the CCB if high; try DASH guidelines
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
## 2017-02-12 NOTE — Assessment & Plan Note (Signed)
Continue medicine, UDS to confirm presence of drug and r/o illicits

## 2017-02-12 NOTE — Assessment & Plan Note (Addendum)
Get back on the cholesterol medicine; okay to take in the morning; recheck fasting labs in about 3 months

## 2017-02-12 NOTE — Assessment & Plan Note (Signed)
Monitoring; will try to use less hydrocodone but will keep that available for bad days; primary pain relief will be tylenol, 2000 mg daily okay with a little extra in pain medicine

## 2017-02-12 NOTE — Patient Instructions (Addendum)
Try to use PLAIN allergy medicine without the decongestant Avoid: phenylephrine, phenylpropanolamine, and pseudoephredine  Stop the Afrin Use the new nasal spray which has a steroid and will help with nasal congestion  Try plain tylenol (acetaminophen) up to 2,000 mg per day of the over-the-counter Use the hydrocodone if needed, but try to use less  Try to lose 10 pounds before your next visit Check out the information at familydoctor.org entitled "Nutrition for Weight Loss: What You Need to Know about Fad Diets" Try to lose between 1-2 pounds per week by taking in fewer calories and burning off more calories You can succeed by limiting portions, limiting foods dense in calories and fat, becoming more active, and drinking 8 glasses of water a day (64 ounces) Don't skip meals, especially breakfast, as skipping meals may alter your metabolism Do not use over-the-counter weight loss pills or gimmicks that claim rapid weight loss A healthy BMI (or body mass index) is between 18.5 and 24.9 You can calculate your ideal BMI at the [place] website [employer]

## 2017-02-12 NOTE — Assessment & Plan Note (Addendum)
Recheck glucose and A1c in 3 months when he comes back

## 2017-02-12 NOTE — Assessment & Plan Note (Signed)
New agreement updated; reviewed [place] web site; no early fills, no red flags; UDS ordered today

## 2017-02-14 LAB — DRUG SCREEN URINE W/ALC, NO CONF
ALCOHOL, ETHYL (U): NEGATIVE
AMPHETAMINES (1000 ng/mL SCRN): NEGATIVE
BARBITURATES: NEGATIVE
BENZODIAZEPINES: NEGATIVE
COCAINE METABOLITES: NEGATIVE
MARIJUANA MET (50 NG/ML SCRN): NEGATIVE
METHADONE: NEGATIVE
METHAQUALONE: NEGATIVE
OPIATES: POSITIVE — AB
PHENCYCLIDINE: NEGATIVE
PROPOXYPHENE: NEGATIVE

## 2017-02-16 ENCOUNTER — Ambulatory Visit: Payer: Self-pay | Admitting: Family Medicine

## 2017-02-22 DIAGNOSIS — F4310 Post-traumatic stress disorder, unspecified: Secondary | ICD-10-CM | POA: Diagnosis not present

## 2017-03-03 ENCOUNTER — Other Ambulatory Visit: Payer: Self-pay

## 2017-03-03 ENCOUNTER — Emergency Department
Admission: EM | Admit: 2017-03-03 | Discharge: 2017-03-03 | Disposition: A | Discharge status: Home / Self Care | Payer: BLUE CROSS/BLUE SHIELD | Attending: Emergency Medicine | Admitting: Emergency Medicine

## 2017-03-03 ENCOUNTER — Encounter: Payer: Self-pay | Admitting: *Deleted

## 2017-03-03 ENCOUNTER — Ambulatory Visit: Payer: Self-pay | Admitting: *Deleted

## 2017-03-03 ENCOUNTER — Emergency Department: Payer: BLUE CROSS/BLUE SHIELD

## 2017-03-03 DIAGNOSIS — Z7982 Long term (current) use of aspirin: Secondary | ICD-10-CM | POA: Insufficient documentation

## 2017-03-03 DIAGNOSIS — J45909 Unspecified asthma, uncomplicated: Secondary | ICD-10-CM | POA: Diagnosis not present

## 2017-03-03 DIAGNOSIS — I10 Essential (primary) hypertension: Secondary | ICD-10-CM | POA: Insufficient documentation

## 2017-03-03 DIAGNOSIS — G8929 Other chronic pain: Secondary | ICD-10-CM | POA: Insufficient documentation

## 2017-03-03 DIAGNOSIS — Z79899 Other long term (current) drug therapy: Secondary | ICD-10-CM | POA: Diagnosis not present

## 2017-03-03 DIAGNOSIS — R0789 Other chest pain: Secondary | ICD-10-CM | POA: Insufficient documentation

## 2017-03-03 DIAGNOSIS — R079 Chest pain, unspecified: Secondary | ICD-10-CM | POA: Diagnosis not present

## 2017-03-03 DIAGNOSIS — Z87891 Personal history of nicotine dependence: Secondary | ICD-10-CM | POA: Diagnosis not present

## 2017-03-03 LAB — BASIC METABOLIC PANEL
ANION GAP: 8 (ref 5–15)
BUN: 15 mg/dL (ref 6–20)
CHLORIDE: 100 mmol/L — AB (ref 101–111)
CO2: 27 mmol/L (ref 22–32)
Calcium: 9.3 mg/dL (ref 8.9–10.3)
Creatinine, Ser: 1.01 mg/dL (ref 0.61–1.24)
Glucose, Bld: 102 mg/dL — ABNORMAL HIGH (ref 65–99)
POTASSIUM: 3.9 mmol/L (ref 3.5–5.1)
SODIUM: 135 mmol/L (ref 135–145)

## 2017-03-03 LAB — TROPONIN I: Troponin I: <0.03 ng/mL (ref <0.03)

## 2017-03-03 LAB — CBC
HEMATOCRIT: 41.6 % (ref 40.0–52.0)
HEMOGLOBIN: 13.1 g/dL (ref 13.0–18.0)
MCH: 22.2 pg — ABNORMAL LOW (ref 26.0–34.0)
MCHC: 31.6 g/dL — ABNORMAL LOW (ref 32.0–36.0)
MCV: 70.2 fL — ABNORMAL LOW (ref 80.0–100.0)
Platelets: 372 10*3/uL (ref 150–440)
RBC: 5.93 MIL/uL — ABNORMAL HIGH (ref 4.40–5.90)
RDW: 17.3 % — AB (ref 11.5–14.5)
WBC: 7.2 10*3/uL (ref 3.8–10.6)

## 2017-03-03 MED ORDER — IBUPROFEN 400 MG PO TABS: 600.0000 mg | ORAL_TABLET | Freq: Once | ORAL | Status: DC

## 2017-03-03 MED ORDER — IBUPROFEN 800 MG PO TABS
800.0000 mg | ORAL_TABLET | Freq: Once | ORAL | Status: AC
  Administered 2017-03-03: 800 mg via ORAL

## 2017-03-03 MED ORDER — IBUPROFEN 800 MG PO TABS
ORAL_TABLET | ORAL | Status: AC
  Filled 2017-03-03: qty 1

## 2017-03-03 NOTE — ED Triage Notes (Signed)
Pt has chest pain in center of chest since 0900 today.  Pt reports tightness in chest with some sob.  Nonsmoker.  No cough.  No fever .  Pt alert.  Pt took pepto without relief.
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
## 2017-03-03 NOTE — Telephone Encounter (Signed)
  Reason for Disposition . SEVERE chest pain  Additional Information . Commented on: Answer Assessment - Initial Assessment Questions    Patient states he is having severe indigestion that started at work this morning. He has taken medication that has not helped his symptoms. He states he feels his heart is skipping a beat . His wife is bring him to the office or the ED- advised patient he should go to ED to be evaluated- they are in transit now and she is driving him.  Protocols used: CHEST PAIN-A-AH

## 2017-03-17 ENCOUNTER — Other Ambulatory Visit: Payer: Self-pay | Admitting: Family Medicine

## 2017-03-17 DIAGNOSIS — G8929 Other chronic pain: Secondary | ICD-10-CM

## 2017-03-17 DIAGNOSIS — R52 Pain, unspecified: Principal | ICD-10-CM

## 2017-03-17 DIAGNOSIS — Z79891 Long term (current) use of opiate analgesic: Secondary | ICD-10-CM

## 2017-03-17 DIAGNOSIS — T07XXXA Unspecified multiple injuries, initial encounter: Secondary | ICD-10-CM

## 2017-03-17 MED ORDER — HYDROCODONE-ACETAMINOPHEN 10-325 MG PO TABS: 0.5000 | ORAL_TABLET | Freq: Four times a day (QID) | ORAL | 0 refills | Status: DC | PRN

## 2017-03-17 NOTE — Telephone Encounter (Signed)
I sent him a response through MyChart about his urine drug screen Please encourage him to check his MyChart account I reviewed UDS and [place] web site No red flags Okay for two more months of medicine and we'll see him in February for his next visit Thank you

## 2017-03-17 NOTE — Telephone Encounter (Signed)
Pt notified ready for pickup.

## 2017-03-17 NOTE — Telephone Encounter (Signed)
Patient checking status.

## 2017-03-17 NOTE — Telephone Encounter (Signed)
Copied from [place] [phone]. Topic: Quick Communication - See Telephone Encounter >> Mar 17, 2017  9:22 AM Aurelio Brash B wrote: CRM for notification. See Telephone encounter for:  Refill hydrocodone-acetaminophen , pt stated he took drug test to get this refilled and has not  heard anything since 03/17/17.

## 2017-03-23 ENCOUNTER — Ambulatory Visit: Payer: BLUE CROSS/BLUE SHIELD | Admitting: Family Medicine

## 2017-04-29 ENCOUNTER — Ambulatory Visit (INDEPENDENT_AMBULATORY_CARE_PROVIDER_SITE_OTHER): Payer: BLUE CROSS/BLUE SHIELD | Admitting: Family Medicine

## 2017-04-29 ENCOUNTER — Ambulatory Visit: Payer: Self-pay | Admitting: *Deleted

## 2017-04-29 ENCOUNTER — Encounter: Payer: Self-pay | Admitting: Family Medicine

## 2017-04-29 VITALS — BP 142/78 | HR 74 | Temp 97.9°F | Resp 18 | Ht 71.0 in | Wt 238.8 lb

## 2017-04-29 DIAGNOSIS — J4520 Mild intermittent asthma, uncomplicated: Secondary | ICD-10-CM | POA: Diagnosis not present

## 2017-04-29 DIAGNOSIS — R0789 Other chest pain: Secondary | ICD-10-CM

## 2017-04-29 DIAGNOSIS — I10 Essential (primary) hypertension: Secondary | ICD-10-CM

## 2017-04-29 DIAGNOSIS — R002 Palpitations: Secondary | ICD-10-CM

## 2017-04-29 DIAGNOSIS — I480 Paroxysmal atrial fibrillation: Secondary | ICD-10-CM | POA: Diagnosis not present

## 2017-04-29 DIAGNOSIS — J45909 Unspecified asthma, uncomplicated: Secondary | ICD-10-CM | POA: Insufficient documentation

## 2017-04-29 DIAGNOSIS — R718 Other abnormality of red blood cells: Secondary | ICD-10-CM

## 2017-04-29 MED ORDER — METOPROLOL SUCCINATE ER 25 MG PO TB24: 50.0000 mg | ORAL_TABLET | Freq: Every day | ORAL | 0 refills | Status: DC

## 2017-04-29 NOTE — Patient Instructions (Addendum)
DASH Eating Plan DASH stands for "Dietary Approaches to Stop Hypertension." The DASH eating plan is a healthy eating plan that has been shown to reduce high blood pressure (hypertension). It may also reduce your risk for type 2 diabetes, heart disease, and stroke. The DASH eating plan may also help with weight loss. What are tips for following this plan? General guidelines  Avoid eating more than 2,300 mg (milligrams) of salt (sodium) a day. If you have hypertension, you may need to reduce your sodium intake to 1,500 mg a day.  Limit alcohol intake to no more than 1 drink a day for nonpregnant women and 2 drinks a day for men. One drink equals 12 oz of beer, 5 oz of wine, or 1 oz of hard liquor.  Work with your health care provider to maintain a healthy body weight or to lose weight. Ask what an ideal weight is for you.  Get at least 30 minutes of exercise that causes your heart to beat faster (aerobic exercise) most days of the week. Activities may include walking, swimming, or biking.  Work with your health care provider or diet and nutrition specialist (dietitian) to adjust your eating plan to your individual calorie needs. Reading food labels  Check food labels for the amount of sodium per serving. Choose foods with less than 5 percent of the Daily Value of sodium. Generally, foods with less than 300 mg of sodium per serving fit into this eating plan.  To find whole grains, look for the word "whole" as the first word in the ingredient list. Shopping  Buy products labeled as "low-sodium" or "no salt added."  Buy fresh foods. Avoid canned foods and premade or frozen meals. Cooking  Avoid adding salt when cooking. Use salt-free seasonings or herbs instead of table salt or sea salt. Check with your health care provider or pharmacist before using salt substitutes.  Do not fry foods. Cook foods using healthy methods such as baking, boiling, grilling, and broiling instead.  Cook with  heart-healthy oils, such as olive, canola, soybean, or sunflower oil. Meal planning   Eat a balanced diet that includes: ? 5 or more servings of fruits and vegetables each day. At each meal, try to fill half of your plate with fruits and vegetables. ? Up to 6-8 servings of whole grains each day. ? Less than 6 oz of lean meat, poultry, or fish each day. A 3-oz serving of meat is about the same size as a deck of cards. One egg equals 1 oz. ? 2 servings of low-fat dairy each day. ? A serving of nuts, seeds, or beans 5 times each week. ? Heart-healthy fats. Healthy fats called Omega-3 fatty acids are found in foods such as flaxseeds and coldwater fish, like sardines, salmon, and mackerel.  Limit how much you eat of the following: ? Canned or prepackaged foods. ? Food that is high in trans fat, such as fried foods. ? Food that is high in saturated fat, such as fatty meat. ? Sweets, desserts, sugary drinks, and other foods with added sugar. ? Full-fat dairy products.  Do not salt foods before eating.  Try to eat at least 2 vegetarian meals each week.  Eat more home-cooked food and less restaurant, buffet, and fast food.  When eating at a restaurant, ask that your food be prepared with less salt or no salt, if possible. What foods are recommended? The items listed may not be a complete list. Talk with your dietitian about what   dietary choices are best for you. Grains Whole-grain or whole-wheat bread. Whole-grain or whole-wheat pasta. Brown rice. Oatmeal. Quinoa. Bulgur. Whole-grain and low-sodium cereals. Pita bread. Low-fat, low-sodium crackers. Whole-wheat flour tortillas. Vegetables Fresh or frozen vegetables (raw, steamed, roasted, or grilled). Low-sodium or reduced-sodium tomato and vegetable juice. Low-sodium or reduced-sodium tomato sauce and tomato paste. Low-sodium or reduced-sodium canned vegetables. Fruits All fresh, dried, or frozen fruit. Canned fruit in natural juice (without  added sugar). Meat and other protein foods Skinless chicken or turkey. Ground chicken or turkey. Pork with fat trimmed off. Fish and seafood. Egg whites. Dried beans, peas, or lentils. Unsalted nuts, nut butters, and seeds. Unsalted canned beans. Lean cuts of beef with fat trimmed off. Low-sodium, lean deli meat. Dairy Low-fat (1%) or fat-free (skim) milk. Fat-free, low-fat, or reduced-fat cheeses. Nonfat, low-sodium ricotta or cottage cheese. Low-fat or nonfat yogurt. Low-fat, low-sodium cheese. Fats and oils Soft margarine without trans fats. Vegetable oil. Low-fat, reduced-fat, or light mayonnaise and salad dressings (reduced-sodium). Canola, safflower, olive, soybean, and sunflower oils. Avocado. Seasoning and other foods Herbs. Spices. Seasoning mixes without salt. Unsalted popcorn and pretzels. Fat-free sweets. What foods are not recommended? The items listed may not be a complete list. Talk with your dietitian about what dietary choices are best for you. Grains Baked goods made with fat, such as croissants, muffins, or some breads. Dry pasta or rice meal packs. Vegetables Creamed or fried vegetables. Vegetables in a cheese sauce. Regular canned vegetables (not low-sodium or reduced-sodium). Regular canned tomato sauce and paste (not low-sodium or reduced-sodium). Regular tomato and vegetable juice (not low-sodium or reduced-sodium). Pickles. Olives. Fruits Canned fruit in a light or heavy syrup. Fried fruit. Fruit in cream or butter sauce. Meat and other protein foods Fatty cuts of meat. Ribs. Fried meat. Bacon. Sausage. Bologna and other processed lunch meats. Salami. Fatback. Hotdogs. Bratwurst. Salted nuts and seeds. Canned beans with added salt. Canned or smoked fish. Whole eggs or egg yolks. Chicken or turkey with skin. Dairy Whole or 2% milk, cream, and half-and-half. Whole or full-fat cream cheese. Whole-fat or sweetened yogurt. Full-fat cheese. Nondairy creamers. Whipped toppings.  Processed cheese and cheese spreads. Fats and oils Butter. Stick margarine. Lard. Shortening. Ghee. Bacon fat. Tropical oils, such as coconut, palm kernel, or palm oil. Seasoning and other foods Salted popcorn and pretzels. Onion salt, garlic salt, seasoned salt, table salt, and sea salt. Worcestershire sauce. Tartar sauce. Barbecue sauce. Teriyaki sauce. Soy sauce, including reduced-sodium. Steak sauce. Canned and packaged gravies. Fish sauce. Oyster sauce. Cocktail sauce. Horseradish that you find on the shelf. Ketchup. Mustard. Meat flavorings and tenderizers. Bouillon cubes. Hot sauce and Tabasco sauce. Premade or packaged marinades. Premade or packaged taco seasonings. Relishes. Regular salad dressings. Where to find more information:  National Heart, Lung, and Blood Institute: www.nhlbi.nih.gov  American Heart Association: www.heart.org Summary  The DASH eating plan is a healthy eating plan that has been shown to reduce high blood pressure (hypertension). It may also reduce your risk for type 2 diabetes, heart disease, and stroke.  With the DASH eating plan, you should limit salt (sodium) intake to 2,300 mg a day. If you have hypertension, you may need to reduce your sodium intake to 1,500 mg a day.  When on the DASH eating plan, aim to eat more fresh fruits and vegetables, whole grains, lean proteins, low-fat dairy, and heart-healthy fats.  Work with your health care provider or diet and nutrition specialist (dietitian) to adjust your eating plan to your individual   calorie needs. This information is not intended to replace advice given to you by your health care provider. Make sure you discuss any questions you have with your health care provider. Document Released: 03/12/2011 Document Revised: 03/16/2016 Document Reviewed: 03/16/2016 Elsevier Interactive Patient Education  2018 [employer].  Chest Wall Pain Chest wall pain is pain in or around the bones and muscles of your chest.  Sometimes, an injury causes this pain. Sometimes, the cause may not be known. This pain may take several weeks or longer to get better. Follow these instructions at home: Pay attention to any changes in your symptoms. Take these actions to help with your pain:  Rest as told by your doctor.  Avoid activities that cause pain. Try not to use your chest, belly (abdominal), or side muscles to lift heavy things.  If directed, apply ice to the painful area: ? Put ice in a plastic bag. ? Place a towel between your skin and the bag. ? Leave the ice on for 20 minutes, 2-3 times per day.  Take over-the-counter and prescription medicines only as told by your doctor.  Do not use tobacco products, including cigarettes, chewing tobacco, and e-cigarettes. If you need help quitting, ask your doctor.  Keep all follow-up visits as told by your doctor. This is important.  Contact a doctor if:  You have a fever.  Your chest pain gets worse.  You have new symptoms. Get help right away if:  You feel sick to your stomach (nauseous) or you throw up (vomit).  You feel sweaty or light-headed.  You have a cough with phlegm (sputum) or you cough up blood.  You are short of breath. This information is not intended to replace advice given to you by your health care provider. Make sure you discuss any questions you have with your health care provider. Document Released: 09/09/2007 Document Revised: 08/29/2015 Document Reviewed: 06/18/2014 Elsevier Interactive Patient Education  2018 [employer].  Palpitations A palpitation is the feeling that your heart:  Has an uneven (irregular) heartbeat.  Is beating faster than normal.  Is fluttering.  Is skipping a beat.  This is usually not a serious problem. In some cases, you may need more medical tests. Follow these instructions at home:  Avoid: ? Caffeine in coffee, tea, soft drinks, diet pills, and energy drinks. ? Chocolate. ? Alcohol.  Do not  use any tobacco products. These include cigarettes, chewing tobacco, and e-cigarettes. If you need help quitting, ask your doctor.  Try to reduce your stress. These things may help: ? Yoga. ? Meditation. ? Physical activity. Swimming, jogging, and walking are good choices. ? A method that helps you use your mind to control things in your body, like heartbeats (biofeedback).  Get plenty of rest and sleep.  Take over-the-counter and prescription medicines only as told by your doctor.  Keep all follow-up visits as told by your doctor. This is important. Contact a doctor if:  Your heartbeat is still fast or uneven after 24 hours.  Your palpitations occur more often. Get help right away if:  You have chest pain.  You feel short of breath.  You have a very bad headache.  You feel dizzy.  You pass out (faint). This information is not intended to replace advice given to you by your health care provider. Make sure you discuss any questions you have with your health care provider. Document Released: 12/31/2007 Document Revised: 08/29/2015 Document Reviewed: 12/06/2014 Elsevier Interactive Patient Education  [employer].

## 2017-04-29 NOTE — Telephone Encounter (Signed)
Pt   Reports  Had  Episode this  Am   Where  He  Had   Some   palpations  And  Anxious  Feelings he  Has  A  History  Of  A  Fib  And  htn    His  bp  Is  Elevated  As   Well  He  Denies  Any  Chest  Pain or difficulty   Breathing  At this  Time  But reports  Some  Atypical  Discomfort  At  Times  Over  Last  Few  Days  He  Reports  The  Pain    Is  Worse   On movement  And when he  Takes  A  Deep breath   He   States  He  Did  Some  Recent  Heavy physical  Activity  Recently   Working on a  [employer] . appt made  Today  With  Raelyn Ensign  At 140 pm   Pt  Advised   If  Any  Chest pain  Shortness  Of  Breath or  Any other  Symptoms  Pt  Advised  To  Call  911     Reason for Disposition . [1] Skipped or extra beat(s) AND [2] occurs 4 or more times per minute  Answer Assessment - Initial Assessment Questions 1. BLOOD PRESSURE: "What is the blood pressure?" "Did you take at least two measurements 5 minutes apart?"     153/104     Checked     Again  160/101    2. ONSET: "When did you take your blood pressure?"       15  mins  ago 3. HOW: "How did you obtain the blood pressure?" (e.g., visiting nurse, automatic home BP monitor)     automativ  Machine  At  Pharmacy   4. HISTORY: "Do you have a history of high blood pressure?"      Yes   5. MEDICATIONS: "Are you taking any medications for blood pressure?" "Have you missed any doses recently?"       Was on meds   Stopped   By  Provider  Because  It  Came  Down   6. OTHER SYMPTOMS: "Do you have any symptoms?" (e.g., headache, chest pain, blurred vision, difficulty breathing, weakness)palpations  That lasted  approx  15  mins    History  Of  A  Fib     intermittant  For    4-5  Days    Worse   When  He  Takes  A  Breath        7. PREGNANCY: "Is there any chance you are pregnant?" "When was your last menstrual period?"    n/a  Answer Assessment - Initial Assessment Questions 1. DESCRIPTION: "Please describe your heart rate or heart beat that you are  having" (e.g., fast/slow, regular/irregular, skipped or extra beats, "palpitations")     Had  Episode  Of  Heart   Skipping  And    Pounding  1.5 hours   Ago    2. ONSET: "When did it start?" (Minutes, hours or days)        This  Am    3. DURATION: "How long does it last" (e.g., seconds, minutes, hours)         Lasted  About  15 mins     4. PATTERN "Does it come and go, or has it  been constant since it started?"  "Does it get worse with exertion?"   "Are you feeling it now?"      Came  And   Went     5. TAP: "Using your hand, can you tap out what you are feeling on a chair or table in front of you, so that I can hear?" (Note: not all patients can do this)       FEELS NORMAL   6. HEART RATE: "Can you tell me your heart rate?" "How many beats in 15 seconds?"  (Note: not all patients can do this)       82 7. RECURRENT SYMPTOM: "Have you ever had this before?" If so, ask: "When was the last time?" and "What happened that time?"      Had   Episode  Of   A  Fib   sev  Months   8. CAUSE: "What do you think is causing the palpitations?"       POSSIBLE     A  FIB      9. CARDIAC HISTORY: "Do you have any history of heart disease?" (e.g., heart attack, angina, bypass surgery, angioplasty, arrhythmia)      A  FIB   10. OTHER SYMPTOMS: "Do you have any other symptoms?" (e.g., dizziness, chest pain, sweating, difficulty breathing)      FEELS  ANXIOUS    11. PREGNANCY: "Is there any chance you are pregnant?" "When was your last menstrual period?"N/A  Protocols used: HEART RATE AND HEARTBEAT QUESTIONS-A-AH, HIGH BLOOD PRESSURE-A-AH
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
## 2017-04-29 NOTE — Telephone Encounter (Signed)
Patient coming for appointment today at 1:40pm

## 2017-04-30 ENCOUNTER — Inpatient Hospital Stay: Payer: BLUE CROSS/BLUE SHIELD | Attending: Oncology

## 2017-04-30 DIAGNOSIS — Z79899 Other long term (current) drug therapy: Secondary | ICD-10-CM | POA: Insufficient documentation

## 2017-04-30 DIAGNOSIS — D509 Iron deficiency anemia, unspecified: Secondary | ICD-10-CM | POA: Insufficient documentation

## 2017-04-30 DIAGNOSIS — I10 Essential (primary) hypertension: Secondary | ICD-10-CM | POA: Diagnosis not present

## 2017-04-30 DIAGNOSIS — I4891 Unspecified atrial fibrillation: Secondary | ICD-10-CM | POA: Insufficient documentation

## 2017-04-30 DIAGNOSIS — Z87891 Personal history of nicotine dependence: Secondary | ICD-10-CM | POA: Diagnosis not present

## 2017-04-30 DIAGNOSIS — E785 Hyperlipidemia, unspecified: Secondary | ICD-10-CM | POA: Insufficient documentation

## 2017-04-30 DIAGNOSIS — J45909 Unspecified asthma, uncomplicated: Secondary | ICD-10-CM | POA: Diagnosis not present

## 2017-04-30 DIAGNOSIS — R779 Abnormality of plasma protein, unspecified: Secondary | ICD-10-CM

## 2017-04-30 DIAGNOSIS — Z7982 Long term (current) use of aspirin: Secondary | ICD-10-CM | POA: Diagnosis not present

## 2017-04-30 DIAGNOSIS — K219 Gastro-esophageal reflux disease without esophagitis: Secondary | ICD-10-CM | POA: Diagnosis not present

## 2017-04-30 DIAGNOSIS — D808 Other immunodeficiencies with predominantly antibody defects: Secondary | ICD-10-CM | POA: Insufficient documentation

## 2017-04-30 DIAGNOSIS — E8809 Other disorders of plasma-protein metabolism, not elsewhere classified: Secondary | ICD-10-CM

## 2017-04-30 LAB — CBC WITH DIFFERENTIAL/PLATELET
Basophils Absolute: 0 10*3/uL (ref 0–0.1)
Basophils Relative: 1 %
Eosinophils Absolute: 0.1 10*3/uL (ref 0–0.7)
Eosinophils Relative: 2 %
HEMATOCRIT: 43.3 % (ref 40.0–52.0)
HEMOGLOBIN: 13.5 g/dL (ref 13.0–18.0)
LYMPHS ABS: 1.9 10*3/uL (ref 1.0–3.6)
LYMPHS PCT: 29 %
MCH: 22.5 pg — AB (ref 26.0–34.0)
MCHC: 31.3 g/dL — AB (ref 32.0–36.0)
MCV: 72 fL — ABNORMAL LOW (ref 80.0–100.0)
Monocytes Absolute: 0.8 10*3/uL (ref 0.2–1.0)
Monocytes Relative: 12 %
NEUTROS ABS: 3.8 10*3/uL (ref 1.4–6.5)
NEUTROS PCT: 58 %
Platelets: 365 10*3/uL (ref 150–440)
RBC: 6.01 MIL/uL — AB (ref 4.40–5.90)
RDW: 17.9 % — ABNORMAL HIGH (ref 11.5–14.5)
WBC: 6.6 10*3/uL (ref 3.8–10.6)

## 2017-04-30 LAB — BASIC METABOLIC PANEL
ANION GAP: 8 (ref 5–15)
BUN: 15 mg/dL (ref 6–20)
CHLORIDE: 104 mmol/L (ref 101–111)
CO2: 25 mmol/L (ref 22–32)
Calcium: 9.2 mg/dL (ref 8.9–10.3)
Creatinine, Ser: 0.94 mg/dL (ref 0.61–1.24)
GFR calc non Af Amer: >60 mL/min (ref >60)
Glucose, Bld: 88 mg/dL (ref 65–99)
POTASSIUM: 4.2 mmol/L (ref 3.5–5.1)
Sodium: 137 mmol/L (ref 135–145)

## 2017-05-01 LAB — IGG, IGA, IGM
IGG (IMMUNOGLOBIN G), SERUM: 2275 mg/dL — AB (ref 700–1600)
IgA: 344 mg/dL (ref 90–386)
IgM (Immunoglobulin M), Srm: 146 mg/dL (ref 20–172)
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
## 2017-05-03 ENCOUNTER — Inpatient Hospital Stay (HOSPITAL_BASED_OUTPATIENT_CLINIC_OR_DEPARTMENT_OTHER): Payer: BLUE CROSS/BLUE SHIELD | Admitting: Oncology

## 2017-05-03 VITALS — BP 144/80 | HR 74 | Temp 96.8°F | Resp 18 | Wt 237.0 lb

## 2017-05-03 DIAGNOSIS — R011 Cardiac murmur, unspecified: Secondary | ICD-10-CM | POA: Diagnosis not present

## 2017-05-03 DIAGNOSIS — R778 Other specified abnormalities of plasma proteins: Secondary | ICD-10-CM

## 2017-05-03 DIAGNOSIS — Z87891 Personal history of nicotine dependence: Secondary | ICD-10-CM | POA: Diagnosis not present

## 2017-05-03 DIAGNOSIS — Z7982 Long term (current) use of aspirin: Secondary | ICD-10-CM | POA: Diagnosis not present

## 2017-05-03 DIAGNOSIS — D808 Other immunodeficiencies with predominantly antibody defects: Secondary | ICD-10-CM | POA: Diagnosis not present

## 2017-05-03 DIAGNOSIS — K219 Gastro-esophageal reflux disease without esophagitis: Secondary | ICD-10-CM | POA: Diagnosis not present

## 2017-05-03 DIAGNOSIS — I4891 Unspecified atrial fibrillation: Secondary | ICD-10-CM | POA: Diagnosis not present

## 2017-05-03 DIAGNOSIS — Z79899 Other long term (current) drug therapy: Secondary | ICD-10-CM | POA: Diagnosis not present

## 2017-05-03 DIAGNOSIS — D509 Iron deficiency anemia, unspecified: Secondary | ICD-10-CM

## 2017-05-03 DIAGNOSIS — E785 Hyperlipidemia, unspecified: Secondary | ICD-10-CM | POA: Diagnosis not present

## 2017-05-03 DIAGNOSIS — I10 Essential (primary) hypertension: Secondary | ICD-10-CM | POA: Diagnosis not present

## 2017-05-03 DIAGNOSIS — J45909 Unspecified asthma, uncomplicated: Secondary | ICD-10-CM | POA: Diagnosis not present

## 2017-05-03 DIAGNOSIS — M19079 Primary osteoarthritis, unspecified ankle and foot: Secondary | ICD-10-CM | POA: Diagnosis not present

## 2017-05-03 DIAGNOSIS — R718 Other abnormality of red blood cells: Secondary | ICD-10-CM

## 2017-05-03 LAB — PROTEIN ELECTROPHORESIS, SERUM
A/G Ratio: 0.8 (ref 0.7–1.7)
ALBUMIN ELP: 3.7 g/dL (ref 2.9–4.4)
ALPHA-1-GLOBULIN: 0.3 g/dL (ref 0.0–0.4)
Alpha-2-Globulin: 0.7 g/dL (ref 0.4–1.0)
Beta Globulin: 1.3 g/dL (ref 0.7–1.3)
GAMMA GLOBULIN: 2.2 g/dL — AB (ref 0.4–1.8)
Globulin, Total: 4.4 g/dL — ABNORMAL HIGH (ref 2.2–3.9)
Total Protein ELP: 8.1 g/dL (ref 6.0–8.5)

## 2017-05-03 LAB — KAPPA/LAMBDA LIGHT CHAINS
Kappa free light chain: 55.3 mg/L — ABNORMAL HIGH (ref 3.3–19.4)
Kappa, lambda light chain ratio: 2.53 — ABNORMAL HIGH (ref 0.26–1.65)
LAMDA FREE LIGHT CHAINS: 21.9 mg/L (ref 5.7–26.3)

## 2017-05-17 ENCOUNTER — Ambulatory Visit: Payer: BLUE CROSS/BLUE SHIELD | Admitting: Family Medicine

## 2017-05-19 ENCOUNTER — Other Ambulatory Visit: Payer: Self-pay

## 2017-05-19 DIAGNOSIS — Z79891 Long term (current) use of opiate analgesic: Secondary | ICD-10-CM

## 2017-05-19 DIAGNOSIS — G8929 Other chronic pain: Secondary | ICD-10-CM

## 2017-05-19 DIAGNOSIS — T07XXXA Unspecified multiple injuries, initial encounter: Secondary | ICD-10-CM

## 2017-05-19 DIAGNOSIS — R52 Pain, unspecified: Principal | ICD-10-CM

## 2017-05-19 NOTE — Telephone Encounter (Signed)
Due to pt being rescheduled he will be out of pain meds by Saturday, please refill and he was scheduled for you next avaible

## 2017-05-20 MED ORDER — HYDROCODONE-ACETAMINOPHEN 10-325 MG PO TABS: 0.5000 | ORAL_TABLET | Freq: Four times a day (QID) | ORAL | 0 refills | Status: DC | PRN

## 2017-05-20 NOTE — Telephone Encounter (Signed)
Printed, York Cerise will hand to patient

## 2017-05-20 NOTE — Telephone Encounter (Signed)
I cannot refill this from home I will ask my colleague, Dr. Ancil Boozer, if she will be willing to prescribe just 3 pills for Saturday, Sunday, Monday and I'll have a new prescription for him on Monday If not, patient will need to use NSAID for pain relief until Monday

## 2017-05-21 ENCOUNTER — Ambulatory Visit: Payer: BLUE CROSS/BLUE SHIELD | Admitting: Family Medicine

## 2017-05-23 NOTE — Progress Notes (Signed)
Closing out scanned document note open since  2018

## 2017-05-24 ENCOUNTER — Other Ambulatory Visit: Payer: Self-pay

## 2017-05-24 ENCOUNTER — Other Ambulatory Visit: Payer: Self-pay | Admitting: Family Medicine

## 2017-05-24 DIAGNOSIS — T07XXXA Unspecified multiple injuries, initial encounter: Secondary | ICD-10-CM

## 2017-05-24 DIAGNOSIS — R52 Pain, unspecified: Principal | ICD-10-CM

## 2017-05-24 DIAGNOSIS — G8929 Other chronic pain: Secondary | ICD-10-CM

## 2017-05-24 DIAGNOSIS — Z79891 Long term (current) use of opiate analgesic: Secondary | ICD-10-CM

## 2017-05-24 MED ORDER — HYDROCODONE-ACETAMINOPHEN 10-325 MG PO TABS: 0.5000 | ORAL_TABLET | Freq: Four times a day (QID) | ORAL | 0 refills | Status: DC | PRN

## 2017-05-24 NOTE — Telephone Encounter (Signed)
I just refilled this earlier this morning; ready for pick-up

## 2017-05-24 NOTE — Progress Notes (Signed)
Rx ready for patient [employer] site reviewed

## 2017-05-28 ENCOUNTER — Ambulatory Visit (INDEPENDENT_AMBULATORY_CARE_PROVIDER_SITE_OTHER): Payer: BLUE CROSS/BLUE SHIELD | Admitting: Family Medicine

## 2017-05-28 ENCOUNTER — Encounter: Payer: Self-pay | Admitting: Family Medicine

## 2017-05-28 VITALS — BP 132/70 | HR 64 | Temp 97.5°F | Ht 71.0 in | Wt 243.2 lb

## 2017-05-28 DIAGNOSIS — R002 Palpitations: Secondary | ICD-10-CM

## 2017-05-28 DIAGNOSIS — Z79899 Other long term (current) drug therapy: Secondary | ICD-10-CM | POA: Diagnosis not present

## 2017-05-28 DIAGNOSIS — M62838 Other muscle spasm: Secondary | ICD-10-CM

## 2017-05-28 DIAGNOSIS — I10 Essential (primary) hypertension: Secondary | ICD-10-CM | POA: Diagnosis not present

## 2017-05-28 DIAGNOSIS — I480 Paroxysmal atrial fibrillation: Secondary | ICD-10-CM

## 2017-05-28 DIAGNOSIS — G8929 Other chronic pain: Secondary | ICD-10-CM | POA: Diagnosis not present

## 2017-05-28 DIAGNOSIS — R52 Pain, unspecified: Secondary | ICD-10-CM

## 2017-05-28 MED ORDER — METOPROLOL SUCCINATE ER 25 MG PO TB24: 50.0000 mg | ORAL_TABLET | Freq: Every day | ORAL | 5 refills | Status: DC

## 2017-05-28 MED ORDER — METOPROLOL SUCCINATE ER 25 MG PO TB24: 50.0000 mg | ORAL_TABLET | Freq: Every day | ORAL | 0 refills | Status: DC
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
## 2017-05-28 NOTE — Patient Instructions (Addendum)
Stop the HCTZ Increase the metoprolol to 50 mg daily Okay to use plain tylenol per package directions instead of the hydrocodone for mild to moderate pain When you are due for your next pain refill, we will drop down the strength and help you taper down Take 250 mg of magnesium oxide daily to see if that helps the spasms

## 2017-06-07 NOTE — Assessment & Plan Note (Signed)
Continue the medication, but patient does agree to taper; will continue what he has for now and then decrease dose at next refill (that will be between visits, and he may call for refill and I'll lower the strength)

## 2017-06-07 NOTE — Assessment & Plan Note (Signed)
Compliant with visits, no red flags

## 2017-06-07 NOTE — Assessment & Plan Note (Signed)
Stop the HCTZ and increase beta-blocker

## 2017-06-21 ENCOUNTER — Other Ambulatory Visit: Payer: Self-pay | Admitting: Family Medicine

## 2017-06-21 DIAGNOSIS — T07XXXA Unspecified multiple injuries, initial encounter: Secondary | ICD-10-CM

## 2017-06-21 DIAGNOSIS — R52 Pain, unspecified: Principal | ICD-10-CM

## 2017-06-21 DIAGNOSIS — Z79891 Long term (current) use of opiate analgesic: Secondary | ICD-10-CM

## 2017-06-21 DIAGNOSIS — G8929 Other chronic pain: Secondary | ICD-10-CM

## 2017-06-21 MED ORDER — HYDROCODONE-ACETAMINOPHEN 7.5-325 MG PO TABS: 0.5000 | ORAL_TABLET | Freq: Two times a day (BID) | ORAL | 0 refills | Status: DC | PRN

## 2017-06-21 NOTE — Telephone Encounter (Signed)
Please advise. Thanks.  

## 2017-06-21 NOTE — Telephone Encounter (Signed)
Copied from [place]. Topic: Quick Communication - Rx Refill/Question >> Jun 21, 2017  3:42 PM Percell Belt A wrote: Medication: HYDROcodone-acetaminophen ([place]) 10-325 MG tablet [[id number]   Has the patient contacted their pharmacy?   (Agent: If no, request that the patient contact the pharmacy for the refill.)   Preferred Pharmacy (with phone number or street name): pt called in for refill told him I would send to office.  He stated that he was just going to stop the office so he could get pick up    Agent: Please be advised that RX refills may take up to 3 business days. We ask that you follow-up with your pharmacy.

## 2017-06-21 NOTE — Progress Notes (Signed)
Lowering the dose

## 2017-06-21 NOTE — Telephone Encounter (Signed)
Lowering the dose; Rx given to patient hre in the office this afternoon

## 2017-07-21 ENCOUNTER — Ambulatory Visit (INDEPENDENT_AMBULATORY_CARE_PROVIDER_SITE_OTHER): Payer: BLUE CROSS/BLUE SHIELD | Admitting: Family Medicine

## 2017-07-21 ENCOUNTER — Encounter: Payer: Self-pay | Admitting: Family Medicine

## 2017-07-21 DIAGNOSIS — T07XXXA Unspecified multiple injuries, initial encounter: Secondary | ICD-10-CM | POA: Diagnosis not present

## 2017-07-21 DIAGNOSIS — J301 Allergic rhinitis due to pollen: Secondary | ICD-10-CM

## 2017-07-21 DIAGNOSIS — M25552 Pain in left hip: Secondary | ICD-10-CM | POA: Diagnosis not present

## 2017-07-21 DIAGNOSIS — I10 Essential (primary) hypertension: Secondary | ICD-10-CM

## 2017-07-21 DIAGNOSIS — E669 Obesity, unspecified: Secondary | ICD-10-CM

## 2017-07-21 DIAGNOSIS — I480 Paroxysmal atrial fibrillation: Secondary | ICD-10-CM | POA: Diagnosis not present

## 2017-07-21 DIAGNOSIS — G8929 Other chronic pain: Secondary | ICD-10-CM | POA: Diagnosis not present

## 2017-07-21 MED ORDER — LORATADINE 10 MG PO TABS: 10.0000 mg | ORAL_TABLET | Freq: Every day | ORAL | 11 refills | Status: DC | PRN

## 2017-07-21 MED ORDER — FLUTICASONE PROPIONATE 50 MCG/ACT NA SUSP: 2.0000 | Freq: Every day | NASAL | 11 refills | Status: DC | PRN

## 2017-07-21 MED ORDER — HYDROCODONE-ACETAMINOPHEN 5-325 MG PO TABS: 0.5000 | ORAL_TABLET | Freq: Four times a day (QID) | ORAL | 0 refills | Status: DC | PRN
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
## 2017-07-21 NOTE — Patient Instructions (Addendum)
Try good ergonomics, good positioning Try to follow the DASH guidelines (DASH stands for Dietary Approaches to Stop Hypertension). Try to limit the sodium in your diet to no more than 1,500mg  of sodium per day. Certainly try to not exceed 2,000 mg per day at the very most. Do not add salt when cooking or at the table.  Check the sodium amount on labels when shopping, and choose items lower in sodium when given a choice. Avoid or limit foods that already contain a lot of sodium. Eat a diet rich in fruits and vegetables and whole grains, and try to lose weight if overweight or obese Check out the information at familydoctor.org entitled "Nutrition for Weight Loss: What You Need to Know about Fad Diets" Try to lose between 1-2 pounds per week by taking in fewer calories and burning off more calories You can succeed by limiting portions, limiting foods dense in calories and fat, becoming more active, and drinking 8 glasses of water a day (64 ounces) Don't skip meals, especially breakfast, as skipping meals may alter your metabolism Do not use over-the-counter weight loss pills or gimmicks that claim rapid weight loss A healthy BMI (or body mass index) is between 18.5 and 24.9 You can calculate your ideal BMI at the [place] website ClubMonetize.fr Try to lose 10 pounds over the next 3 months Cut back on fast food  Obesity, Adult Obesity is the condition of having too much total body fat. Being overweight or obese means that your weight is greater than what is considered healthy for your body size. Obesity is determined by a measurement called BMI. BMI is an estimate of body fat and is calculated from height and weight. For adults, a BMI of 30 or higher is considered obese. Obesity can eventually lead to other health concerns and major illnesses, including:  Stroke.  Coronary artery disease (CAD).  Type 2 diabetes.  Some types of cancer, including  cancers of the colon, breast, uterus, and gallbladder.  Osteoarthritis.  High blood pressure (hypertension).  High cholesterol.  Sleep apnea.  Gallbladder stones.  Infertility problems.  What are the causes? The main cause of obesity is taking in (consuming) more calories than your body uses for energy. Other factors that contribute to this condition may include:  Being born with genes that make you more likely to become obese.  Having a medical condition that causes obesity. These conditions include: ? Hypothyroidism. ? Polycystic ovarian syndrome (PCOS). ? Binge-eating disorder. ? Cushing syndrome.  Taking certain medicines, such as steroids, antidepressants, and seizure medicines.  Not being physically active (sedentary lifestyle).  Living where there are limited places to exercise safely or buy healthy foods.  Not getting enough sleep.  What increases the risk? The following factors may increase your risk of this condition:  Having a family history of obesity.  Being a woman of African-American descent.  Being a man of Hispanic descent.  What are the signs or symptoms? Having excessive body fat is the main symptom of this condition. How is this diagnosed? This condition may be diagnosed based on:  Your symptoms.  Your medical history.  A physical exam. Your health care provider may measure: ? Your BMI. If you are an adult with a BMI between 25 and less than 30, you are considered overweight. If you are an adult with a BMI of 30 or higher, you are considered obese. ? The distances around your hips and your waist (circumferences). These may be compared to each other  to help diagnose your condition. ? Your skinfold thickness. Your health care provider may gently pinch a fold of your skin and measure it.  How is this treated? Treatment for this condition often includes changing your lifestyle. Treatment may include some or all of the following:  Dietary  changes. Work with your health care provider and a dietitian to set a weight-loss goal that is healthy and reasonable for you. Dietary changes may include eating: ? Smaller portions. A portion size is the amount of a particular food that is healthy for you to eat at one time. This varies from person to person. ? Low-calorie or low-fat options. ? More whole grains, fruits, and vegetables.  Regular physical activity. This may include aerobic activity (cardio) and strength training.  Medicine to help you lose weight. Your health care provider may prescribe medicine if you are unable to lose 1 pound a week after 6 weeks of eating more healthily and doing more physical activity.  Surgery. Surgical options may include gastric banding and gastric bypass. Surgery may be done if: ? Other treatments have not helped to improve your condition. ? You have a BMI of 40 or higher. ? You have life-threatening health problems related to obesity.  Follow these instructions at home:  Eating and drinking   Follow recommendations from your health care provider about what you eat and drink. Your health care provider may advise you to: ? Limit fast foods, sweets, and processed snack foods. ? Choose low-fat options, such as low-fat milk instead of whole milk. ? Eat 5 or more servings of fruits or vegetables every day. ? Eat at home more often. This gives you more control over what you eat. ? Choose healthy foods when you eat out. ? Learn what a healthy portion size is. ? Keep low-fat snacks on hand. ? Avoid sugary drinks, such as soda, fruit juice, iced tea sweetened with sugar, and flavored milk. ? Eat a healthy breakfast.  Drink enough water to keep your urine clear or pale yellow.  Do not go without eating for long periods of time (do not fast) or follow a fad diet. Fasting and fad diets can be unhealthy and even dangerous. Physical Activity  Exercise regularly, as told by your health care provider. Ask  your health care provider what types of exercise are safe for you and how often you should exercise.  Warm up and stretch before being active.  Cool down and stretch after being active.  Rest between periods of activity. Lifestyle  Limit the time that you spend in front of your TV, computer, or video game system.  Find ways to reward yourself that do not involve food.  Limit alcohol intake to no more than 1 drink a day for nonpregnant women and 2 drinks a day for men. One drink equals 12 oz of beer, 5 oz of wine, or 1 oz of hard liquor. General instructions  Keep a weight loss journal to keep track of the food you eat and how much you exercise you get.  Take over-the-counter and prescription medicines only as told by your health care provider.  Take vitamins and supplements only as told by your health care provider.  Consider joining a support group. Your health care provider may be able to recommend a support group.  Keep all follow-up visits as told by your health care provider. This is important. Contact a health care provider if:  You are unable to meet your weight loss goal after 6  weeks of dietary and lifestyle changes. This information is not intended to replace advice given to you by your health care provider. Make sure you discuss any questions you have with your health care provider. Document Released: 04/30/2004 Document Revised: 08/26/2015 Document Reviewed: 01/09/2015 Elsevier Interactive Patient Education  2018 [place] Eating Plan DASH stands for "Dietary Approaches to Stop Hypertension." The DASH eating plan is a healthy eating plan that has been shown to reduce high blood pressure (hypertension). It may also reduce your risk for type 2 diabetes, heart disease, and stroke. The DASH eating plan may also help with weight loss. What are tips for following this plan? General guidelines  Avoid eating more than 2,300 mg (milligrams) of salt (sodium) a day.  If you have hypertension, you may need to reduce your sodium intake to 1,500 mg a day.  Limit alcohol intake to no more than 1 drink a day for nonpregnant women and 2 drinks a day for men. One drink equals 12 oz of beer, 5 oz of wine, or 1 oz of hard liquor.  Work with your health care provider to maintain a healthy body weight or to lose weight. Ask what an ideal weight is for you.  Get at least 30 minutes of exercise that causes your heart to beat faster (aerobic exercise) most days of the week. Activities may include walking, swimming, or biking.  Work with your health care provider or diet and nutrition specialist (dietitian) to adjust your eating plan to your individual calorie needs. Reading food labels  Check food labels for the amount of sodium per serving. Choose foods with less than 5 percent of the Daily Value of sodium. Generally, foods with less than 300 mg of sodium per serving fit into this eating plan.  To find whole grains, look for the word "whole" as the first word in the ingredient list. Shopping  Buy products labeled as "low-sodium" or "no salt added."  Buy fresh foods. Avoid canned foods and premade or frozen meals. Cooking  Avoid adding salt when cooking. Use salt-free seasonings or herbs instead of table salt or sea salt. Check with your health care provider or pharmacist before using salt substitutes.  Do not fry foods. Cook foods using healthy methods such as baking, boiling, grilling, and broiling instead.  Cook with heart-healthy oils, such as olive, canola, soybean, or sunflower oil. Meal planning   Eat a balanced diet that includes: ? 5 or more servings of fruits and vegetables each day. At each meal, try to fill half of your plate with fruits and vegetables. ? Up to 6-8 servings of whole grains each day. ? Less than 6 oz of lean meat, poultry, or fish each day. A 3-oz serving of meat is about the same size as a deck of cards. One egg equals 1 oz. ? 2  servings of low-fat dairy each day. ? A serving of nuts, seeds, or beans 5 times each week. ? Heart-healthy fats. Healthy fats called Omega-3 fatty acids are found in foods such as flaxseeds and coldwater fish, like sardines, salmon, and mackerel.  Limit how much you eat of the following: ? Canned or prepackaged foods. ? Food that is high in trans fat, such as fried foods. ? Food that is high in saturated fat, such as fatty meat. ? Sweets, desserts, sugary drinks, and other foods with added sugar. ? Full-fat dairy products.  Do not salt foods before eating.  Try to eat at least 2 vegetarian  meals each week.  Eat more home-cooked food and less restaurant, buffet, and fast food.  When eating at a restaurant, ask that your food be prepared with less salt or no salt, if possible. What foods are recommended? The items listed may not be a complete list. Talk with your dietitian about what dietary choices are best for you. Grains Whole-grain or whole-wheat bread. Whole-grain or whole-wheat pasta. Brown rice. [place]. Bulgur. Whole-grain and low-sodium cereals. Pita bread. Low-fat, low-sodium crackers. Whole-wheat flour tortillas. Vegetables Fresh or frozen vegetables (raw, steamed, roasted, or grilled). Low-sodium or reduced-sodium tomato and vegetable juice. Low-sodium or reduced-sodium tomato sauce and tomato paste. Low-sodium or reduced-sodium canned vegetables. Fruits All fresh, dried, or frozen fruit. Canned fruit in natural juice (without added sugar). Meat and other protein foods Skinless chicken or [place]. Ground chicken or [place]. Pork with fat trimmed off. Fish and seafood. Egg whites. Dried beans, peas, or lentils. Unsalted nuts, nut butters, and seeds. Unsalted canned beans. Lean cuts of beef with fat trimmed off. Low-sodium, lean deli meat. Dairy Low-fat (1%) or fat-free (skim) milk. Fat-free, low-fat, or reduced-fat cheeses. Nonfat, low-sodium ricotta or cottage cheese.  Low-fat or nonfat yogurt. Low-fat, low-sodium cheese. Fats and oils Soft margarine without trans fats. Vegetable oil. Low-fat, reduced-fat, or light mayonnaise and salad dressings (reduced-sodium). Canola, safflower, olive, soybean, and sunflower oils. Avocado. Seasoning and other foods Herbs. Spices. Seasoning mixes without salt. Unsalted popcorn and pretzels. Fat-free sweets. What foods are not recommended? The items listed may not be a complete list. Talk with your dietitian about what dietary choices are best for you. Grains Baked goods made with fat, such as croissants, muffins, or some breads. Dry pasta or rice meal packs. Vegetables Creamed or fried vegetables. Vegetables in a cheese sauce. Regular canned vegetables (not low-sodium or reduced-sodium). Regular canned tomato sauce and paste (not low-sodium or reduced-sodium). Regular tomato and vegetable juice (not low-sodium or reduced-sodium). Angie Fava. Olives. Fruits Canned fruit in a light or heavy syrup. Fried fruit. Fruit in cream or butter sauce. Meat and other protein foods Fatty cuts of meat. Ribs. Fried meat. Berniece Salines. Sausage. Bologna and other processed lunch meats. Salami. Fatback. Hotdogs. Bratwurst. Salted nuts and seeds. Canned beans with added salt. Canned or smoked fish. Whole eggs or egg yolks. Chicken or [place] with skin. Dairy Whole or 2% milk, cream, and half-and-half. Whole or full-fat cream cheese. Whole-fat or sweetened yogurt. Full-fat cheese. Nondairy creamers. Whipped toppings. Processed cheese and cheese spreads. Fats and oils Butter. Stick margarine. Lard. Shortening. Ghee. Bacon fat. Tropical oils, such as coconut, palm kernel, or palm oil. Seasoning and other foods Salted popcorn and pretzels. Onion salt, garlic salt, seasoned salt, table salt, and sea salt. Worcestershire sauce. Tartar sauce. Barbecue sauce. Teriyaki sauce. Soy sauce, including reduced-sodium. Steak sauce. Canned and packaged gravies. Fish sauce.  Oyster sauce. Cocktail sauce. Horseradish that you find on the shelf. Ketchup. Mustard. Meat flavorings and tenderizers. Bouillon cubes. Hot sauce and Tabasco sauce. Premade or packaged marinades. Premade or packaged taco seasonings. Relishes. Regular salad dressings. Where to find more information:  National Heart, Lung, and [place]: https://wilson-eaton.com/  American Heart Association: www.heart.org Summary  The DASH eating plan is a healthy eating plan that has been shown to reduce high blood pressure (hypertension). It may also reduce your risk for type 2 diabetes, heart disease, and stroke.  With the DASH eating plan, you should limit salt (sodium) intake to 2,300 mg a day. If you have hypertension, you may need to  reduce your sodium intake to 1,500 mg a day.  When on the DASH eating plan, aim to eat more fresh fruits and vegetables, whole grains, lean proteins, low-fat dairy, and heart-healthy fats.  Work with your health care provider or diet and nutrition specialist (dietitian) to adjust your eating plan to your individual calorie needs. This information is not intended to replace advice given to you by your health care provider. Make sure you discuss any questions you have with your health care provider. Document Released: 03/12/2011 Document Revised: 03/16/2016 Document Reviewed: 03/16/2016 Elsevier Interactive Patient Education  [employer].

## 2017-07-28 ENCOUNTER — Emergency Department
Admission: EM | Admit: 2017-07-28 | Discharge: 2017-07-28 | Disposition: A | Discharge status: Home / Self Care | Payer: BLUE CROSS/BLUE SHIELD | Attending: Emergency Medicine | Admitting: Emergency Medicine

## 2017-07-28 ENCOUNTER — Other Ambulatory Visit: Payer: Self-pay

## 2017-07-28 ENCOUNTER — Encounter: Payer: Self-pay | Admitting: Emergency Medicine

## 2017-07-28 ENCOUNTER — Emergency Department: Payer: BLUE CROSS/BLUE SHIELD

## 2017-07-28 DIAGNOSIS — Z87891 Personal history of nicotine dependence: Secondary | ICD-10-CM | POA: Diagnosis not present

## 2017-07-28 DIAGNOSIS — R079 Chest pain, unspecified: Secondary | ICD-10-CM | POA: Diagnosis not present

## 2017-07-28 DIAGNOSIS — Z79899 Other long term (current) drug therapy: Secondary | ICD-10-CM | POA: Insufficient documentation

## 2017-07-28 DIAGNOSIS — K219 Gastro-esophageal reflux disease without esophagitis: Secondary | ICD-10-CM | POA: Diagnosis not present

## 2017-07-28 DIAGNOSIS — I10 Essential (primary) hypertension: Secondary | ICD-10-CM | POA: Insufficient documentation

## 2017-07-28 DIAGNOSIS — J45909 Unspecified asthma, uncomplicated: Secondary | ICD-10-CM | POA: Insufficient documentation

## 2017-07-28 DIAGNOSIS — Z7982 Long term (current) use of aspirin: Secondary | ICD-10-CM | POA: Insufficient documentation

## 2017-07-28 DIAGNOSIS — R0789 Other chest pain: Secondary | ICD-10-CM | POA: Insufficient documentation

## 2017-07-28 LAB — BASIC METABOLIC PANEL
ANION GAP: 7 (ref 5–15)
BUN: 15 mg/dL (ref 6–20)
CALCIUM: 8.8 mg/dL — AB (ref 8.9–10.3)
CO2: 26 mmol/L (ref 22–32)
Chloride: 104 mmol/L (ref 101–111)
Creatinine, Ser: 1.08 mg/dL (ref 0.61–1.24)
GFR calc non Af Amer: >60 mL/min (ref >60)
Glucose, Bld: 108 mg/dL — ABNORMAL HIGH (ref 65–99)
Potassium: 3.9 mmol/L (ref 3.5–5.1)
Sodium: 137 mmol/L (ref 135–145)

## 2017-07-28 LAB — TROPONIN I: Troponin I: <0.03 ng/mL (ref <0.03)

## 2017-07-28 LAB — CBC
HCT: 43 % (ref 40.0–52.0)
HEMOGLOBIN: 13.7 g/dL (ref 13.0–18.0)
MCH: 22.8 pg — AB (ref 26.0–34.0)
MCHC: 31.8 g/dL — ABNORMAL LOW (ref 32.0–36.0)
MCV: 71.6 fL — ABNORMAL LOW (ref 80.0–100.0)
Platelets: 367 10*3/uL (ref 150–440)
RBC: 6 MIL/uL — AB (ref 4.40–5.90)
RDW: 17.2 % — ABNORMAL HIGH (ref 11.5–14.5)
WBC: 6.3 10*3/uL (ref 3.8–10.6)

## 2017-07-28 MED ORDER — FAMOTIDINE 20 MG PO TABS: 20.0000 mg | ORAL_TABLET | Freq: Two times a day (BID) | ORAL | 0 refills | Status: DC

## 2017-07-28 MED ORDER — METOCLOPRAMIDE HCL 10 MG PO TABS
10.0000 mg | ORAL_TABLET | Freq: Once | ORAL | Status: AC
  Administered 2017-07-28: 10 mg via ORAL
  Filled 2017-07-28: qty 1

## 2017-07-28 MED ORDER — FAMOTIDINE 20 MG PO TABS
40.0000 mg | ORAL_TABLET | Freq: Once | ORAL | Status: AC
  Administered 2017-07-28: 40 mg via ORAL
  Filled 2017-07-28: qty 2

## 2017-07-28 MED ORDER — GI COCKTAIL ~~LOC~~
30.0000 mL | ORAL | Status: AC
  Administered 2017-07-28: 30 mL via ORAL
  Filled 2017-07-28: qty 30

## 2017-07-28 MED ORDER — ALUMINUM-MAGNESIUM-SIMETHICONE 200-200-20 MG/5ML PO SUSP: 30.0000 mL | Freq: Three times a day (TID) | ORAL | 0 refills | Status: DC
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
## 2017-07-28 NOTE — ED Notes (Signed)
NAD noted at time of D/C. Pt denies questions or concerns. Pt ambulatory to the lobby at this time.  

## 2017-07-28 NOTE — ED Notes (Addendum)
Pt c/o intermittent chest pain x 3 days with sudden worsening this morning. Pt states pain worse after eating and he has palpitations. Pt states hx of A-fib. Pt states L sided chest pain worse with palpation. Denies radiation. Pt is alert and oriented, in NAD at this time. Noted to be hypertensive on assessment.

## 2017-07-28 NOTE — ED Triage Notes (Signed)
Patient ambulatory to triage with steady gait, without difficulty or distress noted; pt reports awakening at 430am with heart fluttering and irregular that lasted 45min; st now feels "like a gas pocket" to left side chest

## 2017-07-28 NOTE — ED Notes (Signed)
Patient ambulatory to Room 4 at his request.  Jinny Blossom RN aware of placement in room.

## 2017-08-08 DIAGNOSIS — J309 Allergic rhinitis, unspecified: Secondary | ICD-10-CM | POA: Insufficient documentation

## 2017-08-08 DIAGNOSIS — E669 Obesity, unspecified: Secondary | ICD-10-CM | POA: Insufficient documentation

## 2017-08-08 NOTE — Assessment & Plan Note (Signed)
Encouraged weight loss and DASH guidelines 

## 2017-08-08 NOTE — Assessment & Plan Note (Signed)
Currently in sinus rhythm.  

## 2017-08-08 NOTE — Assessment & Plan Note (Signed)
Work on weight loss over the next several months

## 2017-08-08 NOTE — Assessment & Plan Note (Signed)
Weaning down the narcotic; try good ergonomics

## 2017-08-08 NOTE — Assessment & Plan Note (Signed)
Avoid decongestants 

## 2017-08-08 NOTE — Assessment & Plan Note (Signed)
Weaning down narcotic

## 2017-08-24 ENCOUNTER — Other Ambulatory Visit: Payer: Self-pay

## 2017-08-24 MED ORDER — HYDROCODONE-ACETAMINOPHEN 5-325 MG PO TABS: 0.5000 | ORAL_TABLET | Freq: Four times a day (QID) | ORAL | 0 refills | Status: DC | PRN

## 2017-08-24 NOTE — Telephone Encounter (Signed)
Reviewed PMP Aware Last fill 07/21/17 No other prescribers Okay for refill; sent electronically

## 2017-08-26 ENCOUNTER — Ambulatory Visit: Payer: BLUE CROSS/BLUE SHIELD | Admitting: Family Medicine

## 2017-09-29 ENCOUNTER — Emergency Department
Admission: EM | Admit: 2017-09-29 | Discharge: 2017-09-29 | Disposition: A | Discharge status: Home / Self Care | Payer: BLUE CROSS/BLUE SHIELD | Attending: Emergency Medicine | Admitting: Emergency Medicine

## 2017-09-29 ENCOUNTER — Encounter: Payer: Self-pay | Admitting: Emergency Medicine

## 2017-09-29 ENCOUNTER — Emergency Department: Payer: BLUE CROSS/BLUE SHIELD

## 2017-09-29 DIAGNOSIS — Z7982 Long term (current) use of aspirin: Secondary | ICD-10-CM | POA: Insufficient documentation

## 2017-09-29 DIAGNOSIS — R1013 Epigastric pain: Secondary | ICD-10-CM | POA: Insufficient documentation

## 2017-09-29 DIAGNOSIS — R1084 Generalized abdominal pain: Secondary | ICD-10-CM | POA: Diagnosis not present

## 2017-09-29 DIAGNOSIS — J45909 Unspecified asthma, uncomplicated: Secondary | ICD-10-CM | POA: Diagnosis not present

## 2017-09-29 DIAGNOSIS — R112 Nausea with vomiting, unspecified: Secondary | ICD-10-CM | POA: Diagnosis not present

## 2017-09-29 DIAGNOSIS — I10 Essential (primary) hypertension: Secondary | ICD-10-CM | POA: Insufficient documentation

## 2017-09-29 DIAGNOSIS — Z87891 Personal history of nicotine dependence: Secondary | ICD-10-CM | POA: Diagnosis not present

## 2017-09-29 DIAGNOSIS — R1110 Vomiting, unspecified: Secondary | ICD-10-CM | POA: Diagnosis not present

## 2017-09-29 DIAGNOSIS — R109 Unspecified abdominal pain: Secondary | ICD-10-CM

## 2017-09-29 DIAGNOSIS — Z79899 Other long term (current) drug therapy: Secondary | ICD-10-CM | POA: Diagnosis not present

## 2017-09-29 LAB — URINALYSIS, COMPLETE (UACMP) WITH MICROSCOPIC
BACTERIA UA: NONE SEEN
Bilirubin Urine: NEGATIVE
GLUCOSE, UA: NEGATIVE mg/dL
KETONES UR: NEGATIVE mg/dL
Leukocytes, UA: NEGATIVE
Nitrite: NEGATIVE
PH: 7 (ref 5.0–8.0)
Protein, ur: NEGATIVE mg/dL
SQUAMOUS EPITHELIAL / LPF: NONE SEEN (ref 0–5)
Specific Gravity, Urine: 1.013 (ref 1.005–1.030)

## 2017-09-29 LAB — CBC
HEMATOCRIT: 42.5 % (ref 40.0–52.0)
Hemoglobin: 13.8 g/dL (ref 13.0–18.0)
MCH: 23.4 pg — ABNORMAL LOW (ref 26.0–34.0)
MCHC: 32.6 g/dL (ref 32.0–36.0)
MCV: 71.9 fL — AB (ref 80.0–100.0)
PLATELETS: 406 10*3/uL (ref 150–440)
RBC: 5.92 MIL/uL — ABNORMAL HIGH (ref 4.40–5.90)
RDW: 17.4 % — AB (ref 11.5–14.5)
WBC: 7 10*3/uL (ref 3.8–10.6)

## 2017-09-29 LAB — COMPREHENSIVE METABOLIC PANEL
ALK PHOS: 130 U/L — AB (ref 38–126)
ALT: 46 U/L — AB (ref 0–44)
AST: 27 U/L (ref 15–41)
Albumin: 4 g/dL (ref 3.5–5.0)
Anion gap: 9 (ref 5–15)
BILIRUBIN TOTAL: 0.4 mg/dL (ref 0.3–1.2)
BUN: 16 mg/dL (ref 6–20)
CHLORIDE: 103 mmol/L (ref 98–111)
CO2: 24 mmol/L (ref 22–32)
CREATININE: 1.09 mg/dL (ref 0.61–1.24)
Calcium: 9.1 mg/dL (ref 8.9–10.3)
GFR calc non Af Amer: >60 mL/min (ref >60)
Glucose, Bld: 98 mg/dL (ref 70–99)
Potassium: 3.7 mmol/L (ref 3.5–5.1)
SODIUM: 136 mmol/L (ref 135–145)
Total Protein: 9 g/dL — ABNORMAL HIGH (ref 6.5–8.1)

## 2017-09-29 LAB — LIPASE, BLOOD: Lipase: 24 U/L (ref 11–51)

## 2017-09-29 LAB — TROPONIN I: Troponin I: <0.03 ng/mL (ref <0.03)

## 2017-09-29 MED ORDER — FAMOTIDINE 20 MG PO TABS: 20.0000 mg | ORAL_TABLET | Freq: Two times a day (BID) | ORAL | 1 refills | Status: DC

## 2017-09-29 MED ORDER — GI COCKTAIL ~~LOC~~
30.0000 mL | Freq: Once | ORAL | Status: AC
  Administered 2017-09-29: 30 mL via ORAL
  Filled 2017-09-29: qty 30

## 2017-09-29 MED ORDER — ONDANSETRON 4 MG PO TBDP
4.0000 mg | ORAL_TABLET | Freq: Once | ORAL | Status: AC | PRN
  Administered 2017-09-29: 4 mg via ORAL
  Filled 2017-09-29: qty 1

## 2017-09-29 MED ORDER — IOPAMIDOL (ISOVUE-300) INJECTION 61%
100.0000 mL | Freq: Once | INTRAVENOUS | Status: AC | PRN
  Administered 2017-09-29: 100 mL via INTRAVENOUS
  Filled 2017-09-29: qty 100

## 2017-09-29 MED ORDER — FAMOTIDINE IN NACL 20-0.9 MG/50ML-% IV SOLN
20.0000 mg | Freq: Once | INTRAVENOUS | Status: AC
  Administered 2017-09-29: 20 mg via INTRAVENOUS
  Filled 2017-09-29: qty 50

## 2017-09-29 MED ORDER — ALUM & MAG HYDROXIDE-SIMETH 400-400-40 MG/5ML PO SUSP: 5.0000 mL | Freq: Four times a day (QID) | ORAL | 0 refills | Status: DC | PRN

## 2017-09-29 MED ORDER — ACETAMINOPHEN 500 MG PO TABS
1000.0000 mg | ORAL_TABLET | Freq: Once | ORAL | Status: AC
  Administered 2017-09-29: 1000 mg via ORAL
  Filled 2017-09-29: qty 2

## 2017-09-29 NOTE — Discharge Instructions (Addendum)

## 2017-09-29 NOTE — ED Triage Notes (Signed)
Pt arrived with complaints of upper abdomen/chest pain that started this morning. Pt states the pain is intermittent and feels like a sharp pain when he coughs but otherwise feels like cramps. Pt reports nausea with 1 episode of emesis.
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
## 2017-09-29 NOTE — ED Notes (Signed)
Patient transported to CT 

## 2017-10-04 ENCOUNTER — Other Ambulatory Visit: Payer: Self-pay

## 2017-10-04 MED ORDER — HYDROCODONE-ACETAMINOPHEN 5-325 MG PO TABS: 0.5000 | ORAL_TABLET | Freq: Four times a day (QID) | ORAL | 0 refills | Status: DC | PRN

## 2017-10-04 NOTE — Telephone Encounter (Signed)
Copied from [place] [id number]. Topic: General - Other >> Oct 04, 2017  3:45 PM Oneta Rack wrote: Relation to pt: self Call back number:[phone] Pharmacy:  [place], [place] [phone] (Phone) [phone] (Fax)   Reason for call:  Patient requesting HYDROcodone-acetaminophen (NORCO/VICODIN) 5-325 MG tablet, patient informed please allow 48 to 72 hour turn around, please advise

## 2017-10-04 NOTE — Telephone Encounter (Signed)
Reviewed last visit Reviewed PMP Aware No red flags Rx approved

## 2017-10-05 ENCOUNTER — Telehealth: Payer: Self-pay | Admitting: Family Medicine

## 2017-10-05 NOTE — Telephone Encounter (Signed)
Patient stopped in to office today to request his pain medication Rx be sent to pharmacy. Chart reviewed, and shows PCP [name] approved and sent Norco refill yesterday 10/04/2017.  [place] to follow up, and it is ready for pick up.  Called Dale Kennedy back and let him know.

## 2017-10-11 ENCOUNTER — Inpatient Hospital Stay: Payer: BLUE CROSS/BLUE SHIELD | Attending: Oncology

## 2017-10-11 DIAGNOSIS — R718 Other abnormality of red blood cells: Secondary | ICD-10-CM | POA: Diagnosis not present

## 2017-10-11 DIAGNOSIS — R778 Other specified abnormalities of plasma proteins: Secondary | ICD-10-CM

## 2017-10-11 LAB — BASIC METABOLIC PANEL
Anion gap: 5 (ref 5–15)
BUN: 14 mg/dL (ref 6–20)
CALCIUM: 9 mg/dL (ref 8.9–10.3)
CO2: 24 mmol/L (ref 22–32)
Chloride: 106 mmol/L (ref 98–111)
Creatinine, Ser: 1.17 mg/dL (ref 0.61–1.24)
GFR calc non Af Amer: >60 mL/min (ref >60)
GLUCOSE: 87 mg/dL (ref 70–99)
Potassium: 4.2 mmol/L (ref 3.5–5.1)
Sodium: 135 mmol/L (ref 135–145)

## 2017-10-11 LAB — CBC WITH DIFFERENTIAL/PLATELET
BASOS PCT: 1 %
Basophils Absolute: 0.1 10*3/uL (ref 0–0.1)
EOS ABS: 0.1 10*3/uL (ref 0–0.7)
EOS PCT: 2 %
HCT: 40.7 % (ref 40.0–52.0)
Hemoglobin: 13.1 g/dL (ref 13.0–18.0)
Lymphocytes Relative: 26 %
Lymphs Abs: 1.4 10*3/uL (ref 1.0–3.6)
MCH: 23.1 pg — ABNORMAL LOW (ref 26.0–34.0)
MCHC: 32.1 g/dL (ref 32.0–36.0)
MCV: 71.9 fL — ABNORMAL LOW (ref 80.0–100.0)
MONO ABS: 0.7 10*3/uL (ref 0.2–1.0)
Monocytes Relative: 13 %
NEUTROS PCT: 58 %
Neutro Abs: 3.2 10*3/uL (ref 1.4–6.5)
PLATELETS: 387 10*3/uL (ref 150–440)
RBC: 5.66 MIL/uL (ref 4.40–5.90)
RDW: 17.6 % — AB (ref 11.5–14.5)
WBC: 5.4 10*3/uL (ref 3.8–10.6)

## 2017-10-12 LAB — PROTEIN ELECTROPHORESIS, SERUM
A/G Ratio: 0.8 (ref 0.7–1.7)
ALPHA-1-GLOBULIN: 0.3 g/dL (ref 0.0–0.4)
ALPHA-2-GLOBULIN: 0.7 g/dL (ref 0.4–1.0)
Albumin ELP: 3.4 g/dL (ref 2.9–4.4)
Beta Globulin: 1.3 g/dL (ref 0.7–1.3)
GLOBULIN, TOTAL: 4.4 g/dL — AB (ref 2.2–3.9)
Gamma Globulin: 2.1 g/dL — ABNORMAL HIGH (ref 0.4–1.8)
Total Protein ELP: 7.8 g/dL (ref 6.0–8.5)

## 2017-10-12 LAB — IGG, IGA, IGM
IGA: 355 mg/dL (ref 90–386)
IgG (Immunoglobin G), Serum: 2217 mg/dL — ABNORMAL HIGH (ref 700–1600)
IgM (Immunoglobulin M), Srm: 145 mg/dL (ref 20–172)

## 2017-10-14 ENCOUNTER — Ambulatory Visit: Payer: BLUE CROSS/BLUE SHIELD | Admitting: Family Medicine
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
## 2017-10-18 ENCOUNTER — Inpatient Hospital Stay (HOSPITAL_BASED_OUTPATIENT_CLINIC_OR_DEPARTMENT_OTHER): Payer: BLUE CROSS/BLUE SHIELD | Admitting: Oncology

## 2017-10-18 ENCOUNTER — Encounter: Payer: Self-pay | Admitting: Oncology

## 2017-10-18 VITALS — BP 155/104 | HR 74 | Temp 97.5°F | Resp 18 | Wt 249.3 lb

## 2017-10-18 DIAGNOSIS — R718 Other abnormality of red blood cells: Secondary | ICD-10-CM | POA: Diagnosis not present

## 2017-10-18 NOTE — Progress Notes (Signed)
Pt in for follow up, denies any difficulties or concerns today. 

## 2017-10-19 ENCOUNTER — Encounter: Payer: Self-pay | Admitting: Family Medicine

## 2017-10-19 ENCOUNTER — Ambulatory Visit (INDEPENDENT_AMBULATORY_CARE_PROVIDER_SITE_OTHER): Payer: BLUE CROSS/BLUE SHIELD | Admitting: Family Medicine

## 2017-10-19 VITALS — BP 128/76 | HR 81 | Temp 97.9°F | Resp 18 | Ht 71.0 in | Wt 248.9 lb

## 2017-10-19 DIAGNOSIS — I10 Essential (primary) hypertension: Secondary | ICD-10-CM | POA: Diagnosis not present

## 2017-10-19 DIAGNOSIS — R0683 Snoring: Secondary | ICD-10-CM | POA: Diagnosis not present

## 2017-10-19 NOTE — Patient Instructions (Addendum)
Please check your blood pressure at home every day and bring the log in with you to your next visit. Please focus on your diet and exercise over the next two weeks. Continue your metoprolol as prescribed.  DASH Eating Plan DASH stands for "Dietary Approaches to Stop Hypertension." The DASH eating plan is a healthy eating plan that has been shown to reduce high blood pressure (hypertension). It may also reduce your risk for type 2 diabetes, heart disease, and stroke. The DASH eating plan may also help with weight loss. What are tips for following this plan? General guidelines  Avoid eating more than 2,300 mg (milligrams) of salt (sodium) a day. If you have hypertension, you may need to reduce your sodium intake to 1,500 mg a day.  Limit alcohol intake to no more than 1 drink a day for nonpregnant women and 2 drinks a day for men. One drink equals 12 oz of beer, 5 oz of wine, or 1 oz of hard liquor.  Work with your health care provider to maintain a healthy body weight or to lose weight. Ask what an ideal weight is for you.  Get at least 30 minutes of exercise that causes your heart to beat faster (aerobic exercise) most days of the week. Activities may include walking, swimming, or biking.  Work with your health care provider or diet and nutrition specialist (dietitian) to adjust your eating plan to your individual calorie needs. Reading food labels  Check food labels for the amount of sodium per serving. Choose foods with less than 5 percent of the Daily Value of sodium. Generally, foods with less than 300 mg of sodium per serving fit into this eating plan.  To find whole grains, look for the word "whole" as the first word in the ingredient list. Shopping  Buy products labeled as "low-sodium" or "no salt added."  Buy fresh foods. Avoid canned foods and premade or frozen meals. Cooking  Avoid adding salt when cooking. Use salt-free seasonings or herbs instead of table salt or sea salt.  Check with your health care provider or pharmacist before using salt substitutes.  Do not fry foods. Cook foods using healthy methods such as baking, boiling, grilling, and broiling instead.  Cook with heart-healthy oils, such as olive, canola, soybean, or sunflower oil. Meal planning   Eat a balanced diet that includes: ? 5 or more servings of fruits and vegetables each day. At each meal, try to fill half of your plate with fruits and vegetables. ? Up to 6-8 servings of whole grains each day. ? Less than 6 oz of lean meat, poultry, or fish each day. A 3-oz serving of meat is about the same size as a deck of cards. One egg equals 1 oz. ? 2 servings of low-fat dairy each day. ? A serving of nuts, seeds, or beans 5 times each week. ? Heart-healthy fats. Healthy fats called Omega-3 fatty acids are found in foods such as flaxseeds and coldwater fish, like sardines, salmon, and mackerel.  Limit how much you eat of the following: ? Canned or prepackaged foods. ? Food that is high in trans fat, such as fried foods. ? Food that is high in saturated fat, such as fatty meat. ? Sweets, desserts, sugary drinks, and other foods with added sugar. ? Full-fat dairy products.  Do not salt foods before eating.  Try to eat at least 2 vegetarian meals each week.  Eat more home-cooked food and less restaurant, buffet, and fast food.  When eating at a restaurant, ask that your food be prepared with less salt or no salt, if possible. What foods are recommended? The items listed may not be a complete list. Talk with your dietitian about what dietary choices are best for you. Grains Whole-grain or whole-wheat bread. Whole-grain or whole-wheat pasta. Brown rice. [place] Morrow. Bulgur. Whole-grain and low-sodium cereals. Pita bread. Low-fat, low-sodium crackers. Whole-wheat flour tortillas. Vegetables Fresh or frozen vegetables (raw, steamed, roasted, or grilled). Low-sodium or reduced-sodium tomato and  vegetable juice. Low-sodium or reduced-sodium tomato sauce and tomato paste. Low-sodium or reduced-sodium canned vegetables. Fruits All fresh, dried, or frozen fruit. Canned fruit in natural juice (without added sugar). Meat and other protein foods Skinless chicken or [place]. Ground chicken or [place]. Pork with fat trimmed off. Fish and seafood. Egg whites. Dried beans, peas, or lentils. Unsalted nuts, nut butters, and seeds. Unsalted canned beans. Lean cuts of beef with fat trimmed off. Low-sodium, lean deli meat. Dairy Low-fat (1%) or fat-free (skim) milk. Fat-free, low-fat, or reduced-fat cheeses. Nonfat, low-sodium ricotta or cottage cheese. Low-fat or nonfat yogurt. Low-fat, low-sodium cheese. Fats and oils Soft margarine without trans fats. Vegetable oil. Low-fat, reduced-fat, or light mayonnaise and salad dressings (reduced-sodium). Canola, safflower, olive, soybean, and sunflower oils. Avocado. Seasoning and other foods Herbs. Spices. Seasoning mixes without salt. Unsalted popcorn and pretzels. Fat-free sweets. What foods are not recommended? The items listed may not be a complete list. Talk with your dietitian about what dietary choices are best for you. Grains Baked goods made with fat, such as croissants, muffins, or some breads. Dry pasta or rice meal packs. Vegetables Creamed or fried vegetables. Vegetables in a cheese sauce. Regular canned vegetables (not low-sodium or reduced-sodium). Regular canned tomato sauce and paste (not low-sodium or reduced-sodium). Regular tomato and vegetable juice (not low-sodium or reduced-sodium). Angie Fava. Olives. Fruits Canned fruit in a light or heavy syrup. Fried fruit. Fruit in cream or butter sauce. Meat and other protein foods Fatty cuts of meat. Ribs. Fried meat. Berniece Salines. Sausage. Bologna and other processed lunch meats. Salami. Fatback. Hotdogs. Bratwurst. Salted nuts and seeds. Canned beans with added salt. Canned or smoked fish. Whole eggs or  egg yolks. Chicken or [place] with skin. Dairy Whole or 2% milk, cream, and half-and-half. Whole or full-fat cream cheese. Whole-fat or sweetened yogurt. Full-fat cheese. Nondairy creamers. Whipped toppings. Processed cheese and cheese spreads. Fats and oils Butter. Stick margarine. Lard. Shortening. Ghee. Bacon fat. Tropical oils, such as coconut, palm kernel, or palm oil. Seasoning and other foods Salted popcorn and pretzels. Onion salt, garlic salt, seasoned salt, table salt, and sea salt. Worcestershire sauce. Tartar sauce. Barbecue sauce. Teriyaki sauce. Soy sauce, including reduced-sodium. Steak sauce. Canned and packaged gravies. Fish sauce. Oyster sauce. Cocktail sauce. Horseradish that you find on the shelf. Ketchup. Mustard. Meat flavorings and tenderizers. Bouillon cubes. Hot sauce and Tabasco sauce. Premade or packaged marinades. Premade or packaged taco seasonings. Relishes. Regular salad dressings. Where to find more information:  National Heart, Lung, and [place]: https://wilson-eaton.com/  American Heart Association: www.heart.org Summary  The DASH eating plan is a healthy eating plan that has been shown to reduce high blood pressure (hypertension). It may also reduce your risk for type 2 diabetes, heart disease, and stroke.  With the DASH eating plan, you should limit salt (sodium) intake to 2,300 mg a day. If you have hypertension, you may need to reduce your sodium intake to 1,500 mg a day.  When on the DASH eating plan,  aim to eat more fresh fruits and vegetables, whole grains, lean proteins, low-fat dairy, and heart-healthy fats.  Work with your health care provider or diet and nutrition specialist (dietitian) to adjust your eating plan to your individual calorie needs. This information is not intended to replace advice given to you by your health care provider. Make sure you discuss any questions you have with your health care provider. Document Released: 03/12/2011  Document Revised: 03/16/2016 Document Reviewed: 03/16/2016 Elsevier Interactive Patient Education  [employer].
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
## 2017-10-20 ENCOUNTER — Ambulatory Visit: Payer: BLUE CROSS/BLUE SHIELD | Admitting: Family Medicine

## 2017-10-25 ENCOUNTER — Other Ambulatory Visit: Payer: BLUE CROSS/BLUE SHIELD

## 2017-11-01 ENCOUNTER — Ambulatory Visit: Payer: BLUE CROSS/BLUE SHIELD | Admitting: Oncology

## 2017-11-02 ENCOUNTER — Ambulatory Visit: Payer: BLUE CROSS/BLUE SHIELD | Admitting: Family Medicine

## 2017-11-11 ENCOUNTER — Other Ambulatory Visit: Payer: Self-pay | Admitting: Family Medicine

## 2017-11-11 DIAGNOSIS — E669 Obesity, unspecified: Secondary | ICD-10-CM

## 2017-11-11 DIAGNOSIS — Z79891 Long term (current) use of opiate analgesic: Secondary | ICD-10-CM

## 2017-11-11 DIAGNOSIS — R0683 Snoring: Secondary | ICD-10-CM

## 2017-11-11 NOTE — Progress Notes (Signed)
Please call Dale Kennedy and let him know that they did not approve an in-lab sleep study, but they did approve an in-home sleep study.  I have placed the order. In home sleep study; Auth: 11/11/2017-01/10/2018 Auth #[id number]

## 2017-11-11 NOTE — Progress Notes (Signed)
Patient notified

## 2017-11-20 ENCOUNTER — Emergency Department
Admission: EM | Admit: 2017-11-20 | Discharge: 2017-11-20 | Disposition: A | Discharge status: Home / Self Care | Payer: BLUE CROSS/BLUE SHIELD | Attending: Emergency Medicine | Admitting: Emergency Medicine

## 2017-11-20 ENCOUNTER — Other Ambulatory Visit: Payer: Self-pay

## 2017-11-20 DIAGNOSIS — T783XXA Angioneurotic edema, initial encounter: Secondary | ICD-10-CM | POA: Diagnosis not present

## 2017-11-20 DIAGNOSIS — J301 Allergic rhinitis due to pollen: Secondary | ICD-10-CM | POA: Diagnosis not present

## 2017-11-20 DIAGNOSIS — Z87891 Personal history of nicotine dependence: Secondary | ICD-10-CM | POA: Insufficient documentation

## 2017-11-20 DIAGNOSIS — I10 Essential (primary) hypertension: Secondary | ICD-10-CM | POA: Insufficient documentation

## 2017-11-20 DIAGNOSIS — K122 Cellulitis and abscess of mouth: Secondary | ICD-10-CM | POA: Diagnosis not present

## 2017-11-20 DIAGNOSIS — J302 Other seasonal allergic rhinitis: Secondary | ICD-10-CM | POA: Diagnosis not present

## 2017-11-20 DIAGNOSIS — Z79899 Other long term (current) drug therapy: Secondary | ICD-10-CM | POA: Diagnosis not present

## 2017-11-20 DIAGNOSIS — J45909 Unspecified asthma, uncomplicated: Secondary | ICD-10-CM | POA: Diagnosis not present

## 2017-11-20 DIAGNOSIS — R0683 Snoring: Secondary | ICD-10-CM

## 2017-11-20 DIAGNOSIS — I4891 Unspecified atrial fibrillation: Secondary | ICD-10-CM | POA: Insufficient documentation

## 2017-11-20 MED ORDER — BUPIVACAINE HCL (PF) 0.5 % IJ SOLN
INTRAMUSCULAR | Status: AC
  Filled 2017-11-20: qty 30

## 2017-11-20 MED ORDER — BUPIVACAINE HCL (PF) 0.5 % IJ SOLN
30.0000 mL | Freq: Once | INTRAMUSCULAR | Status: AC
  Administered 2017-11-20: 30 mL

## 2017-11-20 MED ORDER — OXYMETAZOLINE HCL 0.05 % NA SOLN
NASAL | Status: AC
  Filled 2017-11-20: qty 15

## 2017-11-20 MED ORDER — OXYMETAZOLINE HCL 0.05 % NA SOLN
1.0000 | Freq: Once | NASAL | Status: AC
  Administered 2017-11-20: 1 via NASAL
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
## 2017-11-20 NOTE — ED Notes (Signed)
ED Provider at bedside. 

## 2017-11-20 NOTE — Discharge Instructions (Addendum)
Please use over-the-counter cetirizine (Zyrtec) 10 mg twice a day for the next month to help calm down your symptoms.  It is also very important that you try to sleep on your side around your stomach to prevent snoring.  Follow-up with your primary care physician as needed and return to the emergency department for any issues whatsoever.  It was a pleasure to take care of you today, and thank you for coming to our emergency department.  If you have any questions or concerns before leaving please ask the nurse to grab me and I'm more than happy to go through your aftercare instructions again.  If you were prescribed any opioid pain medication today such as Norco, Vicodin, Percocet, morphine, hydrocodone, or oxycodone please make sure you do not drive when you are taking this medication as it can alter your ability to drive safely.  If you have any concerns once you are home that you are not improving or are in fact getting worse before you can make it to your follow-up appointment, please do not hesitate to call [phone] and come back for further evaluation.  Darel Hong, MD

## 2017-11-20 NOTE — ED Triage Notes (Signed)
Patient to ED stating the "thing in the back of his throat is swollen." Stated it started last night. Has been having "real bad reflux lately."

## 2017-11-20 NOTE — ED Notes (Signed)
Reviewed discharge instructions, follow-up care, and OTC medications with patient. Patient verbalized understanding of all information reviewed. Patient stable, with no distress noted at this time.    

## 2017-12-03 ENCOUNTER — Emergency Department: Payer: BLUE CROSS/BLUE SHIELD

## 2017-12-03 ENCOUNTER — Emergency Department
Admission: EM | Admit: 2017-12-03 | Discharge: 2017-12-03 | Disposition: A | Discharge status: Home / Self Care | Payer: BLUE CROSS/BLUE SHIELD | Attending: Emergency Medicine | Admitting: Emergency Medicine

## 2017-12-03 ENCOUNTER — Other Ambulatory Visit: Payer: Self-pay

## 2017-12-03 ENCOUNTER — Encounter: Payer: Self-pay | Admitting: Emergency Medicine

## 2017-12-03 DIAGNOSIS — R079 Chest pain, unspecified: Secondary | ICD-10-CM | POA: Diagnosis not present

## 2017-12-03 DIAGNOSIS — R0789 Other chest pain: Secondary | ICD-10-CM | POA: Diagnosis not present

## 2017-12-03 DIAGNOSIS — Z79899 Other long term (current) drug therapy: Secondary | ICD-10-CM | POA: Insufficient documentation

## 2017-12-03 DIAGNOSIS — Z87891 Personal history of nicotine dependence: Secondary | ICD-10-CM | POA: Insufficient documentation

## 2017-12-03 DIAGNOSIS — R0602 Shortness of breath: Secondary | ICD-10-CM | POA: Diagnosis not present

## 2017-12-03 DIAGNOSIS — J45909 Unspecified asthma, uncomplicated: Secondary | ICD-10-CM | POA: Diagnosis not present

## 2017-12-03 DIAGNOSIS — Z7982 Long term (current) use of aspirin: Secondary | ICD-10-CM | POA: Diagnosis not present

## 2017-12-03 DIAGNOSIS — R1013 Epigastric pain: Secondary | ICD-10-CM | POA: Diagnosis not present

## 2017-12-03 DIAGNOSIS — I10 Essential (primary) hypertension: Secondary | ICD-10-CM | POA: Diagnosis not present

## 2017-12-03 LAB — LIPASE, BLOOD: LIPASE: 21 U/L (ref 11–51)

## 2017-12-03 LAB — COMPREHENSIVE METABOLIC PANEL
ALBUMIN: 4 g/dL (ref 3.5–5.0)
ALK PHOS: 138 U/L — AB (ref 38–126)
ALT: 39 U/L (ref 0–44)
AST: 27 U/L (ref 15–41)
Anion gap: 9 (ref 5–15)
BUN: 11 mg/dL (ref 6–20)
CALCIUM: 9.1 mg/dL (ref 8.9–10.3)
CHLORIDE: 102 mmol/L (ref 98–111)
CO2: 28 mmol/L (ref 22–32)
CREATININE: 1.12 mg/dL (ref 0.61–1.24)
GFR calc non Af Amer: >60 mL/min (ref >60)
Glucose, Bld: 114 mg/dL — ABNORMAL HIGH (ref 70–99)
Potassium: 3.9 mmol/L (ref 3.5–5.1)
SODIUM: 139 mmol/L (ref 135–145)
Total Bilirubin: 0.6 mg/dL (ref 0.3–1.2)
Total Protein: 8.8 g/dL — ABNORMAL HIGH (ref 6.5–8.1)

## 2017-12-03 LAB — CBC WITH DIFFERENTIAL/PLATELET
Basophils Absolute: 0.1 10*3/uL (ref 0–0.1)
Basophils Relative: 1 %
EOS ABS: 0.1 10*3/uL (ref 0–0.7)
EOS PCT: 2 %
HCT: 43.5 % (ref 40.0–52.0)
HEMOGLOBIN: 13.9 g/dL (ref 13.0–18.0)
LYMPHS ABS: 1.4 10*3/uL (ref 1.0–3.6)
LYMPHS PCT: 22 %
MCH: 23.2 pg — AB (ref 26.0–34.0)
MCHC: 32 g/dL (ref 32.0–36.0)
MCV: 72.4 fL — AB (ref 80.0–100.0)
MONOS PCT: 14 %
Monocytes Absolute: 0.9 10*3/uL (ref 0.2–1.0)
Neutro Abs: 4 10*3/uL (ref 1.4–6.5)
Neutrophils Relative %: 61 %
Platelets: 413 10*3/uL (ref 150–440)
RBC: 6 MIL/uL — AB (ref 4.40–5.90)
RDW: 17.4 % — ABNORMAL HIGH (ref 11.5–14.5)
WBC: 6.5 10*3/uL (ref 3.8–10.6)

## 2017-12-03 LAB — TROPONIN I: Troponin I: <0.03 ng/mL (ref <0.03)

## 2017-12-03 MED ORDER — GI COCKTAIL ~~LOC~~
30.0000 mL | Freq: Once | ORAL | Status: AC
  Administered 2017-12-03: 30 mL via ORAL
  Filled 2017-12-03: qty 30

## 2017-12-03 MED ORDER — SUCRALFATE 1 G PO TABS
1.0000 g | ORAL_TABLET | Freq: Once | ORAL | Status: AC
  Administered 2017-12-03: 1 g via ORAL
  Filled 2017-12-03: qty 1

## 2017-12-03 MED ORDER — FAMOTIDINE 20 MG PO TABS: 20.0000 mg | ORAL_TABLET | Freq: Two times a day (BID) | ORAL | 0 refills | Status: DC
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
## 2017-12-03 NOTE — ED Triage Notes (Addendum)
Patient ambulatory to triage with steady gait, without difficulty or distress noted; pt reports mid CP that began upon awakening this morning accomp by [facility]; denies hx of same; 2-ASA taken PTA

## 2017-12-03 NOTE — Discharge Instructions (Signed)
Please take your Pepcid twice a day as prescribed and follow-up with your primary care physician in 2 weeks for recheck.  Return to the emergency department sooner for any concerns.  It was a pleasure to take care of you today, and thank you for coming to our emergency department.  If you have any questions or concerns before leaving please ask the nurse to grab me and I'm more than happy to go through your aftercare instructions again.  If you were prescribed any opioid pain medication today such as Norco, Vicodin, Percocet, morphine, hydrocodone, or oxycodone please make sure you do not drive when you are taking this medication as it can alter your ability to drive safely.  If you have any concerns once you are home that you are not improving or are in fact getting worse before you can make it to your follow-up appointment, please do not hesitate to call 911 and come back for further evaluation.  Darel Hong, MD  Results for orders placed or performed during the hospital encounter of 12/03/17  Comprehensive metabolic panel  Result Value Ref Range   Sodium 139 135 - 145 mmol/L   Potassium 3.9 3.5 - 5.1 mmol/L   Chloride 102 98 - 111 mmol/L   CO2 28 22 - 32 mmol/L   Glucose, Bld 114 (H) 70 - 99 mg/dL   BUN 11 6 - 20 mg/dL   Creatinine, Ser 1.12 0.61 - 1.24 mg/dL   Calcium 9.1 8.9 - 10.3 mg/dL   Total Protein 8.8 (H) 6.5 - 8.1 g/dL   Albumin 4.0 3.5 - 5.0 g/dL   AST 27 15 - 41 U/L   ALT 39 0 - 44 U/L   Alkaline Phosphatase 138 (H) 38 - 126 U/L   Total Bilirubin 0.6 0.3 - 1.2 mg/dL   GFR calc non Af Amer >60 >60 mL/min   GFR calc Af Amer >60 >60 mL/min   Anion gap 9 5 - 15  Troponin I  Result Value Ref Range   Troponin I <0.03 <0.03 ng/mL  CBC with Differential  Result Value Ref Range   WBC 6.5 3.8 - 10.6 K/uL   RBC 6.00 (H) 4.40 - 5.90 MIL/uL   Hemoglobin 13.9 13.0 - 18.0 g/dL   HCT 43.5 40.0 - 52.0 %   MCV 72.4 (L) 80.0 - 100.0 fL   MCH 23.2 (L) 26.0 - 34.0 pg   MCHC 32.0  32.0 - 36.0 g/dL   RDW 17.4 (H) 11.5 - 14.5 %   Platelets 413 150 - 440 K/uL   Neutrophils Relative % 61 %   Neutro Abs 4.0 1.4 - 6.5 K/uL   Lymphocytes Relative 22 %   Lymphs Abs 1.4 1.0 - 3.6 K/uL   Monocytes Relative 14 %   Monocytes Absolute 0.9 0.2 - 1.0 K/uL   Eosinophils Relative 2 %   Eosinophils Absolute 0.1 0 - 0.7 K/uL   Basophils Relative 1 %   Basophils Absolute 0.1 0 - 0.1 K/uL  Lipase, blood  Result Value Ref Range   Lipase 21 11 - 51 U/L   Dg Chest 2 View  Result Date: 12/03/2017 CLINICAL DATA:  42 y/o M; chest pain that began upon awakening this morning accompanied by shortness of breath. EXAM: CHEST - 2 VIEW COMPARISON:  07/28/2017 chest radiograph FINDINGS: Stable heart size and mediastinal contours are within normal limits. Both lungs are clear. The visualized skeletal structures are unremarkable. IMPRESSION: No active cardiopulmonary disease. Electronically Signed   By:  Kristine Garbe M.D.   On: 12/03/2017 06:07

## 2017-12-04 ENCOUNTER — Encounter: Payer: Self-pay | Admitting: Emergency Medicine

## 2017-12-04 ENCOUNTER — Other Ambulatory Visit: Payer: Self-pay

## 2017-12-04 ENCOUNTER — Emergency Department: Payer: BLUE CROSS/BLUE SHIELD

## 2017-12-04 ENCOUNTER — Observation Stay
Admit: 2017-12-04 | Discharge: 2017-12-04 | Disposition: A | Discharge status: Home / Self Care | Payer: BLUE CROSS/BLUE SHIELD | Attending: Family Medicine | Admitting: Family Medicine

## 2017-12-04 ENCOUNTER — Observation Stay
Admission: EM | Admit: 2017-12-04 | Discharge: 2017-12-05 | Disposition: A | Discharge status: Home / Self Care | Payer: BLUE CROSS/BLUE SHIELD | Attending: Internal Medicine | Admitting: Internal Medicine

## 2017-12-04 DIAGNOSIS — F410 Panic disorder [episodic paroxysmal anxiety] without agoraphobia: Secondary | ICD-10-CM | POA: Diagnosis not present

## 2017-12-04 DIAGNOSIS — J45909 Unspecified asthma, uncomplicated: Secondary | ICD-10-CM | POA: Insufficient documentation

## 2017-12-04 DIAGNOSIS — I200 Unstable angina: Secondary | ICD-10-CM

## 2017-12-04 DIAGNOSIS — E785 Hyperlipidemia, unspecified: Secondary | ICD-10-CM | POA: Diagnosis not present

## 2017-12-04 DIAGNOSIS — K219 Gastro-esophageal reflux disease without esophagitis: Secondary | ICD-10-CM | POA: Insufficient documentation

## 2017-12-04 DIAGNOSIS — R7303 Prediabetes: Secondary | ICD-10-CM | POA: Insufficient documentation

## 2017-12-04 DIAGNOSIS — Z7982 Long term (current) use of aspirin: Secondary | ICD-10-CM | POA: Diagnosis not present

## 2017-12-04 DIAGNOSIS — Z87891 Personal history of nicotine dependence: Secondary | ICD-10-CM | POA: Diagnosis not present

## 2017-12-04 DIAGNOSIS — R079 Chest pain, unspecified: Secondary | ICD-10-CM | POA: Diagnosis not present

## 2017-12-04 DIAGNOSIS — G4700 Insomnia, unspecified: Secondary | ICD-10-CM | POA: Diagnosis not present

## 2017-12-04 DIAGNOSIS — R0789 Other chest pain: Secondary | ICD-10-CM | POA: Diagnosis not present

## 2017-12-04 DIAGNOSIS — R778 Other specified abnormalities of plasma proteins: Secondary | ICD-10-CM | POA: Diagnosis not present

## 2017-12-04 DIAGNOSIS — Z79899 Other long term (current) drug therapy: Secondary | ICD-10-CM | POA: Insufficient documentation

## 2017-12-04 DIAGNOSIS — R7989 Other specified abnormal findings of blood chemistry: Secondary | ICD-10-CM | POA: Diagnosis not present

## 2017-12-04 DIAGNOSIS — F419 Anxiety disorder, unspecified: Secondary | ICD-10-CM | POA: Diagnosis not present

## 2017-12-04 DIAGNOSIS — I10 Essential (primary) hypertension: Secondary | ICD-10-CM | POA: Diagnosis not present

## 2017-12-04 LAB — COMPREHENSIVE METABOLIC PANEL
ALK PHOS: 124 U/L (ref 38–126)
ALT: 33 U/L (ref 0–44)
ANION GAP: 7 (ref 5–15)
AST: 22 U/L (ref 15–41)
Albumin: 3.8 g/dL (ref 3.5–5.0)
BILIRUBIN TOTAL: 0.5 mg/dL (ref 0.3–1.2)
BUN: 12 mg/dL (ref 6–20)
CALCIUM: 8.8 mg/dL — AB (ref 8.9–10.3)
CO2: 25 mmol/L (ref 22–32)
Chloride: 105 mmol/L (ref 98–111)
Creatinine, Ser: 1.17 mg/dL (ref 0.61–1.24)
GLUCOSE: 108 mg/dL — AB (ref 70–99)
Potassium: 3.8 mmol/L (ref 3.5–5.1)
SODIUM: 137 mmol/L (ref 135–145)
TOTAL PROTEIN: 8.3 g/dL — AB (ref 6.5–8.1)

## 2017-12-04 LAB — URINE DRUG SCREEN, QUALITATIVE (ARMC ONLY)
AMPHETAMINES, UR SCREEN: NOT DETECTED
BARBITURATES, UR SCREEN: NOT DETECTED
COCAINE METABOLITE, UR ~~LOC~~: NOT DETECTED
Cannabinoid 50 Ng, Ur ~~LOC~~: NOT DETECTED
MDMA (Ecstasy)Ur Screen: NOT DETECTED
Methadone Scn, Ur: NOT DETECTED
OPIATE, UR SCREEN: NOT DETECTED
Phencyclidine (PCP) Ur S: NOT DETECTED
Tricyclic, Ur Screen: NOT DETECTED

## 2017-12-04 LAB — CBC WITH DIFFERENTIAL/PLATELET
BASOS ABS: 0.1 10*3/uL (ref 0–0.1)
BASOS PCT: 1 %
Eosinophils Absolute: 0.1 10*3/uL (ref 0–0.7)
Eosinophils Relative: 1 %
HEMATOCRIT: 41 % (ref 40.0–52.0)
Hemoglobin: 13.5 g/dL (ref 13.0–18.0)
Lymphocytes Relative: 17 %
Lymphs Abs: 1 10*3/uL (ref 1.0–3.6)
MCH: 23.9 pg — ABNORMAL LOW (ref 26.0–34.0)
MCHC: 32.9 g/dL (ref 32.0–36.0)
MCV: 72.7 fL — ABNORMAL LOW (ref 80.0–100.0)
MONO ABS: 0.7 10*3/uL (ref 0.2–1.0)
Monocytes Relative: 12 %
NEUTROS ABS: 4 10*3/uL (ref 1.4–6.5)
Neutrophils Relative %: 69 %
PLATELETS: 372 10*3/uL (ref 150–440)
RBC: 5.64 MIL/uL (ref 4.40–5.90)
RDW: 17.6 % — AB (ref 11.5–14.5)
WBC: 5.9 10*3/uL (ref 3.8–10.6)

## 2017-12-04 LAB — TROPONIN I
TROPONIN I: 0.06 ng/mL — AB (ref <0.03)
Troponin I: 0.05 ng/mL (ref <0.03)
Troponin I: 0.11 ng/mL (ref <0.03)

## 2017-12-04 MED ORDER — DOXYCYCLINE HYCLATE 100 MG PO TABS
100.0000 mg | ORAL_TABLET | Freq: Two times a day (BID) | ORAL | Status: DC
  Administered 2017-12-04 – 2017-12-05 (×3): 100 mg via ORAL
  Filled 2017-12-04 (×2): qty 1

## 2017-12-04 MED ORDER — MORPHINE SULFATE (PF) 2 MG/ML IV SOLN: 2.0000 mg | INTRAVENOUS | Status: DC | PRN

## 2017-12-04 MED ORDER — ONDANSETRON HCL 4 MG/2ML IJ SOLN: 4.0000 mg | Freq: Four times a day (QID) | INTRAMUSCULAR | Status: DC | PRN

## 2017-12-04 MED ORDER — ENOXAPARIN SODIUM 40 MG/0.4ML ~~LOC~~ SOLN
40.0000 mg | SUBCUTANEOUS | Status: DC
  Administered 2017-12-04: 40 mg via SUBCUTANEOUS
  Filled 2017-12-04: qty 0.4

## 2017-12-04 MED ORDER — DOXYCYCLINE HYCLATE 100 MG PO TABS
ORAL_TABLET | ORAL | Status: AC
  Administered 2017-12-04: 100 mg via ORAL
  Filled 2017-12-04: qty 1

## 2017-12-04 MED ORDER — HEPARIN (PORCINE) IN NACL 100-0.45 UNIT/ML-% IJ SOLN
1500.0000 [IU]/h | INTRAMUSCULAR | Status: DC
  Administered 2017-12-05: 1500 [IU]/h via INTRAVENOUS
  Filled 2017-12-04: qty 250

## 2017-12-04 MED ORDER — IPRATROPIUM-ALBUTEROL 0.5-2.5 (3) MG/3ML IN SOLN: 3.0000 mL | RESPIRATORY_TRACT | Status: DC | PRN

## 2017-12-04 MED ORDER — NITROGLYCERIN 0.4 MG SL SUBL: 0.4000 mg | SUBLINGUAL_TABLET | SUBLINGUAL | Status: DC | PRN

## 2017-12-04 MED ORDER — ENOXAPARIN SODIUM 60 MG/0.6ML ~~LOC~~ SOLN
60.0000 mg | Freq: Once | SUBCUTANEOUS | Status: AC
  Administered 2017-12-04: 60 mg via SUBCUTANEOUS
  Filled 2017-12-04: qty 0.6

## 2017-12-04 MED ORDER — EZETIMIBE 10 MG PO TABS
10.0000 mg | ORAL_TABLET | Freq: Every day | ORAL | Status: DC
  Administered 2017-12-04 – 2017-12-05 (×2): 10 mg via ORAL
  Filled 2017-12-04 (×2): qty 1

## 2017-12-04 MED ORDER — ACETAMINOPHEN 325 MG PO TABS: 650.0000 mg | ORAL_TABLET | ORAL | Status: DC | PRN

## 2017-12-04 MED ORDER — ALPRAZOLAM 0.5 MG PO TABS
0.2500 mg | ORAL_TABLET | Freq: Two times a day (BID) | ORAL | Status: DC | PRN
  Administered 2017-12-05: 0.25 mg via ORAL
  Filled 2017-12-04: qty 1

## 2017-12-04 MED ORDER — METOPROLOL SUCCINATE ER 50 MG PO TB24
50.0000 mg | ORAL_TABLET | Freq: Every day | ORAL | Status: DC
Start: 2017-12-04 — End: 2017-12-05
  Administered 2017-12-05: 50 mg via ORAL
  Filled 2017-12-04: qty 1

## 2017-12-04 MED ORDER — ALUM & MAG HYDROXIDE-SIMETH 200-200-20 MG/5ML PO SUSP: 5.0000 mL | Freq: Four times a day (QID) | ORAL | Status: DC | PRN

## 2017-12-04 MED ORDER — GI COCKTAIL ~~LOC~~
30.0000 mL | Freq: Once | ORAL | Status: AC
  Administered 2017-12-04: 30 mL via ORAL
  Filled 2017-12-04: qty 30

## 2017-12-04 MED ORDER — ZOLPIDEM TARTRATE 5 MG PO TABS: 5.0000 mg | ORAL_TABLET | Freq: Every evening | ORAL | Status: DC | PRN

## 2017-12-04 MED ORDER — HYDROCODONE-ACETAMINOPHEN 5-325 MG PO TABS: 0.5000 | ORAL_TABLET | Freq: Four times a day (QID) | ORAL | Status: DC | PRN

## 2017-12-04 MED ORDER — FAMOTIDINE 20 MG PO TABS
20.0000 mg | ORAL_TABLET | Freq: Two times a day (BID) | ORAL | Status: DC
  Administered 2017-12-04 – 2017-12-05 (×2): 20 mg via ORAL
  Filled 2017-12-04 (×2): qty 1

## 2017-12-04 MED ORDER — GI COCKTAIL ~~LOC~~
30.0000 mL | Freq: Four times a day (QID) | ORAL | Status: DC | PRN
  Filled 2017-12-04: qty 30

## 2017-12-04 MED ORDER — LORATADINE 10 MG PO TABS: 10.0000 mg | ORAL_TABLET | Freq: Every day | ORAL | Status: DC | PRN

## 2017-12-04 MED ORDER — ASPIRIN EC 325 MG PO TBEC
325.0000 mg | DELAYED_RELEASE_TABLET | Freq: Every day | ORAL | Status: DC
  Administered 2017-12-05: 325 mg via ORAL
  Filled 2017-12-04: qty 1

## 2017-12-04 MED ORDER — FLUTICASONE PROPIONATE 50 MCG/ACT NA SUSP
2.0000 | Freq: Every day | NASAL | Status: DC | PRN
  Filled 2017-12-04: qty 16

## 2017-12-04 NOTE — Progress Notes (Signed)
CRITICAL VALUE ALERT  Critical Value:  Troponin 0.11  Date & Time Notied:  12/04/2017, 2325  Provider Notified: Dr. Duane Boston  Orders Received/Actions taken: Patient had lovenox and will start Heparin drip in the morning. Patient asymptomatic. RN will continue to monitor.
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
## 2017-12-04 NOTE — Progress Notes (Signed)
CRITICAL VALUE ALERT  Critical Value:  Troponin 0.09  Date & Time Notied:  12/04/2017  Provider Notified: Dr. Leslye Peer   Orders Received/Actions taken: Text page prime MD about patient's troponin being at 0.09, patient is asymptomatic.Pharmacy was consulted for heparin dosing. Pharmacy called RN since patient received 40 mg of Lovenox, will give one time dose of 60 mg Lovenox and then will start heparin drip at 0830 tomorrow. No other concern at the moment. RN will continue to monitor.
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
## 2017-12-04 NOTE — Progress Notes (Signed)
Family Meeting Note  Advance Directive:yes  Today a meeting took place with the Patient.  Patient is able to participate   The following clinical team members were present during this meeting:MD  The following were discussed:Patient's diagnosis:asthma, gerd, anxiety, cp , Patient's progosis: Unable to determine and Goals for treatment: Full Code  Additional follow-up to be provided: prn  Time spent during discussion:20 minutes  Gorden Harms, MD
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
## 2017-12-05 ENCOUNTER — Observation Stay: Payer: BLUE CROSS/BLUE SHIELD

## 2017-12-05 DIAGNOSIS — F419 Anxiety disorder, unspecified: Secondary | ICD-10-CM | POA: Diagnosis not present

## 2017-12-05 DIAGNOSIS — R079 Chest pain, unspecified: Secondary | ICD-10-CM | POA: Diagnosis not present

## 2017-12-05 DIAGNOSIS — K219 Gastro-esophageal reflux disease without esophagitis: Secondary | ICD-10-CM | POA: Diagnosis not present

## 2017-12-05 DIAGNOSIS — J45909 Unspecified asthma, uncomplicated: Secondary | ICD-10-CM | POA: Diagnosis not present

## 2017-12-05 LAB — LIPID PANEL
Cholesterol: 266 mg/dL — ABNORMAL HIGH (ref 0–200)
HDL: 28 mg/dL — AB (ref >40)
LDL CALC: 207 mg/dL — AB (ref 0–99)
Total CHOL/HDL Ratio: 9.5 RATIO
Triglycerides: 157 mg/dL — ABNORMAL HIGH (ref <150)
VLDL: 31 mg/dL (ref 0–40)

## 2017-12-05 LAB — ECHOCARDIOGRAM COMPLETE
HEIGHTINCHES: 71 in
WEIGHTICAEL: 3880.1 [oz_av]

## 2017-12-05 LAB — TROPONIN I: TROPONIN I: 0.07 ng/mL — AB (ref <0.03)

## 2017-12-05 MED ORDER — ATORVASTATIN CALCIUM 40 MG PO TABS: 40.0000 mg | ORAL_TABLET | Freq: Every day | ORAL | 0 refills | Status: DC

## 2017-12-05 MED ORDER — DOXYCYCLINE HYCLATE 100 MG PO TABS: 100.0000 mg | ORAL_TABLET | Freq: Two times a day (BID) | ORAL | Status: DC

## 2017-12-05 MED ORDER — ALPRAZOLAM 0.25 MG PO TABS: 0.2500 mg | ORAL_TABLET | Freq: Two times a day (BID) | ORAL | 0 refills | Status: DC | PRN

## 2017-12-05 MED ORDER — DOXYCYCLINE HYCLATE 100 MG PO TABS: 100.0000 mg | ORAL_TABLET | Freq: Two times a day (BID) | ORAL | 0 refills | Status: AC

## 2017-12-05 MED ORDER — IOHEXOL 350 MG/ML SOLN
75.0000 mL | Freq: Once | INTRAVENOUS | Status: AC | PRN
  Administered 2017-12-05: 75 mL via INTRAVENOUS
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
## 2017-12-05 NOTE — Discharge Instructions (Signed)
Acute Coronary Syndrome °Acute coronary syndrome (ACS) is a serious problem in which there is suddenly not enough blood and oxygen reaching the heart. ACS can result in chest pain or a heart attack. °What are the causes? °This condition may be caused by: °· A buildup of fat and cholesterol inside of the arteries (atherosclerosis). This is the most common cause. The buildup (plaque) can cause the blood vessels in your heart (coronary arteries) to become narrow or blocked. Plaque can also break off to form a clot. °· A coronary spasm. °· A tearing of the coronary artery (spontaneous coronary artery dissection). °· Low blood pressure (hypotension). °· An abnormal heart beat (arrhythmia). °· Using cocaine or methamphetamine. ° °What increases the risk? °The following factors may make you more likely to develop this condition: °· Age. °· History of chest pain, heart attack, or stroke. °· Being male. °· Family history of chest pain, heart disease, or stroke. °· Smoking. °· Inactivity. °· Being overweight. °· High cholesterol. °· High blood pressure (hypertension). °· Diabetes. °· Excessive alcohol use. ° °What are the signs or symptoms? °Common symptoms of this condition include: °· Chest pain. The pain may last long, or may stop and come back (recur). It may feel like: °? Crushing or squeezing. °? Tightness, pressure, fullness, or heaviness. °· Arm, neck, jaw, or back pain. °· Heartburn or indigestion. °· Shortness of breath. °· Nausea. °· Sudden cold sweats. °· Lightheadedness. °· Dizziness. °· Tiredness (fatigue). ° °Sometimes there are no symptoms. °How is this diagnosed? °This condition may be diagnosed through: °· An electrocardiogram (ECG). This test records the impulses of the heart. °· Blood tests. °· A CT scan of the chest. °· A coronary angiogram. This procedure checks for a blockage in the coronary arteries. ° °How is this treated? °Treatment for this condition may include: °· Oxygen. °· Medicines, such  as: °? Antiplatelet medicines and blood-thinning medicines, such as aspirin. These help prevent blood clots. °? Fibrinolytic therapy. This breaks apart a blood clot. °? Blood pressure medicines. °? Nitroglycerin. °? Pain medicine. °? Cholesterol medicine. °· A procedure called coronary angioplasty and stenting. This is done to widen a narrowed artery and keep it open. °· Coronary artery bypass surgery. This allows blood to pass the blockage to reach your heart. °· Cardiac rehabilitation. This is a program that helps improve your health and well-being. It includes exercise training, education, and counseling to help you recover. ° °Follow these instructions at home: °Eating and drinking °· Follow a heart-healthy, low-salt (sodium) diet. °· Use healthy cooking methods such as roasting, grilling, broiling, baking, poaching, steaming, or stir-frying. °· Talk to a dietitian to learn about healthy cooking methods and how to eat less sodium. °Medicines °· Take over-the-counter and prescription medicines only as told by your health care provider. °· Do not take these medicines unless your health care provider approves: °? Nonsteroidal anti-inflammatory drugs (NSAIDs), such as ibuprofen, naproxen, or celecoxib. °? Vitamin supplements that contain vitamin A or vitamin E. °? Hormone replacement therapy that contains estrogen. °Activity °· Join a cardiac rehabilitation program. °· Ask your health care provider: °? What activities and exercises are safe for you. °? If you should follow specific instructions about lifting, driving, or climbing stairs. °· If you are taking aspirin and another blood thinning medicine, avoid activities that are likely to result in an injury. The medicines can increase your risk of bleeding. °Lifestyle °· Do not use any products that contain nicotine or tobacco, such as cigarettes   and e-cigarettes. If you need help quitting, ask your health care provider. °· If you drink alcohol and your health care  provider says it is okay to drink, limit your alcohol intake to no more than 1 drink per day. One drink equals 12 oz of beer, 5 oz of wine, or 1½ oz of hard liquor. °· Maintain a healthy weight. If you need to lose weight, do it in a way that has been approved by your health care provider. °General instructions °· Tell all your health care providers about your heart condition, including your dentist. Some medicines can increase your risk of arrhythmia. °· Manage other health conditions, such as hypertension and diabetes. These conditions affect your heart. °· Learn ways to manage stress. °· Get screened for depression, and seek treatment if needed. °· Monitor your blood pressure if told by your health care provider. °· Keep your vaccinations up to date. Get the annual influenza vaccine. °· Keep all follow-up visits as told by your health care provider. This is important. °Contact a health care provider if: °· You feel overwhelmed or sad. °· You have trouble with your daily activities. °Get help right away if: °· You have pain in your chest, neck, arm, jaw, stomach, or back that recurs, and: °? Lasts more than a few minutes. °? Is not relieved by taking the medicineyour health care provider prescribed. °· You have unexplained: °? Heavy sweating. °? Heartburn or indigestion. °? Shortness of breath. °? Difficulty breathing. °? Nausea or vomiting. °? Fatigue. °? Nervousness or anxiety. °? Weakness. °? Diarrhea. °? Dark stools or blood in the stool. °· You have sudden lightheadedness or dizziness. °· Your blood pressure is higher than 180/120 °· You faint. °· You feel like hurting yourself or think about taking your own life. °These symptoms may represent a serious problem that is an emergency. Do not wait to see if the symptoms will go away. Get medical help right away. Call your local emergency services (911 in the [place] Do not drive yourself to the clinic or hospital. °Summary °· Acute coronary syndrome (ACS) is a  when there is not enough blood and oxygen being supplied to the heart. ACS can result in chest pain or a heart attack. °· Acute coronary syndrome is a medical emergency. If you have any symptoms of this condition, get help right away. °· Treatment includes oxygen, medicines, and procedures to open the blocked arteries and restore blood flow. °This information is not intended to replace advice given to you by your health care provider. Make sure you discuss any questions you have with your health care provider. °Document Released: 03/23/2005 Document Revised: 04/24/2016 Document Reviewed: 04/24/2016 °Elsevier Interactive Patient Education © 2018 [employer] ° °

## 2017-12-05 NOTE — Plan of Care (Signed)
  Problem: Education: Goal: Understanding of cardiac disease, CV risk reduction, and recovery process will improve Outcome: Progressing   Problem: Activity: Goal: Ability to tolerate increased activity will improve Outcome: Progressing     

## 2017-12-05 NOTE — Progress Notes (Signed)
Pt discharged home with wife, VSS, pt to follow up with cardiology this week. Pt educated on new prescriptions and medication regimen. No complaints at this time, pt does appear anxious about having a blood thinner, this was discussed with his MD, and pt will continue to take asa.

## 2017-12-05 NOTE — Progress Notes (Signed)
Dale Kennedy at [facility] was admitted to the Hospital on 12/04/2017 and Discharged  12/05/2017 and should be excused from work/school until 12/07/2017   Call Saundra Shelling MD with questions.  Saundra Shelling M.D on 12/05/2017,at 11:45 AM  [place] [facility]  [phone]

## 2017-12-07 ENCOUNTER — Ambulatory Visit
Admit: 2017-12-07 | Discharge: 2017-12-11 | Disposition: A | Discharge status: Home / Self Care | Payer: BLUE CROSS/BLUE SHIELD

## 2017-12-07 ENCOUNTER — Telehealth: Payer: Self-pay

## 2017-12-07 ENCOUNTER — Encounter
Admit: 2017-12-07 | Discharge: 2017-12-11 | Disposition: A | Discharge status: Home / Self Care | Payer: BLUE CROSS/BLUE SHIELD

## 2017-12-07 DIAGNOSIS — R079 Chest pain, unspecified: Principal | ICD-10-CM

## 2017-12-07 DIAGNOSIS — E669 Obesity, unspecified: Secondary | ICD-10-CM | POA: Diagnosis not present

## 2017-12-07 DIAGNOSIS — F419 Anxiety disorder, unspecified: Secondary | ICD-10-CM | POA: Diagnosis not present

## 2017-12-07 DIAGNOSIS — I2510 Atherosclerotic heart disease of native coronary artery without angina pectoris: Secondary | ICD-10-CM | POA: Diagnosis not present

## 2017-12-07 DIAGNOSIS — E785 Hyperlipidemia, unspecified: Secondary | ICD-10-CM | POA: Diagnosis not present

## 2017-12-07 DIAGNOSIS — I480 Paroxysmal atrial fibrillation: Secondary | ICD-10-CM | POA: Diagnosis not present

## 2017-12-07 DIAGNOSIS — Z7982 Long term (current) use of aspirin: Secondary | ICD-10-CM | POA: Diagnosis not present

## 2017-12-07 DIAGNOSIS — I499 Cardiac arrhythmia, unspecified: Secondary | ICD-10-CM | POA: Diagnosis not present

## 2017-12-07 DIAGNOSIS — K219 Gastro-esophageal reflux disease without esophagitis: Secondary | ICD-10-CM | POA: Diagnosis not present

## 2017-12-07 DIAGNOSIS — I214 Non-ST elevation (NSTEMI) myocardial infarction: Secondary | ICD-10-CM | POA: Diagnosis not present

## 2017-12-07 DIAGNOSIS — Z6833 Body mass index (BMI) 33.0-33.9, adult: Secondary | ICD-10-CM | POA: Diagnosis not present

## 2017-12-07 DIAGNOSIS — I10 Essential (primary) hypertension: Secondary | ICD-10-CM | POA: Diagnosis not present

## 2017-12-07 DIAGNOSIS — R0789 Other chest pain: Secondary | ICD-10-CM | POA: Diagnosis not present

## 2017-12-07 DIAGNOSIS — R9431 Abnormal electrocardiogram [ECG] [EKG]: Secondary | ICD-10-CM | POA: Diagnosis not present

## 2017-12-07 DIAGNOSIS — I4891 Unspecified atrial fibrillation: Secondary | ICD-10-CM | POA: Diagnosis not present

## 2017-12-07 DIAGNOSIS — E875 Hyperkalemia: Secondary | ICD-10-CM | POA: Diagnosis not present

## 2017-12-07 DIAGNOSIS — I25110 Atherosclerotic heart disease of native coronary artery with unstable angina pectoris: Secondary | ICD-10-CM | POA: Diagnosis not present

## 2017-12-07 LAB — HIV ANTIBODY (ROUTINE TESTING W REFLEX): HIV Screen 4th Generation wRfx: NONREACTIVE

## 2017-12-07 LAB — TROPONIN I: Troponin I: 0.09 ng/mL (ref <0.03)

## 2017-12-07 NOTE — Telephone Encounter (Signed)
TOC #1. Called pt to f/u after d/c from [facility] on 12/05/17. At the time of this entry, pt still has not scheduled a f/u appt with [name]. Discharge planning includes the following:  - Continue oral doxy - Begin Xanax, Lipitor and Zetia - Stop Norco - Consider stress test and cath - F/u [name] in 1 week - F/u Dr. Clayborn Bigness in 1 week  LVM requesting returned call.

## 2017-12-07 NOTE — Telephone Encounter (Signed)
Called patient to give him an appt for hosp fu for 12-13-17 at 9:20 and he said that he could not come at that time  And that he is still not feeling well and is going to go to [facility] to get a second opinion and would call back.

## 2017-12-08 DIAGNOSIS — I214 Non-ST elevation (NSTEMI) myocardial infarction: Secondary | ICD-10-CM | POA: Diagnosis not present

## 2017-12-08 DIAGNOSIS — E785 Hyperlipidemia, unspecified: Secondary | ICD-10-CM | POA: Diagnosis not present

## 2017-12-08 DIAGNOSIS — I480 Paroxysmal atrial fibrillation: Secondary | ICD-10-CM | POA: Diagnosis not present

## 2017-12-08 DIAGNOSIS — R079 Chest pain, unspecified: Secondary | ICD-10-CM | POA: Diagnosis not present

## 2017-12-08 DIAGNOSIS — I25110 Atherosclerotic heart disease of native coronary artery with unstable angina pectoris: Secondary | ICD-10-CM | POA: Diagnosis not present

## 2017-12-08 DIAGNOSIS — E875 Hyperkalemia: Secondary | ICD-10-CM | POA: Diagnosis not present

## 2017-12-08 DIAGNOSIS — I2510 Atherosclerotic heart disease of native coronary artery without angina pectoris: Secondary | ICD-10-CM | POA: Diagnosis not present

## 2017-12-09 DIAGNOSIS — R079 Chest pain, unspecified: Secondary | ICD-10-CM | POA: Diagnosis not present

## 2017-12-09 DIAGNOSIS — R9431 Abnormal electrocardiogram [ECG] [EKG]: Secondary | ICD-10-CM | POA: Diagnosis not present

## 2017-12-09 DIAGNOSIS — I10 Essential (primary) hypertension: Secondary | ICD-10-CM | POA: Diagnosis not present

## 2017-12-09 DIAGNOSIS — I480 Paroxysmal atrial fibrillation: Secondary | ICD-10-CM | POA: Diagnosis not present

## 2017-12-09 DIAGNOSIS — I25110 Atherosclerotic heart disease of native coronary artery with unstable angina pectoris: Secondary | ICD-10-CM | POA: Diagnosis not present

## 2017-12-10 DIAGNOSIS — R079 Chest pain, unspecified: Principal | ICD-10-CM

## 2017-12-10 DIAGNOSIS — I25110 Atherosclerotic heart disease of native coronary artery with unstable angina pectoris: Secondary | ICD-10-CM | POA: Diagnosis not present

## 2017-12-10 DIAGNOSIS — I10 Essential (primary) hypertension: Secondary | ICD-10-CM | POA: Diagnosis not present

## 2017-12-10 DIAGNOSIS — I4891 Unspecified atrial fibrillation: Secondary | ICD-10-CM | POA: Diagnosis not present

## 2017-12-10 DIAGNOSIS — K219 Gastro-esophageal reflux disease without esophagitis: Secondary | ICD-10-CM | POA: Diagnosis not present

## 2017-12-10 DIAGNOSIS — I2510 Atherosclerotic heart disease of native coronary artery without angina pectoris: Secondary | ICD-10-CM | POA: Diagnosis not present

## 2017-12-10 MED ORDER — CARVEDILOL 6.25 MG TABLET
ORAL_TABLET | Freq: Two times a day (BID) | ORAL | 11 refills | 0.00000 days | Status: CP
Start: 2017-12-10 — End: 2018-01-07

## 2017-12-10 MED ORDER — EZETIMIBE 10 MG TABLET
ORAL_TABLET | Freq: Every evening | ORAL | 11 refills | 0 days | Status: CP
Start: 2017-12-10 — End: ?

## 2017-12-10 MED ORDER — ASPIRIN 81 MG CHEWABLE TABLET
ORAL_TABLET | Freq: Every day | ORAL | 11 refills | 0.00000 days | Status: CP
Start: 2017-12-10 — End: ?

## 2017-12-11 DIAGNOSIS — R079 Chest pain, unspecified: Secondary | ICD-10-CM | POA: Diagnosis not present

## 2017-12-11 DIAGNOSIS — E785 Hyperlipidemia, unspecified: Secondary | ICD-10-CM | POA: Diagnosis not present

## 2017-12-11 DIAGNOSIS — I25110 Atherosclerotic heart disease of native coronary artery with unstable angina pectoris: Secondary | ICD-10-CM | POA: Diagnosis not present

## 2017-12-11 DIAGNOSIS — I4891 Unspecified atrial fibrillation: Secondary | ICD-10-CM | POA: Diagnosis not present

## 2017-12-11 DIAGNOSIS — I10 Essential (primary) hypertension: Secondary | ICD-10-CM | POA: Diagnosis not present

## 2017-12-11 HISTORY — PX: RIGHT AND LEFT HEART CATH: CATH118262

## 2017-12-11 MED ORDER — NITROGLYCERIN 0.4 MG SUBLINGUAL TABLET
ORAL_TABLET | SUBLINGUAL | 2 refills | 0 days | Status: CP | PRN
Start: 2017-12-11 — End: ?

## 2017-12-11 MED ORDER — SERTRALINE 25 MG TABLET
ORAL_TABLET | Freq: Every day | ORAL | 11 refills | 0.00000 days | Status: CP
Start: 2017-12-11 — End: 2017-12-30

## 2017-12-11 MED ORDER — ATORVASTATIN 80 MG TABLET
ORAL_TABLET | Freq: Every day | ORAL | 11 refills | 0.00000 days | Status: CP
Start: 2017-12-11 — End: 2017-12-30

## 2017-12-11 MED ORDER — LOSARTAN 25 MG TABLET
ORAL_TABLET | Freq: Every day | ORAL | 11 refills | 0.00000 days | Status: CP
Start: 2017-12-11 — End: 2017-12-11

## 2017-12-12 ENCOUNTER — Encounter: Payer: Self-pay | Admitting: Family Medicine

## 2017-12-12 DIAGNOSIS — Z955 Presence of coronary angioplasty implant and graft: Secondary | ICD-10-CM

## 2017-12-12 DIAGNOSIS — I2510 Atherosclerotic heart disease of native coronary artery without angina pectoris: Secondary | ICD-10-CM

## 2017-12-12 HISTORY — DX: Atherosclerotic heart disease of native coronary artery without angina pectoris: I25.10

## 2017-12-12 HISTORY — DX: Presence of coronary angioplasty implant and graft: Z95.5

## 2017-12-12 MED ORDER — ENOXAPARIN SODIUM 40 MG/0.4ML ~~LOC~~ SOLN
40.00 | SUBCUTANEOUS | Status: DC
Start: 2017-12-12 — End: 2017-12-12

## 2017-12-12 MED ORDER — ACETAMINOPHEN 325 MG PO TABS
650.00 | ORAL_TABLET | ORAL | Status: DC
Start: ? — End: 2017-12-12

## 2017-12-12 MED ORDER — CETIRIZINE HCL 10 MG PO TABS
10.00 | ORAL_TABLET | ORAL | Status: DC
Start: ? — End: 2017-12-12

## 2017-12-12 MED ORDER — ATORVASTATIN CALCIUM 80 MG PO TABS
80.00 | ORAL_TABLET | ORAL | Status: DC
Start: 2017-12-12 — End: 2017-12-12

## 2017-12-12 MED ORDER — ASPIRIN 81 MG PO CHEW
81.00 | CHEWABLE_TABLET | ORAL | Status: DC
Start: 2017-12-12 — End: 2017-12-12

## 2017-12-12 MED ORDER — CARVEDILOL 6.25 MG PO TABS
6.25 | ORAL_TABLET | ORAL | Status: DC
Start: 2017-12-11 — End: 2017-12-12

## 2017-12-12 MED ORDER — SERTRALINE HCL 25 MG PO TABS
25.00 | ORAL_TABLET | ORAL | Status: DC
Start: 2017-12-12 — End: 2017-12-12

## 2017-12-12 MED ORDER — CLOPIDOGREL BISULFATE 75 MG PO TABS
75.00 | ORAL_TABLET | ORAL | Status: DC
Start: 2017-12-12 — End: 2017-12-12

## 2017-12-12 MED ORDER — POLYETHYLENE GLYCOL 3350 17 G PO PACK
17.00 | PACK | ORAL | Status: DC
Start: ? — End: 2017-12-12

## 2017-12-12 MED ORDER — NITROGLYCERIN 0.4 MG SL SUBL
0.40 | SUBLINGUAL_TABLET | SUBLINGUAL | Status: DC
Start: ? — End: 2017-12-12

## 2017-12-12 MED ORDER — LOSARTAN POTASSIUM 25 MG PO TABS
50.00 | ORAL_TABLET | ORAL | Status: DC
Start: 2017-12-12 — End: 2017-12-12

## 2017-12-12 MED ORDER — EZETIMIBE 10 MG PO TABS
10.00 | ORAL_TABLET | ORAL | Status: DC
Start: 2017-12-11 — End: 2017-12-12

## 2017-12-12 MED ORDER — ALPRAZOLAM 0.5 MG PO TABS
0.25 | ORAL_TABLET | ORAL | Status: DC
Start: ? — End: 2017-12-12

## 2017-12-12 MED ORDER — FAMOTIDINE 20 MG PO TABS
20.00 | ORAL_TABLET | ORAL | Status: DC
Start: 2017-12-11 — End: 2017-12-12

## 2017-12-12 MED ORDER — CLOPIDOGREL 75 MG TABLET
ORAL_TABLET | Freq: Every day | ORAL | 11 refills | 0 days | Status: CP
Start: 2017-12-12 — End: 2018-11-30

## 2017-12-12 MED ORDER — LOSARTAN 50 MG TABLET
ORAL_TABLET | Freq: Every day | ORAL | 11 refills | 0.00000 days | Status: CP
Start: 2017-12-12 — End: 2018-04-13

## 2017-12-12 NOTE — Telephone Encounter (Signed)
I have returned from being out of the office for two weeks [facility] notes reviewed; multivessel CAD and placement of DES Problem list updated Please let patient know that I reviewed his [facility] notes and look forward to seeing him for hospital follow-up in the next week

## 2017-12-12 NOTE — Addendum Note (Signed)
Addended by: LADA, Satira Anis on: 12/12/2017 08:52 AM   Modules accepted: Orders

## 2017-12-13 ENCOUNTER — Encounter
Admit: 2017-12-13 | Discharge: 2017-12-14 | Disposition: A | Discharge status: Home / Self Care | Payer: BLUE CROSS/BLUE SHIELD

## 2017-12-13 ENCOUNTER — Ambulatory Visit
Admit: 2017-12-13 | Discharge: 2017-12-14 | Disposition: A | Discharge status: Home / Self Care | Payer: BLUE CROSS/BLUE SHIELD

## 2017-12-13 ENCOUNTER — Encounter
Admit: 2017-12-13 | Discharge: 2017-12-13 | Disposition: A | Discharge status: Home / Self Care | Payer: BLUE CROSS/BLUE SHIELD

## 2017-12-13 DIAGNOSIS — R6883 Chills (without fever): Principal | ICD-10-CM

## 2017-12-13 DIAGNOSIS — R0602 Shortness of breath: Principal | ICD-10-CM

## 2017-12-13 DIAGNOSIS — I119 Hypertensive heart disease without heart failure: Secondary | ICD-10-CM | POA: Diagnosis not present

## 2017-12-13 DIAGNOSIS — Z6833 Body mass index (BMI) 33.0-33.9, adult: Secondary | ICD-10-CM | POA: Diagnosis not present

## 2017-12-13 DIAGNOSIS — Z7982 Long term (current) use of aspirin: Secondary | ICD-10-CM | POA: Diagnosis not present

## 2017-12-13 DIAGNOSIS — R079 Chest pain, unspecified: Secondary | ICD-10-CM | POA: Diagnosis not present

## 2017-12-13 DIAGNOSIS — F419 Anxiety disorder, unspecified: Secondary | ICD-10-CM | POA: Diagnosis not present

## 2017-12-13 DIAGNOSIS — I2510 Atherosclerotic heart disease of native coronary artery without angina pectoris: Secondary | ICD-10-CM | POA: Diagnosis not present

## 2017-12-13 DIAGNOSIS — I10 Essential (primary) hypertension: Secondary | ICD-10-CM | POA: Diagnosis not present

## 2017-12-13 DIAGNOSIS — Z79899 Other long term (current) drug therapy: Secondary | ICD-10-CM | POA: Diagnosis not present

## 2017-12-13 DIAGNOSIS — Z7902 Long term (current) use of antithrombotics/antiplatelets: Secondary | ICD-10-CM | POA: Diagnosis not present

## 2017-12-13 DIAGNOSIS — Z955 Presence of coronary angioplasty implant and graft: Secondary | ICD-10-CM | POA: Diagnosis not present

## 2017-12-13 DIAGNOSIS — R001 Bradycardia, unspecified: Secondary | ICD-10-CM | POA: Diagnosis not present

## 2017-12-13 DIAGNOSIS — I4891 Unspecified atrial fibrillation: Secondary | ICD-10-CM | POA: Diagnosis not present

## 2017-12-13 DIAGNOSIS — E785 Hyperlipidemia, unspecified: Secondary | ICD-10-CM | POA: Diagnosis not present

## 2017-12-13 DIAGNOSIS — Z888 Allergy status to other drugs, medicaments and biological substances status: Secondary | ICD-10-CM | POA: Diagnosis not present

## 2017-12-14 NOTE — Telephone Encounter (Signed)
Please advise as to where you would like for me to place the patient. You are completely booked.

## 2017-12-14 NOTE — Telephone Encounter (Signed)
Spoke with pt and scheduled him an appointment for 9.11.19

## 2017-12-15 ENCOUNTER — Other Ambulatory Visit: Payer: Self-pay

## 2017-12-15 ENCOUNTER — Other Ambulatory Visit
Admission: RE | Admit: 2017-12-15 | Discharge: 2017-12-15 | Disposition: A | Discharge status: Home / Self Care | Payer: BLUE CROSS/BLUE SHIELD | Source: Ambulatory Visit | Attending: Family Medicine | Admitting: Family Medicine

## 2017-12-15 ENCOUNTER — Ambulatory Visit
Admission: RE | Admit: 2017-12-15 | Discharge: 2017-12-15 | Disposition: A | Discharge status: Home / Self Care | Payer: BLUE CROSS/BLUE SHIELD | Source: Ambulatory Visit | Attending: Family Medicine | Admitting: Family Medicine

## 2017-12-15 ENCOUNTER — Encounter: Payer: Self-pay | Admitting: Family Medicine

## 2017-12-15 ENCOUNTER — Emergency Department
Admission: EM | Admit: 2017-12-15 | Discharge: 2017-12-15 | Disposition: A | Discharge status: Home / Self Care | Payer: BLUE CROSS/BLUE SHIELD | Attending: Student in an Organized Health Care Education/Training Program | Admitting: Student in an Organized Health Care Education/Training Program

## 2017-12-15 ENCOUNTER — Emergency Department: Payer: BLUE CROSS/BLUE SHIELD

## 2017-12-15 ENCOUNTER — Ambulatory Visit (INDEPENDENT_AMBULATORY_CARE_PROVIDER_SITE_OTHER): Payer: BLUE CROSS/BLUE SHIELD | Admitting: Family Medicine

## 2017-12-15 ENCOUNTER — Encounter: Payer: Self-pay | Admitting: Emergency Medicine

## 2017-12-15 ENCOUNTER — Encounter
Admit: 2017-12-15 | Discharge: 2017-12-15 | Disposition: A | Discharge status: Home / Self Care | Payer: BLUE CROSS/BLUE SHIELD

## 2017-12-15 VITALS — BP 142/88 | HR 95 | Temp 98.4°F | Resp 12 | Ht 71.0 in | Wt 238.6 lb

## 2017-12-15 DIAGNOSIS — Z87891 Personal history of nicotine dependence: Secondary | ICD-10-CM | POA: Insufficient documentation

## 2017-12-15 DIAGNOSIS — R079 Chest pain, unspecified: Secondary | ICD-10-CM | POA: Diagnosis not present

## 2017-12-15 DIAGNOSIS — J45909 Unspecified asthma, uncomplicated: Secondary | ICD-10-CM | POA: Insufficient documentation

## 2017-12-15 DIAGNOSIS — F419 Anxiety disorder, unspecified: Secondary | ICD-10-CM | POA: Diagnosis not present

## 2017-12-15 DIAGNOSIS — Z7982 Long term (current) use of aspirin: Secondary | ICD-10-CM | POA: Insufficient documentation

## 2017-12-15 DIAGNOSIS — I2510 Atherosclerotic heart disease of native coronary artery without angina pectoris: Secondary | ICD-10-CM | POA: Insufficient documentation

## 2017-12-15 DIAGNOSIS — I509 Heart failure, unspecified: Secondary | ICD-10-CM | POA: Diagnosis not present

## 2017-12-15 DIAGNOSIS — Z79899 Other long term (current) drug therapy: Secondary | ICD-10-CM | POA: Insufficient documentation

## 2017-12-15 DIAGNOSIS — R0602 Shortness of breath: Secondary | ICD-10-CM

## 2017-12-15 DIAGNOSIS — R0600 Dyspnea, unspecified: Secondary | ICD-10-CM

## 2017-12-15 DIAGNOSIS — Z955 Presence of coronary angioplasty implant and graft: Secondary | ICD-10-CM | POA: Diagnosis not present

## 2017-12-15 DIAGNOSIS — R718 Other abnormality of red blood cells: Secondary | ICD-10-CM

## 2017-12-15 DIAGNOSIS — I110 Hypertensive heart disease with heart failure: Secondary | ICD-10-CM | POA: Insufficient documentation

## 2017-12-15 DIAGNOSIS — Z789 Other specified health status: Secondary | ICD-10-CM | POA: Diagnosis not present

## 2017-12-15 DIAGNOSIS — I119 Hypertensive heart disease without heart failure: Secondary | ICD-10-CM | POA: Diagnosis not present

## 2017-12-15 DIAGNOSIS — R002 Palpitations: Secondary | ICD-10-CM | POA: Diagnosis not present

## 2017-12-15 DIAGNOSIS — E782 Mixed hyperlipidemia: Secondary | ICD-10-CM

## 2017-12-15 DIAGNOSIS — Z95820 Peripheral vascular angioplasty status with implants and grafts: Secondary | ICD-10-CM

## 2017-12-15 HISTORY — DX: Heart failure, unspecified: I50.9

## 2017-12-15 LAB — CBC WITH DIFFERENTIAL/PLATELET
BASOS ABS: 0 10*3/uL (ref 0–0.1)
BASOS PCT: 1 %
BASOS PCT: 1 %
Basophils Absolute: 0.1 10*3/uL (ref 0–0.1)
EOS ABS: 0.1 10*3/uL (ref 0–0.7)
EOS PCT: 1 %
Eosinophils Absolute: 0.1 10*3/uL (ref 0–0.7)
Eosinophils Relative: 1 %
HCT: 40.5 % (ref 40.0–52.0)
HEMATOCRIT: 41.2 % (ref 40.0–52.0)
HEMOGLOBIN: 13.2 g/dL (ref 13.0–18.0)
Hemoglobin: 13.1 g/dL (ref 13.0–18.0)
Lymphocytes Relative: 15 %
Lymphocytes Relative: 15 %
Lymphs Abs: 1.1 10*3/uL (ref 1.0–3.6)
Lymphs Abs: 1.1 10*3/uL (ref 1.0–3.6)
MCH: 23.4 pg — ABNORMAL LOW (ref 26.0–34.0)
MCH: 23.5 pg — ABNORMAL LOW (ref 26.0–34.0)
MCHC: 32.1 g/dL (ref 32.0–36.0)
MCHC: 32.2 g/dL (ref 32.0–36.0)
MCV: 72.8 fL — ABNORMAL LOW (ref 80.0–100.0)
MCV: 72.8 fL — ABNORMAL LOW (ref 80.0–100.0)
MONO ABS: 0.9 10*3/uL (ref 0.2–1.0)
MONOS PCT: 13 %
MONOS PCT: 13 %
Monocytes Absolute: 1 10*3/uL (ref 0.2–1.0)
NEUTROS ABS: 5.3 10*3/uL (ref 1.4–6.5)
NEUTROS PCT: 70 %
Neutro Abs: 5.1 10*3/uL (ref 1.4–6.5)
Neutrophils Relative %: 70 %
PLATELETS: 375 10*3/uL (ref 150–440)
Platelets: 369 10*3/uL (ref 150–440)
RBC: 5.66 MIL/uL (ref 4.40–5.90)
RBC: 5.56 MIL/uL (ref 4.40–5.90)
RDW: 17.9 % — ABNORMAL HIGH (ref 11.5–14.5)
RDW: 17.9 % — AB (ref 11.5–14.5)
WBC: 7.5 10*3/uL (ref 3.8–10.6)
WBC: 7.3 10*3/uL (ref 3.8–10.6)

## 2017-12-15 LAB — COMPREHENSIVE METABOLIC PANEL
ALBUMIN: 4.1 g/dL (ref 3.5–5.0)
ALT: 28 U/L (ref 0–44)
ANION GAP: 6 (ref 5–15)
AST: 22 U/L (ref 15–41)
Alkaline Phosphatase: 113 U/L (ref 38–126)
BILIRUBIN TOTAL: 0.7 mg/dL (ref 0.3–1.2)
BUN: 10 mg/dL (ref 6–20)
CHLORIDE: 107 mmol/L (ref 98–111)
CO2: 24 mmol/L (ref 22–32)
Calcium: 9.1 mg/dL (ref 8.9–10.3)
Creatinine, Ser: 1.06 mg/dL (ref 0.61–1.24)
GFR calc Af Amer: >60 mL/min (ref >60)
GFR calc non Af Amer: >60 mL/min (ref >60)
Glucose, Bld: 123 mg/dL — ABNORMAL HIGH (ref 70–99)
POTASSIUM: 3.9 mmol/L (ref 3.5–5.1)
SODIUM: 137 mmol/L (ref 135–145)
TOTAL PROTEIN: 8.3 g/dL — AB (ref 6.5–8.1)

## 2017-12-15 LAB — VITAMIN B12: Vitamin B-12: 334 pg/mL (ref 180–914)

## 2017-12-15 LAB — BRAIN NATRIURETIC PEPTIDE
B NATRIURETIC PEPTIDE 5: 8 pg/mL (ref 0.0–100.0)
B Natriuretic Peptide: 11 pg/mL (ref 0.0–100.0)

## 2017-12-15 LAB — FIBRIN DERIVATIVES D-DIMER (ARMC ONLY): Fibrin derivatives D-dimer (ARMC): 550.02 ng/mL (FEU) — ABNORMAL HIGH (ref 0.00–499.00)

## 2017-12-15 LAB — TROPONIN I: Troponin I: <0.03 ng/mL (ref <0.03)

## 2017-12-15 MED ORDER — SODIUM CHLORIDE 0.9 % IV BOLUS
1000.0000 mL | Freq: Once | INTRAVENOUS | Status: AC
  Administered 2017-12-15: 1000 mL via INTRAVENOUS

## 2017-12-15 MED ORDER — IOHEXOL 350 MG/ML SOLN
75.0000 mL | Freq: Once | INTRAVENOUS | Status: AC | PRN
  Administered 2017-12-15: 75 mL via INTRAVENOUS

## 2017-12-15 NOTE — ED Triage Notes (Signed)
Pt to ED via EMS w/ c/o CP since last night. Pt had recent stents placed x1wk ago. PT was seen in PCP yesterday and was called today to be eval for possible blood clot due to blood work results. PT took 1 nitro PTA, NAD noted, VSS

## 2017-12-15 NOTE — ED Notes (Signed)
ED Provider at bedside. 
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
## 2017-12-15 NOTE — Assessment & Plan Note (Signed)
Now on high dose statin plus zetia; goal LDL less than 70, hoping for plaque regression

## 2017-12-15 NOTE — Patient Instructions (Signed)
Please have labs done at the hospital now [facility] have you see the pulmonologist ASAP  I will contact you at the number(s) you gave me  Patient can be reached at: [phone] Wife: [phone] [name])
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
## 2017-12-16 ENCOUNTER — Encounter: Payer: Self-pay | Admitting: *Deleted

## 2017-12-16 ENCOUNTER — Telehealth: Payer: Self-pay

## 2017-12-16 ENCOUNTER — Encounter: Payer: Self-pay | Admitting: Internal Medicine

## 2017-12-16 ENCOUNTER — Encounter: Payer: BLUE CROSS/BLUE SHIELD | Attending: Family Medicine | Admitting: *Deleted

## 2017-12-16 ENCOUNTER — Ambulatory Visit (INDEPENDENT_AMBULATORY_CARE_PROVIDER_SITE_OTHER): Payer: BLUE CROSS/BLUE SHIELD | Admitting: Internal Medicine

## 2017-12-16 ENCOUNTER — Encounter: Payer: Self-pay | Admitting: Family Medicine

## 2017-12-16 ENCOUNTER — Telehealth: Payer: Self-pay | Admitting: Family Medicine

## 2017-12-16 VITALS — Ht 72.0 in | Wt 235.2 lb

## 2017-12-16 VITALS — BP 136/90 | HR 74 | Resp 16 | Ht 71.0 in | Wt 236.0 lb

## 2017-12-16 DIAGNOSIS — I25110 Atherosclerotic heart disease of native coronary artery with unstable angina pectoris: Secondary | ICD-10-CM | POA: Diagnosis not present

## 2017-12-16 DIAGNOSIS — I252 Old myocardial infarction: Secondary | ICD-10-CM | POA: Insufficient documentation

## 2017-12-16 DIAGNOSIS — G4719 Other hypersomnia: Secondary | ICD-10-CM | POA: Diagnosis not present

## 2017-12-16 DIAGNOSIS — Z79899 Other long term (current) drug therapy: Secondary | ICD-10-CM | POA: Insufficient documentation

## 2017-12-16 DIAGNOSIS — I272 Pulmonary hypertension, unspecified: Secondary | ICD-10-CM

## 2017-12-16 DIAGNOSIS — E785 Hyperlipidemia, unspecified: Secondary | ICD-10-CM | POA: Diagnosis not present

## 2017-12-16 DIAGNOSIS — K219 Gastro-esophageal reflux disease without esophagitis: Secondary | ICD-10-CM | POA: Insufficient documentation

## 2017-12-16 DIAGNOSIS — Z7982 Long term (current) use of aspirin: Secondary | ICD-10-CM | POA: Insufficient documentation

## 2017-12-16 DIAGNOSIS — E538 Deficiency of other specified B group vitamins: Secondary | ICD-10-CM | POA: Insufficient documentation

## 2017-12-16 DIAGNOSIS — I509 Heart failure, unspecified: Secondary | ICD-10-CM | POA: Insufficient documentation

## 2017-12-16 DIAGNOSIS — G709 Myoneural disorder, unspecified: Secondary | ICD-10-CM | POA: Insufficient documentation

## 2017-12-16 DIAGNOSIS — I110 Hypertensive heart disease with heart failure: Secondary | ICD-10-CM | POA: Diagnosis not present

## 2017-12-16 DIAGNOSIS — Z87891 Personal history of nicotine dependence: Secondary | ICD-10-CM | POA: Insufficient documentation

## 2017-12-16 DIAGNOSIS — I213 ST elevation (STEMI) myocardial infarction of unspecified site: Secondary | ICD-10-CM

## 2017-12-16 DIAGNOSIS — Z955 Presence of coronary angioplasty implant and graft: Secondary | ICD-10-CM | POA: Diagnosis not present

## 2017-12-16 DIAGNOSIS — I200 Unstable angina: Secondary | ICD-10-CM | POA: Diagnosis not present

## 2017-12-16 HISTORY — DX: Pulmonary hypertension, unspecified: I27.20

## 2017-12-16 NOTE — Patient Instructions (Signed)
Patient Instructions  Patient Details  Name: Dale Kennedy MRN: [id number] Date of Birth: [birth year] Referring Provider:  Marjean Donna, MD  Below are your personal goals for exercise, nutrition, and risk factors. Our goal is to help you stay on track towards obtaining and maintaining these goals. We will be discussing your progress on these goals with you throughout the program.  Initial Exercise Prescription: Initial Exercise Prescription - 12/16/17 1400      Date of Initial Exercise RX and Referring Provider   Date  12/16/17    Referring Provider  [name]      Treadmill   MPH  1.4    Grade  1    Minutes  15    METs  2.26      Recumbant Bike   Level  4    RPM  60    Watts  48    Minutes  15    METs  3.4      NuStep   Level  3    SPM  80    Minutes  15    METs  3.4      Prescription Details   Frequency (times per week)  3    Duration  Progress to 45 minutes of aerobic exercise without signs/symptoms of physical distress      Intensity   THRR 40-80% of Max Heartrate  120-159    Ratings of Perceived Exertion  11-13    Perceived Dyspnea  0-4      Resistance Training   Training Prescription  Yes    Weight  4 lb    Reps  10-15       Exercise Goals: Frequency: Be able to perform aerobic exercise two to three times per week in program working toward 2-5 days per week of home exercise.  Intensity: Work with a perceived exertion of 11 (fairly light) - 15 (hard) while following your exercise prescription.  We will make changes to your prescription with you as you progress through the program.   Duration: Be able to do 30 to 45 minutes of continuous aerobic exercise in addition to a 5 minute warm-up and a 5 minute cool-down routine.   Nutrition Goals: Your personal nutrition goals will be established when you do your nutrition analysis with the dietician.  The following are general nutrition guidelines to follow: Cholesterol < 200mg /day Sodium < 1500mg /day Fiber: Men  under 50 yrs - 38 grams per day  Personal Goals: Personal Goals and Risk Factors at Admission - 12/16/17 1441      Core Components/Risk Factors/Patient Goals on Admission    Weight Management  Yes;Obesity    Intervention  Weight Management: Develop a combined nutrition and exercise program designed to reach desired caloric intake, while maintaining appropriate intake of nutrient and fiber, sodium and fats, and appropriate energy expenditure required for the weight goal.;Weight Management: Provide education and appropriate resources to help participant work on and attain dietary goals.;Weight Management/Obesity: Establish reasonable short term and long term weight goals.;Obesity: Provide education and appropriate resources to help participant work on and attain dietary goals.    Admit Weight  235 lb 3.2 oz (106.7 kg)    Goal Weight: Short Term  225 lb (102.1 kg)    Goal Weight: Long Term  200 lb (90.7 kg)    Expected Outcomes  Short Term: Continue to assess and modify interventions until short term weight is achieved;Long Term: Adherence to nutrition and physical activity/exercise program aimed toward attainment  of established weight goal;Weight Maintenance: Understanding of the daily nutrition guidelines, which includes 25-35% calories from fat, 7% or less cal from saturated fats, less than 200mg  cholesterol, less than 1.5gm of sodium, & 5 or more servings of fruits and vegetables daily;Weight Loss: Understanding of general recommendations for a balanced deficit meal plan, which promotes 1-2 lb weight loss per week and includes a negative energy balance of [phone] kcal/d;Understanding recommendations for meals to include 15-35% energy as protein, 25-35% energy from fat, 35-60% energy from carbohydrates, less than 200mg  of dietary cholesterol, 20-35 gm of total fiber daily;Understanding of distribution of calorie intake throughout the day with the consumption of 4-5 meals/snacks;Weight Gain: Understanding  of general recommendations for a high calorie, high protein meal plan that promotes weight gain by distributing calorie intake throughout the day with the consumption for 4-5 meals, snacks, and/or supplements    Improve shortness of breath with ADL's  Yes    Intervention  Provide education, individualized exercise plan and daily activity instruction to help decrease symptoms of SOB with activities of daily living.    Expected Outcomes  Short Term: Improve cardiorespiratory fitness to achieve a reduction of symptoms when performing ADLs;Long Term: Be able to perform more ADLs without symptoms or delay the onset of symptoms    Hypertension  Yes    Intervention  Provide education on lifestyle modifcations including regular physical activity/exercise, weight management, moderate sodium restriction and increased consumption of fresh fruit, vegetables, and low fat dairy, alcohol moderation, and smoking cessation.;Monitor prescription use compliance.    Expected Outcomes  Short Term: Continued assessment and intervention until BP is < 140/5mm HG in hypertensive participants. < 130/68mm HG in hypertensive participants with diabetes, heart failure or chronic kidney disease.;Long Term: Maintenance of blood pressure at goal levels.    Lipids  Yes    Intervention  Provide education and support for participant on nutrition & aerobic/resistive exercise along with prescribed medications to achieve LDL 70mg , HDL >40mg .    Expected Outcomes  Short Term: Participant states understanding of desired cholesterol values and is compliant with medications prescribed. Participant is following exercise prescription and nutrition guidelines.;Long Term: Cholesterol controlled with medications as prescribed, with individualized exercise RX and with personalized nutrition plan. Value goals: LDL < 70mg , HDL > 40 mg.    Stress  Yes    Intervention  Offer individual and/or small group education and counseling on adjustment to heart  disease, stress management and health-related lifestyle change. Teach and support self-help strategies.;Refer participants experiencing significant psychosocial distress to appropriate mental health specialists for further evaluation and treatment. When possible, include family members and significant others in education/counseling sessions.    Expected Outcomes  Short Term: Participant demonstrates changes in health-related behavior, relaxation and other stress management skills, ability to obtain effective social support, and compliance with psychotropic medications if prescribed.;Long Term: Emotional wellbeing is indicated by absence of clinically significant psychosocial distress or social isolation.       Tobacco Use Initial Evaluation: Social History   Tobacco Use  Smoking Status Former Smoker  . Packs/day: 1.00  . Types: Cigarettes  . Last attempt to quit: 04/06/2008  . Years since quitting: 9.7  Smokeless Tobacco Never Used    Exercise Goals and Review: Exercise Goals    Row Name 12/16/17 1430             Exercise Goals   Increase Physical Activity  Yes       Intervention  Provide advice, education, support and counseling  about physical activity/exercise needs.;Develop an individualized exercise prescription for aerobic and resistive training based on initial evaluation findings, risk stratification, comorbidities and participant's personal goals.       Expected Outcomes  Short Term: Attend rehab on a regular basis to increase amount of physical activity.;Long Term: Add in home exercise to make exercise part of routine and to increase amount of physical activity.;Long Term: Exercising regularly at least 3-5 days a week.       Increase Strength and Stamina  Yes       Intervention  Provide advice, education, support and counseling about physical activity/exercise needs.;Develop an individualized exercise prescription for aerobic and resistive training based on initial evaluation  findings, risk stratification, comorbidities and participant's personal goals.       Expected Outcomes  Short Term: Increase workloads from initial exercise prescription for resistance, speed, and METs.;Short Term: Perform resistance training exercises routinely during rehab and add in resistance training at home;Long Term: Improve cardiorespiratory fitness, muscular endurance and strength as measured by increased METs and functional capacity (6MWT)       Able to understand and use rate of perceived exertion (RPE) scale  Yes       Intervention  Provide education and explanation on how to use RPE scale       Expected Outcomes  Short Term: Able to use RPE daily in rehab to express subjective intensity level;Long Term:  Able to use RPE to guide intensity level when exercising independently       Able to understand and use Dyspnea scale  Yes       Intervention  Provide education and explanation on how to use Dyspnea scale       Expected Outcomes  Short Term: Able to use Dyspnea scale daily in rehab to express subjective sense of shortness of breath during exertion;Long Term: Able to use Dyspnea scale to guide intensity level when exercising independently       Knowledge and understanding of Target Heart Rate Range (THRR)  Yes       Intervention  Provide education and explanation of THRR including how the numbers were predicted and where they are located for reference       Expected Outcomes  Short Term: Able to state/look up THRR;Short Term: Able to use daily as guideline for intensity in rehab       Able to check pulse independently  Yes       Intervention  Provide education and demonstration on how to check pulse in carotid and radial arteries.;Review the importance of being able to check your own pulse for safety during independent exercise       Expected Outcomes  Short Term: Able to explain why pulse checking is important during independent exercise;Long Term: Able to check pulse independently and  accurately       Understanding of Exercise Prescription  Yes       Intervention  Provide education, explanation, and written materials on patient's individual exercise prescription       Expected Outcomes  Short Term: Able to explain program exercise prescription;Long Term: Able to explain home exercise prescription to exercise independently          Copy of goals given to participant.

## 2017-12-16 NOTE — Progress Notes (Signed)
Daily Session Note  Patient Details  Name: JACKIE LITTLEJOHN MRN: [id number] Date of Birth: 09/13/1975 Referring Provider:     Cardiac Rehab from 12/16/2017 in [facility]  Referring Provider  [name]      Encounter Date: 12/16/2017  Check In: Session Check In - 12/16/17 1422      Check-In   Supervising physician immediately available to respond to emergencies  See telemetry face sheet for immediately available ER MD    Location  [facility]-Cardiac & Pulmonary [facility]hab    Staff Present  Nada Maclachlan, BA, ACSM CEP, Exercise Physiologist;Krista Frederico Hamman, RN BSN;Meredith Sherryll Burger, RN BSN    Medication changes reported      No    Fall or balance concerns reported     No    Tobacco Cessation  No Change    Warm-up and Cool-down  Performed as group-led instruction    Resistance Training Performed  Yes    VAD Patient?  No    PAD/SET Patient?  No      Pain Assessment   Currently in Pain?  No/denies    Multiple Pain Sites  No        Exercise Prescription Changes - 12/16/17 1400      Response to Exercise   Blood Pressure (Admit)  136/82    Blood Pressure (Exercise)  156/88    Blood Pressure (Exit)  128/78    Heart Rate (Admit)  81 bpm    Heart Rate (Exercise)  93 bpm    Heart Rate (Exit)  76 bpm    Oxygen Saturation (Admit)  98 %    Oxygen Saturation (Exercise)  98 %    Oxygen Saturation (Exit)  98 %    Rating of Perceived Exertion (Exercise)  12    Perceived Dyspnea (Exercise)  2       Social History   Tobacco Use  Smoking Status Former Smoker  . Packs/day: 1.00  . Types: Cigarettes  . Last attempt to quit: 04/06/2008  . Years since quitting: 9.7  Smokeless Tobacco Never Used    Goals Met:  Exercise tolerated well Personal goals reviewed No report of cardiac concerns or symptoms Strength training completed today  Goals Unmet:  Not Applicable  Comments: Initial Med review and 72m   Dr. MEmily Filbertis Medical Director for [facility].

## 2017-12-16 NOTE — Addendum Note (Signed)
Addended by: Docia Furl on: 12/16/2017 10:42 AM   Modules accepted: Orders

## 2017-12-16 NOTE — Telephone Encounter (Signed)
He can use the flonase, but that's all Do not use Afrin

## 2017-12-16 NOTE — Telephone Encounter (Signed)
I reviewed the chest CT from yesterday's ER visit Patient has pulmonary hypertension Please contact patient's CARDIOLOGIST at [facility], find out who he is seeing and when; let them know to look at his chest CT done at [facility], be aware of his pulmonary hypertension so they can manage this Thank you

## 2017-12-16 NOTE — Progress Notes (Signed)
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 consumption of fresh fruit, vegetables, and low fat dairy, alcohol moderation, and smoking cessation.;Monitor prescription use compliance.    Expected Outcomes  Short Term: Continued assessment and intervention until BP is < 140/42m HG in hypertensive participants. < 130/848mHG in hypertensive participants with diabetes, heart failure or chronic kidney disease.;Long Term: Maintenance of blood pressure at goal levels.    Lipids  Yes    Intervention  Provide education and support for participant on nutrition & aerobic/resistive exercise along with prescribed medications to achieve LDL '70mg'$ , HDL >'40mg'$ .    Expected Outcomes  Short Term: Participant states understanding of desired cholesterol values and is compliant with medications prescribed. Participant is following exercise prescription and nutrition guidelines.;Long Term: Cholesterol controlled with medications as prescribed, with individualized exercise RX and with personalized nutrition plan. Value goals: LDL <  '70mg'$ , HDL > 40 mg.    Stress  Yes    Intervention  Offer individual and/or small group education and counseling on adjustment to heart disease, stress management and health-related lifestyle change. Teach and support self-help strategies.;Refer participants experiencing significant psychosocial distress to appropriate mental health specialists for further evaluation and treatment. When possible, include family members and significant others in education/counseling sessions.    Expected Outcomes  Short Term: Participant demonstrates changes in health-related behavior, relaxation and other stress management skills, ability to obtain effective social support, and compliance with psychotropic medications if prescribed.;Long Term: Emotional wellbeing is indicated by absence of clinically significant psychosocial distress or social isolation.       Core Components/Risk Factors/Patient Goals Review:    Core Components/Risk Factors/Patient Goals at Discharge (Final Review):    ITP Comments: ITP Comments    Row Name 12/16/17 1429           ITP Comments  Medical Review Completed; initial ITP created. [facility]          Comments: Initial ITP
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
## 2017-12-16 NOTE — Telephone Encounter (Signed)
Faxed info for Dr. Juanell Fairly to review

## 2017-12-16 NOTE — Telephone Encounter (Signed)
Left detailed VM.  

## 2017-12-16 NOTE — Telephone Encounter (Signed)
Wife states that the cardiologist is Dr Hoyle Barr at [facility]. Fax#: [phone]  Patient would like to know what kind of nasal spray can he use with his condition? States that she has a stuffy nose, but he is worried about any reactions with his coreg. CB#: [phone]

## 2017-12-16 NOTE — Patient Instructions (Signed)
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
## 2017-12-16 NOTE — Telephone Encounter (Signed)
Wife will call back with cardio's name

## 2017-12-18 ENCOUNTER — Encounter
Admit: 2017-12-18 | Discharge: 2017-12-18 | Disposition: A | Discharge status: Home / Self Care | Payer: BLUE CROSS/BLUE SHIELD

## 2017-12-18 DIAGNOSIS — R0602 Shortness of breath: Principal | ICD-10-CM

## 2017-12-18 DIAGNOSIS — I480 Paroxysmal atrial fibrillation: Secondary | ICD-10-CM | POA: Diagnosis not present

## 2017-12-18 DIAGNOSIS — R079 Chest pain, unspecified: Secondary | ICD-10-CM | POA: Diagnosis not present

## 2017-12-18 DIAGNOSIS — I10 Essential (primary) hypertension: Secondary | ICD-10-CM | POA: Diagnosis not present

## 2017-12-18 DIAGNOSIS — E785 Hyperlipidemia, unspecified: Secondary | ICD-10-CM | POA: Diagnosis not present

## 2017-12-18 DIAGNOSIS — R002 Palpitations: Secondary | ICD-10-CM | POA: Diagnosis not present

## 2017-12-18 DIAGNOSIS — Z79899 Other long term (current) drug therapy: Secondary | ICD-10-CM | POA: Diagnosis not present
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
## 2017-12-20 ENCOUNTER — Encounter: Payer: Self-pay | Admitting: Internal Medicine

## 2017-12-20 ENCOUNTER — Inpatient Hospital Stay: Payer: BLUE CROSS/BLUE SHIELD | Admitting: Family Medicine

## 2017-12-20 DIAGNOSIS — I509 Heart failure, unspecified: Secondary | ICD-10-CM | POA: Diagnosis not present

## 2017-12-20 DIAGNOSIS — I200 Unstable angina: Secondary | ICD-10-CM | POA: Diagnosis not present

## 2017-12-20 DIAGNOSIS — Z79899 Other long term (current) drug therapy: Secondary | ICD-10-CM | POA: Diagnosis not present

## 2017-12-20 DIAGNOSIS — G4719 Other hypersomnia: Secondary | ICD-10-CM

## 2017-12-20 DIAGNOSIS — I272 Pulmonary hypertension, unspecified: Secondary | ICD-10-CM | POA: Diagnosis not present

## 2017-12-20 DIAGNOSIS — G709 Myoneural disorder, unspecified: Secondary | ICD-10-CM | POA: Diagnosis not present

## 2017-12-20 DIAGNOSIS — Z955 Presence of coronary angioplasty implant and graft: Secondary | ICD-10-CM | POA: Diagnosis not present

## 2017-12-20 DIAGNOSIS — E785 Hyperlipidemia, unspecified: Secondary | ICD-10-CM | POA: Diagnosis not present

## 2017-12-20 DIAGNOSIS — I25110 Atherosclerotic heart disease of native coronary artery with unstable angina pectoris: Secondary | ICD-10-CM | POA: Diagnosis not present

## 2017-12-20 DIAGNOSIS — Z87891 Personal history of nicotine dependence: Secondary | ICD-10-CM | POA: Diagnosis not present

## 2017-12-20 DIAGNOSIS — I213 ST elevation (STEMI) myocardial infarction of unspecified site: Secondary | ICD-10-CM

## 2017-12-20 DIAGNOSIS — I252 Old myocardial infarction: Secondary | ICD-10-CM | POA: Diagnosis not present

## 2017-12-20 DIAGNOSIS — K219 Gastro-esophageal reflux disease without esophagitis: Secondary | ICD-10-CM | POA: Diagnosis not present

## 2017-12-20 DIAGNOSIS — I110 Hypertensive heart disease with heart failure: Secondary | ICD-10-CM | POA: Diagnosis not present

## 2017-12-20 DIAGNOSIS — Z7982 Long term (current) use of aspirin: Secondary | ICD-10-CM | POA: Diagnosis not present

## 2017-12-20 NOTE — Progress Notes (Signed)
Daily Session Note  Patient Details  Name: Dale Kennedy MRN: [id number] Date of Birth: 1975/04/22 Referring Provider:     Cardiac Rehab from 12/16/2017 in [facility] Cardiac and Pulmonary Rehab  Referring Provider  Dale Kennedy      Encounter Date: 12/20/2017  Check In: Session Check In - 12/20/17 1740      Check-In   Supervising physician immediately available to respond to emergencies  See telemetry face sheet for immediately available ER MD    Location  [facility]-Cardiac & Pulmonary Rehab    Staff Present  Nada Maclachlan, BA, ACSM CEP, Exercise Physiologist;Carroll Enterkin, RN, Moises Blood, BS, ACSM CEP, Exercise Physiologist    Medication changes reported      No    Fall or balance concerns reported     No    Warm-up and Cool-down  Performed on first and last piece of equipment    Resistance Training Performed  Yes    VAD Patient?  No    PAD/SET Patient?  No      Pain Assessment   Currently in Pain?  No/denies    Multiple Pain Sites  No          Social History   Tobacco Use  Smoking Status Former Smoker  . Packs/day: 1.00  . Types: Cigarettes  . Last attempt to quit: 04/06/2008  . Years since quitting: 9.7  Smokeless Tobacco Never Used    Goals Met:  Independence with exercise equipment Exercise tolerated well No report of cardiac concerns or symptoms Strength training completed today  Goals Unmet:  Not Applicable  Comments: First full day of exercise!  Patient was oriented to gym and equipment including functions, settings, policies, and procedures.  Patient's individual exercise prescription and treatment plan were reviewed.  All starting workloads were established based on the results of the 6 minute walk test done at initial orientation visit.  The plan for exercise progression was also introduced and progression will be customized based on patient's performance and goals.    Dr. Emily Kennedy is Medical Director for [place] and [facility]  Pulmonary Rehabilitation.

## 2017-12-21 ENCOUNTER — Telehealth: Payer: Self-pay | Admitting: Internal Medicine

## 2017-12-21 DIAGNOSIS — Z9989 Dependence on other enabling machines and devices: Secondary | ICD-10-CM

## 2017-12-21 DIAGNOSIS — G4733 Obstructive sleep apnea (adult) (pediatric): Secondary | ICD-10-CM

## 2017-12-21 NOTE — Telephone Encounter (Signed)
Patient calling to check on status of results °

## 2017-12-22 ENCOUNTER — Encounter: Payer: BLUE CROSS/BLUE SHIELD | Admitting: *Deleted

## 2017-12-22 DIAGNOSIS — I509 Heart failure, unspecified: Secondary | ICD-10-CM | POA: Diagnosis not present

## 2017-12-22 DIAGNOSIS — I272 Pulmonary hypertension, unspecified: Secondary | ICD-10-CM | POA: Diagnosis not present

## 2017-12-22 DIAGNOSIS — I110 Hypertensive heart disease with heart failure: Secondary | ICD-10-CM | POA: Diagnosis not present

## 2017-12-22 DIAGNOSIS — I213 ST elevation (STEMI) myocardial infarction of unspecified site: Secondary | ICD-10-CM

## 2017-12-22 DIAGNOSIS — I25110 Atherosclerotic heart disease of native coronary artery with unstable angina pectoris: Secondary | ICD-10-CM | POA: Diagnosis not present

## 2017-12-22 DIAGNOSIS — G709 Myoneural disorder, unspecified: Secondary | ICD-10-CM | POA: Diagnosis not present

## 2017-12-22 DIAGNOSIS — I200 Unstable angina: Secondary | ICD-10-CM | POA: Diagnosis not present

## 2017-12-22 DIAGNOSIS — K219 Gastro-esophageal reflux disease without esophagitis: Secondary | ICD-10-CM | POA: Diagnosis not present

## 2017-12-22 DIAGNOSIS — Z87891 Personal history of nicotine dependence: Secondary | ICD-10-CM | POA: Diagnosis not present

## 2017-12-22 DIAGNOSIS — I252 Old myocardial infarction: Secondary | ICD-10-CM | POA: Diagnosis not present

## 2017-12-22 DIAGNOSIS — Z79899 Other long term (current) drug therapy: Secondary | ICD-10-CM | POA: Diagnosis not present

## 2017-12-22 DIAGNOSIS — Z955 Presence of coronary angioplasty implant and graft: Secondary | ICD-10-CM | POA: Diagnosis not present

## 2017-12-22 DIAGNOSIS — Z7982 Long term (current) use of aspirin: Secondary | ICD-10-CM | POA: Diagnosis not present

## 2017-12-22 DIAGNOSIS — E785 Hyperlipidemia, unspecified: Secondary | ICD-10-CM | POA: Diagnosis not present

## 2017-12-22 NOTE — Telephone Encounter (Signed)
Pt aware of HST results Orders placed. Orders placed for ONO

## 2017-12-22 NOTE — Progress Notes (Signed)
Daily Session Note  Patient Details  Name: Dale Kennedy MRN: [id number] Date of Birth: 06-09-1975 Referring Provider:     Cardiac Rehab from 12/16/2017 in [facility] Cardiac and Pulmonary Rehab  Referring Provider  [name]      Encounter Date: 12/22/2017  Check In: Session Check In - 12/22/17 [facility]      Check-In   Supervising physician immediately available to respond to emergencies  See telemetry face sheet for immediately available ER MD    Location  [facility]-Cardiac & Pulmonary Rehab    Staff Present  Renita Papa, RN Vickki Hearing, BA, ACSM CEP, Exercise Physiologist;Carroll Enterkin, RN, BSN    Medication changes reported      No    Fall or balance concerns reported     No    Warm-up and Cool-down  Performed on first and last piece of equipment    Resistance Training Performed  Yes    VAD Patient?  No    PAD/SET Patient?  No      Pain Assessment   Currently in Pain?  No/denies          Social History   Tobacco Use  Smoking Status Former Smoker  . Packs/day: 1.00  . Types: Cigarettes  . Last attempt to quit: 04/06/2008  . Years since quitting: 9.7  Smokeless Tobacco Never Used    Goals Met:  Independence with exercise equipment Exercise tolerated well No report of cardiac concerns or symptoms Strength training completed today  Goals Unmet:  Not Applicable  Comments: Pt able to follow exercise prescription today without complaint.  Will continue to monitor for progression.    Dr. Emily Filbert is Medical Director for [place] and [facility] Pulmonary Rehabilitation.

## 2017-12-23 ENCOUNTER — Encounter: Payer: BLUE CROSS/BLUE SHIELD | Admitting: *Deleted

## 2017-12-23 DIAGNOSIS — K219 Gastro-esophageal reflux disease without esophagitis: Secondary | ICD-10-CM | POA: Diagnosis not present

## 2017-12-23 DIAGNOSIS — Z79899 Other long term (current) drug therapy: Secondary | ICD-10-CM | POA: Diagnosis not present

## 2017-12-23 DIAGNOSIS — E785 Hyperlipidemia, unspecified: Secondary | ICD-10-CM | POA: Diagnosis not present

## 2017-12-23 DIAGNOSIS — Z955 Presence of coronary angioplasty implant and graft: Secondary | ICD-10-CM | POA: Diagnosis not present

## 2017-12-23 DIAGNOSIS — I272 Pulmonary hypertension, unspecified: Secondary | ICD-10-CM | POA: Diagnosis not present

## 2017-12-23 DIAGNOSIS — G709 Myoneural disorder, unspecified: Secondary | ICD-10-CM | POA: Diagnosis not present

## 2017-12-23 DIAGNOSIS — I213 ST elevation (STEMI) myocardial infarction of unspecified site: Secondary | ICD-10-CM

## 2017-12-23 DIAGNOSIS — I200 Unstable angina: Secondary | ICD-10-CM | POA: Diagnosis not present

## 2017-12-23 DIAGNOSIS — I110 Hypertensive heart disease with heart failure: Secondary | ICD-10-CM | POA: Diagnosis not present

## 2017-12-23 DIAGNOSIS — I509 Heart failure, unspecified: Secondary | ICD-10-CM | POA: Diagnosis not present

## 2017-12-23 DIAGNOSIS — I25110 Atherosclerotic heart disease of native coronary artery with unstable angina pectoris: Secondary | ICD-10-CM | POA: Diagnosis not present

## 2017-12-23 DIAGNOSIS — Z87891 Personal history of nicotine dependence: Secondary | ICD-10-CM | POA: Diagnosis not present

## 2017-12-23 DIAGNOSIS — I252 Old myocardial infarction: Secondary | ICD-10-CM | POA: Diagnosis not present

## 2017-12-23 DIAGNOSIS — Z7982 Long term (current) use of aspirin: Secondary | ICD-10-CM | POA: Diagnosis not present

## 2017-12-23 NOTE — Progress Notes (Signed)
Daily Session Note  Patient Details  Name: Dale Kennedy MRN: [id number] Date of Birth: Apr 24, 1975 Referring Provider:     Cardiac Rehab from 12/16/2017 in [facility] Cardiac and Pulmonary Rehab  Referring Provider  [name]      Encounter Date: 12/23/2017  Check In: Session Check In - 12/23/17 1628      Check-In   Supervising physician immediately available to respond to emergencies  See telemetry face sheet for immediately available ER MD    Location  [facility]-Cardiac & Pulmonary Rehab    Staff Present  Earlean Shawl, BS, ACSM CEP, Exercise Physiologist;Meredith Sherryll Burger, RN Vickki Hearing, BA, ACSM CEP, Exercise Physiologist    Medication changes reported      No    Fall or balance concerns reported     No    Tobacco Cessation  No Change    Warm-up and Cool-down  Performed on first and last piece of equipment    Resistance Training Performed  Yes    VAD Patient?  No    PAD/SET Patient?  No      Pain Assessment   Currently in Pain?  No/denies    Multiple Pain Sites  No          Social History   Tobacco Use  Smoking Status Former Smoker  . Packs/day: 1.00  . Types: Cigarettes  . Last attempt to quit: 04/06/2008  . Years since quitting: 9.7  Smokeless Tobacco Never Used    Goals Met:  Independence with exercise equipment Exercise tolerated well No report of cardiac concerns or symptoms Strength training completed today  Goals Unmet:  Not Applicable  Comments: Pt able to follow exercise prescription today without complaint.  Will continue to monitor for progression.    Dr. Emily Filbert is Medical Director for [place] and [facility] Pulmonary [facility].

## 2017-12-24 ENCOUNTER — Inpatient Hospital Stay: Payer: BLUE CROSS/BLUE SHIELD

## 2017-12-24 ENCOUNTER — Inpatient Hospital Stay: Payer: BLUE CROSS/BLUE SHIELD | Attending: Oncology | Admitting: Oncology

## 2017-12-24 ENCOUNTER — Encounter: Payer: Self-pay | Admitting: Oncology

## 2017-12-24 VITALS — BP 113/72 | HR 79 | Temp 97.8°F | Resp 20 | Wt 239.2 lb

## 2017-12-24 DIAGNOSIS — Z955 Presence of coronary angioplasty implant and graft: Secondary | ICD-10-CM

## 2017-12-24 DIAGNOSIS — I252 Old myocardial infarction: Secondary | ICD-10-CM | POA: Diagnosis not present

## 2017-12-24 DIAGNOSIS — I219 Acute myocardial infarction, unspecified: Secondary | ICD-10-CM

## 2017-12-24 DIAGNOSIS — R718 Other abnormality of red blood cells: Secondary | ICD-10-CM | POA: Diagnosis not present

## 2017-12-24 DIAGNOSIS — R778 Other specified abnormalities of plasma proteins: Secondary | ICD-10-CM | POA: Diagnosis not present

## 2017-12-24 LAB — BASIC METABOLIC PANEL
ANION GAP: 8 (ref 5–15)
BUN: 11 mg/dL (ref 6–20)
CHLORIDE: 106 mmol/L (ref 98–111)
CO2: 25 mmol/L (ref 22–32)
Calcium: 8.7 mg/dL — ABNORMAL LOW (ref 8.9–10.3)
Creatinine, Ser: 1.02 mg/dL (ref 0.61–1.24)
GFR calc non Af Amer: >60 mL/min (ref >60)
Glucose, Bld: 101 mg/dL — ABNORMAL HIGH (ref 70–99)
Potassium: 4.3 mmol/L (ref 3.5–5.1)
SODIUM: 139 mmol/L (ref 135–145)

## 2017-12-24 LAB — CBC WITH DIFFERENTIAL/PLATELET
BASOS PCT: 1 %
Basophils Absolute: 0.1 10*3/uL (ref 0–0.1)
Eosinophils Absolute: 0.1 10*3/uL (ref 0–0.7)
Eosinophils Relative: 2 %
HEMATOCRIT: 40.2 % (ref 40.0–52.0)
HEMOGLOBIN: 12.7 g/dL — AB (ref 13.0–18.0)
LYMPHS ABS: 1.4 10*3/uL (ref 1.0–3.6)
Lymphocytes Relative: 24 %
MCH: 23.4 pg — AB (ref 26.0–34.0)
MCHC: 31.7 g/dL — AB (ref 32.0–36.0)
MCV: 74 fL — ABNORMAL LOW (ref 80.0–100.0)
MONOS PCT: 12 %
Monocytes Absolute: 0.7 10*3/uL (ref 0.2–1.0)
NEUTROS ABS: 3.7 10*3/uL (ref 1.4–6.5)
NEUTROS PCT: 61 %
Platelets: 372 10*3/uL (ref 150–440)
RBC: 5.43 MIL/uL (ref 4.40–5.90)
RDW: 18 % — ABNORMAL HIGH (ref 11.5–14.5)
WBC: 6 10*3/uL (ref 3.8–10.6)

## 2017-12-24 NOTE — Progress Notes (Signed)
Patient denies any concerns today.  

## 2017-12-25 LAB — MISC LABCORP TEST (SEND OUT): Labcorp test code: 16873

## 2017-12-25 LAB — IGG, IGA, IGM
IGA: 321 mg/dL (ref 90–386)
IgG (Immunoglobin G), Serum: 1823 mg/dL — ABNORMAL HIGH (ref 700–1600)
IgM (Immunoglobulin M), Srm: 203 mg/dL — ABNORMAL HIGH (ref 20–172)

## 2017-12-25 LAB — CARDIOLIPIN ANTIBODIES, IGG, IGM, IGA
Anticardiolipin IgA: <9 APL U/mL (ref 0–11)
Anticardiolipin IgM: <9 MPL U/mL (ref 0–12)

## 2017-12-25 LAB — BETA-2-GLYCOPROTEIN I ABS, IGG/M/A

## 2017-12-25 LAB — ANTITHROMBIN III: AntiThromb III Func: 102 % (ref 75–120)

## 2017-12-26 LAB — PROTEIN C ACTIVITY: PROTEIN C ACTIVITY: 112 % (ref 73–180)

## 2017-12-26 LAB — PROTEIN S, TOTAL: PROTEIN S AG TOTAL: 94 % (ref 60–150)

## 2017-12-26 LAB — PROTEIN S ACTIVITY: PROTEIN S ACTIVITY: 98 % (ref 63–140)

## 2017-12-26 LAB — LUPUS ANTICOAGULANT PANEL
DRVVT: 44.6 s (ref 0.0–47.0)
PTT LA: 50 s (ref 0.0–51.9)

## 2017-12-26 LAB — PROTEIN C, TOTAL: Protein C, Total: 91 % (ref 60–150)

## 2017-12-27 ENCOUNTER — Encounter: Admit: 2017-12-27 | Discharge: 2017-12-28 | Payer: BLUE CROSS/BLUE SHIELD | Attending: Clinical | Primary: Clinical

## 2017-12-27 DIAGNOSIS — I2510 Atherosclerotic heart disease of native coronary artery without angina pectoris: Secondary | ICD-10-CM

## 2017-12-27 DIAGNOSIS — F4323 Adjustment disorder with mixed anxiety and depressed mood: Principal | ICD-10-CM

## 2017-12-27 DIAGNOSIS — I213 ST elevation (STEMI) myocardial infarction of unspecified site: Secondary | ICD-10-CM

## 2017-12-27 DIAGNOSIS — E785 Hyperlipidemia, unspecified: Secondary | ICD-10-CM | POA: Diagnosis not present

## 2017-12-27 DIAGNOSIS — I509 Heart failure, unspecified: Secondary | ICD-10-CM | POA: Diagnosis not present

## 2017-12-27 DIAGNOSIS — I272 Pulmonary hypertension, unspecified: Secondary | ICD-10-CM | POA: Diagnosis not present

## 2017-12-27 DIAGNOSIS — Z955 Presence of coronary angioplasty implant and graft: Secondary | ICD-10-CM | POA: Diagnosis not present

## 2017-12-27 DIAGNOSIS — I25110 Atherosclerotic heart disease of native coronary artery with unstable angina pectoris: Secondary | ICD-10-CM | POA: Diagnosis not present

## 2017-12-27 DIAGNOSIS — Z87891 Personal history of nicotine dependence: Secondary | ICD-10-CM | POA: Diagnosis not present

## 2017-12-27 DIAGNOSIS — G709 Myoneural disorder, unspecified: Secondary | ICD-10-CM | POA: Diagnosis not present

## 2017-12-27 DIAGNOSIS — K219 Gastro-esophageal reflux disease without esophagitis: Secondary | ICD-10-CM | POA: Diagnosis not present

## 2017-12-27 DIAGNOSIS — I252 Old myocardial infarction: Secondary | ICD-10-CM | POA: Diagnosis not present

## 2017-12-27 DIAGNOSIS — Z7982 Long term (current) use of aspirin: Secondary | ICD-10-CM | POA: Diagnosis not present

## 2017-12-27 DIAGNOSIS — I200 Unstable angina: Secondary | ICD-10-CM | POA: Diagnosis not present

## 2017-12-27 DIAGNOSIS — Z79899 Other long term (current) drug therapy: Secondary | ICD-10-CM | POA: Diagnosis not present

## 2017-12-27 DIAGNOSIS — I110 Hypertensive heart disease with heart failure: Secondary | ICD-10-CM | POA: Diagnosis not present

## 2017-12-27 LAB — PROTEIN ELECTROPHORESIS, SERUM
A/G RATIO SPE: 0.9 (ref 0.7–1.7)
ALBUMIN ELP: 3.6 g/dL (ref 2.9–4.4)
Alpha-1-Globulin: 0.3 g/dL (ref 0.0–0.4)
Alpha-2-Globulin: 0.8 g/dL (ref 0.4–1.0)
BETA GLOBULIN: 1.1 g/dL (ref 0.7–1.3)
GLOBULIN, TOTAL: 4.1 g/dL — AB (ref 2.2–3.9)
Gamma Globulin: 2 g/dL — ABNORMAL HIGH (ref 0.4–1.8)
TOTAL PROTEIN ELP: 7.7 g/dL (ref 6.0–8.5)

## 2017-12-27 LAB — KAPPA/LAMBDA LIGHT CHAINS
KAPPA, LAMDA LIGHT CHAIN RATIO: 2.4 — AB (ref 0.26–1.65)
Kappa free light chain: 58.5 mg/L — ABNORMAL HIGH (ref 3.3–19.4)
LAMDA FREE LIGHT CHAINS: 24.4 mg/L (ref 5.7–26.3)

## 2017-12-27 NOTE — Progress Notes (Signed)
Daily Session Note  Patient Details  Name: Dale Kennedy MRN: [id number] Date of Birth: [birth year] Referring Provider:     Cardiac Rehab from 12/16/2017 in [facility] Cardiac and Pulmonary Rehab  Referring Provider  [name]      Encounter Date: 12/27/2017  Check In:      Social History   Tobacco Use  Smoking Status Former Smoker  . Packs/day: 1.00  . Types: Cigarettes  . Last attempt to quit: 04/06/2008  . Years since quitting: 9.7  Smokeless Tobacco Never Used    Goals Met:  Independence with exercise equipment Exercise tolerated well No report of cardiac concerns or symptoms Strength training completed today  Goals Unmet:  Not Applicable  Comments: Pt able to follow exercise prescription today without complaint.  Will continue to monitor for progression.    Dr. Emily Filbert is Medical Director for [place] and [facility] Pulmonary Rehabilitation.

## 2017-12-29 ENCOUNTER — Encounter: Payer: BLUE CROSS/BLUE SHIELD | Admitting: *Deleted

## 2017-12-29 ENCOUNTER — Ambulatory Visit: Admit: 2017-12-29 | Discharge: 2017-12-29 | Payer: BLUE CROSS/BLUE SHIELD

## 2017-12-29 DIAGNOSIS — G709 Myoneural disorder, unspecified: Secondary | ICD-10-CM | POA: Diagnosis not present

## 2017-12-29 DIAGNOSIS — Z87891 Personal history of nicotine dependence: Secondary | ICD-10-CM | POA: Diagnosis not present

## 2017-12-29 DIAGNOSIS — I480 Paroxysmal atrial fibrillation: Secondary | ICD-10-CM | POA: Diagnosis not present

## 2017-12-29 DIAGNOSIS — Z539 Procedure and treatment not carried out, unspecified reason: Secondary | ICD-10-CM | POA: Diagnosis not present

## 2017-12-29 DIAGNOSIS — I200 Unstable angina: Secondary | ICD-10-CM | POA: Diagnosis not present

## 2017-12-29 DIAGNOSIS — E785 Hyperlipidemia, unspecified: Secondary | ICD-10-CM | POA: Diagnosis not present

## 2017-12-29 DIAGNOSIS — I110 Hypertensive heart disease with heart failure: Secondary | ICD-10-CM | POA: Diagnosis not present

## 2017-12-29 DIAGNOSIS — Z955 Presence of coronary angioplasty implant and graft: Secondary | ICD-10-CM

## 2017-12-29 DIAGNOSIS — I2510 Atherosclerotic heart disease of native coronary artery without angina pectoris: Secondary | ICD-10-CM | POA: Diagnosis not present

## 2017-12-29 DIAGNOSIS — K219 Gastro-esophageal reflux disease without esophagitis: Secondary | ICD-10-CM | POA: Diagnosis not present

## 2017-12-29 DIAGNOSIS — I25110 Atherosclerotic heart disease of native coronary artery with unstable angina pectoris: Secondary | ICD-10-CM | POA: Diagnosis not present

## 2017-12-29 DIAGNOSIS — Z7982 Long term (current) use of aspirin: Secondary | ICD-10-CM | POA: Diagnosis not present

## 2017-12-29 DIAGNOSIS — I2582 Chronic total occlusion of coronary artery: Secondary | ICD-10-CM | POA: Diagnosis not present

## 2017-12-29 DIAGNOSIS — I272 Pulmonary hypertension, unspecified: Secondary | ICD-10-CM | POA: Diagnosis not present

## 2017-12-29 DIAGNOSIS — I509 Heart failure, unspecified: Secondary | ICD-10-CM | POA: Diagnosis not present

## 2017-12-29 DIAGNOSIS — L7632 Postprocedural hematoma of skin and subcutaneous tissue following other procedure: Secondary | ICD-10-CM | POA: Diagnosis not present

## 2017-12-29 DIAGNOSIS — I252 Old myocardial infarction: Secondary | ICD-10-CM | POA: Diagnosis not present

## 2017-12-29 DIAGNOSIS — F419 Anxiety disorder, unspecified: Secondary | ICD-10-CM | POA: Diagnosis not present

## 2017-12-29 DIAGNOSIS — Z79899 Other long term (current) drug therapy: Secondary | ICD-10-CM | POA: Diagnosis not present

## 2017-12-29 DIAGNOSIS — I10 Essential (primary) hypertension: Secondary | ICD-10-CM | POA: Diagnosis not present

## 2017-12-29 DIAGNOSIS — Y840 Cardiac catheterization as the cause of abnormal reaction of the patient, or of later complication, without mention of misadventure at the time of the procedure: Secondary | ICD-10-CM | POA: Diagnosis not present

## 2017-12-29 NOTE — Progress Notes (Signed)
Daily Session Note  Patient Details  Name: Dale Kennedy MRN: [id number] Date of Birth: [birth year] Referring Provider:     Cardiac Rehab from 12/16/2017 in [facility] Cardiac and Pulmonary Rehab  Referring Provider  [name]      Encounter Date: 12/29/2017  Check In: Session Check In - 12/29/17 1642      Check-In   Supervising physician immediately available to respond to emergencies  See telemetry face sheet for immediately available ER MD    Location  [facility]-Cardiac & Pulmonary Rehab    Staff Present  Gerlene Burdock, RN, BSN;Meredith Sherryll Burger, RN Vickki Hearing, BA, ACSM CEP, Exercise Physiologist    Medication changes reported      No    Fall or balance concerns reported     No    Tobacco Cessation  No Change    Warm-up and Cool-down  Performed on first and last piece of equipment    Resistance Training Performed  Yes    VAD Patient?  No    PAD/SET Patient?  No      Pain Assessment   Currently in Pain?  No/denies    Pain Score  0-No pain          Social History   Tobacco Use  Smoking Status Former Smoker  . Packs/day: 1.00  . Types: Cigarettes  . Last attempt to quit: 04/06/2008  . Years since quitting: 9.7  Smokeless Tobacco Never Used    Goals Met:  Proper associated with RPD/PD & O2 Sat Independence with exercise equipment Exercise tolerated well No report of cardiac concerns or symptoms Strength training completed today  Goals Unmet:  Not Applicable  Comments:     Dr. Emily Filbert is Medical Director for [place] and [facility] Pulmonary [facility].

## 2017-12-30 ENCOUNTER — Ambulatory Visit
Admit: 2017-12-30 | Discharge: 2017-12-31 | Payer: BLUE CROSS/BLUE SHIELD | Attending: Adult Health | Primary: Adult Health

## 2017-12-30 DIAGNOSIS — I2510 Atherosclerotic heart disease of native coronary artery without angina pectoris: Secondary | ICD-10-CM

## 2017-12-30 DIAGNOSIS — G4733 Obstructive sleep apnea (adult) (pediatric): Secondary | ICD-10-CM

## 2017-12-30 DIAGNOSIS — E785 Hyperlipidemia, unspecified: Secondary | ICD-10-CM

## 2017-12-30 DIAGNOSIS — F419 Anxiety disorder, unspecified: Secondary | ICD-10-CM

## 2017-12-30 DIAGNOSIS — I10 Essential (primary) hypertension: Principal | ICD-10-CM

## 2017-12-30 DIAGNOSIS — Z955 Presence of coronary angioplasty implant and graft: Secondary | ICD-10-CM | POA: Diagnosis not present

## 2017-12-30 DIAGNOSIS — Z7982 Long term (current) use of aspirin: Secondary | ICD-10-CM | POA: Diagnosis not present

## 2017-12-30 DIAGNOSIS — I509 Heart failure, unspecified: Secondary | ICD-10-CM | POA: Diagnosis not present

## 2017-12-30 DIAGNOSIS — Z79899 Other long term (current) drug therapy: Secondary | ICD-10-CM | POA: Diagnosis not present

## 2017-12-30 DIAGNOSIS — I200 Unstable angina: Secondary | ICD-10-CM | POA: Diagnosis not present

## 2017-12-30 DIAGNOSIS — I25110 Atherosclerotic heart disease of native coronary artery with unstable angina pectoris: Secondary | ICD-10-CM | POA: Diagnosis not present

## 2017-12-30 DIAGNOSIS — Z87891 Personal history of nicotine dependence: Secondary | ICD-10-CM | POA: Diagnosis not present

## 2017-12-30 DIAGNOSIS — G709 Myoneural disorder, unspecified: Secondary | ICD-10-CM | POA: Diagnosis not present

## 2017-12-30 DIAGNOSIS — I272 Pulmonary hypertension, unspecified: Secondary | ICD-10-CM | POA: Diagnosis not present

## 2017-12-30 DIAGNOSIS — I110 Hypertensive heart disease with heart failure: Secondary | ICD-10-CM | POA: Diagnosis not present

## 2017-12-30 DIAGNOSIS — I252 Old myocardial infarction: Secondary | ICD-10-CM | POA: Diagnosis not present

## 2017-12-30 DIAGNOSIS — K219 Gastro-esophageal reflux disease without esophagitis: Secondary | ICD-10-CM | POA: Diagnosis not present

## 2017-12-30 LAB — FACTOR 5 LEIDEN

## 2017-12-30 LAB — PROTHROMBIN GENE MUTATION

## 2017-12-30 MED ORDER — ROSUVASTATIN 20 MG TABLET
ORAL_TABLET | Freq: Every day | ORAL | 6 refills | 0.00000 days | Status: CP
Start: 2017-12-30 — End: 2018-04-13

## 2017-12-30 NOTE — Progress Notes (Signed)
Daily Session Note  Patient Details  Name: Dale Kennedy MRN: [id number] Date of Birth: 1975-12-14 Referring Provider:     Cardiac Rehab from 12/16/2017 in [facility] Cardiac and Pulmonary Rehab  Referring Provider  [name]      Encounter Date: 12/30/2017  Check In: Session Check In - 12/30/17 [place]      Check-In   Supervising physician immediately available to respond to emergencies  See telemetry face sheet for immediately available ER MD    Location  [facility]-Cardiac & Pulmonary Rehab    Staff Present  Renita Papa, RN BSN;Joseph [address] [place], [place], ACSM CEP, Exercise Physiologist;Leslie Psychologist, prison and probation services, BSN    Medication changes reported      Yes    Comments  Md took him off of atorvastatin and added him on Crestor    Fall or balance concerns reported     No    Warm-up and Cool-down  Performed on first and last piece of equipment    Resistance Training Performed  Yes    VAD Patient?  No    PAD/SET Patient?  No      Pain Assessment   Currently in Pain?  No/denies          Social History   Tobacco Use  Smoking Status Former Smoker  . Packs/day: 1.00  . Types: Cigarettes  . Last attempt to quit: 04/06/2008  . Years since quitting: 9.7  Smokeless Tobacco Never Used    Goals Met:  Independence with exercise equipment Exercise tolerated well No report of cardiac concerns or symptoms Strength training completed today  Goals Unmet:  Not Applicable  Comments: Pt able to follow exercise prescription today without complaint.  Will continue to monitor for progression.    Dr. Emily Filbert is Medical Director for [place] and [facility] Pulmonary [facility].

## 2017-12-31 ENCOUNTER — Other Ambulatory Visit: Payer: BLUE CROSS/BLUE SHIELD

## 2017-12-31 ENCOUNTER — Encounter: Payer: Self-pay | Admitting: Oncology

## 2017-12-31 NOTE — Addendum Note (Signed)
Addended by: Stryker Veasey, Satira Anis on: 12/31/2017 09:58 AM   Modules accepted: Level of Service

## 2018-01-03 DIAGNOSIS — G709 Myoneural disorder, unspecified: Secondary | ICD-10-CM | POA: Diagnosis not present

## 2018-01-03 DIAGNOSIS — K219 Gastro-esophageal reflux disease without esophagitis: Secondary | ICD-10-CM | POA: Diagnosis not present

## 2018-01-03 DIAGNOSIS — Z79899 Other long term (current) drug therapy: Secondary | ICD-10-CM | POA: Diagnosis not present

## 2018-01-03 DIAGNOSIS — Z955 Presence of coronary angioplasty implant and graft: Secondary | ICD-10-CM

## 2018-01-03 DIAGNOSIS — I252 Old myocardial infarction: Secondary | ICD-10-CM | POA: Diagnosis not present

## 2018-01-03 DIAGNOSIS — I272 Pulmonary hypertension, unspecified: Secondary | ICD-10-CM | POA: Diagnosis not present

## 2018-01-03 DIAGNOSIS — E785 Hyperlipidemia, unspecified: Secondary | ICD-10-CM | POA: Diagnosis not present

## 2018-01-03 DIAGNOSIS — Z7982 Long term (current) use of aspirin: Secondary | ICD-10-CM | POA: Diagnosis not present

## 2018-01-03 DIAGNOSIS — Z87891 Personal history of nicotine dependence: Secondary | ICD-10-CM | POA: Diagnosis not present

## 2018-01-03 DIAGNOSIS — I509 Heart failure, unspecified: Secondary | ICD-10-CM | POA: Diagnosis not present

## 2018-01-03 DIAGNOSIS — I25110 Atherosclerotic heart disease of native coronary artery with unstable angina pectoris: Secondary | ICD-10-CM | POA: Diagnosis not present

## 2018-01-03 DIAGNOSIS — I110 Hypertensive heart disease with heart failure: Secondary | ICD-10-CM | POA: Diagnosis not present

## 2018-01-03 DIAGNOSIS — I200 Unstable angina: Secondary | ICD-10-CM | POA: Diagnosis not present

## 2018-01-03 NOTE — Progress Notes (Signed)
Daily Session Note  Patient Details  Name: Dale Kennedy MRN: [id number] Date of Birth: Jul 15, 1975 Referring Provider:     Cardiac Rehab from 12/16/2017 in [facility] Cardiac and Pulmonary Rehab  Referring Provider  [name]      Encounter Date: 01/03/2018  Check In: Session Check In - 01/03/18 1703      Check-In   Supervising physician immediately available to respond to emergencies  See telemetry face sheet for immediately available ER MD    Location  [facility]-Cardiac & Pulmonary Rehab    Staff Present  Justin Mend Jaci Carrel, BS, ACSM CEP, Exercise Physiologist;Susanne Bice, RN, BSN, CCRP    Medication changes reported      No    Fall or balance concerns reported     No    Warm-up and Cool-down  Performed on first and last piece of equipment    Resistance Training Performed  Yes    VAD Patient?  No    PAD/SET Patient?  No      Pain Assessment   Currently in Pain?  No/denies          Social History   Tobacco Use  Smoking Status Former Smoker  . Packs/day: 1.00  . Types: Cigarettes  . Last attempt to quit: 04/06/2008  . Years since quitting: 9.7  Smokeless Tobacco Never Used    Goals Met:  Independence with exercise equipment Exercise tolerated well No report of cardiac concerns or symptoms Strength training completed today  Goals Unmet:  Not Applicable  Comments: Pt able to follow exercise prescription today without complaint.  Will continue to monitor for progression.    Dr. Emily Filbert is Medical Director for [place] and [facility].

## 2018-01-04 DIAGNOSIS — M6281 Muscle weakness (generalized): Secondary | ICD-10-CM | POA: Diagnosis not present

## 2018-01-04 DIAGNOSIS — R262 Difficulty in walking, not elsewhere classified: Secondary | ICD-10-CM | POA: Diagnosis not present

## 2018-01-04 DIAGNOSIS — M25561 Pain in right knee: Secondary | ICD-10-CM | POA: Diagnosis not present

## 2018-01-04 DIAGNOSIS — G4733 Obstructive sleep apnea (adult) (pediatric): Secondary | ICD-10-CM | POA: Diagnosis not present

## 2018-01-05 ENCOUNTER — Encounter: Payer: BLUE CROSS/BLUE SHIELD | Attending: Family Medicine

## 2018-01-05 DIAGNOSIS — Z79899 Other long term (current) drug therapy: Secondary | ICD-10-CM | POA: Insufficient documentation

## 2018-01-05 DIAGNOSIS — I252 Old myocardial infarction: Secondary | ICD-10-CM | POA: Insufficient documentation

## 2018-01-05 DIAGNOSIS — Z87891 Personal history of nicotine dependence: Secondary | ICD-10-CM | POA: Diagnosis not present

## 2018-01-05 DIAGNOSIS — Z7982 Long term (current) use of aspirin: Secondary | ICD-10-CM | POA: Insufficient documentation

## 2018-01-05 DIAGNOSIS — I272 Pulmonary hypertension, unspecified: Secondary | ICD-10-CM | POA: Diagnosis not present

## 2018-01-05 DIAGNOSIS — I509 Heart failure, unspecified: Secondary | ICD-10-CM | POA: Insufficient documentation

## 2018-01-05 DIAGNOSIS — G709 Myoneural disorder, unspecified: Secondary | ICD-10-CM | POA: Insufficient documentation

## 2018-01-05 DIAGNOSIS — Z955 Presence of coronary angioplasty implant and graft: Secondary | ICD-10-CM | POA: Diagnosis not present

## 2018-01-05 DIAGNOSIS — I25110 Atherosclerotic heart disease of native coronary artery with unstable angina pectoris: Secondary | ICD-10-CM | POA: Insufficient documentation

## 2018-01-05 DIAGNOSIS — E785 Hyperlipidemia, unspecified: Secondary | ICD-10-CM | POA: Diagnosis not present

## 2018-01-05 DIAGNOSIS — I213 ST elevation (STEMI) myocardial infarction of unspecified site: Secondary | ICD-10-CM

## 2018-01-05 DIAGNOSIS — I200 Unstable angina: Secondary | ICD-10-CM | POA: Diagnosis not present

## 2018-01-05 DIAGNOSIS — I110 Hypertensive heart disease with heart failure: Secondary | ICD-10-CM | POA: Insufficient documentation

## 2018-01-05 DIAGNOSIS — K219 Gastro-esophageal reflux disease without esophagitis: Secondary | ICD-10-CM | POA: Diagnosis not present

## 2018-01-05 NOTE — Progress Notes (Signed)
Daily Session Note  Patient Details  Name: Dale Kennedy MRN: [id number] Date of Birth: [birth year] Referring Provider:     Cardiac Rehab from 12/16/2017 in [facility] Cardiac and Pulmonary Rehab  Referring Provider  [name]      Encounter Date: 01/05/2018  Check In: Session Check In - 01/05/18 1635      Check-In   Supervising physician immediately available to respond to emergencies  See telemetry face sheet for immediately available ER MD    Location  [facility]-Cardiac & Pulmonary Rehab    Staff Present  Renita Papa, RN Vickki Hearing, BA, ACSM CEP, Exercise Physiologist;Susanne Bice, RN, BSN, CCRP    Medication changes reported      No    Fall or balance concerns reported     No    Warm-up and Cool-down  Performed on first and last piece of equipment    Resistance Training Performed  Yes    VAD Patient?  No    PAD/SET Patient?  No      Pain Assessment   Currently in Pain?  No/denies    Multiple Pain Sites  No          Social History   Tobacco Use  Smoking Status Former Smoker  . Packs/day: 1.00  . Types: Cigarettes  . Last attempt to quit: 04/06/2008  . Years since quitting: 9.7  Smokeless Tobacco Never Used    Goals Met:  Independence with exercise equipment Exercise tolerated well No report of cardiac concerns or symptoms Strength training completed today  Goals Unmet:  Not Applicable  Comments: Pt able to follow exercise prescription today without complaint.  Will continue to monitor for progression.    Dr. Emily Filbert is Medical Director for [place] and [facility].

## 2018-01-06 DIAGNOSIS — I252 Old myocardial infarction: Secondary | ICD-10-CM | POA: Diagnosis not present

## 2018-01-06 DIAGNOSIS — I272 Pulmonary hypertension, unspecified: Secondary | ICD-10-CM | POA: Diagnosis not present

## 2018-01-06 DIAGNOSIS — I110 Hypertensive heart disease with heart failure: Secondary | ICD-10-CM | POA: Diagnosis not present

## 2018-01-06 DIAGNOSIS — Z955 Presence of coronary angioplasty implant and graft: Secondary | ICD-10-CM | POA: Diagnosis not present

## 2018-01-06 DIAGNOSIS — Z7982 Long term (current) use of aspirin: Secondary | ICD-10-CM | POA: Diagnosis not present

## 2018-01-06 DIAGNOSIS — Z79899 Other long term (current) drug therapy: Secondary | ICD-10-CM | POA: Diagnosis not present

## 2018-01-06 DIAGNOSIS — K219 Gastro-esophageal reflux disease without esophagitis: Secondary | ICD-10-CM | POA: Diagnosis not present

## 2018-01-06 DIAGNOSIS — G709 Myoneural disorder, unspecified: Secondary | ICD-10-CM | POA: Diagnosis not present

## 2018-01-06 DIAGNOSIS — I509 Heart failure, unspecified: Secondary | ICD-10-CM | POA: Diagnosis not present

## 2018-01-06 DIAGNOSIS — Z87891 Personal history of nicotine dependence: Secondary | ICD-10-CM | POA: Diagnosis not present

## 2018-01-06 DIAGNOSIS — I25110 Atherosclerotic heart disease of native coronary artery with unstable angina pectoris: Secondary | ICD-10-CM | POA: Diagnosis not present

## 2018-01-06 DIAGNOSIS — I200 Unstable angina: Secondary | ICD-10-CM | POA: Diagnosis not present

## 2018-01-06 DIAGNOSIS — E785 Hyperlipidemia, unspecified: Secondary | ICD-10-CM | POA: Diagnosis not present

## 2018-01-06 NOTE — Progress Notes (Signed)
Daily Session Note  Patient Details  Name: Dale Kennedy MRN: [id number] Date of Birth: 1976-03-02 Referring Provider:     Cardiac Rehab from 12/16/2017 in [facility] Cardiac and Pulmonary Rehab  Referring Provider  [name]      Encounter Date: 01/06/2018  Check In: Session Check In - 01/06/18 1612      Check-In   Supervising physician immediately available to respond to emergencies  See telemetry face sheet for immediately available ER MD    Location  [facility]-Cardiac & Pulmonary Rehab    Staff Present  Justin Mend RCP,RRT,BSRT;Meredith Sherryll Burger, RN Moises Blood, BS, ACSM CEP, Exercise Physiologist    Medication changes reported      No    Fall or balance concerns reported     No    Warm-up and Cool-down  Performed on first and last piece of equipment    Resistance Training Performed  Yes    VAD Patient?  No    PAD/SET Patient?  No      Pain Assessment   Currently in Pain?  No/denies          Social History   Tobacco Use  Smoking Status Former Smoker  . Packs/day: 1.00  . Types: Cigarettes  . Last attempt to quit: 04/06/2008  . Years since quitting: 9.7  Smokeless Tobacco Never Used    Goals Met:  Independence with exercise equipment Exercise tolerated well No report of cardiac concerns or symptoms Strength training completed today  Goals Unmet:  Not Applicable  Comments: Pt able to follow exercise prescription today without complaint.  Will continue to monitor for progression.  Reviewed home exercise with pt today.  Pt plans to use [employer] and [employer] memberhships for exercise.  Reviewed THR, pulse, RPE, sign and symptoms, NTG use, and when to call 911 or MD.  Also discussed weather considerations and indoor options.  Pt voiced understanding.     Dr. Emily Filbert is Medical Director for [place] and [facility].

## 2018-01-07 MED ORDER — CARVEDILOL 6.25 MG TABLET
ORAL_TABLET | Freq: Two times a day (BID) | ORAL | 11 refills | 0 days | Status: CP
Start: 2018-01-07 — End: 2018-04-13

## 2018-01-10 ENCOUNTER — Encounter: Admit: 2018-01-10 | Discharge: 2018-01-11 | Payer: BLUE CROSS/BLUE SHIELD | Attending: Clinical | Primary: Clinical

## 2018-01-10 DIAGNOSIS — F4323 Adjustment disorder with mixed anxiety and depressed mood: Principal | ICD-10-CM

## 2018-01-10 DIAGNOSIS — I272 Pulmonary hypertension, unspecified: Secondary | ICD-10-CM | POA: Diagnosis not present

## 2018-01-10 DIAGNOSIS — Z87891 Personal history of nicotine dependence: Secondary | ICD-10-CM | POA: Diagnosis not present

## 2018-01-10 DIAGNOSIS — Z7982 Long term (current) use of aspirin: Secondary | ICD-10-CM | POA: Diagnosis not present

## 2018-01-10 DIAGNOSIS — I110 Hypertensive heart disease with heart failure: Secondary | ICD-10-CM | POA: Diagnosis not present

## 2018-01-10 DIAGNOSIS — Z79899 Other long term (current) drug therapy: Secondary | ICD-10-CM | POA: Diagnosis not present

## 2018-01-10 DIAGNOSIS — K219 Gastro-esophageal reflux disease without esophagitis: Secondary | ICD-10-CM | POA: Diagnosis not present

## 2018-01-10 DIAGNOSIS — I200 Unstable angina: Secondary | ICD-10-CM | POA: Diagnosis not present

## 2018-01-10 DIAGNOSIS — Z955 Presence of coronary angioplasty implant and graft: Secondary | ICD-10-CM | POA: Diagnosis not present

## 2018-01-10 DIAGNOSIS — I252 Old myocardial infarction: Secondary | ICD-10-CM | POA: Diagnosis not present

## 2018-01-10 DIAGNOSIS — I509 Heart failure, unspecified: Secondary | ICD-10-CM | POA: Diagnosis not present

## 2018-01-10 DIAGNOSIS — I25110 Atherosclerotic heart disease of native coronary artery with unstable angina pectoris: Secondary | ICD-10-CM | POA: Diagnosis not present

## 2018-01-10 DIAGNOSIS — G709 Myoneural disorder, unspecified: Secondary | ICD-10-CM | POA: Diagnosis not present

## 2018-01-10 DIAGNOSIS — E785 Hyperlipidemia, unspecified: Secondary | ICD-10-CM | POA: Diagnosis not present

## 2018-01-10 DIAGNOSIS — I213 ST elevation (STEMI) myocardial infarction of unspecified site: Secondary | ICD-10-CM

## 2018-01-10 NOTE — Progress Notes (Signed)
Daily Session Note  Patient Details  Name: Dale Kennedy MRN: [id number] Date of Birth: [birth year] Referring Provider:     Cardiac Rehab from 12/16/2017 in [facility] Cardiac and Pulmonary Rehab  Referring Provider  [name]      Encounter Date: 01/10/2018  Check In: Session Check In - 01/10/18 1718      Check-In   Supervising physician immediately available to respond to emergencies  See telemetry face sheet for immediately available ER MD    Location  [facility]-Cardiac & Pulmonary Rehab    Staff Present  Renita Papa, RN Vickki Hearing, BA, ACSM CEP, Exercise Physiologist;Kelly Amedeo Plenty, BS, ACSM CEP, Exercise Physiologist;Carroll Enterkin, RN, BSN    Medication changes reported      No    Fall or balance concerns reported     No    Tobacco Cessation  No Change    Warm-up and Cool-down  Performed on first and last piece of equipment    Resistance Training Performed  Yes    VAD Patient?  No    PAD/SET Patient?  No      Pain Assessment   Currently in Pain?  No/denies    Multiple Pain Sites  No          Social History   Tobacco Use  Smoking Status Former Smoker  . Packs/day: 1.00  . Types: Cigarettes  . Last attempt to quit: 04/06/2008  . Years since quitting: 9.7  Smokeless Tobacco Never Used    Goals Met:  Independence with exercise equipment Exercise tolerated well No report of cardiac concerns or symptoms Strength training completed today  Goals Unmet:  Not Applicable  Comments: Pt able to follow exercise prescription today without complaint.  Will continue to monitor for progression.    Dr. Emily Filbert is Medical Director for [place] and [facility].
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
## 2018-01-12 ENCOUNTER — Encounter: Payer: Self-pay | Admitting: *Deleted

## 2018-01-12 ENCOUNTER — Encounter: Payer: BLUE CROSS/BLUE SHIELD | Admitting: *Deleted

## 2018-01-12 DIAGNOSIS — G709 Myoneural disorder, unspecified: Secondary | ICD-10-CM | POA: Diagnosis not present

## 2018-01-12 DIAGNOSIS — I213 ST elevation (STEMI) myocardial infarction of unspecified site: Secondary | ICD-10-CM

## 2018-01-12 DIAGNOSIS — Z955 Presence of coronary angioplasty implant and graft: Secondary | ICD-10-CM | POA: Diagnosis not present

## 2018-01-12 DIAGNOSIS — E785 Hyperlipidemia, unspecified: Secondary | ICD-10-CM | POA: Diagnosis not present

## 2018-01-12 DIAGNOSIS — I272 Pulmonary hypertension, unspecified: Secondary | ICD-10-CM | POA: Diagnosis not present

## 2018-01-12 DIAGNOSIS — Z7982 Long term (current) use of aspirin: Secondary | ICD-10-CM | POA: Diagnosis not present

## 2018-01-12 DIAGNOSIS — Z87891 Personal history of nicotine dependence: Secondary | ICD-10-CM | POA: Diagnosis not present

## 2018-01-12 DIAGNOSIS — I200 Unstable angina: Secondary | ICD-10-CM | POA: Diagnosis not present

## 2018-01-12 DIAGNOSIS — I252 Old myocardial infarction: Secondary | ICD-10-CM | POA: Diagnosis not present

## 2018-01-12 DIAGNOSIS — K219 Gastro-esophageal reflux disease without esophagitis: Secondary | ICD-10-CM | POA: Diagnosis not present

## 2018-01-12 DIAGNOSIS — Z79899 Other long term (current) drug therapy: Secondary | ICD-10-CM | POA: Diagnosis not present

## 2018-01-12 DIAGNOSIS — I110 Hypertensive heart disease with heart failure: Secondary | ICD-10-CM | POA: Diagnosis not present

## 2018-01-12 DIAGNOSIS — I509 Heart failure, unspecified: Secondary | ICD-10-CM | POA: Diagnosis not present

## 2018-01-12 DIAGNOSIS — I25110 Atherosclerotic heart disease of native coronary artery with unstable angina pectoris: Secondary | ICD-10-CM | POA: Diagnosis not present

## 2018-01-12 NOTE — Progress Notes (Signed)
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  Counselor will continue to follow with Nicole Kindred.         Expected Outcomes  Short:  Jayro will speak to his Dr. about a more effective medication when he is experiencing acid reflux.  He will also attend the educational and psychoeducational components of this program to learn how to eat to reduce acid reflux and ways to manage stress more positively in his life.  Long:  Jeison will continue to exercise consistently and complete this program.         Continue Psychosocial Services   Follow up required by counselor          Psychosocial Discharge (Final Psychosocial Re-Evaluation): Psychosocial Re-Evaluation - 01/03/18 1714      Psychosocial Re-Evaluation   Current issues with  Current Anxiety/Panic;Current Sleep Concerns;Current Depression    Comments  Counselor follow up with Nicole Kindred today reporting he is coping "better" and decided not to pursue the Grief Share groups at this time.  He also  states he has less anxiety since he began this program and has only had one panic attack this past week, when they were almost daily prior to this.  Haze continues to have problems sleeping well but this is improving "a little" and he reports his mood has improved somewhat as well.  Thadd states he has acid reflux quite often which impacts his sleep and his mood on occasion.  Counselor encouraged him to speak with his Dr. about this and possibly changing his medications to something that is more effective for acid reflux.  Counselor will continue to follow with Nicole Kindred.      Expected Outcomes  Short:  Kimmie will speak to his Dr. about a more effective medication when he is experiencing acid reflux.  He will also attend the educational and psychoeducational components of this program to learn how to eat to reduce acid reflux and ways to manage stress more positively in his life.  Long:  Prestin will continue to exercise consistently and complete this program.      Continue Psychosocial Services   Follow up required by counselor       Vocational Rehabilitation: Provide vocational rehab assistance to qualifying candidates.   Vocational Rehab Evaluation & Intervention: Vocational Rehab - 12/16/17 1440      Initial Vocational Rehab Evaluation & Intervention   Assessment shows need for Vocational Rehabilitation  No   if he changes his mind, aware we can send info at a later time.       Education: Education Goals: Education classes will be provided on a variety of topics geared toward better understanding of heart health and risk factor modification. Participant will state understanding/return  demonstration of topics presented as noted by education test scores.  Learning Barriers/Preferences: Learning Barriers/Preferences - 12/16/17 1439      Learning Barriers/Preferences   Learning Barriers  None    Learning Preferences  None       Education Topics:  AED/CPR: - Group verbal and written instruction  with the use of models to demonstrate the basic use of the AED with the basic ABC's of resuscitation.   Cardiac Rehab from 01/10/2018 in [facility] Cardiac and Pulmonary Rehab  Date  12/20/17  Educator  CE  Instruction Review Code  1- Verbalizes Understanding      General Nutrition Guidelines/Fats and Fiber: -Group instruction provided by verbal, written material, models and posters to present the general guidelines for heart healthy nutrition. Gives an explanation and review of dietary fats and fiber.   Controlling Sodium/Reading Food Labels: -Group verbal and written material supporting the discussion of sodium use in heart healthy nutrition. Review and explanation with models, verbal and written materials for utilization of the food label.   Cardiac Rehab from 01/10/2018 in [facility] Cardiac and Pulmonary Rehab  Date  12/22/17  Educator  LB  Instruction Review Code  1- Verbalizes Understanding      Exercise Physiology & General Exercise Guidelines: - Group verbal and written instruction with models to review the exercise physiology of the cardiovascular system and associated critical values. Provides general exercise guidelines with specific guidelines to those with heart or lung disease.    Cardiac Rehab from 01/10/2018 in [facility] Cardiac and Pulmonary Rehab  Date  12/29/17  Educator  Nada Maclachlan, EP  Instruction Review Code  1- Verbalizes Understanding      Aerobic Exercise & Resistance Training: - Gives group verbal and written instruction on the various components of exercise. Focuses on aerobic and resistive training programs and the benefits of this training and how to safely progress through these programs..   Cardiac Rehab from 01/10/2018 in [facility] Cardiac and Pulmonary Rehab  Date  01/03/18  Educator  [facility]  Instruction Review Code  1- Geologist, engineering, Balance, Mind/Body Relaxation: Provides group verbal/written instruction on the benefits of flexibility and  balance training, including mind/body exercise modes such as yoga, pilates and tai chi.  Demonstration and skill practice provided.   Cardiac Rehab from 01/10/2018 in [facility] Cardiac and Pulmonary Rehab  Date  01/10/18  Educator  AS  Instruction Review Code  1- Verbalizes Understanding      Stress and Anxiety: - Provides group verbal and written instruction about the health risks of elevated stress and causes of high stress.  Discuss the correlation between heart/lung disease and anxiety and treatment options. Review healthy ways to manage with stress and anxiety.   Depression: - Provides group verbal and written instruction on the correlation between heart/lung disease and depressed mood, treatment options, and the stigmas associated with seeking treatment.   Cardiac Rehab from 01/10/2018 in [facility] Cardiac and Pulmonary Rehab  Date  01/05/18  Educator  [facility]  Instruction Review Code  1- Verbalizes Understanding      Anatomy & Physiology of the Heart: - Group verbal and written instruction and models provide basic cardiac anatomy and physiology, with the coronary electrical and arterial systems. Review of Valvular disease and Heart Failure   Cardiac Procedures: - Group verbal and written instruction to review commonly prescribed medications for heart disease. Reviews the medication, class of the drug, and side effects. Includes the steps to properly store meds and  maintain the prescription regimen. (beta blockers and nitrates)   Cardiac Medications I: - Group verbal and written instruction to review commonly prescribed medications for heart disease. Reviews the medication, class of the drug, and side effects. Includes the steps to properly store meds and maintain the prescription regimen.   Cardiac Medications II: -Group verbal and written instruction to review commonly prescribed medications for heart disease. Reviews the medication, class of the drug, and side effects. (all other drug  classes)    Go Sex-Intimacy & Heart Disease, Get SMART - Goal Setting: - Group verbal and written instruction through game format to discuss heart disease and the return to sexual intimacy. Provides group verbal and written material to discuss and apply goal setting through the application of the S.M.A.R.T. Method.   Other Matters of the Heart: - Provides group verbal, written materials and models to describe Stable Angina and Peripheral Artery. Includes description of the disease process and treatment options available to the cardiac patient.   Exercise & Equipment Safety: - Individual verbal instruction and demonstration of equipment use and safety with use of the equipment.   Cardiac Rehab from 01/10/2018 in [facility] Cardiac and Pulmonary Rehab  Date  12/16/17  Educator  [facility]  Instruction Review Code  1- Verbalizes Understanding      Infection Prevention: - Provides verbal and written material to individual with discussion of infection control including proper hand washing and proper equipment cleaning during exercise session.   Cardiac Rehab from 01/10/2018 in [facility] Cardiac and Pulmonary Rehab  Date  12/16/17  Educator  [facility]  Instruction Review Code  1- Verbalizes Understanding      Falls Prevention: - Provides verbal and written material to individual with discussion of falls prevention and safety.   Cardiac Rehab from 01/10/2018 in [facility] Cardiac and Pulmonary Rehab  Date  12/16/17  Educator  [facility]  Instruction Review Code  1- Verbalizes Understanding      Diabetes: - Individual verbal and written instruction to review signs/symptoms of diabetes, desired ranges of glucose level fasting, after meals and with exercise. Acknowledge that pre and post exercise glucose checks will be done for 3 sessions at entry of program.   Know Your Numbers and Risk Factors: -Group verbal and written instruction about important numbers in your health.  Discussion of what are risk factors and how they  play a role in the disease process.  Review of Cholesterol, Blood Pressure, Diabetes, and BMI and the role they play in your overall health.   Sleep Hygiene: -Provides group verbal and written instruction about how sleep can affect your health.  Define sleep hygiene, discuss sleep cycles and impact of sleep habits. Review good sleep hygiene tips.    Other: -Provides group and verbal instruction on various topics (see comments)   Knowledge Questionnaire Score: Knowledge Questionnaire Score - 12/16/17 1439      Knowledge Questionnaire Score   Pre Score  21/25   Reviewed correct answers with Vicki who verbalized understanding.       Core Components/Risk Factors/Patient Goals at Admission: Personal Goals and Risk Factors at Admission - 12/16/17 1441      Core Components/Risk Factors/Patient Goals on Admission    Weight Management  Yes;Obesity    Intervention  Weight Management: Develop a combined nutrition and exercise program designed to reach desired caloric intake, while maintaining appropriate intake of nutrient and fiber, sodium and fats, and appropriate energy expenditure required for the weight goal.;Weight Management: Provide education and appropriate resources to help  participant work on and attain dietary goals.;Weight Management/Obesity: Establish reasonable short term and long term weight goals.;Obesity: Provide education and appropriate resources to help participant work on and attain dietary goals.    Admit Weight  235 lb 3.2 oz (106.7 kg)    Goal Weight: Short Term  225 lb (102.1 kg)    Goal Weight: Long Term  200 lb (90.7 kg)    Expected Outcomes  Short Term: Continue to assess and modify interventions until short term weight is achieved;Long Term: Adherence to nutrition and physical activity/exercise program aimed toward attainment of established weight goal;Weight Maintenance: Understanding of the daily nutrition guidelines, which includes 25-35% calories from fat, 7% or  less cal from saturated fats, less than '200mg'$  cholesterol, less than 1.5gm of sodium, & 5 or more servings of fruits and vegetables daily;Weight Loss: Understanding of general recommendations for a balanced deficit meal plan, which promotes 1-2 lb weight loss per week and includes a negative energy balance of [phone] kcal/d;Understanding recommendations for meals to include 15-35% energy as protein, 25-35% energy from fat, 35-60% energy from carbohydrates, less than '200mg'$  of dietary cholesterol, 20-35 gm of total fiber daily;Understanding of distribution of calorie intake throughout the day with the consumption of 4-5 meals/snacks;Weight Gain: Understanding of general recommendations for a high calorie, high protein meal plan that promotes weight gain by distributing calorie intake throughout the day with the consumption for 4-5 meals, snacks, and/or supplements    Improve shortness of breath with ADL's  Yes    Intervention  Provide education, individualized exercise plan and daily activity instruction to help decrease symptoms of SOB with activities of daily living.    Expected Outcomes  Short Term: Improve cardiorespiratory fitness to achieve a reduction of symptoms when performing ADLs;Long Term: Be able to perform more ADLs without symptoms or delay the onset of symptoms    Hypertension  Yes    Intervention  Provide education on lifestyle modifcations including regular physical activity/exercise, weight management, moderate sodium restriction and increased consumption of fresh fruit, vegetables, and low fat dairy, alcohol moderation, and smoking cessation.;Monitor prescription use compliance.    Expected Outcomes  Short Term: Continued assessment and intervention until BP is < 140/9m HG in hypertensive participants. < 130/881mHG in hypertensive participants with diabetes, heart failure or chronic kidney disease.;Long Term: Maintenance of blood pressure at goal levels.    Lipids  Yes    Intervention   Provide education and support for participant on nutrition & aerobic/resistive exercise along with prescribed medications to achieve LDL '70mg'$ , HDL >'40mg'$ .    Expected Outcomes  Short Term: Participant states understanding of desired cholesterol values and is compliant with medications prescribed. Participant is following exercise prescription and nutrition guidelines.;Long Term: Cholesterol controlled with medications as prescribed, with individualized exercise RX and with personalized nutrition plan. Value goals: LDL < '70mg'$ , HDL > 40 mg.    Stress  Yes    Intervention  Offer individual and/or small group education and counseling on adjustment to heart disease, stress management and health-related lifestyle change. Teach and support self-help strategies.;Refer participants experiencing significant psychosocial distress to appropriate mental health specialists for further evaluation and treatment. When possible, include family members and significant others in education/counseling sessions.    Expected Outcomes  Short Term: Participant demonstrates changes in health-related behavior, relaxation and other stress management skills, ability to obtain effective social support, and compliance with psychotropic medications if prescribed.;Long Term: Emotional wellbeing is indicated by absence of clinically significant psychosocial distress or social isolation.  Core Components/Risk Factors/Patient Goals Review:    Core Components/Risk Factors/Patient Goals at Discharge (Final Review):    ITP Comments: ITP Comments    Row Name 12/16/17 1429 12/29/17 1646 01/12/18 0651       ITP Comments  Medical Review Completed; initial ITP created. Diagnosis Documentation can be found in [facility] encounter dated 12/07/2017.  Amadeus said the MD's decided not to give him another stent since he wasn't c/o of chest pain or angina  30 day review.  Continue with ITP unless directed changes per Medical Director review.         Comments:

## 2018-01-12 NOTE — Progress Notes (Signed)
Daily Session Note  Patient Details  Name: Dale Kennedy MRN: [id number] Date of Birth: 10-Jan-1976 Referring Provider:     Cardiac Rehab from 12/16/2017 in [facility] Cardiac and Pulmonary Rehab  Referring Provider  [name]      Encounter Date: 01/12/2018  Check In: Session Check In - 01/12/18 1627      Check-In   Supervising physician immediately available to respond to emergencies  See telemetry face sheet for immediately available ER MD    Location  [facility]-Cardiac & Pulmonary Rehab    Staff Present  Renita Papa, RN Vickki Hearing, BA, ACSM CEP, Exercise Physiologist;Juluis Fitzsimmons, RN, BSN    Medication changes reported      No    Fall or balance concerns reported     No    Tobacco Cessation  No Change    Warm-up and Cool-down  Performed on first and last piece of equipment    Resistance Training Performed  Yes    VAD Patient?  No    PAD/SET Patient?  No      Pain Assessment   Currently in Pain?  No/denies          Social History   Tobacco Use  Smoking Status Former Smoker  . Packs/day: 1.00  . Types: Cigarettes  . Last attempt to quit: 04/06/2008  . Years since quitting: 9.7  Smokeless Tobacco Never Used    Goals Met:  Proper associated with RPD/PD & O2 Sat Independence with exercise equipment No report of cardiac concerns or symptoms Strength training completed today  Goals Unmet:  Not Applicable  Comments:     Dr. Emily Filbert is Medical Director for [place] and [facility].

## 2018-01-13 DIAGNOSIS — I252 Old myocardial infarction: Secondary | ICD-10-CM | POA: Diagnosis not present

## 2018-01-13 DIAGNOSIS — Z955 Presence of coronary angioplasty implant and graft: Secondary | ICD-10-CM | POA: Diagnosis not present

## 2018-01-13 DIAGNOSIS — I272 Pulmonary hypertension, unspecified: Secondary | ICD-10-CM | POA: Diagnosis not present

## 2018-01-13 DIAGNOSIS — K219 Gastro-esophageal reflux disease without esophagitis: Secondary | ICD-10-CM | POA: Diagnosis not present

## 2018-01-13 DIAGNOSIS — I110 Hypertensive heart disease with heart failure: Secondary | ICD-10-CM | POA: Diagnosis not present

## 2018-01-13 DIAGNOSIS — Z79899 Other long term (current) drug therapy: Secondary | ICD-10-CM | POA: Diagnosis not present

## 2018-01-13 DIAGNOSIS — I509 Heart failure, unspecified: Secondary | ICD-10-CM | POA: Diagnosis not present

## 2018-01-13 DIAGNOSIS — I200 Unstable angina: Secondary | ICD-10-CM | POA: Diagnosis not present

## 2018-01-13 DIAGNOSIS — E785 Hyperlipidemia, unspecified: Secondary | ICD-10-CM | POA: Diagnosis not present

## 2018-01-13 DIAGNOSIS — I25110 Atherosclerotic heart disease of native coronary artery with unstable angina pectoris: Secondary | ICD-10-CM | POA: Diagnosis not present

## 2018-01-13 DIAGNOSIS — G709 Myoneural disorder, unspecified: Secondary | ICD-10-CM | POA: Diagnosis not present

## 2018-01-13 DIAGNOSIS — Z87891 Personal history of nicotine dependence: Secondary | ICD-10-CM | POA: Diagnosis not present

## 2018-01-13 DIAGNOSIS — Z7982 Long term (current) use of aspirin: Secondary | ICD-10-CM | POA: Diagnosis not present

## 2018-01-13 NOTE — Progress Notes (Signed)
Daily Session Note  Patient Details  Name: Dale Kennedy MRN: [id number] Date of Birth: 10/23/1975 Referring Provider:     Cardiac Rehab from 12/16/2017 in [facility] Cardiac and Pulmonary Rehab  Referring Provider  [name]      Encounter Date: 01/13/2018  Check In: Session Check In - 01/13/18 1624      Check-In   Supervising physician immediately available to respond to emergencies  See telemetry face sheet for immediately available ER MD    Location  [facility]-Cardiac & Pulmonary Rehab    Staff Present  Renita Papa, RN BSN;Joseph Hood RCP,RRT,BSRT;Heath Lark, RN, BSN, CCRP;Vida Rigger RN, BSN    Medication changes reported      No    Fall or balance concerns reported     No    Warm-up and Cool-down  Performed on first and last piece of equipment    Resistance Training Performed  Yes    VAD Patient?  No    PAD/SET Patient?  No      Pain Assessment   Currently in Pain?  No/denies          Social History   Tobacco Use  Smoking Status Former Smoker  . Packs/day: 1.00  . Types: Cigarettes  . Last attempt to quit: 04/06/2008  . Years since quitting: 9.7  Smokeless Tobacco Never Used    Goals Met:  Independence with exercise equipment Exercise tolerated well No report of cardiac concerns or symptoms Strength training completed today  Goals Unmet:  Not Applicable  Comments: Pt able to follow exercise prescription today without complaint.  Will continue to monitor for progression.    Dr. Emily Filbert is Medical Director for [place] and [facility].

## 2018-01-14 ENCOUNTER — Inpatient Hospital Stay: Payer: BLUE CROSS/BLUE SHIELD | Admitting: Oncology

## 2018-01-14 ENCOUNTER — Ambulatory Visit: Payer: BLUE CROSS/BLUE SHIELD | Admitting: Oncology

## 2018-01-14 ENCOUNTER — Inpatient Hospital Stay: Payer: BLUE CROSS/BLUE SHIELD
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
## 2018-01-17 DIAGNOSIS — I200 Unstable angina: Secondary | ICD-10-CM | POA: Diagnosis not present

## 2018-01-17 DIAGNOSIS — Z79899 Other long term (current) drug therapy: Secondary | ICD-10-CM | POA: Diagnosis not present

## 2018-01-17 DIAGNOSIS — Z955 Presence of coronary angioplasty implant and graft: Secondary | ICD-10-CM | POA: Diagnosis not present

## 2018-01-17 DIAGNOSIS — Z7982 Long term (current) use of aspirin: Secondary | ICD-10-CM | POA: Diagnosis not present

## 2018-01-17 DIAGNOSIS — I252 Old myocardial infarction: Secondary | ICD-10-CM | POA: Diagnosis not present

## 2018-01-17 DIAGNOSIS — I213 ST elevation (STEMI) myocardial infarction of unspecified site: Secondary | ICD-10-CM

## 2018-01-17 DIAGNOSIS — I272 Pulmonary hypertension, unspecified: Secondary | ICD-10-CM | POA: Diagnosis not present

## 2018-01-17 DIAGNOSIS — Z87891 Personal history of nicotine dependence: Secondary | ICD-10-CM | POA: Diagnosis not present

## 2018-01-17 DIAGNOSIS — I509 Heart failure, unspecified: Secondary | ICD-10-CM | POA: Diagnosis not present

## 2018-01-17 DIAGNOSIS — G709 Myoneural disorder, unspecified: Secondary | ICD-10-CM | POA: Diagnosis not present

## 2018-01-17 DIAGNOSIS — K219 Gastro-esophageal reflux disease without esophagitis: Secondary | ICD-10-CM | POA: Diagnosis not present

## 2018-01-17 DIAGNOSIS — I110 Hypertensive heart disease with heart failure: Secondary | ICD-10-CM | POA: Diagnosis not present

## 2018-01-17 DIAGNOSIS — E785 Hyperlipidemia, unspecified: Secondary | ICD-10-CM | POA: Diagnosis not present

## 2018-01-17 DIAGNOSIS — I25110 Atherosclerotic heart disease of native coronary artery with unstable angina pectoris: Secondary | ICD-10-CM | POA: Diagnosis not present

## 2018-01-17 NOTE — Progress Notes (Signed)
Daily Session Note  Patient Details  Name: Dale Kennedy MRN: [id number] Date of Birth: 01-14-1976 Referring Provider:     Cardiac Rehab from 12/16/2017 in [facility] Cardiac and Pulmonary Rehab  Referring Provider  [name]      Encounter Date: 01/17/2018  Check In: Session Check In - 01/17/18 1719      Check-In   Supervising physician immediately available to respond to emergencies  See telemetry face sheet for immediately available ER MD    Location  [facility]-Cardiac & Pulmonary Rehab    Staff Present  Nyoka Cowden, RN, BSN, Kela Millin, BA, ACSM CEP, Exercise Physiologist;Kelly Amedeo Plenty, BS, ACSM CEP, Exercise Physiologist    Medication changes reported      No    Fall or balance concerns reported     No    Warm-up and Cool-down  Performed on first and last piece of equipment    Resistance Training Performed  Yes    VAD Patient?  No    PAD/SET Patient?  No      Pain Assessment   Currently in Pain?  No/denies    Multiple Pain Sites  No          Social History   Tobacco Use  Smoking Status Former Smoker  . Packs/day: 1.00  . Types: Cigarettes  . Last attempt to quit: 04/06/2008  . Years since quitting: 9.7  Smokeless Tobacco Never Used    Goals Met:  Independence with exercise equipment Exercise tolerated well No report of cardiac concerns or symptoms Strength training completed today  Goals Unmet:  Not Applicable  Comments: Pt able to follow exercise prescription today without complaint.  Will continue to monitor for progression.    Dr. Emily Filbert is Medical Director for [place] and [facility].

## 2018-01-19 ENCOUNTER — Encounter: Payer: BLUE CROSS/BLUE SHIELD | Admitting: *Deleted

## 2018-01-19 DIAGNOSIS — I110 Hypertensive heart disease with heart failure: Secondary | ICD-10-CM | POA: Diagnosis not present

## 2018-01-19 DIAGNOSIS — I200 Unstable angina: Secondary | ICD-10-CM | POA: Diagnosis not present

## 2018-01-19 DIAGNOSIS — I509 Heart failure, unspecified: Secondary | ICD-10-CM | POA: Diagnosis not present

## 2018-01-19 DIAGNOSIS — K219 Gastro-esophageal reflux disease without esophagitis: Secondary | ICD-10-CM | POA: Diagnosis not present

## 2018-01-19 DIAGNOSIS — Z7982 Long term (current) use of aspirin: Secondary | ICD-10-CM | POA: Diagnosis not present

## 2018-01-19 DIAGNOSIS — G709 Myoneural disorder, unspecified: Secondary | ICD-10-CM | POA: Diagnosis not present

## 2018-01-19 DIAGNOSIS — Z955 Presence of coronary angioplasty implant and graft: Secondary | ICD-10-CM | POA: Diagnosis not present

## 2018-01-19 DIAGNOSIS — E785 Hyperlipidemia, unspecified: Secondary | ICD-10-CM | POA: Diagnosis not present

## 2018-01-19 DIAGNOSIS — Z79899 Other long term (current) drug therapy: Secondary | ICD-10-CM | POA: Diagnosis not present

## 2018-01-19 DIAGNOSIS — I252 Old myocardial infarction: Secondary | ICD-10-CM | POA: Diagnosis not present

## 2018-01-19 DIAGNOSIS — I272 Pulmonary hypertension, unspecified: Secondary | ICD-10-CM | POA: Diagnosis not present

## 2018-01-19 DIAGNOSIS — I25110 Atherosclerotic heart disease of native coronary artery with unstable angina pectoris: Secondary | ICD-10-CM | POA: Diagnosis not present

## 2018-01-19 DIAGNOSIS — Z87891 Personal history of nicotine dependence: Secondary | ICD-10-CM | POA: Diagnosis not present

## 2018-01-19 DIAGNOSIS — I213 ST elevation (STEMI) myocardial infarction of unspecified site: Secondary | ICD-10-CM

## 2018-01-19 NOTE — Progress Notes (Signed)
Daily Session Note  Patient Details  Name: Dale Kennedy MRN: [id number] Date of Birth: October 05, 1975 Referring Provider:     Cardiac Rehab from 12/16/2017 in [facility] Cardiac and Pulmonary Rehab  Referring Provider  [name]      Encounter Date: 01/19/2018  Check In:      Social History   Tobacco Use  Smoking Status Former Smoker  . Packs/day: 1.00  . Types: Cigarettes  . Last attempt to quit: 04/06/2008  . Years since quitting: 9.7  Smokeless Tobacco Never Used    Goals Met:  Proper associated with RPD/PD & O2 Sat Exercise tolerated well No report of cardiac concerns or symptoms Strength training completed today  Goals Unmet:  Not Applicable  Comments:     Dr. Emily Filbert is Medical Director for [place] and [facility].

## 2018-01-20 DIAGNOSIS — K219 Gastro-esophageal reflux disease without esophagitis: Secondary | ICD-10-CM | POA: Diagnosis not present

## 2018-01-20 DIAGNOSIS — I200 Unstable angina: Secondary | ICD-10-CM | POA: Diagnosis not present

## 2018-01-20 DIAGNOSIS — I272 Pulmonary hypertension, unspecified: Secondary | ICD-10-CM | POA: Diagnosis not present

## 2018-01-20 DIAGNOSIS — I509 Heart failure, unspecified: Secondary | ICD-10-CM | POA: Diagnosis not present

## 2018-01-20 DIAGNOSIS — I110 Hypertensive heart disease with heart failure: Secondary | ICD-10-CM | POA: Diagnosis not present

## 2018-01-20 DIAGNOSIS — I25110 Atherosclerotic heart disease of native coronary artery with unstable angina pectoris: Secondary | ICD-10-CM | POA: Diagnosis not present

## 2018-01-20 DIAGNOSIS — Z79899 Other long term (current) drug therapy: Secondary | ICD-10-CM | POA: Diagnosis not present

## 2018-01-20 DIAGNOSIS — Z7982 Long term (current) use of aspirin: Secondary | ICD-10-CM | POA: Diagnosis not present

## 2018-01-20 DIAGNOSIS — E785 Hyperlipidemia, unspecified: Secondary | ICD-10-CM | POA: Diagnosis not present

## 2018-01-20 DIAGNOSIS — Z955 Presence of coronary angioplasty implant and graft: Secondary | ICD-10-CM | POA: Diagnosis not present

## 2018-01-20 DIAGNOSIS — I252 Old myocardial infarction: Secondary | ICD-10-CM | POA: Diagnosis not present

## 2018-01-20 DIAGNOSIS — Z87891 Personal history of nicotine dependence: Secondary | ICD-10-CM | POA: Diagnosis not present

## 2018-01-20 DIAGNOSIS — G709 Myoneural disorder, unspecified: Secondary | ICD-10-CM | POA: Diagnosis not present

## 2018-01-21 ENCOUNTER — Inpatient Hospital Stay: Payer: BLUE CROSS/BLUE SHIELD

## 2018-01-21 ENCOUNTER — Inpatient Hospital Stay: Payer: BLUE CROSS/BLUE SHIELD | Attending: Oncology | Admitting: Oncology

## 2018-01-24 DIAGNOSIS — I213 ST elevation (STEMI) myocardial infarction of unspecified site: Secondary | ICD-10-CM

## 2018-01-24 DIAGNOSIS — G709 Myoneural disorder, unspecified: Secondary | ICD-10-CM | POA: Diagnosis not present

## 2018-01-24 DIAGNOSIS — Z955 Presence of coronary angioplasty implant and graft: Secondary | ICD-10-CM

## 2018-01-24 DIAGNOSIS — Z7982 Long term (current) use of aspirin: Secondary | ICD-10-CM | POA: Diagnosis not present

## 2018-01-24 DIAGNOSIS — Z87891 Personal history of nicotine dependence: Secondary | ICD-10-CM | POA: Diagnosis not present

## 2018-01-24 DIAGNOSIS — I110 Hypertensive heart disease with heart failure: Secondary | ICD-10-CM | POA: Diagnosis not present

## 2018-01-24 DIAGNOSIS — I252 Old myocardial infarction: Secondary | ICD-10-CM | POA: Diagnosis not present

## 2018-01-24 DIAGNOSIS — I272 Pulmonary hypertension, unspecified: Secondary | ICD-10-CM | POA: Diagnosis not present

## 2018-01-24 DIAGNOSIS — I25110 Atherosclerotic heart disease of native coronary artery with unstable angina pectoris: Secondary | ICD-10-CM | POA: Diagnosis not present

## 2018-01-24 DIAGNOSIS — Z79899 Other long term (current) drug therapy: Secondary | ICD-10-CM | POA: Diagnosis not present

## 2018-01-24 DIAGNOSIS — I200 Unstable angina: Secondary | ICD-10-CM | POA: Diagnosis not present

## 2018-01-24 DIAGNOSIS — E785 Hyperlipidemia, unspecified: Secondary | ICD-10-CM | POA: Diagnosis not present

## 2018-01-24 DIAGNOSIS — K219 Gastro-esophageal reflux disease without esophagitis: Secondary | ICD-10-CM | POA: Diagnosis not present

## 2018-01-24 DIAGNOSIS — I509 Heart failure, unspecified: Secondary | ICD-10-CM | POA: Diagnosis not present

## 2018-01-24 NOTE — Progress Notes (Signed)
Daily Session Note  Patient Details  Name: Dale Kennedy MRN: [id number] Date of Birth: [birth year] Referring Provider:     Cardiac Rehab from 12/16/2017 in [facility] Cardiac and Pulmonary Rehab  Referring Provider  [name]      Encounter Date: 01/24/2018  Check In: Session Check In - 01/24/18 1704      Check-In   Supervising physician immediately available to respond to emergencies  See telemetry face sheet for immediately available ER MD    Location  [facility]-Cardiac & Pulmonary Rehab    Staff Present  Nada Maclachlan, BA, ACSM CEP, Exercise Physiologist;Carroll Enterkin, RN, Moises Blood, BS, ACSM CEP, Exercise Physiologist    Medication changes reported      No    Fall or balance concerns reported     No    Warm-up and Cool-down  Performed on first and last piece of equipment    Resistance Training Performed  Yes    VAD Patient?  No    PAD/SET Patient?  No      Pain Assessment   Currently in Pain?  No/denies    Multiple Pain Sites  No          Social History   Tobacco Use  Smoking Status Former Smoker  . Packs/day: 1.00  . Types: Cigarettes  . Last attempt to quit: 04/06/2008  . Years since quitting: 9.8  Smokeless Tobacco Never Used    Goals Met:  Independence with exercise equipment Exercise tolerated well No report of cardiac concerns or symptoms Strength training completed today  Goals Unmet:  Not Applicable  Comments: Pt able to follow exercise prescription today without complaint.  Will continue to monitor for progression.    Dr. Emily Filbert is Medical Director for [place] and [facility].

## 2018-01-26 ENCOUNTER — Encounter: Payer: BLUE CROSS/BLUE SHIELD | Admitting: *Deleted

## 2018-01-26 DIAGNOSIS — I509 Heart failure, unspecified: Secondary | ICD-10-CM | POA: Diagnosis not present

## 2018-01-26 DIAGNOSIS — Z955 Presence of coronary angioplasty implant and graft: Secondary | ICD-10-CM | POA: Diagnosis not present

## 2018-01-26 DIAGNOSIS — G709 Myoneural disorder, unspecified: Secondary | ICD-10-CM | POA: Diagnosis not present

## 2018-01-26 DIAGNOSIS — I110 Hypertensive heart disease with heart failure: Secondary | ICD-10-CM | POA: Diagnosis not present

## 2018-01-26 DIAGNOSIS — Z79899 Other long term (current) drug therapy: Secondary | ICD-10-CM | POA: Diagnosis not present

## 2018-01-26 DIAGNOSIS — I200 Unstable angina: Secondary | ICD-10-CM | POA: Diagnosis not present

## 2018-01-26 DIAGNOSIS — Z87891 Personal history of nicotine dependence: Secondary | ICD-10-CM | POA: Diagnosis not present

## 2018-01-26 DIAGNOSIS — I213 ST elevation (STEMI) myocardial infarction of unspecified site: Secondary | ICD-10-CM

## 2018-01-26 DIAGNOSIS — I25110 Atherosclerotic heart disease of native coronary artery with unstable angina pectoris: Secondary | ICD-10-CM | POA: Diagnosis not present

## 2018-01-26 DIAGNOSIS — E785 Hyperlipidemia, unspecified: Secondary | ICD-10-CM | POA: Diagnosis not present

## 2018-01-26 DIAGNOSIS — K219 Gastro-esophageal reflux disease without esophagitis: Secondary | ICD-10-CM | POA: Diagnosis not present

## 2018-01-26 DIAGNOSIS — Z7982 Long term (current) use of aspirin: Secondary | ICD-10-CM | POA: Diagnosis not present

## 2018-01-26 DIAGNOSIS — I272 Pulmonary hypertension, unspecified: Secondary | ICD-10-CM | POA: Diagnosis not present

## 2018-01-26 DIAGNOSIS — I252 Old myocardial infarction: Secondary | ICD-10-CM | POA: Diagnosis not present

## 2018-01-26 NOTE — Progress Notes (Signed)
Daily Session Note  Patient Details  Name: Dale Kennedy MRN: [id number] Date of Birth: November 22, 1975 Referring Provider:     Cardiac Rehab from 12/16/2017 in [facility] Cardiac and Pulmonary Rehab  Referring Provider  [name]      Encounter Date: 01/26/2018  Check In:      Social History   Tobacco Use  Smoking Status Former Smoker  . Packs/day: 1.00  . Types: Cigarettes  . Last attempt to quit: 04/06/2008  . Years since quitting: 9.8  Smokeless Tobacco Never Used    Goals Met:  Proper associated with RPD/PD & O2 Sat Exercise tolerated well No report of cardiac concerns or symptoms Strength training completed today  Goals Unmet:  Not Applicable  Comments:     Dr. Emily Filbert is Medical Director for [place] and [facility].

## 2018-01-27 DIAGNOSIS — I25110 Atherosclerotic heart disease of native coronary artery with unstable angina pectoris: Secondary | ICD-10-CM | POA: Diagnosis not present

## 2018-01-27 DIAGNOSIS — I509 Heart failure, unspecified: Secondary | ICD-10-CM | POA: Diagnosis not present

## 2018-01-27 DIAGNOSIS — Z7982 Long term (current) use of aspirin: Secondary | ICD-10-CM | POA: Diagnosis not present

## 2018-01-27 DIAGNOSIS — I200 Unstable angina: Secondary | ICD-10-CM | POA: Diagnosis not present

## 2018-01-27 DIAGNOSIS — Z79899 Other long term (current) drug therapy: Secondary | ICD-10-CM | POA: Diagnosis not present

## 2018-01-27 DIAGNOSIS — I110 Hypertensive heart disease with heart failure: Secondary | ICD-10-CM | POA: Diagnosis not present

## 2018-01-27 DIAGNOSIS — Z87891 Personal history of nicotine dependence: Secondary | ICD-10-CM | POA: Diagnosis not present

## 2018-01-27 DIAGNOSIS — I272 Pulmonary hypertension, unspecified: Secondary | ICD-10-CM | POA: Diagnosis not present

## 2018-01-27 DIAGNOSIS — G709 Myoneural disorder, unspecified: Secondary | ICD-10-CM | POA: Diagnosis not present

## 2018-01-27 DIAGNOSIS — I252 Old myocardial infarction: Secondary | ICD-10-CM | POA: Diagnosis not present

## 2018-01-27 DIAGNOSIS — E785 Hyperlipidemia, unspecified: Secondary | ICD-10-CM | POA: Diagnosis not present

## 2018-01-27 DIAGNOSIS — Z955 Presence of coronary angioplasty implant and graft: Secondary | ICD-10-CM | POA: Diagnosis not present

## 2018-01-27 DIAGNOSIS — K219 Gastro-esophageal reflux disease without esophagitis: Secondary | ICD-10-CM | POA: Diagnosis not present

## 2018-01-27 NOTE — Progress Notes (Signed)
Daily Session Note  Patient Details  Name: Dale Kennedy MRN: [id number] Date of Birth: 1976-01-16 Referring Provider:     Cardiac Rehab from 12/16/2017 in [facility] Cardiac and Pulmonary Rehab  Referring Provider  [name]      Encounter Date: 01/27/2018  Check In: Session Check In - 01/27/18 [place]      Check-In   Supervising physician immediately available to respond to emergencies  See telemetry face sheet for immediately available ER MD    Location  [facility]-Cardiac & Pulmonary Rehab    Staff Present  Renita Papa, RN BSN;Jedrek Dinovo RCP,RRT,BSRT;Amanda [place], [place], ACSM CEP, Exercise Physiologist    Medication changes reported      No    Fall or balance concerns reported     No    Warm-up and Cool-down  Performed on first and last piece of equipment    Resistance Training Performed  Yes    VAD Patient?  No    PAD/SET Patient?  No      Pain Assessment   Currently in Pain?  No/denies          Social History   Tobacco Use  Smoking Status Former Smoker  . Packs/day: 1.00  . Types: Cigarettes  . Last attempt to quit: 04/06/2008  . Years since quitting: 9.8  Smokeless Tobacco Never Used    Goals Met:  Independence with exercise equipment Exercise tolerated well No report of cardiac concerns or symptoms Strength training completed today  Goals Unmet:  Not Applicable  Comments: Pt able to follow exercise prescription today without complaint.  Will continue to monitor for progression.    Dr. Emily Filbert is Medical Director for [place] and [facility].

## 2018-01-31 ENCOUNTER — Encounter: Admit: 2018-01-31 | Discharge: 2018-02-01 | Payer: BLUE CROSS/BLUE SHIELD | Attending: Clinical | Primary: Clinical

## 2018-01-31 VITALS — Ht 72.0 in | Wt 239.0 lb

## 2018-01-31 DIAGNOSIS — F4323 Adjustment disorder with mixed anxiety and depressed mood: Principal | ICD-10-CM

## 2018-01-31 DIAGNOSIS — I509 Heart failure, unspecified: Secondary | ICD-10-CM | POA: Diagnosis not present

## 2018-01-31 DIAGNOSIS — K219 Gastro-esophageal reflux disease without esophagitis: Secondary | ICD-10-CM | POA: Diagnosis not present

## 2018-01-31 DIAGNOSIS — I272 Pulmonary hypertension, unspecified: Secondary | ICD-10-CM | POA: Diagnosis not present

## 2018-01-31 DIAGNOSIS — G709 Myoneural disorder, unspecified: Secondary | ICD-10-CM | POA: Diagnosis not present

## 2018-01-31 DIAGNOSIS — I110 Hypertensive heart disease with heart failure: Secondary | ICD-10-CM | POA: Diagnosis not present

## 2018-01-31 DIAGNOSIS — I200 Unstable angina: Secondary | ICD-10-CM | POA: Diagnosis not present

## 2018-01-31 DIAGNOSIS — Z79899 Other long term (current) drug therapy: Secondary | ICD-10-CM | POA: Diagnosis not present

## 2018-01-31 DIAGNOSIS — Z955 Presence of coronary angioplasty implant and graft: Secondary | ICD-10-CM

## 2018-01-31 DIAGNOSIS — I25110 Atherosclerotic heart disease of native coronary artery with unstable angina pectoris: Secondary | ICD-10-CM | POA: Diagnosis not present

## 2018-01-31 DIAGNOSIS — I213 ST elevation (STEMI) myocardial infarction of unspecified site: Secondary | ICD-10-CM

## 2018-01-31 DIAGNOSIS — Z7982 Long term (current) use of aspirin: Secondary | ICD-10-CM | POA: Diagnosis not present

## 2018-01-31 DIAGNOSIS — Z87891 Personal history of nicotine dependence: Secondary | ICD-10-CM | POA: Diagnosis not present

## 2018-01-31 DIAGNOSIS — I252 Old myocardial infarction: Secondary | ICD-10-CM | POA: Diagnosis not present

## 2018-01-31 DIAGNOSIS — E785 Hyperlipidemia, unspecified: Secondary | ICD-10-CM | POA: Diagnosis not present

## 2018-01-31 NOTE — Progress Notes (Signed)
Daily Session Note  Patient Details  Name: Dale Kennedy MRN: [id number] Date of Birth: 11-04-1975 Referring Provider:     Cardiac Rehab from 12/16/2017 in [facility] Cardiac and Pulmonary Rehab  Referring Provider  [name]      Encounter Date: 01/31/2018  Check In: Session Check In - 01/31/18 [place]      Check-In   Supervising physician immediately available to respond to emergencies  See telemetry face sheet for immediately available ER MD    Location  [facility]-Cardiac & Pulmonary Rehab    Staff Present  Nada Maclachlan, BA, ACSM CEP, Exercise Physiologist;Carroll Enterkin, RN, Moises Blood, BS, ACSM CEP, Exercise Physiologist    Medication changes reported      No    Fall or balance concerns reported     No    Warm-up and Cool-down  Performed on first and last piece of equipment    Resistance Training Performed  Yes    VAD Patient?  No    PAD/SET Patient?  No      Pain Assessment   Currently in Pain?  No/denies    Multiple Pain Sites  No          Social History   Tobacco Use  Smoking Status Former Smoker  . Packs/day: 1.00  . Types: Cigarettes  . Last attempt to quit: 04/06/2008  . Years since quitting: 9.8  Smokeless Tobacco Never Used    Goals Met:  Independence with exercise equipment Exercise tolerated well No report of cardiac concerns or symptoms Strength training completed today  Goals Unmet:  Not Applicable  Comments:  6 Minute Walk    Row Name 12/16/17 1432         6 Minute Walk   Phase  Initial     Distance  765 feet     Walk Time  6 minutes     # of Rest Breaks  0     MPH  1.45     METS  3.43     RPE  12     Perceived Dyspnea   2     VO2 Peak  12.02     Symptoms  No     Resting HR  81 bpm     Resting BP  136/82     Resting Oxygen Saturation   97 %     Exercise Oxygen Saturation  during 6 min walk  97 %     Max Ex. HR  93 bpm     Max Ex. BP  156/88     2 Minute Post BP  128/78          Dr. Emily Filbert is Medical Director for [place] and [facility].

## 2018-02-02 ENCOUNTER — Other Ambulatory Visit: Payer: Self-pay

## 2018-02-02 ENCOUNTER — Emergency Department
Admission: EM | Admit: 2018-02-02 | Discharge: 2018-02-02 | Disposition: A | Discharge status: Home / Self Care | Payer: BLUE CROSS/BLUE SHIELD | Attending: Emergency Medicine | Admitting: Emergency Medicine

## 2018-02-02 ENCOUNTER — Emergency Department: Payer: BLUE CROSS/BLUE SHIELD

## 2018-02-02 ENCOUNTER — Encounter: Payer: Self-pay | Admitting: Emergency Medicine

## 2018-02-02 ENCOUNTER — Encounter: Payer: BLUE CROSS/BLUE SHIELD | Admitting: *Deleted

## 2018-02-02 DIAGNOSIS — I509 Heart failure, unspecified: Secondary | ICD-10-CM | POA: Diagnosis not present

## 2018-02-02 DIAGNOSIS — R079 Chest pain, unspecified: Secondary | ICD-10-CM | POA: Diagnosis not present

## 2018-02-02 DIAGNOSIS — I213 ST elevation (STEMI) myocardial infarction of unspecified site: Secondary | ICD-10-CM

## 2018-02-02 DIAGNOSIS — Z79899 Other long term (current) drug therapy: Secondary | ICD-10-CM | POA: Insufficient documentation

## 2018-02-02 DIAGNOSIS — R0602 Shortness of breath: Secondary | ICD-10-CM | POA: Insufficient documentation

## 2018-02-02 DIAGNOSIS — J45909 Unspecified asthma, uncomplicated: Secondary | ICD-10-CM | POA: Diagnosis not present

## 2018-02-02 DIAGNOSIS — R05 Cough: Secondary | ICD-10-CM | POA: Diagnosis not present

## 2018-02-02 DIAGNOSIS — Z87891 Personal history of nicotine dependence: Secondary | ICD-10-CM | POA: Insufficient documentation

## 2018-02-02 DIAGNOSIS — Z7982 Long term (current) use of aspirin: Secondary | ICD-10-CM | POA: Insufficient documentation

## 2018-02-02 DIAGNOSIS — Z7901 Long term (current) use of anticoagulants: Secondary | ICD-10-CM | POA: Diagnosis not present

## 2018-02-02 DIAGNOSIS — Z955 Presence of coronary angioplasty implant and graft: Secondary | ICD-10-CM

## 2018-02-02 DIAGNOSIS — I2510 Atherosclerotic heart disease of native coronary artery without angina pectoris: Secondary | ICD-10-CM | POA: Insufficient documentation

## 2018-02-02 LAB — BASIC METABOLIC PANEL
Anion gap: 9 (ref 5–15)
BUN: 13 mg/dL (ref 6–20)
CHLORIDE: 103 mmol/L (ref 98–111)
CO2: 26 mmol/L (ref 22–32)
Calcium: 9 mg/dL (ref 8.9–10.3)
Creatinine, Ser: 1.06 mg/dL (ref 0.61–1.24)
GFR calc Af Amer: >60 mL/min (ref >60)
Glucose, Bld: 97 mg/dL (ref 70–99)
POTASSIUM: 4.1 mmol/L (ref 3.5–5.1)
SODIUM: 138 mmol/L (ref 135–145)

## 2018-02-02 LAB — CBC
HEMATOCRIT: 43.1 % (ref 39.0–52.0)
Hemoglobin: 12.9 g/dL — ABNORMAL LOW (ref 13.0–17.0)
MCH: 22.7 pg — ABNORMAL LOW (ref 26.0–34.0)
MCHC: 29.9 g/dL — ABNORMAL LOW (ref 30.0–36.0)
MCV: 75.9 fL — ABNORMAL LOW (ref 80.0–100.0)
Platelets: 420 10*3/uL — ABNORMAL HIGH (ref 150–400)
RBC: 5.68 MIL/uL (ref 4.22–5.81)
RDW: 16.6 % — AB (ref 11.5–15.5)
WBC: 5.1 10*3/uL (ref 4.0–10.5)
nRBC: 0 % (ref 0.0–0.2)

## 2018-02-02 LAB — INFLUENZA PANEL BY PCR (TYPE A & B)
Influenza A By PCR: NEGATIVE
Influenza B By PCR: NEGATIVE

## 2018-02-02 LAB — FIBRIN DERIVATIVES D-DIMER (ARMC ONLY): FIBRIN DERIVATIVES D-DIMER (ARMC): 477.42 ng{FEU}/mL (ref 0.00–499.00)

## 2018-02-02 LAB — TROPONIN I: Troponin I: <0.03 ng/mL (ref <0.03)

## 2018-02-02 LAB — CK: CK TOTAL: 349 U/L (ref 49–397)

## 2018-02-02 NOTE — Progress Notes (Signed)
Incomplete Session Note  Patient Details  Name: Dale Kennedy MRN: [id number] Date of Birth: [birth year] Referring Provider:     Cardiac Rehab from 12/16/2017 in [facility] Cardiac and Pulmonary Rehab  Referring Provider  [name] did not complete his rehab session.  [name] reported not feeling well. His initial blood pressure was 220/120. After sitting for 5 minutes pressure was 188/90. He reported taking his medications as he should early this morning but ever since taking his medicine he has not felt well and has had indigestion. [name] was taken to the ED by staff to be evaluated.

## 2018-02-02 NOTE — ED Triage Notes (Addendum)
Patient reports burning central chest pain that started about 10 this morning. Reports history of MI with stent placement 2 months ago. Patient denies SOB, dizziness. Reports cough x2 weeks.
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
## 2018-02-02 NOTE — Discharge Instructions (Signed)
Your test here today are negative.  There is no sign of heart injury or blood clots or anything else.  However your history is somewhat worrisome.  I would give your cardiologist to call and see if they can see you sometime later this week.  Otherwise you can try Dr. Percival Spanish, let him know you were in the emergency room with chest pain.  He should be out of see you before the end of the week.  Please return here if you are worse.

## 2018-02-03 DIAGNOSIS — G709 Myoneural disorder, unspecified: Secondary | ICD-10-CM | POA: Diagnosis not present

## 2018-02-03 DIAGNOSIS — I110 Hypertensive heart disease with heart failure: Secondary | ICD-10-CM | POA: Diagnosis not present

## 2018-02-03 DIAGNOSIS — Z7982 Long term (current) use of aspirin: Secondary | ICD-10-CM | POA: Diagnosis not present

## 2018-02-03 DIAGNOSIS — E785 Hyperlipidemia, unspecified: Secondary | ICD-10-CM | POA: Diagnosis not present

## 2018-02-03 DIAGNOSIS — I509 Heart failure, unspecified: Secondary | ICD-10-CM | POA: Diagnosis not present

## 2018-02-03 DIAGNOSIS — K219 Gastro-esophageal reflux disease without esophagitis: Secondary | ICD-10-CM | POA: Diagnosis not present

## 2018-02-03 DIAGNOSIS — Z955 Presence of coronary angioplasty implant and graft: Secondary | ICD-10-CM | POA: Diagnosis not present

## 2018-02-03 DIAGNOSIS — Z79899 Other long term (current) drug therapy: Secondary | ICD-10-CM | POA: Diagnosis not present

## 2018-02-03 DIAGNOSIS — I25110 Atherosclerotic heart disease of native coronary artery with unstable angina pectoris: Secondary | ICD-10-CM | POA: Diagnosis not present

## 2018-02-03 DIAGNOSIS — I252 Old myocardial infarction: Secondary | ICD-10-CM | POA: Diagnosis not present

## 2018-02-03 DIAGNOSIS — I200 Unstable angina: Secondary | ICD-10-CM | POA: Diagnosis not present

## 2018-02-03 DIAGNOSIS — I272 Pulmonary hypertension, unspecified: Secondary | ICD-10-CM | POA: Diagnosis not present

## 2018-02-03 DIAGNOSIS — Z87891 Personal history of nicotine dependence: Secondary | ICD-10-CM | POA: Diagnosis not present

## 2018-02-03 NOTE — Progress Notes (Signed)
Daily Session Note  Patient Details  Name: Dale Kennedy MRN: [id number] Date of Birth: 1975-06-19 Referring Provider:     Cardiac Rehab from 12/16/2017 in [facility] Cardiac and Pulmonary Rehab  Referring Provider  [name]      Encounter Date: 02/03/2018  Check In: Session Check In - 02/03/18 1611      Check-In   Supervising physician immediately available to respond to emergencies  See telemetry face sheet for immediately available ER MD    Location  [facility]-Cardiac & Pulmonary Rehab    Staff Present  Justin Mend RCP,RRT,BSRT;Meredith Sherryll Burger, RN Moises Blood, BS, ACSM CEP, Exercise Physiologist    Medication changes reported      No    Fall or balance concerns reported     No    Warm-up and Cool-down  Performed on first and last piece of equipment    Resistance Training Performed  Yes    VAD Patient?  No    PAD/SET Patient?  No      Pain Assessment   Currently in Pain?  No/denies          Social History   Tobacco Use  Smoking Status Former Smoker  . Packs/day: 1.00  . Types: Cigarettes  . Last attempt to quit: 04/06/2008  . Years since quitting: 9.8  Smokeless Tobacco Never Used    Goals Met:  Independence with exercise equipment Exercise tolerated well No report of cardiac concerns or symptoms Strength training completed today  Goals Unmet:  Not Applicable  Comments: Pt able to follow exercise prescription today without complaint.  Will continue to monitor for progression.    Dr. Emily Filbert is Medical Director for [name] and [facility].

## 2018-02-04 DIAGNOSIS — G4733 Obstructive sleep apnea (adult) (pediatric): Secondary | ICD-10-CM | POA: Diagnosis not present

## 2018-02-07 ENCOUNTER — Encounter: Payer: BLUE CROSS/BLUE SHIELD | Attending: Family Medicine | Admitting: *Deleted

## 2018-02-07 DIAGNOSIS — I25110 Atherosclerotic heart disease of native coronary artery with unstable angina pectoris: Secondary | ICD-10-CM | POA: Insufficient documentation

## 2018-02-07 DIAGNOSIS — E785 Hyperlipidemia, unspecified: Secondary | ICD-10-CM | POA: Diagnosis not present

## 2018-02-07 DIAGNOSIS — I110 Hypertensive heart disease with heart failure: Secondary | ICD-10-CM | POA: Diagnosis not present

## 2018-02-07 DIAGNOSIS — I272 Pulmonary hypertension, unspecified: Secondary | ICD-10-CM | POA: Insufficient documentation

## 2018-02-07 DIAGNOSIS — K219 Gastro-esophageal reflux disease without esophagitis: Secondary | ICD-10-CM | POA: Diagnosis not present

## 2018-02-07 DIAGNOSIS — Z955 Presence of coronary angioplasty implant and graft: Secondary | ICD-10-CM | POA: Diagnosis not present

## 2018-02-07 DIAGNOSIS — Z7982 Long term (current) use of aspirin: Secondary | ICD-10-CM | POA: Insufficient documentation

## 2018-02-07 DIAGNOSIS — Z87891 Personal history of nicotine dependence: Secondary | ICD-10-CM | POA: Diagnosis not present

## 2018-02-07 DIAGNOSIS — I252 Old myocardial infarction: Secondary | ICD-10-CM | POA: Insufficient documentation

## 2018-02-07 DIAGNOSIS — G709 Myoneural disorder, unspecified: Secondary | ICD-10-CM | POA: Insufficient documentation

## 2018-02-07 DIAGNOSIS — I509 Heart failure, unspecified: Secondary | ICD-10-CM | POA: Diagnosis not present

## 2018-02-07 DIAGNOSIS — I200 Unstable angina: Secondary | ICD-10-CM | POA: Diagnosis not present

## 2018-02-07 DIAGNOSIS — I213 ST elevation (STEMI) myocardial infarction of unspecified site: Secondary | ICD-10-CM

## 2018-02-07 DIAGNOSIS — Z79899 Other long term (current) drug therapy: Secondary | ICD-10-CM | POA: Insufficient documentation

## 2018-02-09 ENCOUNTER — Encounter: Payer: Self-pay | Admitting: *Deleted

## 2018-02-09 DIAGNOSIS — I213 ST elevation (STEMI) myocardial infarction of unspecified site: Secondary | ICD-10-CM

## 2018-02-09 DIAGNOSIS — Z955 Presence of coronary angioplasty implant and graft: Secondary | ICD-10-CM

## 2018-02-09 NOTE — Progress Notes (Signed)
Daily Session Note  Patient Details  Name: Dale Kennedy MRN: [id number] Date of Birth: Jul 10, 1975 Referring Provider:     Cardiac Rehab from 12/16/2017 in [facility] Cardiac and Pulmonary Rehab  Referring Provider  [name]      Encounter Date: 02/07/2018  Check In:      Social History   Tobacco Use  Smoking Status Former Smoker  . Packs/day: 1.00  . Types: Cigarettes  . Last attempt to quit: 04/06/2008  . Years since quitting: 9.8  Smokeless Tobacco Never Used    Goals Met:  Independence with exercise equipment Exercise tolerated well No report of cardiac concerns or symptoms Strength training completed today  Goals Unmet:  Not Applicable  Comments: Pt able to follow exercise prescription today without complaint.  Will continue to monitor for progression.    Dr. Emily Filbert is Medical Director for [place] and [facility].
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
## 2018-02-10 NOTE — Progress Notes (Signed)
Discharge Progress Report  Patient Details  Name: Dale Kennedy MRN: [id number] Date of Birth: 07-25-1975 Referring Provider:     Cardiac Rehab from 12/16/2017 in [facility]  Referring Provider  [place]       Number of Visits: 60  Reason for Discharge:  Patient reached a stable level of exercise. Patient independent in their exercise. Patient has met program and personal goals.  Smoking History:  Social History   Tobacco Use  Smoking Status Former Smoker  . Packs/day: 1.00  . Types: Cigarettes  . Last attempt to quit: 04/06/2008  . Years since quitting: 9.8  Smokeless Tobacco Never Used    Diagnosis:  Status post coronary artery stent placement  ST elevation myocardial infarction (STEMI), unspecified artery (HCC)  ADL [facility]:   Initial Exercise Prescription: Initial Exercise Prescription - 12/16/17 1400      Date of Initial Exercise RX and Referring Provider   Date  12/16/17    Referring Provider  [name]      Treadmill   MPH  1.4    Grade  1    Minutes  15    METs  2.26      Recumbant Bike   Level  4    RPM  60    Watts  48    Minutes  15    METs  3.4      NuStep   Level  3    SPM  80    Minutes  15    METs  3.4      Prescription Details   Frequency (times per week)  3    Duration  Progress to 45 minutes of aerobic exercise without signs/symptoms of physical distress      Intensity   THRR 40-80% of Max Heartrate  120-159    Ratings of Perceived Exertion  11-13    Perceived Dyspnea  0-4      Resistance Training   Training Prescription  Yes    Weight  4 lb    Reps  10-15       Discharge Exercise Prescription (Final Exercise Prescription Changes): Exercise Prescription Changes - 02/01/18 1500      Response to Exercise   Blood Pressure (Admit)  138/86    Blood Pressure (Exercise)  164/86    Blood Pressure (Exit)  130/82    Heart Rate (Admit)  83 bpm    Heart Rate (Exercise)  109 bpm    Heart Rate (Exit)  79 bpm    Rating  of Perceived Exertion (Exercise)  13    Perceived Dyspnea (Exercise)  2    Symptoms  none    Duration  Continue with 45 min of aerobic exercise without signs/symptoms of physical distress.    Intensity  THRR unchanged      Progression   Progression  Continue to progress workloads to maintain intensity without signs/symptoms of physical distress.    Average METs  3.5      Resistance Training   Training Prescription  Yes    Weight  7 lb    Reps  10-15      Interval Training   Interval Training  No      Treadmill   MPH  2.8    Grade  2    Minutes  15    METs  3.92      Recumbant Bike   Level  6    RPM  60    Watts  40    Minutes  15    METs  3.14       Functional Capacity: 6 Minute Walk    Row Name 12/16/17 1432 01/31/18 1700       6 Minute Walk   Phase  Initial  Discharge    Distance  765 feet  1480 feet    Distance % Change  -  93.5 %    Distance Feet Change  -  715 ft    Walk Time  6 minutes  6 minutes    # of Rest Breaks  0  0    MPH  1.45  2.8    METS  3.43  4.9    RPE  12  13    Perceived Dyspnea   2  2    VO2 Peak  12.02  17.14    Symptoms  No  No    Resting HR  81 bpm  83 bpm    Resting BP  136/82  138/86    Resting Oxygen Saturation   97 %  96 %    Exercise Oxygen Saturation  during 6 min walk  97 %  97 %    Max Ex. HR  93 bpm  104 bpm    Max Ex. BP  156/88  164/86    2 Minute Post BP  128/78  -       Psychological, QOL, Others - Outcomes: PHQ 2/9: Depression screen [facility] 2/9 12/16/2017 12/15/2017 10/19/2017 07/21/2017 05/28/2017  Decreased Interest 0 0 0 0 0  Down, Depressed, Hopeless 0 0 0 0 0  PHQ - 2 Score 0 0 0 0 0  Altered sleeping 2 - 0 - -  Tired, decreased energy 1 - 0 - -  Change in appetite 2 - 0 - -  Feeling bad or failure about yourself  0 - 0 - -  Trouble concentrating 0 - 0 - -  Moving slowly or fidgety/restless 0 - 0 - -  Suicidal thoughts 0 - 0 - -  PHQ-9 Score 5 - 0 - -  Difficult doing work/chores Not difficult at all - Not  difficult at all - -    Quality of Life: Quality of Life - 12/16/17 1437      Quality of Life   Select  Quality of Life      Quality of Life Scores   Health/Function Pre  17.2 %    Socioeconomic Pre  27.75 %    Psych/Spiritual Pre  28.29 %    Family Pre  30 %    GLOBAL Pre  23.66 %       Personal Goals: Goals established at orientation with interventions provided to work toward goal. Personal Goals and Risk Factors at Admission - 12/16/17 1441      Core Components/Risk Factors/Patient Goals on Admission    Weight Management  Yes;Obesity    Intervention  Weight Management: Develop a combined nutrition and exercise program designed to reach desired caloric intake, while maintaining appropriate intake of nutrient and fiber, sodium and fats, and appropriate energy expenditure required for the weight goal.;Weight Management: Provide education and appropriate resources to help participant work on and attain dietary goals.;Weight Management/Obesity: Establish reasonable short term and long term weight goals.;Obesity: Provide education and appropriate resources to help participant work on and attain dietary goals.    Admit Weight  235 lb 3.2 oz (106.7 kg)    Goal Weight: Short Term  225 lb (102.1 kg)  Goal Weight: Long Term  200 lb (90.7 kg)    Expected Outcomes  Short Term: Continue to assess and modify interventions until short term weight is achieved;Long Term: Adherence to nutrition and physical activity/exercise program aimed toward attainment of established weight goal;Weight Maintenance: Understanding of the daily nutrition guidelines, which includes 25-35% calories from fat, 7% or less cal from saturated fats, less than '200mg'$  cholesterol, less than 1.5gm of sodium, & 5 or more servings of fruits and vegetables daily;Weight Loss: Understanding of general recommendations for a balanced deficit meal plan, which promotes 1-2 lb weight loss per week and includes a negative energy balance of  [phone] kcal/d;Understanding recommendations for meals to include 15-35% energy as protein, 25-35% energy from fat, 35-60% energy from carbohydrates, less than '200mg'$  of dietary cholesterol, 20-35 gm of total fiber daily;Understanding of distribution of calorie intake throughout the day with the consumption of 4-5 meals/snacks;Weight Gain: Understanding of general recommendations for a high calorie, high protein meal plan that promotes weight gain by distributing calorie intake throughout the day with the consumption for 4-5 meals, snacks, and/or supplements    Improve shortness of breath with ADL's  Yes    Intervention  Provide education, individualized exercise plan and daily activity instruction to help decrease symptoms of SOB with activities of daily living.    Expected Outcomes  Short Term: Improve cardiorespiratory fitness to achieve a reduction of symptoms when performing ADLs;Long Term: Be able to perform more ADLs without symptoms or delay the onset of symptoms    Hypertension  Yes    Intervention  Provide education on lifestyle modifcations including regular physical activity/exercise, weight management, moderate sodium restriction and increased consumption of fresh fruit, vegetables, and low fat dairy, alcohol moderation, and smoking cessation.;Monitor prescription use compliance.    Expected Outcomes  Short Term: Continued assessment and intervention until BP is < 140/42m HG in hypertensive participants. < 130/854mHG in hypertensive participants with diabetes, heart failure or chronic kidney disease.;Long Term: Maintenance of blood pressure at goal levels.    Lipids  Yes    Intervention  Provide education and support for participant on nutrition & aerobic/resistive exercise along with prescribed medications to achieve LDL '70mg'$ , HDL >'40mg'$ .    Expected Outcomes  Short Term: Participant states understanding of desired cholesterol values and is compliant with medications prescribed. Participant  is following exercise prescription and nutrition guidelines.;Long Term: Cholesterol controlled with medications as prescribed, with individualized exercise RX and with personalized nutrition plan. Value goals: LDL < '70mg'$ , HDL > 40 mg.    Stress  Yes    Intervention  Offer individual and/or small group education and counseling on adjustment to heart disease, stress management and health-related lifestyle change. Teach and support self-help strategies.;Refer participants experiencing significant psychosocial distress to appropriate mental health specialists for further evaluation and treatment. When possible, include family members and significant others in education/counseling sessions.    Expected Outcomes  Short Term: Participant demonstrates changes in health-related behavior, relaxation and other stress management skills, ability to obtain effective social support, and compliance with psychotropic medications if prescribed.;Long Term: Emotional wellbeing is indicated by absence of clinically significant psychosocial distress or social isolation.        Personal Goals Discharge:   Exercise Goals and Review: Exercise Goals    Row Name 12/16/17 1430             Exercise Goals   Increase Physical Activity  Yes       Intervention  Provide advice, education, support  and counseling about physical activity/exercise needs.;Develop an individualized exercise prescription for aerobic and resistive training based on initial evaluation findings, risk stratification, comorbidities and participant's personal goals.       Expected Outcomes  Short Term: Attend rehab on a regular basis to increase amount of physical activity.;Long Term: Add in home exercise to make exercise part of routine and to increase amount of physical activity.;Long Term: Exercising regularly at least 3-5 days a week.       Increase Strength and Stamina  Yes       Intervention  Provide advice, education, support and counseling about  physical activity/exercise needs.;Develop an individualized exercise prescription for aerobic and resistive training based on initial evaluation findings, risk stratification, comorbidities and participant's personal goals.       Expected Outcomes  Short Term: Increase workloads from initial exercise prescription for resistance, speed, and METs.;Short Term: Perform resistance training exercises routinely during rehab and add in resistance training at home;Long Term: Improve cardiorespiratory fitness, muscular endurance and strength as measured by increased METs and functional capacity (6MWT)       Able to understand and use rate of perceived exertion (RPE) scale  Yes       Intervention  Provide education and explanation on how to use RPE scale       Expected Outcomes  Short Term: Able to use RPE daily in rehab to express subjective intensity level;Long Term:  Able to use RPE to guide intensity level when exercising independently       Able to understand and use Dyspnea scale  Yes       Intervention  Provide education and explanation on how to use Dyspnea scale       Expected Outcomes  Short Term: Able to use Dyspnea scale daily in rehab to express subjective sense of shortness of breath during exertion;Long Term: Able to use Dyspnea scale to guide intensity level when exercising independently       Knowledge and understanding of Target Heart Rate Range (THRR)  Yes       Intervention  Provide education and explanation of THRR including how the numbers were predicted and where they are located for reference       Expected Outcomes  Short Term: Able to state/look up THRR;Short Term: Able to use daily as guideline for intensity in rehab       Able to check pulse independently  Yes       Intervention  Provide education and demonstration on how to check pulse in carotid and radial arteries.;Review the importance of being able to check your own pulse for safety during independent exercise       Expected  Outcomes  Short Term: Able to explain why pulse checking is important during independent exercise;Long Term: Able to check pulse independently and accurately       Understanding of Exercise Prescription  Yes       Intervention  Provide education, explanation, and written materials on patient's individual exercise prescription       Expected Outcomes  Short Term: Able to explain program exercise prescription;Long Term: Able to explain home exercise prescription to exercise independently          Exercise Goals Re-Evaluation: Exercise Goals Re-Evaluation    Row Name 12/20/17 1742 01/05/18 1205 01/06/18 1634 01/19/18 1111 02/01/18 1526     Exercise Goal Re-Evaluation   Exercise Goals Review  Increase Physical Activity;Able to understand and use rate of perceived exertion (RPE) scale;Knowledge and understanding of  Target Heart Rate Range (THRR);Understanding of Exercise Prescription;Increase Strength and Stamina  Increase Physical Activity;Increase Strength and Stamina;Able to understand and use rate of perceived exertion (RPE) scale;Knowledge and understanding of Target Heart Rate Range (THRR);Understanding of Exercise Prescription  Increase Physical Activity;Increase Strength and Stamina;Able to understand and use rate of perceived exertion (RPE) scale;Knowledge and understanding of Target Heart Rate Range (THRR);Understanding of Exercise Prescription  Increase Physical Activity;Increase Strength and Stamina;Able to understand and use rate of perceived exertion (RPE) scale;Knowledge and understanding of Target Heart Rate Range (THRR);Able to check pulse independently;Understanding of Exercise Prescription  Increase Physical Activity;Increase Strength and Stamina;Able to understand and use rate of perceived exertion (RPE) scale;Able to understand and use Dyspnea scale;Knowledge and understanding of Target Heart Rate Range (THRR);Able to check pulse independently;Understanding of Exercise Prescription    Comments  Reviewed RPE scale, THR and program prescription with pt today.  Pt voiced understanding and was given a copy of goals to take home.   Dale Kennedy has improved overall METs and is using 7 lb weights for strength work.  he is not reaching THR range - staff will monitor.  Reviewed home exercise with pt today.  Pt plans to use his membership at [employer] and [employer] for exercise.  Reviewed THR, pulse, RPE, sign and symptoms, NTG use, and when to call 911 or MD.  Also discussed weather considerations and indoor options.  Pt voiced understanding.  Dale Kennedy is making great progress with exercise.  He is reaching THR and RPE goals.    Dale Kennedy improved on walk test by 715 feet.  He increased overall MET level.   Expected Outcomes  Short: Use RPE daily to regulate intensity. Long: Follow program prescription in THR.  Short - Dale Kennedy will continue to attend three days per wek Long - increase MET level  Short: Add 1-2 days of exercise outside of class. Long: Become independent with exercise program.   Short - continue to attend consistently Long - increase overall METs  Short - complete HT program Long - maintain fitness on his own      Nutrition & Weight - Outcomes: Pre Biometrics - 12/16/17 1429      Pre Biometrics   Height  6' (1.829 m)    Weight  235 lb 3.2 oz (106.7 kg)    Waist Circumference  39.5 inches    Hip Circumference  44.5 inches    Waist to Hip Ratio  0.89 %    BMI (Calculated)  31.89    Single Leg Stand  30 seconds      Post Biometrics - 01/31/18 1700       Post  Biometrics   Height  6' (1.829 m)    Weight  239 lb (108.4 kg)    Waist Circumference  41 inches    Hip Circumference  44.5 inches    Waist to Hip Ratio  0.92 %    BMI (Calculated)  32.41    Single Leg Stand  30 seconds       Nutrition: Nutrition Therapy & Goals - 01/10/18 1705      Nutrition Therapy   Diet  TLC    Drug/Food Interactions  Statins/Certain Fruits    Protein (specify units)  9oz    Fiber  35 grams     Whole Grain Foods  3 servings   chooses brown rice   Saturated Fats  15 max. grams    Fruits and Vegetables  8 servings/day   working to increase fruit  and vegetable intake through vegetarian diet 5 days/week   Sodium  1500 grams      Personal Nutrition Goals   Nutrition Goal  Continue to work on eating less fried food. Using the air fryer to make french fries is a great idea!    Personal Goal #2  Work to increase variety of vegetables you eat each week    Comments  He has been eating vegetarian 5 days/week and eats chicken and fish during the weekends. He has been trying to eat less fried foods like french fries and has been watching his sodium intake. Eats whole grains like brown rice and is eating more fruits. He just purchased an air fryer but has yet to use it. He has been drinking smoothies with added protein and flaxseed for some lunches during the week      Intervention Plan   Intervention  Prescribe, educate and counsel regarding individualized specific dietary modifications aiming towards targeted core components such as weight, hypertension, lipid management, diabetes, heart failure and other comorbidities.;Nutrition handout(s) given to patient.   low sodium seasoning blend handout, heart healthy eating handout   Expected Outcomes  Short Term Goal: A plan has been developed with personal nutrition goals set during dietitian appointment.;Long Term Goal: Adherence to prescribed nutrition plan.;Short Term Goal: Understand basic principles of dietary content, such as calories, fat, sodium, cholesterol and nutrients.       Nutrition Discharge: Nutrition Assessments - 12/16/17 1438      MEDFICTS Scores   Pre Score  33       Education Questionnaire Score: Knowledge Questionnaire Score - 12/16/17 1439      Knowledge Questionnaire Score   Pre Score  21/25   Reviewed correct answers with Dale Kennedy who verbalized understanding.       Goals reviewed with patient; copy given to  patient.

## 2018-02-11 ENCOUNTER — Inpatient Hospital Stay: Payer: BLUE CROSS/BLUE SHIELD | Attending: Oncology | Admitting: Oncology

## 2018-02-11 ENCOUNTER — Inpatient Hospital Stay: Payer: BLUE CROSS/BLUE SHIELD | Admitting: Oncology

## 2018-02-11 DIAGNOSIS — D472 Monoclonal gammopathy: Secondary | ICD-10-CM

## 2018-02-12 ENCOUNTER — Emergency Department
Admission: EM | Admit: 2018-02-12 | Discharge: 2018-02-12 | Disposition: A | Discharge status: Home / Self Care | Payer: BLUE CROSS/BLUE SHIELD | Attending: Emergency Medicine | Admitting: Emergency Medicine

## 2018-02-12 ENCOUNTER — Emergency Department: Payer: BLUE CROSS/BLUE SHIELD

## 2018-02-12 ENCOUNTER — Other Ambulatory Visit: Payer: Self-pay

## 2018-02-12 DIAGNOSIS — J209 Acute bronchitis, unspecified: Secondary | ICD-10-CM | POA: Diagnosis not present

## 2018-02-12 DIAGNOSIS — Z87891 Personal history of nicotine dependence: Secondary | ICD-10-CM | POA: Insufficient documentation

## 2018-02-12 DIAGNOSIS — I509 Heart failure, unspecified: Secondary | ICD-10-CM | POA: Diagnosis not present

## 2018-02-12 DIAGNOSIS — I110 Hypertensive heart disease with heart failure: Secondary | ICD-10-CM | POA: Diagnosis not present

## 2018-02-12 DIAGNOSIS — Z7902 Long term (current) use of antithrombotics/antiplatelets: Secondary | ICD-10-CM | POA: Insufficient documentation

## 2018-02-12 DIAGNOSIS — J45909 Unspecified asthma, uncomplicated: Secondary | ICD-10-CM | POA: Diagnosis not present

## 2018-02-12 DIAGNOSIS — I2510 Atherosclerotic heart disease of native coronary artery without angina pectoris: Secondary | ICD-10-CM | POA: Diagnosis not present

## 2018-02-12 DIAGNOSIS — R05 Cough: Secondary | ICD-10-CM | POA: Diagnosis not present

## 2018-02-12 DIAGNOSIS — Z7982 Long term (current) use of aspirin: Secondary | ICD-10-CM | POA: Diagnosis not present

## 2018-02-12 DIAGNOSIS — Z79899 Other long term (current) drug therapy: Secondary | ICD-10-CM | POA: Insufficient documentation

## 2018-02-12 LAB — MISC LABCORP TEST (SEND OUT)
LabCorp test name: 1
Labcorp test code: 16873

## 2018-02-12 MED ORDER — AMOXICILLIN 875 MG PO TABS: 875.0000 mg | ORAL_TABLET | Freq: Two times a day (BID) | ORAL | 0 refills | Status: DC

## 2018-02-12 MED ORDER — BENZONATATE 200 MG PO CAPS: 200.0000 mg | ORAL_CAPSULE | Freq: Three times a day (TID) | ORAL | 0 refills | Status: DC | PRN

## 2018-02-12 NOTE — ED Triage Notes (Signed)
Pt states cough 1.5 weeks ago. States somewhat productive. States facial pressure and nasal congestion. Denies fevers. A&O, ambulatory.
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
## 2018-02-12 NOTE — ED Notes (Signed)
ED Provider at bedside. 

## 2018-02-12 NOTE — Discharge Instructions (Signed)
Follow-up with your regular doctor if not better in 3 to 5 days.  Return emergency department if worsening.

## 2018-02-15 ENCOUNTER — Telehealth: Payer: Self-pay

## 2018-02-15 NOTE — Telephone Encounter (Signed)
Has had bronchitis - plans to return to graduate

## 2018-02-24 DIAGNOSIS — I272 Pulmonary hypertension, unspecified: Secondary | ICD-10-CM | POA: Diagnosis not present

## 2018-02-24 DIAGNOSIS — E785 Hyperlipidemia, unspecified: Secondary | ICD-10-CM | POA: Diagnosis not present

## 2018-02-24 DIAGNOSIS — I25110 Atherosclerotic heart disease of native coronary artery with unstable angina pectoris: Secondary | ICD-10-CM | POA: Diagnosis not present

## 2018-02-24 DIAGNOSIS — Z955 Presence of coronary angioplasty implant and graft: Secondary | ICD-10-CM

## 2018-02-24 DIAGNOSIS — Z79899 Other long term (current) drug therapy: Secondary | ICD-10-CM | POA: Diagnosis not present

## 2018-02-24 DIAGNOSIS — I200 Unstable angina: Secondary | ICD-10-CM | POA: Diagnosis not present

## 2018-02-24 DIAGNOSIS — Z87891 Personal history of nicotine dependence: Secondary | ICD-10-CM | POA: Diagnosis not present

## 2018-02-24 DIAGNOSIS — G709 Myoneural disorder, unspecified: Secondary | ICD-10-CM | POA: Diagnosis not present

## 2018-02-24 DIAGNOSIS — I509 Heart failure, unspecified: Secondary | ICD-10-CM | POA: Diagnosis not present

## 2018-02-24 DIAGNOSIS — Z7982 Long term (current) use of aspirin: Secondary | ICD-10-CM | POA: Diagnosis not present

## 2018-02-24 DIAGNOSIS — I252 Old myocardial infarction: Secondary | ICD-10-CM | POA: Diagnosis not present

## 2018-02-24 DIAGNOSIS — I110 Hypertensive heart disease with heart failure: Secondary | ICD-10-CM | POA: Diagnosis not present

## 2018-02-24 DIAGNOSIS — K219 Gastro-esophageal reflux disease without esophagitis: Secondary | ICD-10-CM | POA: Diagnosis not present

## 2018-02-24 NOTE — Progress Notes (Signed)
Discharge Progress Report  Patient Details  Name: Dale Kennedy MRN: [id number] Date of Birth: Mar 08, 1976 Referring Provider:     Cardiac Rehab from 12/16/2017 in [facility]  Referring Provider  Humphreys       Number of Visits: 36/36  Reason for Discharge:  Patient reached a stable level of exercise. Patient independent in their exercise. Patient has met program and personal goals.  Smoking History:  Social History   Tobacco Use  Smoking Status Former Smoker  . Packs/day: 1.00  . Types: Cigarettes  . Last attempt to quit: 04/06/2008  . Years since quitting: 9.8  Smokeless Tobacco Never Used    Diagnosis:  Status post coronary artery stent placement  ADL [facility]:   Initial Exercise Prescription: Initial Exercise Prescription - 12/16/17 1400      Date of Initial Exercise RX and Referring Provider   Date  12/16/17    Referring Provider  [name]      Treadmill   MPH  1.4    Grade  1    Minutes  15    METs  2.26      Recumbant Bike   Level  4    RPM  60    Watts  48    Minutes  15    METs  3.4      NuStep   Level  3    SPM  80    Minutes  15    METs  3.4      Prescription Details   Frequency (times per week)  3    Duration  Progress to 45 minutes of aerobic exercise without signs/symptoms of physical distress      Intensity   THRR 40-80% of Max Heartrate  120-159    Ratings of Perceived Exertion  11-13    Perceived Dyspnea  0-4      Resistance Training   Training Prescription  Yes    Weight  4 lb    Reps  10-15       Discharge Exercise Prescription (Final Exercise Prescription Changes): Exercise Prescription Changes - 02/16/18 1100      Response to Exercise   Blood Pressure (Admit)  140/88    Blood Pressure (Exercise)  140/96    Blood Pressure (Exit)  130/82    Heart Rate (Admit)  76 bpm    Heart Rate (Exercise)  103 bpm    Heart Rate (Exit)  103 bpm    Rating of Perceived Exertion (Exercise)  13    Symptoms  none    Duration   Continue with 45 min of aerobic exercise without signs/symptoms of physical distress.    Intensity  THRR unchanged      Progression   Progression  Continue to progress workloads to maintain intensity without signs/symptoms of physical distress.    Average METs  3.9      Resistance Training   Training Prescription  Yes    Weight  10 lb    Reps  10-15      Interval Training   Interval Training  No      Treadmill   MPH  2.8    Grade  2    Minutes  15    METs  3.9       Functional Capacity: 6 Minute Walk    Row Name 12/16/17 1432 01/31/18 1700       6 Minute Walk   Phase  Initial  Discharge  Distance  765 feet  1480 feet    Distance % Change  -  93.5 %    Distance Feet Change  -  715 ft    Walk Time  6 minutes  6 minutes    # of Rest Breaks  0  0    MPH  1.45  2.8    METS  3.43  4.9    RPE  12  13    Perceived Dyspnea   2  2    VO2 Peak  12.02  17.14    Symptoms  No  No    Resting HR  81 bpm  83 bpm    Resting BP  136/82  138/86    Resting Oxygen Saturation   97 %  96 %    Exercise Oxygen Saturation  during 6 min walk  97 %  97 %    Max Ex. HR  93 bpm  104 bpm    Max Ex. BP  156/88  164/86    2 Minute Post BP  128/78  -       Psychological, QOL, Others - Outcomes: PHQ 2/9: Depression screen [facility] 2/9 12/16/2017 12/15/2017 10/19/2017 07/21/2017 05/28/2017  Decreased Interest 0 0 0 0 0  Down, Depressed, Hopeless 0 0 0 0 0  PHQ - 2 Score 0 0 0 0 0  Altered sleeping 2 - 0 - -  Tired, decreased energy 1 - 0 - -  Change in appetite 2 - 0 - -  Feeling bad or failure about yourself  0 - 0 - -  Trouble concentrating 0 - 0 - -  Moving slowly or fidgety/restless 0 - 0 - -  Suicidal thoughts 0 - 0 - -  PHQ-9 Score 5 - 0 - -  Difficult doing work/chores Not difficult at all - Not difficult at all - -    Quality of Life: Quality of Life - 12/16/17 1437      Quality of Life   Select  Quality of Life      Quality of Life Scores   Health/Function Pre  17.2 %     Socioeconomic Pre  27.75 %    Psych/Spiritual Pre  28.29 %    Family Pre  30 %    GLOBAL Pre  23.66 %       Personal Goals: Goals established at orientation with interventions provided to work toward goal. Personal Goals and Risk Factors at Admission - 12/16/17 1441      Core Components/Risk Factors/Patient Goals on Admission    Weight Management  Yes;Obesity    Intervention  Weight Management: Develop a combined nutrition and exercise program designed to reach desired caloric intake, while maintaining appropriate intake of nutrient and fiber, sodium and fats, and appropriate energy expenditure required for the weight goal.;Weight Management: Provide education and appropriate resources to help participant work on and attain dietary goals.;Weight Management/Obesity: Establish reasonable short term and long term weight goals.;Obesity: Provide education and appropriate resources to help participant work on and attain dietary goals.    Admit Weight  235 lb 3.2 oz (106.7 kg)    Goal Weight: Short Term  225 lb (102.1 kg)    Goal Weight: Long Term  200 lb (90.7 kg)    Expected Outcomes  Short Term: Continue to assess and modify interventions until short term weight is achieved;Long Term: Adherence to nutrition and physical activity/exercise program aimed toward attainment of established weight goal;Weight Maintenance: Understanding of the daily  nutrition guidelines, which includes 25-35% calories from fat, 7% or less cal from saturated fats, less than '200mg'$  cholesterol, less than 1.5gm of sodium, & 5 or more servings of fruits and vegetables daily;Weight Loss: Understanding of general recommendations for a balanced deficit meal plan, which promotes 1-2 lb weight loss per week and includes a negative energy balance of [phone] kcal/d;Understanding recommendations for meals to include 15-35% energy as protein, 25-35% energy from fat, 35-60% energy from carbohydrates, less than '200mg'$  of dietary cholesterol,  20-35 gm of total fiber daily;Understanding of distribution of calorie intake throughout the day with the consumption of 4-5 meals/snacks;Weight Gain: Understanding of general recommendations for a high calorie, high protein meal plan that promotes weight gain by distributing calorie intake throughout the day with the consumption for 4-5 meals, snacks, and/or supplements    Improve shortness of breath with ADL's  Yes    Intervention  Provide education, individualized exercise plan and daily activity instruction to help decrease symptoms of SOB with activities of daily living.    Expected Outcomes  Short Term: Improve cardiorespiratory fitness to achieve a reduction of symptoms when performing ADLs;Long Term: Be able to perform more ADLs without symptoms or delay the onset of symptoms    Hypertension  Yes    Intervention  Provide education on lifestyle modifcations including regular physical activity/exercise, weight management, moderate sodium restriction and increased consumption of fresh fruit, vegetables, and low fat dairy, alcohol moderation, and smoking cessation.;Monitor prescription use compliance.    Expected Outcomes  Short Term: Continued assessment and intervention until BP is < 140/21m HG in hypertensive participants. < 130/861mHG in hypertensive participants with diabetes, heart failure or chronic kidney disease.;Long Term: Maintenance of blood pressure at goal levels.    Lipids  Yes    Intervention  Provide education and support for participant on nutrition & aerobic/resistive exercise along with prescribed medications to achieve LDL '70mg'$ , HDL >'40mg'$ .    Expected Outcomes  Short Term: Participant states understanding of desired cholesterol values and is compliant with medications prescribed. Participant is following exercise prescription and nutrition guidelines.;Long Term: Cholesterol controlled with medications as prescribed, with individualized exercise RX and with personalized nutrition  plan. Value goals: LDL < '70mg'$ , HDL > 40 mg.    Stress  Yes    Intervention  Offer individual and/or small group education and counseling on adjustment to heart disease, stress management and health-related lifestyle change. Teach and support self-help strategies.;Refer participants experiencing significant psychosocial distress to appropriate mental health specialists for further evaluation and treatment. When possible, include family members and significant others in education/counseling sessions.    Expected Outcomes  Short Term: Participant demonstrates changes in health-related behavior, relaxation and other stress management skills, ability to obtain effective social support, and compliance with psychotropic medications if prescribed.;Long Term: Emotional wellbeing is indicated by absence of clinically significant psychosocial distress or social isolation.        Personal Goals Discharge:   Exercise Goals and Review: Exercise Goals    Row Name 12/16/17 1430             Exercise Goals   Increase Physical Activity  Yes       Intervention  Provide advice, education, support and counseling about physical activity/exercise needs.;Develop an individualized exercise prescription for aerobic and resistive training based on initial evaluation findings, risk stratification, comorbidities and participant's personal goals.       Expected Outcomes  Short Term: Attend rehab on a regular basis to increase amount of physical  activity.;Long Term: Add in home exercise to make exercise part of routine and to increase amount of physical activity.;Long Term: Exercising regularly at least 3-5 days a week.       Increase Strength and Stamina  Yes       Intervention  Provide advice, education, support and counseling about physical activity/exercise needs.;Develop an individualized exercise prescription for aerobic and resistive training based on initial evaluation findings, risk stratification, comorbidities and  participant's personal goals.       Expected Outcomes  Short Term: Increase workloads from initial exercise prescription for resistance, speed, and METs.;Short Term: Perform resistance training exercises routinely during rehab and add in resistance training at home;Long Term: Improve cardiorespiratory fitness, muscular endurance and strength as measured by increased METs and functional capacity (6MWT)       Able to understand and use rate of perceived exertion (RPE) scale  Yes       Intervention  Provide education and explanation on how to use RPE scale       Expected Outcomes  Short Term: Able to use RPE daily in rehab to express subjective intensity level;Long Term:  Able to use RPE to guide intensity level when exercising independently       Able to understand and use Dyspnea scale  Yes       Intervention  Provide education and explanation on how to use Dyspnea scale       Expected Outcomes  Short Term: Able to use Dyspnea scale daily in rehab to express subjective sense of shortness of breath during exertion;Long Term: Able to use Dyspnea scale to guide intensity level when exercising independently       Knowledge and understanding of Target Heart Rate Range (THRR)  Yes       Intervention  Provide education and explanation of THRR including how the numbers were predicted and where they are located for reference       Expected Outcomes  Short Term: Able to state/look up THRR;Short Term: Able to use daily as guideline for intensity in rehab       Able to check pulse independently  Yes       Intervention  Provide education and demonstration on how to check pulse in carotid and radial arteries.;Review the importance of being able to check your own pulse for safety during independent exercise       Expected Outcomes  Short Term: Able to explain why pulse checking is important during independent exercise;Long Term: Able to check pulse independently and accurately       Understanding of Exercise  Prescription  Yes       Intervention  Provide education, explanation, and written materials on patient's individual exercise prescription       Expected Outcomes  Short Term: Able to explain program exercise prescription;Long Term: Able to explain home exercise prescription to exercise independently          Exercise Goals Re-Evaluation: Exercise Goals Re-Evaluation    Row Name 12/20/17 1742 01/05/18 1205 01/06/18 1634 01/19/18 1111 02/01/18 1526     Exercise Goal Re-Evaluation   Exercise Goals Review  Increase Physical Activity;Able to understand and use rate of perceived exertion (RPE) scale;Knowledge and understanding of Target Heart Rate Range (THRR);Understanding of Exercise Prescription;Increase Strength and Stamina  Increase Physical Activity;Increase Strength and Stamina;Able to understand and use rate of perceived exertion (RPE) scale;Knowledge and understanding of Target Heart Rate Range (THRR);Understanding of Exercise Prescription  Increase Physical Activity;Increase Strength and Stamina;Able to understand and  use rate of perceived exertion (RPE) scale;Knowledge and understanding of Target Heart Rate Range (THRR);Understanding of Exercise Prescription  Increase Physical Activity;Increase Strength and Stamina;Able to understand and use rate of perceived exertion (RPE) scale;Knowledge and understanding of Target Heart Rate Range (THRR);Able to check pulse independently;Understanding of Exercise Prescription  Increase Physical Activity;Increase Strength and Stamina;Able to understand and use rate of perceived exertion (RPE) scale;Able to understand and use Dyspnea scale;Knowledge and understanding of Target Heart Rate Range (THRR);Able to check pulse independently;Understanding of Exercise Prescription   Comments  Reviewed RPE scale, THR and program prescription with pt today.  Pt voiced understanding and was given a copy of goals to take home.   Deo has improved overall METs and is using 7 lb  weights for strength work.  he is not reaching THR range - staff will monitor.  Reviewed home exercise with pt today.  Pt plans to use his membership at [employer] and [employer] for exercise.  Reviewed THR, pulse, RPE, sign and symptoms, NTG use, and when to call 911 or MD.  Also discussed weather considerations and indoor options.  Pt voiced understanding.  Kashawn is making great progress with exercise.  He is reaching THR and RPE goals.    Mykel improved on walk test by 715 feet.  He increased overall MET level.   Expected Outcomes  Short: Use RPE daily to regulate intensity. Long: Follow program prescription in THR.  Short - [place] will continue to attend three days per wek Long - increase MET level  Short: Add 1-2 days of exercise outside of class. Long: Become independent with exercise program.   Short - continue to attend consistently Long - increase overall METs  Short - complete HT program Long - maintain fitness on his own   [place] Name 02/16/18 1138             Exercise Goal Re-Evaluation   Exercise Goals Review  Increase Physical Activity;Increase Strength and Stamina;Able to understand and use rate of perceived exertion (RPE) scale;Knowledge and understanding of Target Heart Rate Range (THRR);Understanding of Exercise Prescription       Comments  Nicco has improved overall MET level to 4.9 based on walk test.  he is up to 10 lb weights for strength training.  He will graduate HT next session.         Expected Outcomes  Short - complete HT Long - maintain exercise on his own          Nutrition & Weight - Outcomes: Pre Biometrics - 12/16/17 1429      Pre Biometrics   Height  6' (1.829 m)    Weight  235 lb 3.2 oz (106.7 kg)    Waist Circumference  39.5 inches    Hip Circumference  44.5 inches    Waist to Hip Ratio  0.89 %    BMI (Calculated)  31.89    Single Leg Stand  30 seconds      Post Biometrics - 01/31/18 1700       Post  Biometrics   Height  6' (1.829 m)    Weight  239 lb  (108.4 kg)    Waist Circumference  41 inches    Hip Circumference  44.5 inches    Waist to Hip Ratio  0.92 %    BMI (Calculated)  32.41    Single Leg Stand  30 seconds       Nutrition: Nutrition Therapy & Goals - 01/10/18 1705  Nutrition Therapy   Diet  TLC    Drug/Food Interactions  Statins/Certain Fruits    Protein (specify units)  9oz    Fiber  35 grams    Whole Grain Foods  3 servings   chooses brown rice   Saturated Fats  15 max. grams    Fruits and Vegetables  8 servings/day   working to increase fruit and vegetable intake through vegetarian diet 5 days/week   Sodium  1500 grams      Personal Nutrition Goals   Nutrition Goal  Continue to work on eating less fried food. Using the air fryer to make french fries is a great idea!    Personal Goal #2  Work to increase variety of vegetables you eat each week    Comments  He has been eating vegetarian 5 days/week and eats chicken and fish during the weekends. He has been trying to eat less fried foods like french fries and has been watching his sodium intake. Eats whole grains like brown rice and is eating more fruits. He just purchased an air fryer but has yet to use it. He has been drinking smoothies with added protein and flaxseed for some lunches during the week      Intervention Plan   Intervention  Prescribe, educate and counsel regarding individualized specific dietary modifications aiming towards targeted core components such as weight, hypertension, lipid management, diabetes, heart failure and other comorbidities.;Nutrition handout(s) given to patient.   low sodium seasoning blend handout, heart healthy eating handout   Expected Outcomes  Short Term Goal: A plan has been developed with personal nutrition goals set during dietitian appointment.;Long Term Goal: Adherence to prescribed nutrition plan.;Short Term Goal: Understand basic principles of dietary content, such as calories, fat, sodium, cholesterol and nutrients.        Nutrition Discharge: Nutrition Assessments - 12/16/17 1438      MEDFICTS Scores   Pre Score  33       Education Questionnaire Score: Knowledge Questionnaire Score - 12/16/17 1439      Knowledge Questionnaire Score   Pre Score  21/25   Reviewed correct answers with Dodd who verbalized understanding.       Goals reviewed with patient; copy given to patient.
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
## 2018-02-24 NOTE — Progress Notes (Signed)
Daily Session Note  Patient Details  Name: Dale Kennedy MRN: [id number] Date of Birth: [birth year] Referring Provider:     Cardiac Rehab from 12/16/2017 in [facility] Cardiac and Pulmonary Rehab  Referring Provider  [name]      Encounter Date: 02/24/2018  Check In: Session Check In - 02/24/18 1621      Check-In   Supervising physician immediately available to respond to emergencies  See telemetry face sheet for immediately available ER MD    Location  [facility]-Cardiac & Pulmonary Rehab    Staff Present  Earlean Shawl, BS, ACSM CEP, Exercise Physiologist;Meredith Sherryll Burger, RN BSN;Miko Sirico RCP,RRT,BSRT    Medication changes reported      No    Fall or balance concerns reported     No    Warm-up and Cool-down  Performed on first and last piece of equipment    Resistance Training Performed  Yes    VAD Patient?  No    PAD/SET Patient?  No      Pain Assessment   Currently in Pain?  No/denies          Social History   Tobacco Use  Smoking Status Former Smoker  . Packs/day: 1.00  . Types: Cigarettes  . Last attempt to quit: 04/06/2008  . Years since quitting: 9.8  Smokeless Tobacco Never Used    Goals Met:  Proper associated with RPD/PD & O2 Sat Independence with exercise equipment Exercise tolerated well No report of cardiac concerns or symptoms Strength training completed today  Goals Unmet:  Not Applicable  Comments:  Iseah graduated today from  rehab with 36 sessions completed.  Details of the patient's exercise prescription and what He needs to do in order to continue the prescription and progress were discussed with patient.  Patient was given a copy of prescription and goals.  Patient verbalized understanding.  Joedy plans to continue to exercise by going to [employer].    Dr. Emily Filbert is Medical Director for [facility].

## 2018-02-26 ENCOUNTER — Other Ambulatory Visit: Payer: Self-pay

## 2018-02-26 ENCOUNTER — Encounter: Payer: Self-pay | Admitting: Emergency Medicine

## 2018-02-26 ENCOUNTER — Emergency Department: Payer: BLUE CROSS/BLUE SHIELD

## 2018-02-26 ENCOUNTER — Emergency Department
Admission: EM | Admit: 2018-02-26 | Discharge: 2018-02-26 | Disposition: A | Discharge status: Home / Self Care | Payer: BLUE CROSS/BLUE SHIELD | Attending: Emergency Medicine | Admitting: Emergency Medicine

## 2018-02-26 DIAGNOSIS — Z7902 Long term (current) use of antithrombotics/antiplatelets: Secondary | ICD-10-CM | POA: Insufficient documentation

## 2018-02-26 DIAGNOSIS — I2510 Atherosclerotic heart disease of native coronary artery without angina pectoris: Secondary | ICD-10-CM | POA: Insufficient documentation

## 2018-02-26 DIAGNOSIS — Z8679 Personal history of other diseases of the circulatory system: Secondary | ICD-10-CM | POA: Diagnosis not present

## 2018-02-26 DIAGNOSIS — Z79899 Other long term (current) drug therapy: Secondary | ICD-10-CM | POA: Diagnosis not present

## 2018-02-26 DIAGNOSIS — J45909 Unspecified asthma, uncomplicated: Secondary | ICD-10-CM | POA: Diagnosis not present

## 2018-02-26 DIAGNOSIS — Z87891 Personal history of nicotine dependence: Secondary | ICD-10-CM | POA: Diagnosis not present

## 2018-02-26 DIAGNOSIS — Z955 Presence of coronary angioplasty implant and graft: Secondary | ICD-10-CM | POA: Diagnosis not present

## 2018-02-26 DIAGNOSIS — I509 Heart failure, unspecified: Secondary | ICD-10-CM | POA: Diagnosis not present

## 2018-02-26 DIAGNOSIS — I110 Hypertensive heart disease with heart failure: Secondary | ICD-10-CM | POA: Insufficient documentation

## 2018-02-26 DIAGNOSIS — Z7982 Long term (current) use of aspirin: Secondary | ICD-10-CM | POA: Diagnosis not present

## 2018-02-26 DIAGNOSIS — R6884 Jaw pain: Secondary | ICD-10-CM | POA: Diagnosis not present

## 2018-02-26 LAB — CBC WITH DIFFERENTIAL/PLATELET
Abs Immature Granulocytes: 0.03 10*3/uL (ref 0.00–0.07)
BASOS ABS: 0 10*3/uL (ref 0.0–0.1)
BASOS PCT: 1 %
EOS PCT: 2 %
Eosinophils Absolute: 0.1 10*3/uL (ref 0.0–0.5)
HCT: 43.4 % (ref 39.0–52.0)
HEMOGLOBIN: 12.9 g/dL — AB (ref 13.0–17.0)
Immature Granulocytes: 1 %
LYMPHS PCT: 19 %
Lymphs Abs: 1.1 10*3/uL (ref 0.7–4.0)
MCH: 22.6 pg — ABNORMAL LOW (ref 26.0–34.0)
MCHC: 29.7 g/dL — AB (ref 30.0–36.0)
MCV: 75.9 fL — ABNORMAL LOW (ref 80.0–100.0)
Monocytes Absolute: 0.6 10*3/uL (ref 0.1–1.0)
Monocytes Relative: 12 %
NEUTROS ABS: 3.7 10*3/uL (ref 1.7–7.7)
NEUTROS PCT: 65 %
PLATELETS: 416 10*3/uL — AB (ref 150–400)
RBC: 5.72 MIL/uL (ref 4.22–5.81)
RDW: 16.9 % — AB (ref 11.5–15.5)
WBC: 5.5 10*3/uL (ref 4.0–10.5)
nRBC: 0 % (ref 0.0–0.2)

## 2018-02-26 LAB — COMPREHENSIVE METABOLIC PANEL
ALK PHOS: 124 U/L (ref 38–126)
ALT: 32 U/L (ref 0–44)
ANION GAP: 10 (ref 5–15)
AST: 23 U/L (ref 15–41)
Albumin: 3.9 g/dL (ref 3.5–5.0)
BUN: 11 mg/dL (ref 6–20)
CHLORIDE: 103 mmol/L (ref 98–111)
CO2: 26 mmol/L (ref 22–32)
Calcium: 9.2 mg/dL (ref 8.9–10.3)
Creatinine, Ser: 0.9 mg/dL (ref 0.61–1.24)
GFR calc Af Amer: >60 mL/min (ref >60)
GFR calc non Af Amer: >60 mL/min (ref >60)
Glucose, Bld: 95 mg/dL (ref 70–99)
POTASSIUM: 4.1 mmol/L (ref 3.5–5.1)
SODIUM: 139 mmol/L (ref 135–145)
Total Bilirubin: 0.4 mg/dL (ref 0.3–1.2)
Total Protein: 8.8 g/dL — ABNORMAL HIGH (ref 6.5–8.1)

## 2018-02-26 LAB — TROPONIN I
Troponin I: <0.03 ng/mL (ref <0.03)
Troponin I: <0.03 ng/mL (ref <0.03)
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
## 2018-02-26 NOTE — Discharge Instructions (Addendum)
To the emergency room for any new or symptoms including increased pain, shortness of breath, chest pain, pain that reminds you of your heart attack, or any other concerns.  Follow closely with primary care and your cardiologist in the next 2 days.  Do not exert yourself until you see your cardiologist.

## 2018-02-26 NOTE — ED Notes (Signed)
Pt states he had a very sharp pain in the law when he was climbing in [place]. Pt has a cardic history and has sent placed 3 mths ago with Dr. Marlou Sa at [facility]. EDP at bedside.

## 2018-02-26 NOTE — ED Notes (Signed)
Pt back to the room
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
## 2018-03-04 ENCOUNTER — Other Ambulatory Visit: Payer: Self-pay | Admitting: Family Medicine

## 2018-03-06 DIAGNOSIS — G4733 Obstructive sleep apnea (adult) (pediatric): Secondary | ICD-10-CM | POA: Diagnosis not present

## 2018-03-07 ENCOUNTER — Encounter: Admit: 2018-03-07 | Discharge: 2018-03-08 | Payer: BLUE CROSS/BLUE SHIELD | Attending: Clinical | Primary: Clinical

## 2018-03-07 DIAGNOSIS — F4323 Adjustment disorder with mixed anxiety and depressed mood: Principal | ICD-10-CM

## 2018-03-10 ENCOUNTER — Emergency Department
Admission: EM | Admit: 2018-03-10 | Discharge: 2018-03-10 | Disposition: A | Discharge status: Home / Self Care | Payer: BLUE CROSS/BLUE SHIELD | Attending: Emergency Medicine | Admitting: Emergency Medicine

## 2018-03-10 ENCOUNTER — Emergency Department: Payer: BLUE CROSS/BLUE SHIELD

## 2018-03-10 DIAGNOSIS — R0602 Shortness of breath: Secondary | ICD-10-CM | POA: Insufficient documentation

## 2018-03-10 DIAGNOSIS — Z79899 Other long term (current) drug therapy: Secondary | ICD-10-CM | POA: Insufficient documentation

## 2018-03-10 DIAGNOSIS — Z87891 Personal history of nicotine dependence: Secondary | ICD-10-CM | POA: Insufficient documentation

## 2018-03-10 DIAGNOSIS — R0600 Dyspnea, unspecified: Secondary | ICD-10-CM | POA: Insufficient documentation

## 2018-03-10 DIAGNOSIS — I2510 Atherosclerotic heart disease of native coronary artery without angina pectoris: Secondary | ICD-10-CM | POA: Diagnosis not present

## 2018-03-10 DIAGNOSIS — I509 Heart failure, unspecified: Secondary | ICD-10-CM | POA: Insufficient documentation

## 2018-03-10 DIAGNOSIS — R059 Cough, unspecified: Secondary | ICD-10-CM

## 2018-03-10 DIAGNOSIS — R110 Nausea: Secondary | ICD-10-CM | POA: Diagnosis not present

## 2018-03-10 DIAGNOSIS — Z955 Presence of coronary angioplasty implant and graft: Secondary | ICD-10-CM | POA: Diagnosis not present

## 2018-03-10 DIAGNOSIS — I110 Hypertensive heart disease with heart failure: Secondary | ICD-10-CM | POA: Insufficient documentation

## 2018-03-10 DIAGNOSIS — R748 Abnormal levels of other serum enzymes: Secondary | ICD-10-CM | POA: Diagnosis not present

## 2018-03-10 DIAGNOSIS — Z7982 Long term (current) use of aspirin: Secondary | ICD-10-CM | POA: Diagnosis not present

## 2018-03-10 DIAGNOSIS — I10 Essential (primary) hypertension: Secondary | ICD-10-CM | POA: Diagnosis not present

## 2018-03-10 DIAGNOSIS — R05 Cough: Secondary | ICD-10-CM | POA: Diagnosis not present

## 2018-03-10 DIAGNOSIS — R079 Chest pain, unspecified: Secondary | ICD-10-CM | POA: Diagnosis not present

## 2018-03-10 DIAGNOSIS — Z7902 Long term (current) use of antithrombotics/antiplatelets: Secondary | ICD-10-CM | POA: Diagnosis not present

## 2018-03-10 DIAGNOSIS — R0789 Other chest pain: Secondary | ICD-10-CM | POA: Diagnosis not present

## 2018-03-10 LAB — BASIC METABOLIC PANEL
Anion gap: 6 (ref 5–15)
BUN: 18 mg/dL (ref 6–20)
CO2: 26 mmol/L (ref 22–32)
Calcium: 8.8 mg/dL — ABNORMAL LOW (ref 8.9–10.3)
Chloride: 104 mmol/L (ref 98–111)
Creatinine, Ser: 1.07 mg/dL (ref 0.61–1.24)
GFR calc Af Amer: >60 mL/min (ref >60)
GFR calc non Af Amer: >60 mL/min (ref >60)
Glucose, Bld: 103 mg/dL — ABNORMAL HIGH (ref 70–99)
Potassium: 4.1 mmol/L (ref 3.5–5.1)
Sodium: 136 mmol/L (ref 135–145)

## 2018-03-10 LAB — BRAIN NATRIURETIC PEPTIDE: B Natriuretic Peptide: 5 pg/mL (ref 0.0–100.0)

## 2018-03-10 LAB — CBC
HEMATOCRIT: 42.8 % (ref 39.0–52.0)
Hemoglobin: 12.7 g/dL — ABNORMAL LOW (ref 13.0–17.0)
MCH: 22.6 pg — ABNORMAL LOW (ref 26.0–34.0)
MCHC: 29.7 g/dL — ABNORMAL LOW (ref 30.0–36.0)
MCV: 76 fL — AB (ref 80.0–100.0)
Platelets: 386 10*3/uL (ref 150–400)
RBC: 5.63 MIL/uL (ref 4.22–5.81)
RDW: 16.7 % — ABNORMAL HIGH (ref 11.5–15.5)
WBC: 5.6 10*3/uL (ref 4.0–10.5)
nRBC: 0 % (ref 0.0–0.2)

## 2018-03-10 LAB — TROPONIN I: Troponin I: <0.03 ng/mL (ref <0.03)

## 2018-03-10 MED ORDER — AZITHROMYCIN 250 MG PO TABS: ORAL_TABLET | ORAL | 0 refills | Status: AC

## 2018-03-10 NOTE — ED Triage Notes (Signed)
Patient c/o intermittent SOB X 1 week with increasing frequency today. Patient c/o intermittent cough X 3 months. Patient reports medial chest discomfort. Patient c/o nausea. Patient reports hx of previous MI with stent placement.

## 2018-03-10 NOTE — ED Notes (Signed)
Patient verbalized understanding of discharge instructions, no questions. Patient ambulated out of ED with steady gait in no distress.  
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
## 2018-03-11 DIAGNOSIS — I10 Essential (primary) hypertension: Secondary | ICD-10-CM | POA: Diagnosis not present

## 2018-03-11 DIAGNOSIS — R0789 Other chest pain: Secondary | ICD-10-CM | POA: Diagnosis not present

## 2018-03-11 DIAGNOSIS — R748 Abnormal levels of other serum enzymes: Secondary | ICD-10-CM | POA: Diagnosis not present

## 2018-03-11 DIAGNOSIS — Z87891 Personal history of nicotine dependence: Secondary | ICD-10-CM | POA: Diagnosis not present

## 2018-03-11 DIAGNOSIS — R110 Nausea: Secondary | ICD-10-CM | POA: Diagnosis not present

## 2018-03-11 DIAGNOSIS — I480 Paroxysmal atrial fibrillation: Secondary | ICD-10-CM | POA: Diagnosis not present

## 2018-03-11 DIAGNOSIS — R079 Chest pain, unspecified: Secondary | ICD-10-CM | POA: Diagnosis not present

## 2018-03-11 DIAGNOSIS — F419 Anxiety disorder, unspecified: Secondary | ICD-10-CM | POA: Diagnosis not present

## 2018-03-11 DIAGNOSIS — J40 Bronchitis, not specified as acute or chronic: Secondary | ICD-10-CM | POA: Diagnosis not present

## 2018-03-11 DIAGNOSIS — I2510 Atherosclerotic heart disease of native coronary artery without angina pectoris: Secondary | ICD-10-CM | POA: Diagnosis not present

## 2018-03-11 DIAGNOSIS — Z7982 Long term (current) use of aspirin: Secondary | ICD-10-CM | POA: Diagnosis not present

## 2018-03-11 DIAGNOSIS — R0602 Shortness of breath: Secondary | ICD-10-CM | POA: Diagnosis not present

## 2018-03-12 ENCOUNTER — Encounter: Admit: 2018-03-12 | Discharge: 2018-03-12 | Payer: BLUE CROSS/BLUE SHIELD

## 2018-03-12 DIAGNOSIS — R079 Chest pain, unspecified: Principal | ICD-10-CM

## 2018-03-12 DIAGNOSIS — Z955 Presence of coronary angioplasty implant and graft: Secondary | ICD-10-CM | POA: Diagnosis not present

## 2018-03-12 DIAGNOSIS — I517 Cardiomegaly: Secondary | ICD-10-CM | POA: Diagnosis not present

## 2018-03-12 DIAGNOSIS — I2510 Atherosclerotic heart disease of native coronary artery without angina pectoris: Secondary | ICD-10-CM | POA: Diagnosis not present

## 2018-03-12 DIAGNOSIS — I10 Essential (primary) hypertension: Secondary | ICD-10-CM | POA: Diagnosis not present

## 2018-03-12 DIAGNOSIS — R0789 Other chest pain: Secondary | ICD-10-CM | POA: Diagnosis not present

## 2018-03-13 MED ORDER — ISOSORBIDE MONONITRATE ER 30 MG TABLET,EXTENDED RELEASE 24 HR
ORAL_TABLET | Freq: Every day | ORAL | 0 refills | 0 days | Status: CP
Start: 2018-03-13 — End: 2018-04-13

## 2018-03-21 ENCOUNTER — Ambulatory Visit: Admit: 2018-03-21 | Discharge: 2018-03-22 | Payer: BLUE CROSS/BLUE SHIELD | Attending: Clinical | Primary: Clinical

## 2018-03-21 DIAGNOSIS — F4323 Adjustment disorder with mixed anxiety and depressed mood: Principal | ICD-10-CM

## 2018-03-22 ENCOUNTER — Ambulatory Visit
Admission: RE | Admit: 2018-03-22 | Discharge: 2018-03-22 | Disposition: A | Discharge status: Home / Self Care | Payer: BLUE CROSS/BLUE SHIELD | Source: Ambulatory Visit | Attending: Family Medicine | Admitting: Family Medicine

## 2018-03-22 ENCOUNTER — Ambulatory Visit (INDEPENDENT_AMBULATORY_CARE_PROVIDER_SITE_OTHER): Payer: BLUE CROSS/BLUE SHIELD | Admitting: Family Medicine

## 2018-03-22 ENCOUNTER — Encounter: Payer: Self-pay | Admitting: Family Medicine

## 2018-03-22 ENCOUNTER — Other Ambulatory Visit
Admission: RE | Admit: 2018-03-22 | Discharge: 2018-03-22 | Disposition: A | Discharge status: Home / Self Care | Payer: BLUE CROSS/BLUE SHIELD | Source: Ambulatory Visit | Attending: Family Medicine | Admitting: Family Medicine

## 2018-03-22 VITALS — BP 122/80 | HR 70 | Temp 98.3°F | Ht 72.0 in | Wt 231.8 lb

## 2018-03-22 DIAGNOSIS — I2510 Atherosclerotic heart disease of native coronary artery without angina pectoris: Secondary | ICD-10-CM | POA: Diagnosis not present

## 2018-03-22 DIAGNOSIS — M25562 Pain in left knee: Secondary | ICD-10-CM | POA: Insufficient documentation

## 2018-03-22 DIAGNOSIS — M79604 Pain in right leg: Secondary | ICD-10-CM | POA: Diagnosis not present

## 2018-03-22 LAB — LIPID PANEL
Cholesterol: 112 mg/dL (ref 0–200)
HDL: 30 mg/dL — AB (ref >40)
LDL Cholesterol: 71 mg/dL (ref 0–99)
Total CHOL/HDL Ratio: 3.7 RATIO
Triglycerides: 56 mg/dL (ref <150)
VLDL: 11 mg/dL (ref 0–40)

## 2018-03-22 LAB — FIBRIN DERIVATIVES D-DIMER (ARMC ONLY): Fibrin derivatives D-dimer (ARMC): 491.24 ng/mL (FEU) (ref 0.00–499.00)

## 2018-03-22 NOTE — Patient Instructions (Addendum)
Have the lab done first (STAT D-dimer and a non-stat lipid panel) and THEN have xrays done Both of those will be at the hospital I'll contact you at [phone] about the test results (D-dimer)  Check out the information at familydoctor.org entitled "Nutrition for Weight Loss: What You Need to Know about Fad Diets" Try to lose between 1-2 pounds per week by taking in fewer calories and burning off more calories You can succeed by limiting portions, limiting foods dense in calories and fat, becoming more active, and drinking 8 glasses of water a day (64 ounces) Don't skip meals, especially breakfast, as skipping meals may alter your metabolism Do not use over-the-counter weight loss pills or gimmicks that claim rapid weight loss A healthy BMI (or body mass index) is between 18.5 and 24.9 You can calculate your ideal BMI at the [place] website ClubMonetize.fr   Obesity, Adult Obesity is the condition of having too much total body fat. Being overweight or obese means that your weight is greater than what is considered healthy for your body size. Obesity is determined by a measurement called BMI. BMI is an estimate of body fat and is calculated from height and weight. For adults, a BMI of 30 or higher is considered obese. Obesity can eventually lead to other health concerns and major illnesses, including:  Stroke.  Coronary artery disease (CAD).  Type 2 diabetes.  Some types of cancer, including cancers of the colon, breast, uterus, and gallbladder.  Osteoarthritis.  High blood pressure (hypertension).  High cholesterol.  Sleep apnea.  Gallbladder stones.  Infertility problems.  What are the causes? The main cause of obesity is taking in (consuming) more calories than your body uses for energy. Other factors that contribute to this condition may include:  Being born with genes that make you more likely to become obese.  Having a  medical condition that causes obesity. These conditions include: ? Hypothyroidism. ? Polycystic ovarian syndrome (PCOS). ? Binge-eating disorder. ? Cushing syndrome.  Taking certain medicines, such as steroids, antidepressants, and seizure medicines.  Not being physically active (sedentary lifestyle).  Living where there are limited places to exercise safely or buy healthy foods.  Not getting enough sleep.  What increases the risk? The following factors may increase your risk of this condition:  Having a family history of obesity.  Being a woman of African-American descent.  Being a man of Hispanic descent.  What are the signs or symptoms? Having excessive body fat is the main symptom of this condition. How is this diagnosed? This condition may be diagnosed based on:  Your symptoms.  Your medical history.  A physical exam. Your health care provider may measure: ? Your BMI. If you are an adult with a BMI between 25 and less than 30, you are considered overweight. If you are an adult with a BMI of 30 or higher, you are considered obese. ? The distances around your hips and your waist (circumferences). These may be compared to each other to help diagnose your condition. ? Your skinfold thickness. Your health care provider may gently pinch a fold of your skin and measure it.  How is this treated? Treatment for this condition often includes changing your lifestyle. Treatment may include some or all of the following:  Dietary changes. Work with your health care provider and a dietitian to set a weight-loss goal that is healthy and reasonable for you. Dietary changes may include eating: ? Smaller portions. A portion size is the amount of a  particular food that is healthy for you to eat at one time. This varies from person to person. ? Low-calorie or low-fat options. ? More whole grains, fruits, and vegetables.  Regular physical activity. This may include aerobic activity  (cardio) and strength training.  Medicine to help you lose weight. Your health care provider may prescribe medicine if you are unable to lose 1 pound a week after 6 weeks of eating more healthily and doing more physical activity.  Surgery. Surgical options may include gastric banding and gastric bypass. Surgery may be done if: ? Other treatments have not helped to improve your condition. ? You have a BMI of 40 or higher. ? You have life-threatening health problems related to obesity.  Follow these instructions at home:  Eating and drinking   Follow recommendations from your health care provider about what you eat and drink. Your health care provider may advise you to: ? Limit fast foods, sweets, and processed snack foods. ? Choose low-fat options, such as low-fat milk instead of whole milk. ? Eat 5 or more servings of fruits or vegetables every day. ? Eat at home more often. This gives you more control over what you eat. ? Choose healthy foods when you eat out. ? Learn what a healthy portion size is. ? Keep low-fat snacks on hand. ? Avoid sugary drinks, such as soda, fruit juice, iced tea sweetened with sugar, and flavored milk. ? Eat a healthy breakfast.  Drink enough water to keep your urine clear or pale yellow.  Do not go without eating for long periods of time (do not fast) or follow a fad diet. Fasting and fad diets can be unhealthy and even dangerous. Physical Activity  Exercise regularly, as told by your health care provider. Ask your health care provider what types of exercise are safe for you and how often you should exercise.  Warm up and stretch before being active.  Cool down and stretch after being active.  Rest between periods of activity. Lifestyle  Limit the time that you spend in front of your TV, computer, or video game system.  Find ways to reward yourself that do not involve food.  Limit alcohol intake to no more than 1 drink a day for nonpregnant women  and 2 drinks a day for men. One drink equals 12 oz of beer, 5 oz of wine, or 1 oz of hard liquor. General instructions  Keep a weight loss journal to keep track of the food you eat and how much you exercise you get.  Take over-the-counter and prescription medicines only as told by your health care provider.  Take vitamins and supplements only as told by your health care provider.  Consider joining a support group. Your health care provider may be able to recommend a support group.  Keep all follow-up visits as told by your health care provider. This is important. Contact a health care provider if:  You are unable to meet your weight loss goal after 6 weeks of dietary and lifestyle changes. This information is not intended to replace advice given to you by your health care provider. Make sure you discuss any questions you have with your health care provider. Document Released: 04/30/2004 Document Revised: 08/26/2015 Document Reviewed: 01/09/2015 Elsevier Interactive Patient Education  2018 [place].  Preventing Unhealthy [employer], Adult Staying at a healthy weight is important. When fat builds up in your body, you may become overweight or obese. These conditions put you at greater risk for developing  certain health problems, such as heart disease, diabetes, sleeping problems, joint problems, and some cancers. Unhealthy weight gain is often the result of making unhealthy choices in what you eat. It is also a result of not getting enough exercise. You can make changes to your lifestyle to prevent obesity and stay as healthy as possible. What nutrition changes can be made? To maintain a healthy weight and prevent obesity:  Eat only as much as your body needs. To do this: ? Pay attention to signs that you are hungry or full. Stop eating as soon as you feel full. ? If you feel hungry, try drinking water first. Drink enough water so your urine is clear or pale yellow. ? Eat smaller  portions. ? Look at serving sizes on food labels. Most foods contain more than one serving per container. ? Eat the recommended amount of calories for your gender and activity level. While most active people should eat around 2,000 calories per day, if you are trying to lose weight or are not very active, you main need to eat less calories. Talk to your health care provider or dietitian about how many calories you should eat each day.  Choose healthy foods, such as: ? Fruits and vegetables. Try to fill at least half of your plate at each meal with fruits and vegetables. ? Whole grains, such as whole wheat bread, brown rice, and quinoa. ? Lean meats, such as chicken or fish. ? Other healthy proteins, such as beans, eggs, or tofu. ? Healthy fats, such as nuts, seeds, fatty fish, and olive oil. ? Low-fat or fat-free dairy.  Check food labels and avoid food and drinks that: ? Are high in calories. ? Have added sugar. ? Are high in sodium. ? Have saturated fats or trans fats.  Limit how much you eat of the following foods: ? Prepackaged meals. ? Fast food. ? Fried foods. ? Processed meat, such as bacon, sausage, and deli meats. ? Fatty cuts of red meat and poultry with skin.  Cook foods in healthier ways, such as by baking, broiling, or grilling.  When grocery shopping, try to shop around the outside of the store. This helps you buy mostly fresh foods and avoid canned and prepackaged foods.  What lifestyle changes can be made?  Exercise at least 30 minutes 5 or more days each week. Exercising includes brisk walking, yard work, biking, running, swimming, and team sports like basketball and soccer. Ask your health care provider which exercises are safe for you.  Do not use any products that contain nicotine or tobacco, such as cigarettes and e-cigarettes. If you need help quitting, ask your health care provider.  Limit alcohol intake to no more than 1 drink a day for nonpregnant women and  2 drinks a day for men. One drink equals 12 oz of beer, 5 oz of wine, or 1 oz of hard liquor.  Try to get 7-9 hours of sleep each night. What other changes can be made?  Keep a food and activity journal to keep track of: ? What you ate and how many calories you had. Remember to count sauces, dressings, and side dishes. ? Whether you were active, and what exercises you did. ? Your calorie, weight, and activity goals.  Check your weight regularly. Track any changes. If you notice you have gained weight, make changes to your diet or activity routine.  Avoid taking weight-loss medicines or supplements. Talk to your health care provider before starting any new medicine  or supplement.  Talk to your health care provider before trying any new diet or exercise plan. Why are these changes important? Eating healthy, staying active, and having healthy habits not only help prevent obesity, they also:  Help you to manage stress and emotions.  Help you to connect with friends and family.  Improve your self-esteem.  Improve your sleep.  Prevent long-term health problems.  What can happen if changes are not made? Being obese or overweight can cause you to develop joint or bone problems, which can make it hard for you to stay active or do activities you enjoy. Being obese or overweight also puts stress on your heart and lungs and can lead to health problems like diabetes, heart disease, and some cancers. Where to find more information: Talk with your health care provider or a dietitian about healthy eating and healthy lifestyle choices. You may also find other information through these resources:  U.S. Department of Agriculture MyPlate: FormerBoss.no  American Heart Association: www.heart.org  Centers for Disease Control and Prevention: http://www.wolf.info/  Summary  Staying at a healthy weight is important. It helps prevent certain diseases and health problems, such as heart disease,  diabetes, joint problems, sleep disorders, and some cancers.  Being obese or overweight can cause you to develop joint or bone problems, which can make it hard for you to stay active or do activities you enjoy.  You can prevent unhealthy weight gain by eating a healthy diet, exercising regularly, not smoking, limiting alcohol, and getting enough sleep.  Talk with your health care provider or a dietitian for guidance about healthy eating and healthy lifestyle choices. This information is not intended to replace advice given to you by your health care provider. Make sure you discuss any questions you have with your health care provider. Document Released: 03/24/2016 Document Revised: 04/29/2016 Document Reviewed: 04/29/2016 Elsevier Interactive Patient Education  [employer].
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
## 2018-03-22 NOTE — Assessment & Plan Note (Signed)
Goal LDL less than 70; check lipids, on statin

## 2018-03-24 ENCOUNTER — Encounter: Payer: Self-pay | Admitting: Family Medicine

## 2018-03-24 DIAGNOSIS — M25562 Pain in left knee: Secondary | ICD-10-CM

## 2018-03-24 NOTE — Telephone Encounter (Signed)
Please see his note; refer to ortho; encourage OTC magnesium 250 mg daily and a banana or orange juice for the eyelid twitching; if persistent, come in for BMP and magnesium check

## 2018-04-01 ENCOUNTER — Other Ambulatory Visit: Payer: Self-pay

## 2018-04-01 ENCOUNTER — Emergency Department
Admission: EM | Admit: 2018-04-01 | Discharge: 2018-04-01 | Disposition: A | Discharge status: Home / Self Care | Payer: BLUE CROSS/BLUE SHIELD | Attending: Emergency Medicine | Admitting: Emergency Medicine

## 2018-04-01 ENCOUNTER — Emergency Department: Payer: BLUE CROSS/BLUE SHIELD

## 2018-04-01 ENCOUNTER — Ambulatory Visit: Payer: BLUE CROSS/BLUE SHIELD | Admitting: Family Medicine

## 2018-04-01 ENCOUNTER — Encounter: Payer: Self-pay | Admitting: Emergency Medicine

## 2018-04-01 DIAGNOSIS — I2510 Atherosclerotic heart disease of native coronary artery without angina pectoris: Secondary | ICD-10-CM | POA: Diagnosis not present

## 2018-04-01 DIAGNOSIS — M25512 Pain in left shoulder: Secondary | ICD-10-CM | POA: Diagnosis not present

## 2018-04-01 DIAGNOSIS — Z87891 Personal history of nicotine dependence: Secondary | ICD-10-CM | POA: Diagnosis not present

## 2018-04-01 DIAGNOSIS — J45909 Unspecified asthma, uncomplicated: Secondary | ICD-10-CM | POA: Diagnosis not present

## 2018-04-01 DIAGNOSIS — I110 Hypertensive heart disease with heart failure: Secondary | ICD-10-CM | POA: Diagnosis not present

## 2018-04-01 DIAGNOSIS — I509 Heart failure, unspecified: Secondary | ICD-10-CM | POA: Insufficient documentation

## 2018-04-01 DIAGNOSIS — I4891 Unspecified atrial fibrillation: Secondary | ICD-10-CM | POA: Diagnosis not present

## 2018-04-01 DIAGNOSIS — Z79899 Other long term (current) drug therapy: Secondary | ICD-10-CM | POA: Diagnosis not present

## 2018-04-01 DIAGNOSIS — R079 Chest pain, unspecified: Secondary | ICD-10-CM | POA: Diagnosis not present

## 2018-04-01 DIAGNOSIS — I272 Pulmonary hypertension, unspecified: Secondary | ICD-10-CM | POA: Insufficient documentation

## 2018-04-01 DIAGNOSIS — Z7982 Long term (current) use of aspirin: Secondary | ICD-10-CM | POA: Insufficient documentation

## 2018-04-01 DIAGNOSIS — R0789 Other chest pain: Secondary | ICD-10-CM | POA: Diagnosis not present

## 2018-04-01 LAB — BASIC METABOLIC PANEL
Anion gap: 7 (ref 5–15)
BUN: 12 mg/dL (ref 6–20)
CO2: 26 mmol/L (ref 22–32)
Calcium: 9.2 mg/dL (ref 8.9–10.3)
Chloride: 104 mmol/L (ref 98–111)
Creatinine, Ser: 0.97 mg/dL (ref 0.61–1.24)
GFR calc Af Amer: >60 mL/min (ref >60)
GFR calc non Af Amer: >60 mL/min (ref >60)
GLUCOSE: 113 mg/dL — AB (ref 70–99)
Potassium: 4.1 mmol/L (ref 3.5–5.1)
Sodium: 137 mmol/L (ref 135–145)

## 2018-04-01 LAB — CBC
HCT: 43.3 % (ref 39.0–52.0)
Hemoglobin: 13 g/dL (ref 13.0–17.0)
MCH: 22.4 pg — ABNORMAL LOW (ref 26.0–34.0)
MCHC: 30 g/dL (ref 30.0–36.0)
MCV: 74.5 fL — ABNORMAL LOW (ref 80.0–100.0)
Platelets: 384 10*3/uL (ref 150–400)
RBC: 5.81 MIL/uL (ref 4.22–5.81)
RDW: 16.5 % — ABNORMAL HIGH (ref 11.5–15.5)
WBC: 6.1 10*3/uL (ref 4.0–10.5)
nRBC: 0 % (ref 0.0–0.2)

## 2018-04-01 LAB — TROPONIN I
Troponin I: <0.03 ng/mL (ref <0.03)
Troponin I: <0.03 ng/mL (ref <0.03)

## 2018-04-01 MED ORDER — KETOROLAC TROMETHAMINE 30 MG/ML IJ SOLN
30.0000 mg | Freq: Once | INTRAMUSCULAR | Status: AC
  Administered 2018-04-01: 30 mg via INTRAMUSCULAR
  Filled 2018-04-01: qty 1

## 2018-04-01 NOTE — ED Triage Notes (Signed)
Pt arrived via POV with reports of chest pain that started this morning. PT states he has had some back pain for several days and also reports left shoulder pain yesterday. Pt states he was doing some heavy lifting. Pt has hx of MI with stents. Pt takes coreg and plavix. Pt states he did get short of breath when the stabbing pain hit, but states it went away.  No distress noted on arrival

## 2018-04-01 NOTE — Discharge Instructions (Addendum)
Please seek medical attention for any high fevers, chest pain, shortness of breath, change in behavior, persistent vomiting, bloody stool or any other new or concerning symptoms.  

## 2018-04-01 NOTE — ED Notes (Signed)
Patient transported to X-ray 
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
## 2018-04-01 NOTE — ED Notes (Signed)
Pt goes to cardiologist in [place].

## 2018-04-02 ENCOUNTER — Encounter
Admit: 2018-04-02 | Discharge: 2018-04-03 | Disposition: A | Discharge status: Home / Self Care | Payer: BLUE CROSS/BLUE SHIELD

## 2018-04-02 DIAGNOSIS — R079 Chest pain, unspecified: Principal | ICD-10-CM

## 2018-04-02 DIAGNOSIS — E785 Hyperlipidemia, unspecified: Secondary | ICD-10-CM | POA: Diagnosis not present

## 2018-04-02 DIAGNOSIS — K219 Gastro-esophageal reflux disease without esophagitis: Secondary | ICD-10-CM | POA: Diagnosis not present

## 2018-04-02 DIAGNOSIS — Z6831 Body mass index (BMI) 31.0-31.9, adult: Secondary | ICD-10-CM | POA: Diagnosis not present

## 2018-04-02 DIAGNOSIS — Z888 Allergy status to other drugs, medicaments and biological substances status: Secondary | ICD-10-CM | POA: Diagnosis not present

## 2018-04-02 DIAGNOSIS — Z7902 Long term (current) use of antithrombotics/antiplatelets: Secondary | ICD-10-CM | POA: Diagnosis not present

## 2018-04-02 DIAGNOSIS — I10 Essential (primary) hypertension: Secondary | ICD-10-CM | POA: Diagnosis not present

## 2018-04-02 DIAGNOSIS — I2510 Atherosclerotic heart disease of native coronary artery without angina pectoris: Secondary | ICD-10-CM | POA: Diagnosis not present

## 2018-04-02 DIAGNOSIS — R0789 Other chest pain: Secondary | ICD-10-CM | POA: Diagnosis not present

## 2018-04-02 DIAGNOSIS — R002 Palpitations: Secondary | ICD-10-CM | POA: Diagnosis not present

## 2018-04-02 DIAGNOSIS — G4733 Obstructive sleep apnea (adult) (pediatric): Secondary | ICD-10-CM | POA: Diagnosis not present

## 2018-04-02 DIAGNOSIS — F419 Anxiety disorder, unspecified: Secondary | ICD-10-CM | POA: Diagnosis not present

## 2018-04-02 DIAGNOSIS — Z87891 Personal history of nicotine dependence: Secondary | ICD-10-CM | POA: Diagnosis not present

## 2018-04-02 DIAGNOSIS — E669 Obesity, unspecified: Secondary | ICD-10-CM | POA: Diagnosis not present

## 2018-04-02 DIAGNOSIS — Z79899 Other long term (current) drug therapy: Secondary | ICD-10-CM | POA: Diagnosis not present

## 2018-04-02 DIAGNOSIS — R748 Abnormal levels of other serum enzymes: Secondary | ICD-10-CM | POA: Diagnosis not present

## 2018-04-02 DIAGNOSIS — J45909 Unspecified asthma, uncomplicated: Secondary | ICD-10-CM | POA: Diagnosis not present

## 2018-04-02 DIAGNOSIS — Z7982 Long term (current) use of aspirin: Secondary | ICD-10-CM | POA: Diagnosis not present

## 2018-04-04 ENCOUNTER — Encounter: Payer: Self-pay | Admitting: Family Medicine

## 2018-04-04 DIAGNOSIS — G245 Blepharospasm: Secondary | ICD-10-CM

## 2018-04-06 DIAGNOSIS — G4733 Obstructive sleep apnea (adult) (pediatric): Secondary | ICD-10-CM | POA: Diagnosis not present

## 2018-04-07 ENCOUNTER — Ambulatory Visit (INDEPENDENT_AMBULATORY_CARE_PROVIDER_SITE_OTHER): Payer: Self-pay | Admitting: Family Medicine

## 2018-04-07 ENCOUNTER — Encounter: Payer: Self-pay | Admitting: Family Medicine

## 2018-04-07 VITALS — BP 140/88 | HR 66 | Temp 98.1°F | Ht 72.0 in | Wt 227.5 lb

## 2018-04-07 DIAGNOSIS — R634 Abnormal weight loss: Secondary | ICD-10-CM

## 2018-04-07 DIAGNOSIS — R718 Other abnormality of red blood cells: Secondary | ICD-10-CM

## 2018-04-07 DIAGNOSIS — G245 Blepharospasm: Secondary | ICD-10-CM | POA: Diagnosis not present

## 2018-04-07 DIAGNOSIS — R1013 Epigastric pain: Secondary | ICD-10-CM | POA: Diagnosis not present

## 2018-04-07 DIAGNOSIS — R4589 Other symptoms and signs involving emotional state: Secondary | ICD-10-CM | POA: Insufficient documentation

## 2018-04-07 DIAGNOSIS — F418 Other specified anxiety disorders: Secondary | ICD-10-CM

## 2018-04-07 DIAGNOSIS — I10 Essential (primary) hypertension: Secondary | ICD-10-CM

## 2018-04-07 MED ORDER — SERTRALINE HCL 50 MG PO TABS: ORAL_TABLET | ORAL | 0 refills | Status: DC

## 2018-04-07 MED ORDER — FAMOTIDINE 40 MG TABLET
ORAL_TABLET | Freq: Two times a day (BID) | ORAL | 3 refills | 0 days | Status: CP
Start: 2018-04-07 — End: 2018-04-22
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
## 2018-04-07 NOTE — Patient Instructions (Addendum)
Wash down your medicine with enough water to get it to the stomach and not hung up in the esophagus Do start the medicine from your cardiologist for acid reflux Let's get labs today Start the new sertraline for anxiety Call with any problems  Check out the information at familydoctor.org entitled "Nutrition for Weight Loss: What You Need to Know about Fad Diets" Try to lose between 1-2 pounds per week by taking in fewer calories and burning off more calories You can succeed by limiting portions, limiting foods dense in calories and fat, becoming more active, and drinking 8 glasses of water a day (64 ounces) Don't skip meals, especially breakfast, as skipping meals may alter your metabolism Do not use over-the-counter weight loss pills or gimmicks that claim rapid weight loss A healthy BMI (or body mass index) is between 18.5 and 24.9 You can calculate your ideal BMI at the [place] website [employer]Mon[employer]fr I'll recommend limiting sugary drinks Try to follow the DASH guidelines (DASH stands for Dietary Approaches to Stop Hypertension). Try to limit the sodium in your diet to no more than 1,500mg  of sodium per day. Certainly try to not exceed 2,000 mg per day at the very most. Do not add salt when cooking or at the table.  Check the sodium amount on labels when shopping, and choose items lower in sodium when given a choice. Avoid or limit foods that already contain a lot of sodium. Eat a diet rich in fruits and vegetables and whole grains, and try to lose weight if overweight or obese If your blood pressure is not to goal, then contact your cardiologist

## 2018-04-07 NOTE — Assessment & Plan Note (Signed)
Patient is seeing a Social worker; will start low dose SSRI; call with any problems before f/u; return in 3-4 weeks

## 2018-04-07 NOTE — Assessment & Plan Note (Signed)
Seeing hematologist; already checked and monitored by specialist

## 2018-04-07 NOTE — Assessment & Plan Note (Signed)
Not quite to goal today, but patient reports good control before this week; anxiety may play a part; diet around the holidays could be involved too; limit salt; check BP and patient to notify cardiologist if not to their goal; weight loss will help too

## 2018-04-08 LAB — TSH: TSH: 1.5 mIU/L (ref 0.40–4.50)

## 2018-04-08 LAB — H. PYLORI BREATH TEST: H. pylori Breath Test: NOT DETECTED

## 2018-04-08 LAB — MAGNESIUM: MAGNESIUM: 2.3 mg/dL (ref 1.5–2.5)

## 2018-04-08 LAB — AMYLASE: Amylase: 62 U/L (ref 21–101)

## 2018-04-13 ENCOUNTER — Encounter
Admit: 2018-04-13 | Discharge: 2018-04-14 | Payer: BLUE CROSS/BLUE SHIELD | Attending: Adult Health | Primary: Adult Health

## 2018-04-13 DIAGNOSIS — E785 Hyperlipidemia, unspecified: Secondary | ICD-10-CM

## 2018-04-13 DIAGNOSIS — F419 Anxiety disorder, unspecified: Secondary | ICD-10-CM

## 2018-04-13 DIAGNOSIS — I10 Essential (primary) hypertension: Secondary | ICD-10-CM

## 2018-04-13 DIAGNOSIS — I2510 Atherosclerotic heart disease of native coronary artery without angina pectoris: Principal | ICD-10-CM

## 2018-04-13 DIAGNOSIS — G4733 Obstructive sleep apnea (adult) (pediatric): Secondary | ICD-10-CM

## 2018-04-13 MED ORDER — ROSUVASTATIN 10 MG TABLET
ORAL_TABLET | Freq: Every day | ORAL | 11 refills | 0.00000 days | Status: CP
Start: 2018-04-13 — End: 2018-11-23

## 2018-04-13 MED ORDER — CARVEDILOL 12.5 MG TABLET
ORAL_TABLET | Freq: Two times a day (BID) | ORAL | 11 refills | 0.00000 days | Status: CP
Start: 2018-04-13 — End: 2018-05-17

## 2018-04-25 ENCOUNTER — Ambulatory Visit: Payer: BLUE CROSS/BLUE SHIELD | Admitting: Oncology

## 2018-04-25 ENCOUNTER — Other Ambulatory Visit: Payer: BLUE CROSS/BLUE SHIELD

## 2018-04-28 ENCOUNTER — Ambulatory Visit: Payer: Self-pay | Admitting: Family Medicine

## 2018-04-29 ENCOUNTER — Ambulatory Visit (INDEPENDENT_AMBULATORY_CARE_PROVIDER_SITE_OTHER): Payer: BLUE CROSS/BLUE SHIELD | Admitting: Nurse Practitioner

## 2018-04-29 ENCOUNTER — Encounter: Payer: Self-pay | Admitting: Nurse Practitioner

## 2018-04-29 VITALS — BP 122/78 | HR 96 | Temp 98.2°F | Resp 16 | Ht 72.0 in | Wt 224.9 lb

## 2018-04-29 DIAGNOSIS — K2100 Gastro-esophageal reflux disease with esophagitis, without bleeding: Secondary | ICD-10-CM

## 2018-04-29 DIAGNOSIS — R141 Gas pain: Secondary | ICD-10-CM

## 2018-04-29 DIAGNOSIS — R0981 Nasal congestion: Secondary | ICD-10-CM

## 2018-04-29 MED ORDER — FLUTICASONE PROPIONATE 50 MCG/ACT NA SUSP: 2.0000 | Freq: Every day | NASAL | 11 refills | Status: DC | PRN

## 2018-04-29 MED ORDER — SIMETHICONE 41.667 MG PO CHEW: 1.0000 | CHEWABLE_TABLET | Freq: Three times a day (TID) | ORAL | 2 refills | Status: DC | PRN

## 2018-04-29 MED ORDER — PANTOPRAZOLE SODIUM 40 MG PO TBEC: 40.0000 mg | DELAYED_RELEASE_TABLET | ORAL | 0 refills | Status: DC

## 2018-04-29 MED ORDER — FAMOTIDINE 20 MG TABLET
ORAL_TABLET | Freq: Two times a day (BID) | ORAL | 0 refills | 0 days | Status: CP
Start: 2018-04-29 — End: ?
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
## 2018-04-29 NOTE — Patient Instructions (Addendum)
-   take famotidine 20mg  at night - Take antacid such as tums or mylanta as needed for heart burn (if constipated use antacid with magnesium as an ingredient). Can take OTC simethicone as needed for bloating of gas pains - Take protonix every other day.   Try to use PLAIN allergy medicine without the decongestant. Avoid: phenylephrine, phenylpropanolamine, and pseudoephredine   Food Choices for Gastroesophageal Reflux Disease, Adult When you have gastroesophageal reflux disease (GERD), the foods you eat and your eating habits are very important. Choosing the right foods can help ease your discomfort. Think about working with a nutrition specialist (dietitian) to help you make good choices. What are tips for following this plan?  Meals  Choose healthy foods that are low in fat, such as fruits, vegetables, whole grains, low-fat dairy products, and lean meat, fish, and poultry.  Eat small meals often instead of 3 large meals a day. Eat your meals slowly, and in a place where you are relaxed. Avoid bending over or lying down until 2-3 hours after eating.  Avoid eating meals 2-3 hours before bed.  Avoid drinking a lot of liquid with meals.  Cook foods using methods other than frying. Bake, grill, or broil food instead.  Avoid or limit: ? Chocolate. ? Peppermint or spearmint. ? Alcohol. ? Pepper. ? Black and decaffeinated coffee. ? Black and decaffeinated tea. ? Bubbly (carbonated) soft drinks. ? Caffeinated energy drinks and soft drinks.  Limit high-fat foods such as: ? Fatty meat or fried foods. ? Whole milk, cream, butter, or ice cream. ? Nuts and nut butters. ? Pastries, donuts, and sweets made with butter or shortening.  Avoid foods that cause symptoms. These foods may be different for everyone. Common foods that cause symptoms include: ? Tomatoes. ? Oranges, lemons, and limes. ? Peppers. ? Spicy food. ? Onions and garlic. ? Vinegar. Lifestyle  Maintain a healthy weight.  Ask your doctor what weight is healthy for you. If you need to lose weight, work with your doctor to do so safely.  Exercise for at least 30 minutes for 5 or more days each week, or as told by your doctor.  Wear loose-fitting clothes.  Do not smoke. If you need help quitting, ask your doctor.  Sleep with the head of your bed higher than your feet. Use a wedge under the mattress or blocks under the bed frame to raise the head of the bed. Summary  When you have gastroesophageal reflux disease (GERD), food and lifestyle choices are very important in easing your symptoms.  Eat small meals often instead of 3 large meals a day. Eat your meals slowly, and in a place where you are relaxed.  Limit high-fat foods such as fatty meat or fried foods.  Avoid bending over or lying down until 2-3 hours after eating.  Avoid peppermint and spearmint, caffeine, alcohol, and chocolate. This information is not intended to replace advice given to you by your health care provider. Make sure you discuss any questions you have with your health care provider. Document Released: 09/22/2011 Document Revised: 04/28/2016 Document Reviewed: 04/28/2016 Elsevier Interactive Patient Education  2019 [employer].

## 2018-05-02 ENCOUNTER — Encounter: Admit: 2018-05-02 | Discharge: 2018-05-03 | Payer: BLUE CROSS/BLUE SHIELD | Attending: Clinical | Primary: Clinical

## 2018-05-02 ENCOUNTER — Ambulatory Visit: Payer: BLUE CROSS/BLUE SHIELD | Admitting: Oncology

## 2018-05-02 DIAGNOSIS — F4323 Adjustment disorder with mixed anxiety and depressed mood: Principal | ICD-10-CM

## 2018-05-07 DIAGNOSIS — G4733 Obstructive sleep apnea (adult) (pediatric): Secondary | ICD-10-CM | POA: Diagnosis not present

## 2018-05-08 ENCOUNTER — Encounter
Admit: 2018-05-08 | Discharge: 2018-05-09 | Disposition: A | Discharge status: Home / Self Care | Payer: BLUE CROSS/BLUE SHIELD | Attending: Emergency Medicine

## 2018-05-08 DIAGNOSIS — R079 Chest pain, unspecified: Principal | ICD-10-CM

## 2018-05-08 DIAGNOSIS — R0602 Shortness of breath: Secondary | ICD-10-CM

## 2018-05-08 DIAGNOSIS — I10 Essential (primary) hypertension: Secondary | ICD-10-CM | POA: Diagnosis not present

## 2018-05-08 DIAGNOSIS — Z6830 Body mass index (BMI) 30.0-30.9, adult: Secondary | ICD-10-CM | POA: Diagnosis not present

## 2018-05-08 DIAGNOSIS — Z955 Presence of coronary angioplasty implant and graft: Secondary | ICD-10-CM | POA: Diagnosis not present

## 2018-05-08 DIAGNOSIS — K219 Gastro-esophageal reflux disease without esophagitis: Secondary | ICD-10-CM | POA: Diagnosis not present

## 2018-05-08 DIAGNOSIS — E785 Hyperlipidemia, unspecified: Secondary | ICD-10-CM | POA: Diagnosis not present

## 2018-05-08 DIAGNOSIS — I2510 Atherosclerotic heart disease of native coronary artery without angina pectoris: Secondary | ICD-10-CM | POA: Diagnosis not present

## 2018-05-08 DIAGNOSIS — R110 Nausea: Secondary | ICD-10-CM | POA: Diagnosis not present

## 2018-05-08 DIAGNOSIS — Z87891 Personal history of nicotine dependence: Secondary | ICD-10-CM | POA: Diagnosis not present

## 2018-05-08 DIAGNOSIS — R0789 Other chest pain: Secondary | ICD-10-CM | POA: Diagnosis not present

## 2018-05-08 DIAGNOSIS — I480 Paroxysmal atrial fibrillation: Secondary | ICD-10-CM | POA: Diagnosis not present

## 2018-05-08 DIAGNOSIS — Z7982 Long term (current) use of aspirin: Secondary | ICD-10-CM | POA: Diagnosis not present

## 2018-05-10 ENCOUNTER — Encounter: Payer: Self-pay | Admitting: Family Medicine

## 2018-05-10 ENCOUNTER — Ambulatory Visit
Admit: 2018-05-10 | Discharge: 2018-05-11 | Payer: BLUE CROSS/BLUE SHIELD | Attending: Adult Health | Primary: Adult Health

## 2018-05-10 DIAGNOSIS — F419 Anxiety disorder, unspecified: Secondary | ICD-10-CM

## 2018-05-10 DIAGNOSIS — E785 Hyperlipidemia, unspecified: Secondary | ICD-10-CM

## 2018-05-10 DIAGNOSIS — I2510 Atherosclerotic heart disease of native coronary artery without angina pectoris: Principal | ICD-10-CM

## 2018-05-10 DIAGNOSIS — G4733 Obstructive sleep apnea (adult) (pediatric): Secondary | ICD-10-CM

## 2018-05-10 DIAGNOSIS — I10 Essential (primary) hypertension: Secondary | ICD-10-CM

## 2018-05-10 DIAGNOSIS — E782 Mixed hyperlipidemia: Secondary | ICD-10-CM

## 2018-05-14 ENCOUNTER — Encounter
Admit: 2018-05-14 | Discharge: 2018-05-14 | Disposition: A | Discharge status: Home / Self Care | Payer: BLUE CROSS/BLUE SHIELD

## 2018-05-14 DIAGNOSIS — R072 Precordial pain: Principal | ICD-10-CM

## 2018-05-14 DIAGNOSIS — Z7902 Long term (current) use of antithrombotics/antiplatelets: Secondary | ICD-10-CM | POA: Diagnosis not present

## 2018-05-14 DIAGNOSIS — R079 Chest pain, unspecified: Secondary | ICD-10-CM | POA: Diagnosis not present

## 2018-05-14 DIAGNOSIS — Z7982 Long term (current) use of aspirin: Secondary | ICD-10-CM | POA: Diagnosis not present

## 2018-05-14 DIAGNOSIS — I10 Essential (primary) hypertension: Secondary | ICD-10-CM | POA: Diagnosis not present

## 2018-05-14 DIAGNOSIS — M791 Myalgia, unspecified site: Secondary | ICD-10-CM | POA: Diagnosis not present

## 2018-05-14 DIAGNOSIS — Z888 Allergy status to other drugs, medicaments and biological substances status: Secondary | ICD-10-CM | POA: Diagnosis not present

## 2018-05-14 DIAGNOSIS — Z87891 Personal history of nicotine dependence: Secondary | ICD-10-CM | POA: Diagnosis not present

## 2018-05-14 DIAGNOSIS — R0789 Other chest pain: Secondary | ICD-10-CM | POA: Diagnosis not present

## 2018-05-14 DIAGNOSIS — M25511 Pain in right shoulder: Secondary | ICD-10-CM | POA: Diagnosis not present

## 2018-05-14 DIAGNOSIS — M25512 Pain in left shoulder: Secondary | ICD-10-CM | POA: Diagnosis not present

## 2018-05-14 DIAGNOSIS — Z955 Presence of coronary angioplasty implant and graft: Secondary | ICD-10-CM | POA: Diagnosis not present

## 2018-05-14 DIAGNOSIS — Z6830 Body mass index (BMI) 30.0-30.9, adult: Secondary | ICD-10-CM | POA: Diagnosis not present

## 2018-05-14 DIAGNOSIS — Z79899 Other long term (current) drug therapy: Secondary | ICD-10-CM | POA: Diagnosis not present

## 2018-05-14 DIAGNOSIS — K219 Gastro-esophageal reflux disease without esophagitis: Secondary | ICD-10-CM | POA: Diagnosis not present

## 2018-05-14 DIAGNOSIS — F419 Anxiety disorder, unspecified: Secondary | ICD-10-CM | POA: Diagnosis not present

## 2018-05-14 DIAGNOSIS — I2510 Atherosclerotic heart disease of native coronary artery without angina pectoris: Secondary | ICD-10-CM | POA: Diagnosis not present

## 2018-05-17 MED ORDER — CARVEDILOL 6.25 MG TABLET
ORAL_TABLET | Freq: Two times a day (BID) | ORAL | 3 refills | 0.00000 days | Status: CP
Start: 2018-05-17 — End: 2018-06-22

## 2018-05-18 ENCOUNTER — Encounter: Payer: Self-pay | Admitting: Gastroenterology

## 2018-05-18 ENCOUNTER — Ambulatory Visit: Payer: BLUE CROSS/BLUE SHIELD | Admitting: Gastroenterology

## 2018-05-18 ENCOUNTER — Telehealth: Payer: Self-pay | Admitting: Gastroenterology

## 2018-05-18 ENCOUNTER — Other Ambulatory Visit: Payer: Self-pay

## 2018-05-18 VITALS — BP 115/77 | HR 58 | Ht 72.0 in | Wt 224.6 lb

## 2018-05-18 DIAGNOSIS — K219 Gastro-esophageal reflux disease without esophagitis: Secondary | ICD-10-CM | POA: Diagnosis not present

## 2018-05-18 DIAGNOSIS — K580 Irritable bowel syndrome with diarrhea: Secondary | ICD-10-CM | POA: Diagnosis not present

## 2018-05-18 DIAGNOSIS — K921 Melena: Secondary | ICD-10-CM

## 2018-05-18 MED ORDER — FAMOTIDINE 40 MG PO TABS: 40.0000 mg | ORAL_TABLET | Freq: Two times a day (BID) | ORAL | 11 refills | Status: DC

## 2018-05-18 MED ORDER — SUCRALFATE 1 GM/10ML PO SUSP: 1.0000 g | Freq: Four times a day (QID) | ORAL | 1 refills | Status: DC

## 2018-05-18 NOTE — Telephone Encounter (Signed)
Patient called about his scheduled procedure with questions. I did not see where he had anything scheduled. Please call

## 2018-05-18 NOTE — Telephone Encounter (Signed)
Spoke with pt regarding his questions about the EGD procedure. I, again, explained the procedure and pt is aware that we are waiting for cardiac clearance from his cardiologist before we schedule the procedure.

## 2018-05-18 NOTE — Progress Notes (Signed)
Dale Bellows MD, MRCP(U.K) [address]  [place], [place] [address]  Main: [phone]  Fax: [phone]   Primary Care Physician: Arnetha Courser, MD  Primary Gastroenterologist:  Dr. Jonathon Kennedy   No chief complaint on file.   HPI: Dale Kennedy is a 43 y.o. male    Summary of history :  He was initially seen on 04/21/16 for abnormal LFT's, including a positive smooth muscle antibody . He also did have an iron deficiency anemia. Last few years has had an elevated alkaline phosphotase with normal transaminases .Fractionated components have been checked on two occasions show a mildly elevated liver component with normal bone components. GGT too has been mildly elevated in the past . RUQ USG shows no abnormality and normal bile ducts.  04/2016-MRCP normal  05/2016- Hb 13.1  HCV ab ,urine analysis , HIV ab ,celiac serology,HvsAgANA,AMA -negative Ceruloplasmin,A1AT - normal  F actin weakly positive 27  CRP elevated 2.6   05/2016  EGD- normal with duodenal biopsies  Colonoscopy -few ulcers seen on the ileo cecal valve - Normal TI biopsy IC valve showed mild acute inflammation -no chronic changes. Smooth muscle antibody has been elevated at 23 and 29 on two occasions when checked.  Interval history 06/2016-05/18/2018  10/2016 : Capsule study normal  Seen by Dr Grayland Ormond in 12/2017 for abnormal elevated kappa light chains , microcytosis , placed on oral iron.  04/07/2018 : H pylori breath test - negative.   03/2018 : HB 13 grams  02/2018 : LFT's normal  Today he is back to see me for GERD.  He says that he has been having a lot of bloating , passing has, burping for a few months , last few days his stools have been black, small qty  . Says he is not on any iron tablets. Slight sharp abdominal pains . He says he consumes 2 packets of splenda a day , soda a day , no chewing gum Passes a lot of gas and has been foul smelling . Stool is softer and sloppier than normal.  Pasty .   On plavix. Been on famotidine 20 mg .    Current Outpatient Medications  Medication Sig Dispense Refill  . ALPRAZolam (XANAX) 0.25 MG tablet Take 1 tablet (0.25 mg total) by mouth 2 (two) times daily as needed for anxiety. (Patient not taking: Reported on 04/29/2018) 14 tablet 0  . aspirin 81 MG tablet Take 81 mg by mouth daily.    . carvedilol (COREG) 6.25 MG tablet Take 2 tablets by mouth 2 (two) times daily.   11  . clopidogrel (PLAVIX) 75 MG tablet Take 1 tablet by mouth daily.    Marland Kitchen ezetimibe (ZETIA) 10 MG tablet TAKE 1 TABLET BY MOUTH EVERY MORNING FOR CHOLESTEROL 30 tablet 0  . famotidine (PEPCID) 40 MG tablet Take 40 mg by mouth 2 (two) times daily.    . fluticasone (FLONASE) 50 MCG/ACT nasal spray Place 2 sprays into both nostrils daily as needed. 16 g 11  . loratadine (CLARITIN) 10 MG tablet Take 1 tablet (10 mg total) by mouth daily as needed for allergies. 30 tablet 11  . nitroGLYCERIN (NITROSTAT) 0.4 MG SL tablet Place 1 tablet under the tongue as needed.  2  . pantoprazole (PROTONIX) 40 MG tablet Take 1 tablet (40 mg total) by mouth every other day. 45 tablet 0  . rosuvastatin (CRESTOR) 20 MG tablet Take 10 mg by mouth daily.  6  . sertraline (ZOLOFT)  50 MG tablet One-half of a pill daily for four days, then one whole pill daily (Patient not taking: Reported on 04/29/2018) 30 tablet 0  . Simethicone (MYLANTA GAS MINIS) 41.667 MG CHEW Chew 1 tablet by mouth 3 (three) times daily as needed. 30 tablet 2   No current facility-administered medications for this visit.     Allergies as of 05/18/2018 - Review Complete 04/29/2018  Allergen Reaction Noted  . Ace inhibitors Swelling 12/24/2016    ROS:  General: Negative for anorexia, weight loss, fever, chills, fatigue, weakness. ENT: Negative for hoarseness, difficulty swallowing , nasal congestion. CV: Negative for chest pain, angina, palpitations, dyspnea on exertion, peripheral edema.  Respiratory: Negative for dyspnea  at rest, dyspnea on exertion, cough, sputum, wheezing.  GI: See history of present illness. GU:  Negative for dysuria, hematuria, urinary incontinence, urinary frequency, nocturnal urination.  Endo: Negative for unusual weight change.    Physical Examination:   There were no vitals taken for this visit.  General: Well-nourished, well-developed in no acute distress.  Eyes: No icterus. Conjunctivae pink. Mouth: Oropharyngeal mucosa moist and pink , no lesions erythema or exudate. Lungs: Clear to auscultation bilaterally. Non-labored. Heart: Regular rate and rhythm, no murmurs rubs or gallops.  Abdomen: Bowel sounds are normal, nontender, nondistended, no hepatosplenomegaly or masses, no abdominal bruits or hernia , no rebound or guarding.   Extremities: No lower extremity edema. No clubbing or deformities. Neuro: Alert and oriented x 3.  Grossly intact. Skin: Warm and dry, no jaundice.   Psych: Alert and cooperative, normal mood and affect.   Imaging Studies: No results found.  Assessment and Plan:   Dale Kennedy is a 43 y.o. y/o male here to see me for GERD. Previously seen for microcytic iron def anemia which no cause was found on GI evaluation. Elevated alkaline phosphatase previously elevated has normalized. Follows with Dr Grayland Ormond for iron def anemia . Main issue today is black tarry stool and bloating,gas,sloppy stools     1. Melena - increase dose of famotidine, get clearance from cardiology to hold plavix for EGD(any physicians schedule asap). 2. Check CBC today and if stable check again on Monday  3. 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  The patient agrees with this plan & written consent will be obtained.      Dr Dale Bellows   MD,MRCP [facility]) Follow up in 2 weeks

## 2018-05-19 ENCOUNTER — Other Ambulatory Visit: Payer: Self-pay

## 2018-05-19 LAB — COMPREHENSIVE METABOLIC PANEL
ALT: 34 IU/L (ref 0–44)
AST: 20 IU/L (ref 0–40)
Albumin/Globulin Ratio: 1.3 (ref 1.2–2.2)
Albumin: 4.6 g/dL (ref 4.0–5.0)
Alkaline Phosphatase: 147 IU/L — ABNORMAL HIGH (ref 39–117)
BUN/Creatinine Ratio: 10 (ref 9–20)
BUN: 12 mg/dL (ref 6–24)
Bilirubin Total: 0.3 mg/dL (ref 0.0–1.2)
CO2: 23 mmol/L (ref 20–29)
Calcium: 9.4 mg/dL (ref 8.7–10.2)
Chloride: 101 mmol/L (ref 96–106)
Creatinine, Ser: 1.18 mg/dL (ref 0.76–1.27)
GFR calc Af Amer: 88 mL/min/{1.73_m2} (ref >59)
GFR calc non Af Amer: 76 mL/min/{1.73_m2} (ref >59)
Globulin, Total: 3.6 g/dL (ref 1.5–4.5)
Glucose: 82 mg/dL (ref 65–99)
Potassium: 4.9 mmol/L (ref 3.5–5.2)
Sodium: 140 mmol/L (ref 134–144)
Total Protein: 8.2 g/dL (ref 6.0–8.5)

## 2018-05-19 LAB — CBC WITH DIFFERENTIAL/PLATELET
Basophils Absolute: 0 10*3/uL (ref 0.0–0.2)
Basos: 1 %
EOS (ABSOLUTE): 0.1 10*3/uL (ref 0.0–0.4)
Eos: 1 %
Hematocrit: 42.2 % (ref 37.5–51.0)
Hemoglobin: 12.7 g/dL — ABNORMAL LOW (ref 13.0–17.7)
IMMATURE GRANULOCYTES: 0 %
Immature Grans (Abs): 0 10*3/uL (ref 0.0–0.1)
Lymphocytes Absolute: 1.4 10*3/uL (ref 0.7–3.1)
Lymphs: 28 %
MCH: 22.3 pg — ABNORMAL LOW (ref 26.6–33.0)
MCHC: 30.1 g/dL — ABNORMAL LOW (ref 31.5–35.7)
MCV: 74 fL — AB (ref 79–97)
Monocytes Absolute: 0.5 10*3/uL (ref 0.1–0.9)
Monocytes: 11 %
NEUTROS ABS: 2.9 10*3/uL (ref 1.4–7.0)
Neutrophils: 59 %
PLATELETS: 385 10*3/uL (ref 150–450)
RBC: 5.7 x10E6/uL (ref 4.14–5.80)
RDW: 16 % — ABNORMAL HIGH (ref 11.6–15.4)
WBC: 4.9 10*3/uL (ref 3.4–10.8)

## 2018-05-20 ENCOUNTER — Telehealth: Payer: Self-pay

## 2018-05-20 NOTE — Telephone Encounter (Signed)
Spoke with Dale Kennedy and informed him of lab results and Dr. Georgeann Oppenheim instructions for Dale Kennedy to stop taking the pepto bismol and repeat CBC lab test in one week. I also informed Dale Kennedy that we have received his cardiac clearance/ blood thinner holding instructions from Dr. Kandis Cocking. Dale Kennedy is not cleared to stop his Plavix until September of 2020.

## 2018-05-25 ENCOUNTER — Other Ambulatory Visit: Payer: Self-pay

## 2018-05-25 DIAGNOSIS — K921 Melena: Secondary | ICD-10-CM | POA: Diagnosis not present

## 2018-05-25 DIAGNOSIS — K580 Irritable bowel syndrome with diarrhea: Secondary | ICD-10-CM

## 2018-05-25 DIAGNOSIS — D509 Iron deficiency anemia, unspecified: Secondary | ICD-10-CM | POA: Diagnosis not present

## 2018-05-26 LAB — CBC WITH DIFFERENTIAL/PLATELET
Basophils Absolute: 0.1 10*3/uL (ref 0.0–0.2)
Basos: 1 %
EOS (ABSOLUTE): 0.1 10*3/uL (ref 0.0–0.4)
Eos: 1 %
Hematocrit: 41.6 % (ref 37.5–51.0)
Hemoglobin: 12.6 g/dL — ABNORMAL LOW (ref 13.0–17.7)
Immature Grans (Abs): 0 10*3/uL (ref 0.0–0.1)
Immature Granulocytes: 0 %
Lymphocytes Absolute: 1.5 10*3/uL (ref 0.7–3.1)
Lymphs: 22 %
MCH: 22.3 pg — ABNORMAL LOW (ref 26.6–33.0)
MCHC: 30.3 g/dL — ABNORMAL LOW (ref 31.5–35.7)
MCV: 74 fL — ABNORMAL LOW (ref 79–97)
Monocytes Absolute: 0.7 10*3/uL (ref 0.1–0.9)
Monocytes: 10 %
Neutrophils Absolute: 4.5 10*3/uL (ref 1.4–7.0)
Neutrophils: 66 %
Platelets: 371 10*3/uL (ref 150–450)
RBC: 5.65 x10E6/uL (ref 4.14–5.80)
RDW: 15.2 % (ref 11.6–15.4)
WBC: 6.7 10*3/uL (ref 3.4–10.8)

## 2018-06-05 DIAGNOSIS — M25561 Pain in right knee: Secondary | ICD-10-CM | POA: Diagnosis not present

## 2018-06-05 DIAGNOSIS — R262 Difficulty in walking, not elsewhere classified: Secondary | ICD-10-CM | POA: Diagnosis not present

## 2018-06-05 DIAGNOSIS — M6281 Muscle weakness (generalized): Secondary | ICD-10-CM | POA: Diagnosis not present

## 2018-06-05 DIAGNOSIS — G4733 Obstructive sleep apnea (adult) (pediatric): Secondary | ICD-10-CM | POA: Diagnosis not present

## 2018-06-07 DIAGNOSIS — I2510 Atherosclerotic heart disease of native coronary artery without angina pectoris: Principal | ICD-10-CM

## 2018-06-07 DIAGNOSIS — Z7902 Long term (current) use of antithrombotics/antiplatelets: Principal | ICD-10-CM

## 2018-06-07 DIAGNOSIS — I10 Essential (primary) hypertension: Principal | ICD-10-CM

## 2018-06-07 DIAGNOSIS — R0789 Other chest pain: Principal | ICD-10-CM

## 2018-06-07 DIAGNOSIS — E785 Hyperlipidemia, unspecified: Principal | ICD-10-CM

## 2018-06-07 DIAGNOSIS — Z87891 Personal history of nicotine dependence: Principal | ICD-10-CM

## 2018-06-07 DIAGNOSIS — K219 Gastro-esophageal reflux disease without esophagitis: Principal | ICD-10-CM

## 2018-06-07 DIAGNOSIS — Z7982 Long term (current) use of aspirin: Principal | ICD-10-CM

## 2018-06-07 DIAGNOSIS — W3400XA Accidental discharge from unspecified firearms or gun, initial encounter: Principal | ICD-10-CM

## 2018-06-07 DIAGNOSIS — R079 Chest pain, unspecified: Secondary | ICD-10-CM | POA: Diagnosis not present

## 2018-06-07 DIAGNOSIS — M549 Dorsalgia, unspecified: Secondary | ICD-10-CM | POA: Diagnosis not present

## 2018-06-07 DIAGNOSIS — R0602 Shortness of breath: Secondary | ICD-10-CM | POA: Diagnosis not present

## 2018-06-08 ENCOUNTER — Other Ambulatory Visit: Payer: Self-pay

## 2018-06-08 ENCOUNTER — Encounter
Admit: 2018-06-08 | Discharge: 2018-06-08 | Disposition: A | Discharge status: Home / Self Care | Payer: BLUE CROSS/BLUE SHIELD

## 2018-06-09 ENCOUNTER — Other Ambulatory Visit: Payer: Self-pay

## 2018-06-09 ENCOUNTER — Encounter: Payer: Self-pay | Admitting: Gastroenterology

## 2018-06-09 ENCOUNTER — Ambulatory Visit: Payer: BLUE CROSS/BLUE SHIELD | Admitting: Gastroenterology

## 2018-06-09 VITALS — BP 124/73 | HR 69 | Ht 72.0 in | Wt 225.0 lb

## 2018-06-09 DIAGNOSIS — K580 Irritable bowel syndrome with diarrhea: Secondary | ICD-10-CM | POA: Diagnosis not present

## 2018-06-09 MED ORDER — RIFAXIMIN 550 MG PO TABS: 550.0000 mg | ORAL_TABLET | Freq: Two times a day (BID) | ORAL | 0 refills | Status: AC

## 2018-06-09 NOTE — Progress Notes (Signed)
Dale Bellows MD, MRCP(U.K) [address]  [place]  [place], [place] [address]  Main: [phone]  Fax: [phone]   Primary Care Physician: Arnetha Courser, MD  Primary Gastroenterologist:  Dr. Jonathon Kennedy   Chief Complaint  Patient presents with  . Follow-up    GERD    HPI: Dale Kennedy is a 43 y.o. male   Summary of history :  He was initially seen on 04/21/16 for abnormal LFT's, including a positive smooth muscle antibody . He also did have an iron deficiency anemia. Last few years has had an elevated alkaline phosphotase with normal transaminases .Fractionated components have been checked on two occasions show a mildly elevated liver component with normal bone components. GGT too has been mildly elevated in the past . RUQ USG shows no abnormality and normal bile ducts.  04/2016-MRCP normal  05/2016- Hb 13.1  HCV ab ,urine analysis , HIV ab ,celiac serology,HvsAgANA,AMA -negative Ceruloplasmin,A1AT - normal  F actin weakly positive 27  CRP elevated 2.6   05/2016  EGD- normal with duodenal biopsies  Colonoscopy -few ulcers seen on the ileo cecal valve - Normal TI biopsy IC valve showed mild acute inflammation -no chronic changes.Smooth muscle antibody has been elevated at 23 and 29 on two occasions when checked. 10/2016 : Capsule study normal  Seen by Dr Dale Kennedy in 12/2017 for abnormal elevated kappa light chains , microcytosis , placed on oral iron.  04/07/2018 : H pylori breath test - negative.   03/2018 : HB 13 grams  02/2018 : LFT's normal    Last visit had black stools and felt likely to pepto bismol ,also had gas ,bloating and slopping stools ? IBS-D Interval history 05/18/2018-06/09/2018 05/25/2018 : Hb stable at 12.6 grams . Black stool likely due to taking Peptobismol.  No more black stools , still has issues with gas,bloating , belching and sticky very soft stools, bowel movement helps  Off all artificial sweeteners   On plavix. Been on famotidine  20 mg .      Current Outpatient Medications  Medication Sig Dispense Refill  . ALPRAZolam (XANAX) 0.25 MG tablet Take 1 tablet (0.25 mg total) by mouth 2 (two) times daily as needed for anxiety. 14 tablet 0  . aspirin 81 MG tablet Take 81 mg by mouth daily.    . carvedilol (COREG) 6.25 MG tablet Take 2 tablets by mouth 2 (two) times daily.   11  . clopidogrel (PLAVIX) 75 MG tablet Take 1 tablet by mouth daily.    Marland Kitchen ezetimibe (ZETIA) 10 MG tablet TAKE 1 TABLET BY MOUTH EVERY MORNING FOR CHOLESTEROL 30 tablet 0  . famotidine (PEPCID) 40 MG tablet Take 1 tablet (40 mg total) by mouth 2 (two) times daily. 60 tablet 11  . fluticasone (FLONASE) 50 MCG/ACT nasal spray Place 2 sprays into both nostrils daily as needed. 16 g 11  . loratadine (CLARITIN) 10 MG tablet Take 1 tablet (10 mg total) by mouth daily as needed for allergies. 30 tablet 11  . nitroGLYCERIN (NITROSTAT) 0.4 MG SL tablet Place 1 tablet under the tongue as needed.  2  . pantoprazole (PROTONIX) 40 MG tablet Take 1 tablet (40 mg total) by mouth every other day. 45 tablet 0  . rosuvastatin (CRESTOR) 20 MG tablet Take 10 mg by mouth daily.  6  . sucralfate (CARAFATE) 1 GM/10ML suspension Take 10 mLs (1 g total) by mouth 4 (four) times daily. 420 mL 1  . sertraline (ZOLOFT) 50 MG tablet  One-half of a pill daily for four days, then one whole pill daily (Patient not taking: Reported on 04/29/2018) 30 tablet 0  . Simethicone (MYLANTA GAS MINIS) 41.667 MG CHEW Chew 1 tablet by mouth 3 (three) times daily as needed. (Patient not taking: Reported on 05/18/2018) 30 tablet 2   No current facility-administered medications for this visit.     Allergies as of 06/09/2018 - Review Complete 06/09/2018  Allergen Reaction Noted  . Ace inhibitors Swelling 12/24/2016    ROS:  General: Negative for anorexia, weight loss, fever, chills, fatigue, weakness. ENT: Negative for hoarseness, difficulty swallowing , nasal congestion. CV: Negative for chest  pain, angina, palpitations, dyspnea on exertion, peripheral edema.  Respiratory: Negative for dyspnea at rest, dyspnea on exertion, cough, sputum, wheezing.  GI: See history of present illness. GU:  Negative for dysuria, hematuria, urinary incontinence, urinary frequency, nocturnal urination.  Endo: Negative for unusual weight change.    Physical Examination:   BP 124/73   Pulse 69   Ht 6' (1.829 m)   Wt 225 lb (102.1 kg)   BMI 30.52 kg/m   General: Well-nourished, well-developed in no acute distress.  Eyes: No icterus. Conjunctivae pink. Mouth: Oropharyngeal mucosa moist and pink , no lesions erythema or exudate. Lungs: Clear to auscultation bilaterally. Non-labored. Heart: Regular rate and rhythm, no murmurs rubs or gallops.  Abdomen: Bowel sounds are normal, nontender, nondistended, no hepatosplenomegaly or masses, no abdominal bruits or hernia , no rebound or guarding.   Extremities: No lower extremity edema. No clubbing or deformities. Neuro: Alert and oriented x 3.  Grossly intact. Skin: Warm and dry, no jaundice.   Psych: Alert and cooperative, normal mood and affect.   Imaging Studies: No results found.  Assessment and Plan:   Dale Kennedy is a 43 y.o. y/o male  here to see me for GERD. Previously seen for microcytic iron def anemia which no cause was found on GI evaluation. Elevated alkaline phosphatase previously elevated has normalized. Follows with Dr Dale Kennedy for iron def anemia . Likely has IBS-D    1.Trial of Xifaxan for 2 weeks     Dr Dale Bellows  MD,MRCP [facility]) Follow up in 4 weeks

## 2018-06-16 ENCOUNTER — Encounter: Payer: Self-pay | Admitting: Gastroenterology

## 2018-06-22 MED ORDER — METOPROLOL SUCCINATE ER 25 MG TABLET,EXTENDED RELEASE 24 HR
ORAL_TABLET | Freq: Every day | ORAL | 6 refills | 0.00000 days | Status: CP
Start: 2018-06-22 — End: 2018-11-30

## 2018-06-23 ENCOUNTER — Encounter

## 2018-06-23 ENCOUNTER — Ambulatory Visit: Payer: BLUE CROSS/BLUE SHIELD | Admitting: Gastroenterology

## 2018-06-27 ENCOUNTER — Encounter: Payer: Self-pay | Admitting: Family Medicine

## 2018-07-06 DIAGNOSIS — R262 Difficulty in walking, not elsewhere classified: Secondary | ICD-10-CM | POA: Diagnosis not present

## 2018-07-06 DIAGNOSIS — G4733 Obstructive sleep apnea (adult) (pediatric): Secondary | ICD-10-CM | POA: Diagnosis not present

## 2018-07-06 DIAGNOSIS — M6281 Muscle weakness (generalized): Secondary | ICD-10-CM | POA: Diagnosis not present

## 2018-07-06 DIAGNOSIS — M25561 Pain in right knee: Secondary | ICD-10-CM | POA: Diagnosis not present

## 2018-07-11 ENCOUNTER — Encounter: Payer: Self-pay | Admitting: Family Medicine

## 2018-07-13 ENCOUNTER — Encounter: Payer: Self-pay | Admitting: Family Medicine

## 2018-07-18 ENCOUNTER — Telehealth: Payer: Self-pay | Admitting: Family Medicine

## 2018-07-18 ENCOUNTER — Ambulatory Visit (INDEPENDENT_AMBULATORY_CARE_PROVIDER_SITE_OTHER): Payer: BLUE CROSS/BLUE SHIELD | Admitting: Family Medicine

## 2018-07-18 ENCOUNTER — Other Ambulatory Visit: Payer: Self-pay

## 2018-07-18 ENCOUNTER — Encounter: Payer: Self-pay | Admitting: Family Medicine

## 2018-07-18 DIAGNOSIS — M25552 Pain in left hip: Secondary | ICD-10-CM

## 2018-07-18 DIAGNOSIS — R29898 Other symptoms and signs involving the musculoskeletal system: Secondary | ICD-10-CM

## 2018-07-18 DIAGNOSIS — M546 Pain in thoracic spine: Secondary | ICD-10-CM | POA: Diagnosis not present

## 2018-07-18 DIAGNOSIS — G8929 Other chronic pain: Secondary | ICD-10-CM

## 2018-07-18 DIAGNOSIS — E782 Mixed hyperlipidemia: Secondary | ICD-10-CM | POA: Diagnosis not present

## 2018-07-18 MED ORDER — HYDROCODONE-ACETAMINOPHEN 5-325 MG PO TABS: 0.5000 | ORAL_TABLET | Freq: Two times a day (BID) | ORAL | 0 refills | Status: DC | PRN
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
## 2018-07-18 NOTE — Assessment & Plan Note (Signed)
Patient wondering if pain may be related to statin, but I'm not inclined to stop his statin at this point; will get imaging; he'll get lipids checked soon

## 2018-07-18 NOTE — Telephone Encounter (Signed)
Please schedule patient for an appointment With whom: [name] When: 4 weeks How: VIDEO Why: pain management Thank you

## 2018-07-18 NOTE — Assessment & Plan Note (Signed)
Patient has tried to deal with his chronic hip pain for several months now off of the hydrocodone; he has not been able to manage his pain and would like to go back on; no red flags; my usual discussion about risks of unintentional overdose and death, especially if combined with alcohol or benzos; he promises to destroy the benzos and not take any more; he is to safeguard his medicine; out of the goodness of his heart, can NOT share with others even if someone breaks their arm, cannot be given to anyone else; cautioned about effects on driving, can cause DUI, do not drive if affects him; constipation may be a side effect, hydration, fiber, metamucil, colace, etc.; he'll start back on lowest effective dose; Rx to pharmacy; PMP Aware reviewed first; this is for chronic pain; after COVID-19 pandemic, will have him come in to th eoffice in person and fill out agreement and do UDS
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
## 2018-07-19 NOTE — Telephone Encounter (Signed)
DONE

## 2018-07-20 ENCOUNTER — Encounter: Admit: 2018-07-20 | Discharge: 2018-07-21 | Payer: BLUE CROSS/BLUE SHIELD

## 2018-07-20 DIAGNOSIS — F419 Anxiety disorder, unspecified: Secondary | ICD-10-CM

## 2018-07-20 DIAGNOSIS — I480 Paroxysmal atrial fibrillation: Secondary | ICD-10-CM

## 2018-07-20 DIAGNOSIS — I2510 Atherosclerotic heart disease of native coronary artery without angina pectoris: Principal | ICD-10-CM

## 2018-07-20 DIAGNOSIS — E785 Hyperlipidemia, unspecified: Secondary | ICD-10-CM

## 2018-07-21 ENCOUNTER — Other Ambulatory Visit: Payer: Self-pay

## 2018-07-21 ENCOUNTER — Ambulatory Visit (INDEPENDENT_AMBULATORY_CARE_PROVIDER_SITE_OTHER): Payer: BLUE CROSS/BLUE SHIELD | Admitting: Gastroenterology

## 2018-07-21 ENCOUNTER — Ambulatory Visit: Payer: BLUE CROSS/BLUE SHIELD | Admitting: Gastroenterology

## 2018-07-21 DIAGNOSIS — K580 Irritable bowel syndrome with diarrhea: Secondary | ICD-10-CM

## 2018-07-21 MED ORDER — RIFAXIMIN 550 MG PO TABS: 550.0000 mg | ORAL_TABLET | Freq: Two times a day (BID) | ORAL | 0 refills | Status: AC

## 2018-07-21 NOTE — Progress Notes (Signed)
Dale Kennedy , MD [address]  [place]  [place], [place] [address]  Main: [phone]  Fax: [phone]   Primary Care Physician: Arnetha Courser, MD  Virtual Visit via Video Note  I connected with patient on 07/21/18 at 10:30 AM EDT by video and verified that I am speaking with the correct person using two identifiers.   I discussed the limitations, risks, security and privacy concerns of performing an evaluation and management service by video  and the availability of in person appointments. I also discussed with the patient that there may be a patient responsible charge related to this service. The patient expressed understanding and agreed to proceed.  Location of Patient: Home Location of Provider: Home Persons involved: Patient and provider only   History of Present Illness: No chief complaint on file.   HPI: Dale Kennedy is a 43 y.o. male   Summary of history :  He was initially seen on 04/21/16 for abnormal LFT's, including a positive smooth muscle antibody . He also did have an iron deficiency anemia. Lastfewyears has had an elevated alkaline phosphotase with normal transaminases .Fractionated components have been checked on two occasions show a mildly elevated liver component with normal bone components. GGT too has been mildly elevated in the past . RUQ USG shows no abnormality and normal bile ducts.  04/2016-MRCP normal  05/2016- Hb 13.1  HCV ab ,urine analysis , HIV ab ,celiac serology,HvsAgANA,AMA -negative Ceruloplasmin,A1AT - normal  F actin weakly positive 27  CRP elevated 2.6  Smooth muscle antibody has been elevated at 23 and 29 on two occasions when checked. 04/07/2018 : H pylori breath test - negative.  03/2018 : HB 13 grams  02/2018 : LFT's normal  05/25/2018 : Hb stable at 12.6 grams . Black stool likely due to taking Peptobismol.    05/2016 EGD- normal with duodenal biopsies Colonoscopy -few ulcers seen on the ileo cecal valve -  Normal TI biopsy,IC valve showed mild acute inflammation -no chronic changes. 10/2016 : Capsule study normal   Seen by Dr Grayland Ormond in 9/2050for abnormal elevated kappa light chains , microcytosis , placed on oral iron.    Interval history 05/18/2018-06/09/2018  Feels a lot better after the course of antibiotics  Stools are better consistency , overall feels huis symptoms are 80% better. No artificial sugars.  On plavix.On famotidine 20 mg .  Current Outpatient Medications  Medication Sig Dispense Refill  . aspirin 81 MG tablet Take 81 mg by mouth daily.    . clopidogrel (PLAVIX) 75 MG tablet Take 1 tablet by mouth daily.    Marland Kitchen ezetimibe (ZETIA) 10 MG tablet TAKE 1 TABLET BY MOUTH EVERY MORNING FOR CHOLESTEROL 30 tablet 0  . famotidine (PEPCID) 40 MG tablet Take 1 tablet (40 mg total) by mouth 2 (two) times daily. 60 tablet 11  . fluticasone (FLONASE) 50 MCG/ACT nasal spray Place 2 sprays into both nostrils daily as needed. 16 g 11  . HYDROcodone-acetaminophen (NORCO/VICODIN) 5-325 MG tablet Take 0.5-1 tablets by mouth 2 (two) times daily as needed for moderate pain. Max two pills per day 30 tablet 0  . loratadine (CLARITIN) 10 MG tablet Take 1 tablet (10 mg total) by mouth daily as needed for allergies. 30 tablet 11  . metoprolol succinate (TOPROL-XL) 25 MG 24 hr tablet Take 1 tablet by mouth daily.    . nitroGLYCERIN (NITROSTAT) 0.4 MG SL tablet Place 1 tablet under the tongue as needed.  2  . pantoprazole (  PROTONIX) 40 MG tablet Take 1 tablet (40 mg total) by mouth every other day. 45 tablet 0  . rosuvastatin (CRESTOR) 20 MG tablet Take 10 mg by mouth daily.  6  . Simethicone (MYLANTA GAS MINIS) 41.667 MG CHEW Chew 1 tablet by mouth 3 (three) times daily as needed. 30 tablet 2  . sucralfate (CARAFATE) 1 GM/10ML suspension Take 10 mLs (1 g total) by mouth 4 (four) times daily. 420 mL 1  . carvedilol (COREG) 6.25 MG tablet Take 2 tablets by mouth 2 (two) times daily.   11   No current  facility-administered medications for this visit.     Allergies as of 07/21/2018 - Review Complete 07/21/2018  Allergen Reaction Noted  . Ace inhibitors Swelling 12/24/2016    Review of Systems:    All systems reviewed and negative except where noted in HPI.  General Appearance:    Alert, cooperative, no distress, appears stated age  Head:    Normocephalic, without obvious abnormality, atraumatic  Eyes:    PERRL, conjunctiva/corneas clear,  Ears:    Grossly normal hearing    Neurologic:  Grossly normal    Observations/Objective:  Labs: CMP     Component Value Date/Time   NA 140 05/18/2018 0900   NA 136 07/22/2013 0823   K 4.9 05/18/2018 0900   K 3.9 07/22/2013 0823   CL 101 05/18/2018 0900   CL 101 07/22/2013 0823   CO2 23 05/18/2018 0900   CO2 28 07/22/2013 0823   GLUCOSE 82 05/18/2018 0900   GLUCOSE 113 (H) 04/01/2018 1401   GLUCOSE 110 (H) 07/22/2013 0823   BUN 12 05/18/2018 0900   BUN 12 07/22/2013 0823   CREATININE 1.18 05/18/2018 0900   CREATININE 1.10 10/14/2016 0935   CALCIUM 9.4 05/18/2018 0900   CALCIUM 9.3 07/22/2013 0823   PROT 8.2 05/18/2018 0900   PROT 9.3 (H) 07/22/2013 0823   ALBUMIN 4.6 05/18/2018 0900   ALBUMIN 3.6 07/22/2013 0823   AST 20 05/18/2018 0900   AST 17 07/22/2013 0823   ALT 34 05/18/2018 0900   ALT 51 07/22/2013 0823   ALKPHOS 147 (H) 05/18/2018 0900   ALKPHOS 148 (H) 07/22/2013 0823   BILITOT 0.3 05/18/2018 0900   BILITOT 0.3 07/22/2013 0823   GFRNONAA 76 05/18/2018 0900   GFRNONAA 84 10/14/2016 0935   GFRAA 88 05/18/2018 0900   GFRAA >89 10/14/2016 0935   Lab Results  Component Value Date   WBC 6.7 05/25/2018   HGB 12.6 (L) 05/25/2018   HCT 41.6 05/25/2018   MCV 74 (L) 05/25/2018   PLT 371 05/25/2018    Imaging Studies: No results found.  Assessment and Plan:   Dale Kennedy is a 43 y.o. 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 Previously seen for microcytic iron def anemia which no cause was found on GI evaluation.  Elevated alkaline phosphatase previously elevated has normalized. Follows with Dr Grayland Ormond for iron def anemia . After course of Xigfaxan feeling significantly better. He is 80% better and would like to extend another 2 weeks of xifaxan   1. Extend Xifaxan for 2 weeks      I discussed the assessment and treatment plan with the patient. The patient was provided an opportunity to ask questions and all were answered. The patient agreed with the plan and demonstrated an understanding of the instructions.   The patient was advised to call back or seek an in-person evaluation if the symptoms worsen or if the condition fails  to improve as anticipated.  Dr Dale Bellows MD,MRCP [facility]) Gastroenterology/Hepatology Pager: [phone]   Speech recognition software was used to dictate this note.

## 2018-07-21 NOTE — Addendum Note (Signed)
Addended by: Dorethea Clan on: 07/21/2018 10:37 AM   Modules accepted: Orders

## 2018-07-22 ENCOUNTER — Other Ambulatory Visit: Payer: Self-pay | Admitting: Family Medicine

## 2018-07-22 ENCOUNTER — Ambulatory Visit: Payer: BLUE CROSS/BLUE SHIELD | Admitting: Oncology

## 2018-07-22 ENCOUNTER — Ambulatory Visit: Payer: Self-pay | Admitting: *Deleted

## 2018-07-22 ENCOUNTER — Other Ambulatory Visit: Payer: BLUE CROSS/BLUE SHIELD

## 2018-07-22 ENCOUNTER — Telehealth: Payer: Self-pay | Admitting: Nurse Practitioner

## 2018-07-22 ENCOUNTER — Ambulatory Visit (INDEPENDENT_AMBULATORY_CARE_PROVIDER_SITE_OTHER): Payer: BLUE CROSS/BLUE SHIELD | Admitting: Nurse Practitioner

## 2018-07-22 ENCOUNTER — Inpatient Hospital Stay: Payer: BLUE CROSS/BLUE SHIELD | Attending: Oncology

## 2018-07-22 ENCOUNTER — Encounter: Payer: Self-pay | Admitting: Family Medicine

## 2018-07-22 ENCOUNTER — Encounter: Payer: Self-pay | Admitting: Nurse Practitioner

## 2018-07-22 ENCOUNTER — Other Ambulatory Visit: Payer: Self-pay

## 2018-07-22 VITALS — Temp 98.4°F

## 2018-07-22 DIAGNOSIS — J988 Other specified respiratory disorders: Secondary | ICD-10-CM

## 2018-07-22 DIAGNOSIS — B9789 Other viral agents as the cause of diseases classified elsewhere: Secondary | ICD-10-CM

## 2018-07-22 DIAGNOSIS — R718 Other abnormality of red blood cells: Secondary | ICD-10-CM | POA: Diagnosis not present

## 2018-07-22 DIAGNOSIS — I252 Old myocardial infarction: Secondary | ICD-10-CM | POA: Diagnosis not present

## 2018-07-22 DIAGNOSIS — R778 Other specified abnormalities of plasma proteins: Secondary | ICD-10-CM | POA: Insufficient documentation

## 2018-07-22 DIAGNOSIS — J4520 Mild intermittent asthma, uncomplicated: Secondary | ICD-10-CM | POA: Diagnosis not present

## 2018-07-22 DIAGNOSIS — Z955 Presence of coronary angioplasty implant and graft: Secondary | ICD-10-CM | POA: Insufficient documentation

## 2018-07-22 LAB — CBC WITH DIFFERENTIAL/PLATELET
Abs Immature Granulocytes: 0.03 10*3/uL (ref 0.00–0.07)
Basophils Absolute: 0 10*3/uL (ref 0.0–0.1)
Basophils Relative: 1 %
Eosinophils Absolute: 0.1 10*3/uL (ref 0.0–0.5)
Eosinophils Relative: 1 %
HCT: 42.4 % (ref 39.0–52.0)
Hemoglobin: 12.7 g/dL — ABNORMAL LOW (ref 13.0–17.0)
Immature Granulocytes: 1 %
Lymphocytes Relative: 16 %
Lymphs Abs: 1 10*3/uL (ref 0.7–4.0)
MCH: 21.9 pg — ABNORMAL LOW (ref 26.0–34.0)
MCHC: 30 g/dL (ref 30.0–36.0)
MCV: 73.1 fL — ABNORMAL LOW (ref 80.0–100.0)
Monocytes Absolute: 0.6 10*3/uL (ref 0.1–1.0)
Monocytes Relative: 9 %
Neutro Abs: 4.5 10*3/uL (ref 1.7–7.7)
Neutrophils Relative %: 72 %
Platelets: 384 10*3/uL (ref 150–400)
RBC: 5.8 MIL/uL (ref 4.22–5.81)
RDW: 17.1 % — ABNORMAL HIGH (ref 11.5–15.5)
WBC: 6.2 10*3/uL (ref 4.0–10.5)
nRBC: 0 % (ref 0.0–0.2)

## 2018-07-22 LAB — COMPREHENSIVE METABOLIC PANEL
ALT: 54 U/L — ABNORMAL HIGH (ref 0–44)
AST: 30 U/L (ref 15–41)
Albumin: 4.1 g/dL (ref 3.5–5.0)
Alkaline Phosphatase: 145 U/L — ABNORMAL HIGH (ref 38–126)
Anion gap: 7 (ref 5–15)
BUN: 16 mg/dL (ref 6–20)
CO2: 24 mmol/L (ref 22–32)
Calcium: 8.9 mg/dL (ref 8.9–10.3)
Chloride: 105 mmol/L (ref 98–111)
Creatinine, Ser: 1.18 mg/dL (ref 0.61–1.24)
GFR calc Af Amer: >60 mL/min (ref >60)
GFR calc non Af Amer: >60 mL/min (ref >60)
Glucose, Bld: 104 mg/dL — ABNORMAL HIGH (ref 70–99)
Potassium: 4.2 mmol/L (ref 3.5–5.1)
Sodium: 136 mmol/L (ref 135–145)
Total Bilirubin: 0.3 mg/dL (ref 0.3–1.2)
Total Protein: 9.3 g/dL — ABNORMAL HIGH (ref 6.5–8.1)

## 2018-07-22 LAB — LIPID PANEL
Cholesterol: 124 mg/dL (ref 0–200)
HDL: 36 mg/dL — ABNORMAL LOW (ref >40)
LDL Cholesterol: 76 mg/dL (ref 0–99)
Total CHOL/HDL Ratio: 3.4 RATIO
Triglycerides: 58 mg/dL (ref <150)
VLDL: 12 mg/dL (ref 0–40)

## 2018-07-22 MED ORDER — BENZONATATE 100 MG PO CAPS: 200.0000 mg | ORAL_CAPSULE | Freq: Three times a day (TID) | ORAL | 0 refills | Status: DC | PRN

## 2018-07-22 MED ORDER — DM-GUAIFENESIN ER 30-600 MG PO TB12: 1.0000 | ORAL_TABLET | Freq: Two times a day (BID) | ORAL | 1 refills | Status: DC

## 2018-07-22 MED ORDER — ALBUTEROL SULFATE HFA 108 (90 BASE) MCG/ACT IN AERS: 2.0000 | INHALATION_SPRAY | Freq: Four times a day (QID) | RESPIRATORY_TRACT | 0 refills | Status: DC | PRN

## 2018-07-22 NOTE — Telephone Encounter (Signed)
Patient is calling with concerns of cough- patient states he does feel like he has some respiratory symptoms- feels he has trouble getting a deep breath. Call to office- they are going to schedule a virtual visit for him.  Reason for Disposition . MILD difficulty breathing (e.g., minimal/no SOB at rest, SOB with walking, pulse <100)  Answer Assessment - Initial Assessment Questions 1. ONSET: "When did the cough begin?"      Started last night 2. SEVERITY: "How bad is the cough today?"      Dry cough- some phlegm- coughing often  3. RESPIRATORY DISTRESS: "Describe your breathing."      Feels like he is not getting enough air in with a deep breath 4. FEVER: "Do you have a fever?" If so, ask: "What is your temperature, how was it measured, and when did it start?"     no 5. SPUTUM: "Describe the color of your sputum" (clear, white, yellow, green)     clear 6. HEMOPTYSIS: "Are you coughing up any blood?" If so ask: "How much?" (flecks, streaks, tablespoons, etc.)     No blood 7. CARDIAC HISTORY: "Do you have any history of heart disease?" (e.g., heart attack, congestive heart failure)      Heart attack history 8. LUNG HISTORY: "Do you have any history of lung disease?"  (e.g., pulmonary embolus, asthma, emphysema)     no 9. PE RISK FACTORS: "Do you have a history of blood clots?" (or: recent major surgery, recent prolonged travel, bedridden)     no 10. OTHER SYMPTOMS: "Do you have any other symptoms?" (e.g., runny nose, wheezing, chest pain)       Feels hoarse,runny nose 11. PREGNANCY: "Is there any chance you are pregnant?" "When was your last menstrual period?"       n/a 12. TRAVEL: "Have you traveled out of the country in the last month?" (e.g., travel history, exposures)       No travel, no exposure  Protocols used: CORONAVIRUS (COVID-19) DIAGNOSED OR SUSPECTED-A-AH, COUGH - ACUTE PRODUCTIVE-A-AH
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
## 2018-07-22 NOTE — Telephone Encounter (Signed)
Copied from [place] [id number]. Topic: Quick Communication - Rx Refill/Question >> Jul 22, 2018  3:35 PM [place], [place] D wrote: Medication: dextromethorphan-guaiFENesin (MUCINEX DM) 30-600 MG 12hr tablet / The pharmacy doesn't have 30-600MG  but they do have maximum strength that pt could take once a day. They want to know if this is okay. Please advise.  Has the patient contacted their pharmacy? Yes.   (Agent: If no, request that the patient contact the pharmacy for the refill.) (Agent: If yes, when and what did the pharmacy advise?)  Preferred Pharmacy (with phone number or street name): [place], [place] - [address] [facility] [phone] (Phone) [phone] (Fax)  Agent: Please be advised that RX refills may take up to 3 business days. We ask that you follow-up with your pharmacy.

## 2018-07-23 LAB — IGG, IGA, IGM
IgA: 368 mg/dL (ref 90–386)
IgG (Immunoglobin G), Serum: 2323 mg/dL — ABNORMAL HIGH (ref 603–1613)
IgM (Immunoglobulin M), Srm: 159 mg/dL (ref 20–172)

## 2018-07-25 LAB — PROTEIN ELECTROPHORESIS, SERUM
A/G Ratio: 0.7 (ref 0.7–1.7)
Albumin ELP: 3.5 g/dL (ref 2.9–4.4)
Alpha-1-Globulin: 0.3 g/dL (ref 0.0–0.4)
Alpha-2-Globulin: 0.7 g/dL (ref 0.4–1.0)
Beta Globulin: 1.3 g/dL (ref 0.7–1.3)
Gamma Globulin: 2.3 g/dL — ABNORMAL HIGH (ref 0.4–1.8)
Globulin, Total: 4.7 g/dL — ABNORMAL HIGH (ref 2.2–3.9)
Total Protein ELP: 8.2 g/dL (ref 6.0–8.5)

## 2018-07-25 LAB — KAPPA/LAMBDA LIGHT CHAINS
Kappa free light chain: 57.1 mg/L — ABNORMAL HIGH (ref 3.3–19.4)
Kappa, lambda light chain ratio: 2 — ABNORMAL HIGH (ref 0.26–1.65)
Lambda free light chains: 28.6 mg/L — ABNORMAL HIGH (ref 5.7–26.3)

## 2018-07-25 NOTE — Telephone Encounter (Signed)
Can switch to max strength

## 2018-07-25 NOTE — Telephone Encounter (Signed)
Pharmacy has been informed via voicemail.

## 2018-07-26 MED ORDER — AMOXICILLIN-POT CLAVULANATE 875-125 MG PO TABS: 1.0000 | ORAL_TABLET | Freq: Two times a day (BID) | ORAL | 0 refills | Status: DC

## 2018-07-26 NOTE — Addendum Note (Signed)
Addended by: LADA, Satira Anis on: 07/26/2018 08:32 AM   Modules accepted: Orders

## 2018-07-26 NOTE — Telephone Encounter (Signed)
I sent a message to not address urgent issues over MyChart  I called patient; he was coughing last night Checked BP, 175/97 He thinks he has a cold He is coughing up green stuff He did a video visit the other day and he has been taking mucinex He actually saw someone for a video visit with E. Poulose, DNP on 07/22/2018 I asked about his main concern and he says it is his BP 156/99 BP and pulse 74 He feels like he needs an antibiotic; no fever; not getting any better; coughing frequently He was screened for COVID-19 last visit and sounded URI He says BP is normally fine; he thinks the infection is the cause of the high BP I offered to change BP med but then realized this is managed by cardiologist His med list has toprol and coreg; he confirms not on coreg any more I suggested he call cardiologist and see if they will increase toprol from 25 mg daily to 50 mg daily Avoid decongestants Monitor pulse and BP daily for the next week and contact cardiologist if not being controlled Go to ER , call 911 if any chest GI has him on H2 blocker and PPI Always follow package directions for OTC medicine Phone call 11 minutes 23 seconds

## 2018-07-28 ENCOUNTER — Encounter: Payer: Self-pay | Admitting: Nurse Practitioner

## 2018-07-28 ENCOUNTER — Ambulatory Visit (INDEPENDENT_AMBULATORY_CARE_PROVIDER_SITE_OTHER): Payer: BLUE CROSS/BLUE SHIELD | Admitting: Nurse Practitioner

## 2018-07-28 ENCOUNTER — Other Ambulatory Visit: Payer: Self-pay

## 2018-07-28 VITALS — BP 138/86 | HR 63 | Temp 97.1°F | Ht 72.0 in | Wt 225.0 lb

## 2018-07-28 DIAGNOSIS — E782 Mixed hyperlipidemia: Secondary | ICD-10-CM

## 2018-07-28 DIAGNOSIS — R059 Cough, unspecified: Secondary | ICD-10-CM

## 2018-07-28 DIAGNOSIS — R05 Cough: Secondary | ICD-10-CM

## 2018-07-28 DIAGNOSIS — R062 Wheezing: Secondary | ICD-10-CM

## 2018-07-28 DIAGNOSIS — I10 Essential (primary) hypertension: Secondary | ICD-10-CM | POA: Diagnosis not present
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
## 2018-07-29 ENCOUNTER — Ambulatory Visit: Payer: BLUE CROSS/BLUE SHIELD | Admitting: Family Medicine

## 2018-08-02 ENCOUNTER — Telehealth: Payer: Self-pay | Admitting: Nurse Practitioner

## 2018-08-02 ENCOUNTER — Telehealth: Payer: Self-pay | Admitting: Family Medicine

## 2018-08-02 ENCOUNTER — Other Ambulatory Visit: Payer: Self-pay

## 2018-08-02 NOTE — Telephone Encounter (Signed)
Copied from [place] [id number] [id number]. Topic: Quick Communication - See Telephone Encounter >> Aug 02, 2018  8:49 AM Valla Leaver wrote: CRM for notification. See Telephone encounter for: 08/02/18. Patient needs a note for work to get back to work because he missed work Sunday and Monday because he still wasn't feeling better. He's trying to return to work today but his job requested the clearance documentation.

## 2018-08-02 NOTE — Telephone Encounter (Signed)
If he has been fever free for 72 hours and cough and shortness of breath have improved please send return to work note

## 2018-08-02 NOTE — Telephone Encounter (Signed)
error 

## 2018-08-05 DIAGNOSIS — R262 Difficulty in walking, not elsewhere classified: Secondary | ICD-10-CM | POA: Diagnosis not present

## 2018-08-05 DIAGNOSIS — G4733 Obstructive sleep apnea (adult) (pediatric): Secondary | ICD-10-CM | POA: Diagnosis not present

## 2018-08-05 DIAGNOSIS — M6281 Muscle weakness (generalized): Secondary | ICD-10-CM | POA: Diagnosis not present

## 2018-08-05 DIAGNOSIS — M25561 Pain in right knee: Secondary | ICD-10-CM | POA: Diagnosis not present

## 2018-08-16 ENCOUNTER — Encounter: Payer: Self-pay | Admitting: Family Medicine

## 2018-08-16 ENCOUNTER — Ambulatory Visit (INDEPENDENT_AMBULATORY_CARE_PROVIDER_SITE_OTHER): Payer: BLUE CROSS/BLUE SHIELD | Admitting: Nurse Practitioner

## 2018-08-16 ENCOUNTER — Other Ambulatory Visit: Payer: Self-pay

## 2018-08-16 ENCOUNTER — Encounter: Payer: Self-pay | Admitting: Nurse Practitioner

## 2018-08-16 VITALS — BP 130/82 | HR 60 | Resp 16

## 2018-08-16 DIAGNOSIS — J4530 Mild persistent asthma, uncomplicated: Secondary | ICD-10-CM

## 2018-08-16 DIAGNOSIS — I10 Essential (primary) hypertension: Secondary | ICD-10-CM | POA: Diagnosis not present

## 2018-08-16 DIAGNOSIS — I480 Paroxysmal atrial fibrillation: Secondary | ICD-10-CM

## 2018-08-16 DIAGNOSIS — R52 Pain, unspecified: Secondary | ICD-10-CM

## 2018-08-16 DIAGNOSIS — E782 Mixed hyperlipidemia: Secondary | ICD-10-CM | POA: Diagnosis not present

## 2018-08-16 DIAGNOSIS — G8929 Other chronic pain: Secondary | ICD-10-CM

## 2018-08-16 DIAGNOSIS — M6289 Other specified disorders of muscle: Secondary | ICD-10-CM

## 2018-08-16 DIAGNOSIS — K219 Gastro-esophageal reflux disease without esophagitis: Secondary | ICD-10-CM

## 2018-08-16 MED ORDER — HYDROCODONE-ACETAMINOPHEN 5-325 MG PO TABS: 0.5000 | ORAL_TABLET | Freq: Two times a day (BID) | ORAL | 0 refills | Status: DC | PRN

## 2018-08-16 MED ORDER — FLUTICASONE PROPIONATE (INHAL) 50 MCG/BLIST IN AEPB: 1.0000 | INHALATION_SPRAY | Freq: Two times a day (BID) | RESPIRATORY_TRACT | 0 refills | Status: DC

## 2018-08-16 MED ORDER — TIZANIDINE HCL 2 MG PO CAPS: 2.0000 mg | ORAL_CAPSULE | Freq: Every evening | ORAL | 1 refills | Status: DC | PRN
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
## 2018-08-17 ENCOUNTER — Other Ambulatory Visit: Payer: Self-pay | Admitting: Nurse Practitioner

## 2018-08-17 DIAGNOSIS — J4530 Mild persistent asthma, uncomplicated: Secondary | ICD-10-CM

## 2018-08-17 MED ORDER — BUDESONIDE 90 MCG/ACT IN AEPB: 1.0000 | INHALATION_SPRAY | Freq: Two times a day (BID) | RESPIRATORY_TRACT | 3 refills | Status: DC

## 2018-08-18 ENCOUNTER — Ambulatory Visit
Admit: 2018-08-18 | Discharge: 2018-08-18 | Disposition: A | Discharge status: Home / Self Care | Payer: BLUE CROSS/BLUE SHIELD

## 2018-08-18 ENCOUNTER — Encounter
Admit: 2018-08-18 | Discharge: 2018-08-18 | Disposition: A | Discharge status: Home / Self Care | Payer: BLUE CROSS/BLUE SHIELD

## 2018-08-18 DIAGNOSIS — R0602 Shortness of breath: Secondary | ICD-10-CM

## 2018-08-18 DIAGNOSIS — R079 Chest pain, unspecified: Principal | ICD-10-CM

## 2018-08-18 DIAGNOSIS — I10 Essential (primary) hypertension: Secondary | ICD-10-CM | POA: Diagnosis not present

## 2018-08-18 DIAGNOSIS — I2510 Atherosclerotic heart disease of native coronary artery without angina pectoris: Secondary | ICD-10-CM | POA: Diagnosis not present

## 2018-08-18 DIAGNOSIS — E785 Hyperlipidemia, unspecified: Secondary | ICD-10-CM | POA: Diagnosis not present

## 2018-08-18 DIAGNOSIS — I4891 Unspecified atrial fibrillation: Secondary | ICD-10-CM | POA: Diagnosis not present

## 2018-08-18 DIAGNOSIS — K219 Gastro-esophageal reflux disease without esophagitis: Secondary | ICD-10-CM | POA: Diagnosis not present

## 2018-08-18 DIAGNOSIS — G4733 Obstructive sleep apnea (adult) (pediatric): Secondary | ICD-10-CM | POA: Diagnosis not present

## 2018-08-18 DIAGNOSIS — Z79899 Other long term (current) drug therapy: Secondary | ICD-10-CM | POA: Diagnosis not present

## 2018-08-18 DIAGNOSIS — Z955 Presence of coronary angioplasty implant and graft: Secondary | ICD-10-CM | POA: Diagnosis not present

## 2018-08-18 DIAGNOSIS — Z7902 Long term (current) use of antithrombotics/antiplatelets: Secondary | ICD-10-CM | POA: Diagnosis not present

## 2018-08-18 DIAGNOSIS — R0789 Other chest pain: Secondary | ICD-10-CM | POA: Diagnosis not present

## 2018-08-18 DIAGNOSIS — R072 Precordial pain: Secondary | ICD-10-CM | POA: Diagnosis not present

## 2018-08-18 DIAGNOSIS — Z7982 Long term (current) use of aspirin: Secondary | ICD-10-CM | POA: Diagnosis not present

## 2018-08-18 DIAGNOSIS — F419 Anxiety disorder, unspecified: Secondary | ICD-10-CM | POA: Diagnosis not present

## 2018-08-18 DIAGNOSIS — Z87891 Personal history of nicotine dependence: Secondary | ICD-10-CM | POA: Diagnosis not present

## 2018-08-26 ENCOUNTER — Encounter: Admit: 2018-08-26 | Discharge: 2018-08-27 | Payer: BLUE CROSS/BLUE SHIELD

## 2018-08-26 DIAGNOSIS — I2510 Atherosclerotic heart disease of native coronary artery without angina pectoris: Principal | ICD-10-CM

## 2018-08-26 DIAGNOSIS — R079 Chest pain, unspecified: Secondary | ICD-10-CM

## 2018-08-30 ENCOUNTER — Observation Stay (HOSPITAL_COMMUNITY): Payer: BLUE CROSS/BLUE SHIELD

## 2018-08-30 ENCOUNTER — Encounter (HOSPITAL_COMMUNITY): Payer: Self-pay

## 2018-08-30 ENCOUNTER — Observation Stay (HOSPITAL_COMMUNITY)
Admission: EM | Admit: 2018-08-30 | Discharge: 2018-08-30 | Disposition: A | Discharge status: Home / Self Care | Payer: BLUE CROSS/BLUE SHIELD | Attending: Internal Medicine | Admitting: Internal Medicine

## 2018-08-30 ENCOUNTER — Other Ambulatory Visit: Payer: Self-pay

## 2018-08-30 ENCOUNTER — Emergency Department (HOSPITAL_COMMUNITY): Payer: BLUE CROSS/BLUE SHIELD

## 2018-08-30 DIAGNOSIS — I509 Heart failure, unspecified: Secondary | ICD-10-CM | POA: Insufficient documentation

## 2018-08-30 DIAGNOSIS — R0789 Other chest pain: Secondary | ICD-10-CM | POA: Diagnosis not present

## 2018-08-30 DIAGNOSIS — Z20828 Contact with and (suspected) exposure to other viral communicable diseases: Secondary | ICD-10-CM | POA: Diagnosis not present

## 2018-08-30 DIAGNOSIS — I2510 Atherosclerotic heart disease of native coronary artery without angina pectoris: Secondary | ICD-10-CM | POA: Diagnosis not present

## 2018-08-30 DIAGNOSIS — Z8249 Family history of ischemic heart disease and other diseases of the circulatory system: Secondary | ICD-10-CM | POA: Insufficient documentation

## 2018-08-30 DIAGNOSIS — D509 Iron deficiency anemia, unspecified: Secondary | ICD-10-CM | POA: Insufficient documentation

## 2018-08-30 DIAGNOSIS — Z79899 Other long term (current) drug therapy: Secondary | ICD-10-CM | POA: Insufficient documentation

## 2018-08-30 DIAGNOSIS — Z87891 Personal history of nicotine dependence: Secondary | ICD-10-CM | POA: Insufficient documentation

## 2018-08-30 DIAGNOSIS — K219 Gastro-esophageal reflux disease without esophagitis: Secondary | ICD-10-CM | POA: Insufficient documentation

## 2018-08-30 DIAGNOSIS — I10 Essential (primary) hypertension: Secondary | ICD-10-CM | POA: Diagnosis not present

## 2018-08-30 DIAGNOSIS — Z7982 Long term (current) use of aspirin: Secondary | ICD-10-CM | POA: Diagnosis not present

## 2018-08-30 DIAGNOSIS — Z6837 Body mass index (BMI) 37.0-37.9, adult: Secondary | ICD-10-CM | POA: Insufficient documentation

## 2018-08-30 DIAGNOSIS — Z955 Presence of coronary angioplasty implant and graft: Secondary | ICD-10-CM | POA: Diagnosis not present

## 2018-08-30 DIAGNOSIS — R079 Chest pain, unspecified: Secondary | ICD-10-CM | POA: Diagnosis not present

## 2018-08-30 DIAGNOSIS — J45909 Unspecified asthma, uncomplicated: Secondary | ICD-10-CM | POA: Insufficient documentation

## 2018-08-30 DIAGNOSIS — I25119 Atherosclerotic heart disease of native coronary artery with unspecified angina pectoris: Secondary | ICD-10-CM

## 2018-08-30 DIAGNOSIS — R7989 Other specified abnormal findings of blood chemistry: Secondary | ICD-10-CM

## 2018-08-30 DIAGNOSIS — Z888 Allergy status to other drugs, medicaments and biological substances status: Secondary | ICD-10-CM | POA: Insufficient documentation

## 2018-08-30 DIAGNOSIS — E785 Hyperlipidemia, unspecified: Secondary | ICD-10-CM | POA: Insufficient documentation

## 2018-08-30 DIAGNOSIS — E669 Obesity, unspecified: Secondary | ICD-10-CM | POA: Insufficient documentation

## 2018-08-30 DIAGNOSIS — D473 Essential (hemorrhagic) thrombocythemia: Secondary | ICD-10-CM | POA: Insufficient documentation

## 2018-08-30 DIAGNOSIS — Z7902 Long term (current) use of antithrombotics/antiplatelets: Secondary | ICD-10-CM | POA: Insufficient documentation

## 2018-08-30 DIAGNOSIS — I110 Hypertensive heart disease with heart failure: Secondary | ICD-10-CM | POA: Diagnosis not present

## 2018-08-30 DIAGNOSIS — E782 Mixed hyperlipidemia: Secondary | ICD-10-CM

## 2018-08-30 DIAGNOSIS — I480 Paroxysmal atrial fibrillation: Secondary | ICD-10-CM | POA: Diagnosis not present

## 2018-08-30 DIAGNOSIS — M79662 Pain in left lower leg: Secondary | ICD-10-CM | POA: Insufficient documentation

## 2018-08-30 DIAGNOSIS — D75839 Thrombocytosis, unspecified: Secondary | ICD-10-CM | POA: Diagnosis present

## 2018-08-30 LAB — CBC WITH DIFFERENTIAL/PLATELET
Abs Immature Granulocytes: 0.02 10*3/uL (ref 0.00–0.07)
Basophils Absolute: 0.1 10*3/uL (ref 0.0–0.1)
Basophils Relative: 1 %
Eosinophils Absolute: 0.1 10*3/uL (ref 0.0–0.5)
Eosinophils Relative: 1 %
HCT: 41.3 % (ref 39.0–52.0)
Hemoglobin: 11.9 g/dL — ABNORMAL LOW (ref 13.0–17.0)
Immature Granulocytes: 0 %
Lymphocytes Relative: 22 %
Lymphs Abs: 1.2 10*3/uL (ref 0.7–4.0)
MCH: 21.3 pg — ABNORMAL LOW (ref 26.0–34.0)
MCHC: 28.8 g/dL — ABNORMAL LOW (ref 30.0–36.0)
MCV: 73.8 fL — ABNORMAL LOW (ref 80.0–100.0)
Monocytes Absolute: 0.5 10*3/uL (ref 0.1–1.0)
Monocytes Relative: 9 %
Neutro Abs: 3.7 10*3/uL (ref 1.7–7.7)
Neutrophils Relative %: 67 %
Platelets: 402 10*3/uL — ABNORMAL HIGH (ref 150–400)
RBC: 5.6 MIL/uL (ref 4.22–5.81)
RDW: 17.6 % — ABNORMAL HIGH (ref 11.5–15.5)
WBC: 5.6 10*3/uL (ref 4.0–10.5)
nRBC: 0 % (ref 0.0–0.2)

## 2018-08-30 LAB — TROPONIN I
Troponin I: <0.03 ng/mL (ref <0.03)
Troponin I: <0.03 ng/mL (ref <0.03)
Troponin I: <0.03 ng/mL (ref <0.03)
Troponin I: <0.03 ng/mL (ref <0.03)

## 2018-08-30 LAB — BASIC METABOLIC PANEL
Anion gap: 11 (ref 5–15)
BUN: 11 mg/dL (ref 6–20)
CO2: 24 mmol/L (ref 22–32)
Calcium: 9.2 mg/dL (ref 8.9–10.3)
Chloride: 103 mmol/L (ref 98–111)
Creatinine, Ser: 1.04 mg/dL (ref 0.61–1.24)
GFR calc Af Amer: >60 mL/min (ref >60)
GFR calc non Af Amer: >60 mL/min (ref >60)
Glucose, Bld: 98 mg/dL (ref 70–99)
Potassium: 4.5 mmol/L (ref 3.5–5.1)
Sodium: 138 mmol/L (ref 135–145)

## 2018-08-30 LAB — SARS CORONAVIRUS 2 BY RT PCR (HOSPITAL ORDER, PERFORMED IN ~~LOC~~ HOSPITAL LAB): SARS Coronavirus 2: NEGATIVE

## 2018-08-30 LAB — D-DIMER, QUANTITATIVE: D-Dimer, Quant: 0.76 ug/mL-FEU — ABNORMAL HIGH (ref 0.00–0.50)

## 2018-08-30 MED ORDER — ACETAMINOPHEN 325 MG PO TABS
650.0000 mg | ORAL_TABLET | ORAL | Status: DC | PRN
  Administered 2018-08-30: 18:00:00 650 mg via ORAL
  Filled 2018-08-30: qty 2

## 2018-08-30 MED ORDER — EZETIMIBE 10 MG PO TABS
10.0000 mg | ORAL_TABLET | Freq: Every day | ORAL | Status: DC
  Administered 2018-08-30: 10 mg via ORAL
  Filled 2018-08-30: qty 1

## 2018-08-30 MED ORDER — ASPIRIN 81 MG PO CHEW
81.0000 mg | CHEWABLE_TABLET | Freq: Every day | ORAL | Status: DC
  Administered 2018-08-30: 81 mg via ORAL
  Filled 2018-08-30: qty 1

## 2018-08-30 MED ORDER — PANTOPRAZOLE SODIUM 40 MG PO TBEC
40.0000 mg | DELAYED_RELEASE_TABLET | ORAL | Status: DC
  Administered 2018-08-30: 40 mg via ORAL
  Filled 2018-08-30: qty 1

## 2018-08-30 MED ORDER — NITROGLYCERIN 0.4 MG SL SUBL: 0.4000 mg | SUBLINGUAL_TABLET | SUBLINGUAL | Status: DC | PRN

## 2018-08-30 MED ORDER — ROSUVASTATIN CALCIUM 5 MG PO TABS
10.0000 mg | ORAL_TABLET | Freq: Every day | ORAL | Status: DC
  Filled 2018-08-30: qty 2

## 2018-08-30 MED ORDER — CLOPIDOGREL BISULFATE 75 MG PO TABS
75.0000 mg | ORAL_TABLET | Freq: Every day | ORAL | Status: DC
  Administered 2018-08-30: 14:00:00 75 mg via ORAL
  Filled 2018-08-30: qty 1

## 2018-08-30 MED ORDER — ENOXAPARIN SODIUM 40 MG/0.4ML ~~LOC~~ SOLN
40.0000 mg | Freq: Every day | SUBCUTANEOUS | Status: DC
  Administered 2018-08-30: 40 mg via SUBCUTANEOUS
  Filled 2018-08-30: qty 0.4

## 2018-08-30 MED ORDER — IOHEXOL 350 MG/ML SOLN
80.0000 mL | Freq: Once | INTRAVENOUS | Status: AC | PRN
  Administered 2018-08-30: 80 mL via INTRAVENOUS

## 2018-08-30 MED ORDER — SUCRALFATE 1 GM/10ML PO SUSP: 1.0000 g | Freq: Four times a day (QID) | ORAL | Status: DC | PRN

## 2018-08-30 MED ORDER — ONDANSETRON HCL 4 MG/2ML IJ SOLN: 4.0000 mg | Freq: Four times a day (QID) | INTRAMUSCULAR | Status: DC | PRN

## 2018-08-30 MED ORDER — SIMETHICONE 80 MG PO CHEW: 80.0000 mg | CHEWABLE_TABLET | Freq: Three times a day (TID) | ORAL | Status: DC | PRN

## 2018-08-30 MED ORDER — MORPHINE SULFATE (PF) 2 MG/ML IV SOLN: 2.0000 mg | INTRAVENOUS | Status: DC | PRN

## 2018-08-30 MED ORDER — METOPROLOL SUCCINATE ER 25 MG PO TB24
25.0000 mg | ORAL_TABLET | Freq: Every day | ORAL | Status: DC
  Administered 2018-08-30: 25 mg via ORAL
  Filled 2018-08-30: qty 1
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
## 2018-08-30 NOTE — ED Notes (Signed)
EDP at bedside  
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
## 2018-08-30 NOTE — ED Triage Notes (Signed)
Pt had CP for about an hr that felt similar to when he had an MI back in September 2019, the pain started in his chest, it felt tight & it radiated to under his Lt arm. States that the CP did make it feel hard to breathe during those moments, he felt sweaty & feeling like he was going to pass out (per pt). EMS was contacted, in route to ED pt was given 2 Nitro tabs & 324 mg of aspirin, pt states that the pain has stopped upon arrival to ED.

## 2018-08-30 NOTE — Progress Notes (Signed)
Patient discharged to home with wife. Reminded to see MD on Friday.
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
## 2018-09-02 ENCOUNTER — Ambulatory Visit: Admit: 2018-09-02 | Discharge: 2018-09-15 | Payer: BLUE CROSS/BLUE SHIELD

## 2018-09-02 ENCOUNTER — Encounter: Admit: 2018-09-02 | Discharge: 2018-09-15 | Payer: BLUE CROSS/BLUE SHIELD

## 2018-09-02 DIAGNOSIS — R079 Chest pain, unspecified: Secondary | ICD-10-CM

## 2018-09-02 DIAGNOSIS — I10 Essential (primary) hypertension: Secondary | ICD-10-CM

## 2018-09-02 DIAGNOSIS — I2510 Atherosclerotic heart disease of native coronary artery without angina pectoris: Principal | ICD-10-CM

## 2018-09-02 DIAGNOSIS — E785 Hyperlipidemia, unspecified: Secondary | ICD-10-CM | POA: Diagnosis not present

## 2018-09-02 DIAGNOSIS — R9439 Abnormal result of other cardiovascular function study: Secondary | ICD-10-CM | POA: Diagnosis not present
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
## 2018-09-05 DIAGNOSIS — R262 Difficulty in walking, not elsewhere classified: Secondary | ICD-10-CM | POA: Diagnosis not present

## 2018-09-05 DIAGNOSIS — G4733 Obstructive sleep apnea (adult) (pediatric): Secondary | ICD-10-CM | POA: Diagnosis not present

## 2018-09-05 DIAGNOSIS — M25561 Pain in right knee: Secondary | ICD-10-CM | POA: Diagnosis not present

## 2018-09-05 DIAGNOSIS — M6281 Muscle weakness (generalized): Secondary | ICD-10-CM | POA: Diagnosis not present

## 2018-09-19 ENCOUNTER — Encounter: Payer: Self-pay | Admitting: Nurse Practitioner

## 2018-09-19 ENCOUNTER — Other Ambulatory Visit: Payer: Self-pay

## 2018-09-19 ENCOUNTER — Ambulatory Visit (INDEPENDENT_AMBULATORY_CARE_PROVIDER_SITE_OTHER): Payer: BC Managed Care – PPO | Admitting: Nurse Practitioner

## 2018-09-19 VITALS — BP 136/88 | HR 72 | Temp 97.7°F | Resp 14 | Ht 72.0 in | Wt 231.7 lb

## 2018-09-19 DIAGNOSIS — R0602 Shortness of breath: Secondary | ICD-10-CM | POA: Diagnosis not present

## 2018-09-19 DIAGNOSIS — K2100 Gastro-esophageal reflux disease with esophagitis, without bleeding: Secondary | ICD-10-CM

## 2018-09-19 DIAGNOSIS — R52 Pain, unspecified: Secondary | ICD-10-CM

## 2018-09-19 DIAGNOSIS — J4530 Mild persistent asthma, uncomplicated: Secondary | ICD-10-CM

## 2018-09-19 DIAGNOSIS — G8929 Other chronic pain: Secondary | ICD-10-CM

## 2018-09-19 MED ORDER — HYDROCODONE BITARTRATE ER 10 MG PO C12A: 5.0000 mg | EXTENDED_RELEASE_CAPSULE | Freq: Two times a day (BID) | ORAL | 0 refills | Status: DC | PRN
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
## 2018-09-19 NOTE — Patient Instructions (Signed)
-   Restart flovent once daily and if you have a reaction please send me a mychart message and we will add it to your allergy list - I have a sent a referral to a pulmonologist (lung doctor) if you do not hear back from them within a month please let us know -Please try taking the hydrocodone without acetaminophen, please send a mychart message if this helps with pain and your are not having shortness of breath with this.

## 2018-09-20 ENCOUNTER — Telehealth: Payer: Self-pay | Admitting: Family Medicine

## 2018-09-20 NOTE — Telephone Encounter (Signed)
Will you ask him if he would like to try a low dose of oxycodone instead?

## 2018-09-20 NOTE — Telephone Encounter (Signed)
Pharm is calling and hydrocodone cost  499.00 his insurance will not pay. Pt will need something else

## 2018-09-21 MED ORDER — FAMOTIDINE 20 MG PO TABS: 20.0000 mg | ORAL_TABLET | Freq: Every day | ORAL | 3 refills | Status: DC

## 2018-09-21 MED ORDER — OXYCODONE HCL 5 MG PO TABA: 0.5000 | ORAL_TABLET | Freq: Two times a day (BID) | ORAL | 0 refills | Status: DC | PRN

## 2018-09-21 NOTE — Addendum Note (Signed)
Addended by: Fredderick Severance on: 09/21/2018 01:20 PM   Modules accepted: Orders

## 2018-09-21 NOTE — Telephone Encounter (Signed)
Pt stated he will try oxycodone.   Patient said he would like the 20mg  of pepcid instead of the prior 40mg  as discussed during his visit.

## 2018-10-05 DIAGNOSIS — G4733 Obstructive sleep apnea (adult) (pediatric): Secondary | ICD-10-CM | POA: Diagnosis not present

## 2018-10-05 DIAGNOSIS — M6281 Muscle weakness (generalized): Secondary | ICD-10-CM | POA: Diagnosis not present

## 2018-10-05 DIAGNOSIS — R262 Difficulty in walking, not elsewhere classified: Secondary | ICD-10-CM | POA: Diagnosis not present

## 2018-10-05 DIAGNOSIS — M25561 Pain in right knee: Secondary | ICD-10-CM | POA: Diagnosis not present

## 2018-10-10 DIAGNOSIS — R002 Palpitations: Principal | ICD-10-CM

## 2018-10-10 DIAGNOSIS — K219 Gastro-esophageal reflux disease without esophagitis: Secondary | ICD-10-CM | POA: Diagnosis not present

## 2018-10-10 DIAGNOSIS — Z7982 Long term (current) use of aspirin: Secondary | ICD-10-CM | POA: Diagnosis not present

## 2018-10-10 DIAGNOSIS — R0602 Shortness of breath: Secondary | ICD-10-CM | POA: Diagnosis not present

## 2018-10-10 DIAGNOSIS — R918 Other nonspecific abnormal finding of lung field: Secondary | ICD-10-CM | POA: Diagnosis not present

## 2018-10-10 DIAGNOSIS — I10 Essential (primary) hypertension: Secondary | ICD-10-CM | POA: Diagnosis not present

## 2018-10-10 DIAGNOSIS — Z87891 Personal history of nicotine dependence: Secondary | ICD-10-CM | POA: Diagnosis not present

## 2018-10-10 DIAGNOSIS — I2510 Atherosclerotic heart disease of native coronary artery without angina pectoris: Secondary | ICD-10-CM | POA: Diagnosis not present

## 2018-10-10 DIAGNOSIS — Z20828 Contact with and (suspected) exposure to other viral communicable diseases: Secondary | ICD-10-CM | POA: Diagnosis not present

## 2018-10-10 DIAGNOSIS — I252 Old myocardial infarction: Secondary | ICD-10-CM | POA: Diagnosis not present

## 2018-10-10 DIAGNOSIS — R0989 Other specified symptoms and signs involving the circulatory and respiratory systems: Secondary | ICD-10-CM | POA: Diagnosis not present

## 2018-10-10 DIAGNOSIS — Z7902 Long term (current) use of antithrombotics/antiplatelets: Secondary | ICD-10-CM | POA: Diagnosis not present

## 2018-10-10 DIAGNOSIS — I4891 Unspecified atrial fibrillation: Secondary | ICD-10-CM | POA: Diagnosis not present

## 2018-10-10 DIAGNOSIS — G4733 Obstructive sleep apnea (adult) (pediatric): Secondary | ICD-10-CM | POA: Diagnosis not present

## 2018-10-10 DIAGNOSIS — E785 Hyperlipidemia, unspecified: Secondary | ICD-10-CM | POA: Diagnosis not present

## 2018-10-10 DIAGNOSIS — R05 Cough: Secondary | ICD-10-CM | POA: Diagnosis not present

## 2018-10-10 DIAGNOSIS — Z79899 Other long term (current) drug therapy: Secondary | ICD-10-CM | POA: Diagnosis not present

## 2018-10-10 DIAGNOSIS — F419 Anxiety disorder, unspecified: Secondary | ICD-10-CM | POA: Diagnosis not present

## 2018-10-11 ENCOUNTER — Ambulatory Visit
Admit: 2018-10-11 | Discharge: 2018-10-11 | Disposition: A | Discharge status: Home / Self Care | Payer: BLUE CROSS/BLUE SHIELD

## 2018-10-11 ENCOUNTER — Emergency Department
Admit: 2018-10-11 | Discharge: 2018-10-11 | Disposition: A | Discharge status: Home / Self Care | Payer: BLUE CROSS/BLUE SHIELD

## 2018-10-13 ENCOUNTER — Encounter: Payer: Self-pay | Admitting: Family Medicine

## 2018-10-16 ENCOUNTER — Other Ambulatory Visit: Payer: Self-pay | Admitting: Nurse Practitioner

## 2018-10-16 MED ORDER — HYDROCODONE-ACETAMINOPHEN 10-325 MG PO TABS: 0.5000 | ORAL_TABLET | Freq: Two times a day (BID) | ORAL | 0 refills | Status: DC | PRN

## 2018-10-24 ENCOUNTER — Ambulatory Visit
Admit: 2018-10-24 | Discharge: 2018-10-25 | Disposition: A | Discharge status: Home / Self Care | Payer: BLUE CROSS/BLUE SHIELD

## 2018-10-24 ENCOUNTER — Encounter
Admit: 2018-10-24 | Discharge: 2018-10-25 | Disposition: A | Discharge status: Home / Self Care | Payer: BLUE CROSS/BLUE SHIELD

## 2018-10-24 DIAGNOSIS — R002 Palpitations: Principal | ICD-10-CM

## 2018-10-24 DIAGNOSIS — Z955 Presence of coronary angioplasty implant and graft: Secondary | ICD-10-CM | POA: Diagnosis not present

## 2018-10-24 DIAGNOSIS — G4733 Obstructive sleep apnea (adult) (pediatric): Secondary | ICD-10-CM | POA: Diagnosis not present

## 2018-10-24 DIAGNOSIS — I4891 Unspecified atrial fibrillation: Secondary | ICD-10-CM | POA: Diagnosis not present

## 2018-10-24 DIAGNOSIS — R0602 Shortness of breath: Secondary | ICD-10-CM | POA: Diagnosis not present

## 2018-10-24 DIAGNOSIS — I10 Essential (primary) hypertension: Secondary | ICD-10-CM | POA: Diagnosis not present

## 2018-10-24 DIAGNOSIS — E785 Hyperlipidemia, unspecified: Secondary | ICD-10-CM | POA: Diagnosis not present

## 2018-10-24 DIAGNOSIS — Z87891 Personal history of nicotine dependence: Secondary | ICD-10-CM | POA: Diagnosis not present

## 2018-10-24 DIAGNOSIS — I252 Old myocardial infarction: Secondary | ICD-10-CM | POA: Diagnosis not present

## 2018-10-24 DIAGNOSIS — F419 Anxiety disorder, unspecified: Secondary | ICD-10-CM | POA: Diagnosis not present

## 2018-10-24 DIAGNOSIS — R079 Chest pain, unspecified: Secondary | ICD-10-CM | POA: Diagnosis not present

## 2018-10-24 DIAGNOSIS — Z6831 Body mass index (BMI) 31.0-31.9, adult: Secondary | ICD-10-CM | POA: Diagnosis not present

## 2018-10-24 DIAGNOSIS — Z79899 Other long term (current) drug therapy: Secondary | ICD-10-CM | POA: Diagnosis not present

## 2018-10-24 DIAGNOSIS — R918 Other nonspecific abnormal finding of lung field: Secondary | ICD-10-CM | POA: Diagnosis not present

## 2018-10-24 DIAGNOSIS — I2510 Atherosclerotic heart disease of native coronary artery without angina pectoris: Secondary | ICD-10-CM | POA: Diagnosis not present

## 2018-10-24 DIAGNOSIS — K219 Gastro-esophageal reflux disease without esophagitis: Secondary | ICD-10-CM | POA: Diagnosis not present

## 2018-10-24 DIAGNOSIS — K5900 Constipation, unspecified: Secondary | ICD-10-CM | POA: Diagnosis not present

## 2018-10-24 MED ORDER — SUCRALFATE 1 GRAM TABLET
ORAL_TABLET | Freq: Four times a day (QID) | ORAL | 0 refills | 8.00000 days | Status: CP | PRN
Start: 2018-10-24 — End: 2018-11-23

## 2018-10-29 ENCOUNTER — Encounter: Admit: 2018-10-29 | Discharge: 2018-10-30 | Payer: BLUE CROSS/BLUE SHIELD

## 2018-10-29 DIAGNOSIS — R079 Chest pain, unspecified: Principal | ICD-10-CM

## 2018-10-29 DIAGNOSIS — E785 Hyperlipidemia, unspecified: Secondary | ICD-10-CM | POA: Diagnosis not present

## 2018-10-29 DIAGNOSIS — Z6831 Body mass index (BMI) 31.0-31.9, adult: Secondary | ICD-10-CM | POA: Diagnosis not present

## 2018-10-29 DIAGNOSIS — I252 Old myocardial infarction: Secondary | ICD-10-CM | POA: Diagnosis not present

## 2018-10-29 DIAGNOSIS — I4891 Unspecified atrial fibrillation: Secondary | ICD-10-CM | POA: Diagnosis not present

## 2018-10-29 DIAGNOSIS — Z79899 Other long term (current) drug therapy: Secondary | ICD-10-CM | POA: Diagnosis not present

## 2018-10-29 DIAGNOSIS — K219 Gastro-esophageal reflux disease without esophagitis: Secondary | ICD-10-CM | POA: Diagnosis not present

## 2018-10-29 DIAGNOSIS — I10 Essential (primary) hypertension: Secondary | ICD-10-CM | POA: Diagnosis not present

## 2018-10-29 DIAGNOSIS — Z7902 Long term (current) use of antithrombotics/antiplatelets: Secondary | ICD-10-CM | POA: Diagnosis not present

## 2018-10-29 DIAGNOSIS — R0789 Other chest pain: Secondary | ICD-10-CM | POA: Diagnosis not present

## 2018-10-29 DIAGNOSIS — Z87891 Personal history of nicotine dependence: Secondary | ICD-10-CM | POA: Diagnosis not present

## 2018-10-29 DIAGNOSIS — I2510 Atherosclerotic heart disease of native coronary artery without angina pectoris: Secondary | ICD-10-CM | POA: Diagnosis not present

## 2018-10-29 DIAGNOSIS — R42 Dizziness and giddiness: Secondary | ICD-10-CM | POA: Diagnosis not present

## 2018-10-29 DIAGNOSIS — R8289 Other abnormal findings on cytological and histological examination of urine: Secondary | ICD-10-CM | POA: Diagnosis not present

## 2018-10-29 DIAGNOSIS — Z1159 Encounter for screening for other viral diseases: Secondary | ICD-10-CM | POA: Diagnosis not present

## 2018-10-29 DIAGNOSIS — R55 Syncope and collapse: Secondary | ICD-10-CM | POA: Diagnosis not present

## 2018-10-29 DIAGNOSIS — Z20828 Contact with and (suspected) exposure to other viral communicable diseases: Secondary | ICD-10-CM | POA: Diagnosis not present

## 2018-10-30 DIAGNOSIS — Z20828 Contact with and (suspected) exposure to other viral communicable diseases: Secondary | ICD-10-CM | POA: Diagnosis not present

## 2018-10-30 DIAGNOSIS — R079 Chest pain, unspecified: Secondary | ICD-10-CM | POA: Diagnosis not present

## 2018-10-30 DIAGNOSIS — R55 Syncope and collapse: Secondary | ICD-10-CM | POA: Diagnosis not present

## 2018-11-03 ENCOUNTER — Ambulatory Visit (INDEPENDENT_AMBULATORY_CARE_PROVIDER_SITE_OTHER): Payer: BC Managed Care – PPO | Admitting: Gastroenterology

## 2018-11-03 ENCOUNTER — Other Ambulatory Visit: Payer: Self-pay

## 2018-11-03 ENCOUNTER — Encounter: Payer: Self-pay | Admitting: Gastroenterology

## 2018-11-03 VITALS — BP 143/84 | HR 68 | Temp 98.1°F | Ht 72.0 in | Wt 233.0 lb

## 2018-11-03 DIAGNOSIS — K219 Gastro-esophageal reflux disease without esophagitis: Secondary | ICD-10-CM

## 2018-11-03 MED ORDER — PANTOPRAZOLE SODIUM 40 MG PO TBEC: 40.0000 mg | DELAYED_RELEASE_TABLET | Freq: Two times a day (BID) | ORAL | 2 refills | Status: DC

## 2018-11-03 NOTE — Progress Notes (Signed)
[name] MD, MRCP(U.K) [address]  [place]  [place], [place] [address]  Main: [phone]  Fax: [phone]   Primary Care Physician: Arnetha Courser, MD  Primary Gastroenterologist:  Dr. [name]   Here today to see me for acid reflux   HPI: Dale Kennedy is a 43 y.o. male    Summary of history :  He was initially seen on 04/21/16 for abnormal LFT's, including a positive smooth muscle antibody . He also did have an iron deficiency anemia. Lastfewyears has had an elevated alkaline phosphotase with normal transaminases .Fractionated components have been checked on two occasions show a mildly elevated liver component with normal bone components. GGT too has been mildly elevated in the past . RUQ USG shows no abnormality and normal bile ducts.  In April 2020 he was treated for IBS D with Xifaxan  04/2016-MRCP normal  05/2016- Hb 13.1  HCV ab ,urine analysis , HIV ab ,celiac serology,HvsAgANA,AMA -negative Ceruloplasmin,A1AT - normal  F actin weakly positive 27  CRP elevated 2.6  Smooth muscle antibody has been elevated at 23 and 29 on two occasions when checked. 04/07/2018 : H pylori breath test - negative.  03/2018 : HB 13 grams  02/2018 : LFT's normal  05/25/2018 : Hb stable at 12.6 grams . Black stool likely due to taking Peptobismol.   05/2016 EGD- normal with duodenal biopsies Colonoscopy -few ulcers seen on the ileo cecal valve - Normal TI biopsy,IC valve showed mild acute inflammation -no chronic changes. 10/2016 : Capsule study normal   Seen by Dr Grayland Ormond in 9/2060for abnormal elevated kappa light chains , microcytosis , placed on oral iron.    Interval history 06/09/2018-11/03/2018  Was previously treated for IBS D with Xifaxan in April 2020. On plavix.  He says after his visit in April 2020 he felt completely well till about 3 weeks back when he began to develop discomfort in his throat and center of his chest only when he eats.  The  symptoms are like a tightness which would resolve immediately after he took an antacid.  This does not feel like symptoms he has had in the past.  He says that he had been taking a PPI in the past but has stopped since he is feeling great and restarted taking it 3 weeks prior earlier he says that he takes his PPI after he takes his meals.  Denies any other symptoms..     Current Outpatient Medications  Medication Sig Dispense Refill  . acetaminophen (TYLENOL) 500 MG tablet Take 1,000 mg by mouth every 6 (six) hours as needed for mild pain.    Marland Kitchen albuterol (VENTOLIN HFA) 108 (90 Base) MCG/ACT inhaler Inhale 2 puffs into the lungs every 6 (six) hours as needed for wheezing or shortness of breath. 1 Inhaler 0  . aspirin 81 MG tablet Take 81 mg by mouth daily.    . clopidogrel (PLAVIX) 75 MG tablet Take 1 tablet by mouth daily.    Marland Kitchen co-enzyme Q-10 30 MG capsule Take 30 mg by mouth daily.    Marland Kitchen ezetimibe (ZETIA) 10 MG tablet TAKE 1 TABLET BY MOUTH EVERY MORNING FOR CHOLESTEROL (Patient not taking: No sig reported) 30 tablet 0  . famotidine (PEPCID) 20 MG tablet Take 1 tablet (20 mg total) by mouth daily. 90 tablet 3  . FLOVENT DISKUS 50 MCG/BLIST diskus inhaler Inhale 1 puff into the lungs as needed.     Marland Kitchen HYDROcodone-acetaminophen (NORCO) 10-325 MG tablet Take 0.5-1  tablets by mouth every 12 (twelve) hours as needed for severe pain. 45 tablet 0  . loratadine (CLARITIN) 10 MG tablet TAKE 1 TABLET BY MOUTH ONCE DAILY AS NEEDED FOR ALLERGIES (Patient taking differently: Take 10 mg by mouth daily as needed for allergies. ) 30 tablet 11  . metoprolol succinate (TOPROL-XL) 25 MG 24 hr tablet Take 25 mg by mouth daily.     . nitroGLYCERIN (NITROSTAT) 0.4 MG SL tablet Place 1 tablet under the tongue as needed.  2  . pantoprazole (PROTONIX) 40 MG tablet Take 40 mg by mouth daily as needed.    . rosuvastatin (CRESTOR) 20 MG tablet Take 10 mg by mouth daily.  6   No current facility-administered medications  for this visit.     Allergies as of 11/03/2018 - Review Complete 09/19/2018  Allergen Reaction Noted  . Ace inhibitors Swelling 12/24/2016    ROS:  General: Negative for anorexia, weight loss, fever, chills, fatigue, weakness. ENT: Negative for hoarseness, difficulty swallowing , nasal congestion. CV: Negative for chest pain, angina, palpitations, dyspnea on exertion, peripheral edema.  Respiratory: Negative for dyspnea at rest, dyspnea on exertion, cough, sputum, wheezing.  GI: See history of present illness. GU:  Negative for dysuria, hematuria, urinary incontinence, urinary frequency, nocturnal urination.  Endo: Negative for unusual weight change.    Physical Examination:   There were no vitals taken for this visit.  General: Well-nourished, well-developed in no acute distress.  Eyes: No icterus. Conjunctivae pink. Mouth: Oropharyngeal mucosa moist and pink , no lesions erythema or exudate. Lungs: Clear to auscultation bilaterally. Non-labored. Heart: Regular rate and rhythm, no murmurs rubs or gallops.  Abdomen: Bowel sounds are normal, nontender, nondistended, no hepatosplenomegaly or masses, no abdominal bruits or hernia , no rebound or guarding.   Extremities: No lower extremity edema. No clubbing or deformities. Neuro: Alert and oriented x 3.  Grossly intact. Skin: Warm and dry, no jaundice.   Psych: Alert and cooperative, normal mood and affect.   Imaging Studies: No results found.  Assessment and Plan:   Dale Kennedy is a 43 y.o. y/o male here to see me for acid reflux.  History of IBS-D [treated with a course of Xifaxan], microcytic iron def anemia which no cause was found on GI evaluation.  Follows with Dr Grayland Ormond .  Today he is back to see me for symptoms of chest discomfort right after he eats immediately resolved after taking an antacid.  The symptoms are very suggestive of acid reflux.  He states that he stopped taking his PPI previously because of resolution of  all his symptoms.  Over the last 3 weeks he has restarted his PPI but unfortunately takes it after his meals which makes it not very effective.  We decided that he will increase his PPI to 40 mg twice daily for a short period of time and take it 30 minutes before meals on an empty stomach preferably.  If it does not get better in 4 to 6 weeks time then will proceed with endoscopy.    Dr [name]  MD,MRCP [facility]) Follow up in 6 weeks

## 2018-11-05 DIAGNOSIS — R262 Difficulty in walking, not elsewhere classified: Secondary | ICD-10-CM | POA: Diagnosis not present

## 2018-11-05 DIAGNOSIS — M6281 Muscle weakness (generalized): Secondary | ICD-10-CM | POA: Diagnosis not present

## 2018-11-05 DIAGNOSIS — M25561 Pain in right knee: Secondary | ICD-10-CM | POA: Diagnosis not present

## 2018-11-05 DIAGNOSIS — G4733 Obstructive sleep apnea (adult) (pediatric): Secondary | ICD-10-CM | POA: Diagnosis not present

## 2018-11-14 ENCOUNTER — Telehealth: Payer: Self-pay | Admitting: Internal Medicine

## 2018-11-14 NOTE — Telephone Encounter (Signed)
Called patient for COVID-19 pre-screening for in office visit.  Have you recently traveled any where out of the local area in the last 2 weeks? No  Have you been in close contact with a person diagnosed with COVID-19 or someone awaiting results within the last 2 weeks? No  Do you currently have any of the following symptoms? If so, when did they start? Cough     Diarrhea   Joint Pain Fever      Muscle Pain   Red eyes Shortness of breath    Rash    Sore Throat Headache    Weakness   Bruising or bleeding   Okay to proceed with visit 11/15/2018
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
## 2018-11-15 ENCOUNTER — Encounter: Payer: Self-pay | Admitting: Internal Medicine

## 2018-11-15 ENCOUNTER — Ambulatory Visit (INDEPENDENT_AMBULATORY_CARE_PROVIDER_SITE_OTHER): Payer: BC Managed Care – PPO | Admitting: Internal Medicine

## 2018-11-15 ENCOUNTER — Other Ambulatory Visit: Payer: Self-pay

## 2018-11-15 VITALS — BP 130/78 | HR 65 | Temp 97.3°F | Ht 72.0 in | Wt 233.0 lb

## 2018-11-15 DIAGNOSIS — R0609 Other forms of dyspnea: Secondary | ICD-10-CM

## 2018-11-15 DIAGNOSIS — G4733 Obstructive sleep apnea (adult) (pediatric): Secondary | ICD-10-CM | POA: Diagnosis not present

## 2018-11-15 NOTE — Patient Instructions (Signed)
Clean out supplies about once per week.  Continue to try to use cpap every night for the entire every night.

## 2018-11-16 ENCOUNTER — Encounter: Payer: Self-pay | Admitting: Nurse Practitioner

## 2018-11-16 ENCOUNTER — Ambulatory Visit (INDEPENDENT_AMBULATORY_CARE_PROVIDER_SITE_OTHER): Payer: BC Managed Care – PPO | Admitting: Nurse Practitioner

## 2018-11-16 VITALS — Resp 16

## 2018-11-16 DIAGNOSIS — R52 Pain, unspecified: Secondary | ICD-10-CM

## 2018-11-16 DIAGNOSIS — J4530 Mild persistent asthma, uncomplicated: Secondary | ICD-10-CM

## 2018-11-16 DIAGNOSIS — G8929 Other chronic pain: Secondary | ICD-10-CM

## 2018-11-16 DIAGNOSIS — I10 Essential (primary) hypertension: Secondary | ICD-10-CM | POA: Diagnosis not present

## 2018-11-16 DIAGNOSIS — I480 Paroxysmal atrial fibrillation: Secondary | ICD-10-CM

## 2018-11-16 DIAGNOSIS — I2510 Atherosclerotic heart disease of native coronary artery without angina pectoris: Secondary | ICD-10-CM | POA: Diagnosis not present

## 2018-11-16 DIAGNOSIS — E782 Mixed hyperlipidemia: Secondary | ICD-10-CM

## 2018-11-16 DIAGNOSIS — I272 Pulmonary hypertension, unspecified: Secondary | ICD-10-CM | POA: Diagnosis not present

## 2018-11-16 DIAGNOSIS — M62838 Other muscle spasm: Secondary | ICD-10-CM

## 2018-11-16 MED ORDER — TIZANIDINE HCL 4 MG PO TABS: 4.0000 mg | ORAL_TABLET | Freq: Four times a day (QID) | ORAL | 0 refills | Status: DC | PRN

## 2018-11-16 MED ORDER — HYDROCODONE-ACETAMINOPHEN 10-325 MG PO TABS: 1.0000 | ORAL_TABLET | Freq: Three times a day (TID) | ORAL | 0 refills | Status: AC | PRN

## 2018-11-16 MED ORDER — HYDROCODONE-ACETAMINOPHEN 10-325 MG PO TABS: 0.5000 | ORAL_TABLET | Freq: Two times a day (BID) | ORAL | 0 refills | Status: DC | PRN
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
## 2018-11-17 ENCOUNTER — Ambulatory Visit: Payer: BLUE CROSS/BLUE SHIELD | Admitting: Family Medicine

## 2018-11-21 DIAGNOSIS — R079 Chest pain, unspecified: Principal | ICD-10-CM

## 2018-11-21 DIAGNOSIS — E785 Hyperlipidemia, unspecified: Secondary | ICD-10-CM | POA: Diagnosis not present

## 2018-11-21 DIAGNOSIS — R0789 Other chest pain: Secondary | ICD-10-CM | POA: Diagnosis not present

## 2018-11-21 DIAGNOSIS — Z7902 Long term (current) use of antithrombotics/antiplatelets: Secondary | ICD-10-CM | POA: Diagnosis not present

## 2018-11-21 DIAGNOSIS — I10 Essential (primary) hypertension: Secondary | ICD-10-CM | POA: Diagnosis not present

## 2018-11-21 DIAGNOSIS — Z955 Presence of coronary angioplasty implant and graft: Secondary | ICD-10-CM | POA: Diagnosis not present

## 2018-11-21 DIAGNOSIS — Z888 Allergy status to other drugs, medicaments and biological substances status: Secondary | ICD-10-CM | POA: Diagnosis not present

## 2018-11-21 DIAGNOSIS — K219 Gastro-esophageal reflux disease without esophagitis: Secondary | ICD-10-CM | POA: Diagnosis not present

## 2018-11-21 DIAGNOSIS — I252 Old myocardial infarction: Secondary | ICD-10-CM | POA: Diagnosis not present

## 2018-11-21 DIAGNOSIS — R1032 Left lower quadrant pain: Secondary | ICD-10-CM | POA: Diagnosis not present

## 2018-11-21 DIAGNOSIS — Z87891 Personal history of nicotine dependence: Secondary | ICD-10-CM | POA: Diagnosis not present

## 2018-11-21 DIAGNOSIS — Z8249 Family history of ischemic heart disease and other diseases of the circulatory system: Secondary | ICD-10-CM | POA: Diagnosis not present

## 2018-11-21 DIAGNOSIS — Z6831 Body mass index (BMI) 31.0-31.9, adult: Secondary | ICD-10-CM | POA: Diagnosis not present

## 2018-11-21 DIAGNOSIS — Z7951 Long term (current) use of inhaled steroids: Secondary | ICD-10-CM | POA: Diagnosis not present

## 2018-11-21 DIAGNOSIS — I2510 Atherosclerotic heart disease of native coronary artery without angina pectoris: Secondary | ICD-10-CM | POA: Diagnosis not present

## 2018-11-21 DIAGNOSIS — G4733 Obstructive sleep apnea (adult) (pediatric): Secondary | ICD-10-CM | POA: Diagnosis not present

## 2018-11-21 DIAGNOSIS — I4891 Unspecified atrial fibrillation: Secondary | ICD-10-CM | POA: Diagnosis not present

## 2018-11-21 DIAGNOSIS — Z79899 Other long term (current) drug therapy: Secondary | ICD-10-CM | POA: Diagnosis not present

## 2018-11-21 DIAGNOSIS — F419 Anxiety disorder, unspecified: Secondary | ICD-10-CM | POA: Diagnosis not present

## 2018-11-21 NOTE — Telephone Encounter (Signed)
Please offer him an office visit and we can discuss this further to see what test we can do for him.

## 2018-11-22 ENCOUNTER — Ambulatory Visit
Admit: 2018-11-22 | Discharge: 2018-11-22 | Disposition: A | Discharge status: Home / Self Care | Payer: BLUE CROSS/BLUE SHIELD

## 2018-11-22 ENCOUNTER — Encounter
Admit: 2018-11-22 | Discharge: 2018-11-22 | Disposition: A | Discharge status: Home / Self Care | Payer: BLUE CROSS/BLUE SHIELD

## 2018-11-23 ENCOUNTER — Encounter: Admit: 2018-11-23 | Discharge: 2018-11-24 | Payer: BLUE CROSS/BLUE SHIELD

## 2018-11-23 DIAGNOSIS — I10 Essential (primary) hypertension: Secondary | ICD-10-CM

## 2018-11-23 DIAGNOSIS — I2510 Atherosclerotic heart disease of native coronary artery without angina pectoris: Principal | ICD-10-CM

## 2018-11-23 DIAGNOSIS — Z6831 Body mass index (BMI) 31.0-31.9, adult: Secondary | ICD-10-CM | POA: Diagnosis not present

## 2018-11-23 MED ORDER — CHLORTHALIDONE 25 MG TABLET
ORAL_TABLET | Freq: Every day | ORAL | 6 refills | 30.00000 days | Status: CP
Start: 2018-11-23 — End: 2018-12-23

## 2018-11-24 ENCOUNTER — Ambulatory Visit (INDEPENDENT_AMBULATORY_CARE_PROVIDER_SITE_OTHER): Payer: BC Managed Care – PPO | Admitting: Gastroenterology

## 2018-11-24 ENCOUNTER — Other Ambulatory Visit: Payer: Self-pay

## 2018-11-24 DIAGNOSIS — Z20822 Contact with and (suspected) exposure to covid-19: Secondary | ICD-10-CM

## 2018-11-24 DIAGNOSIS — K219 Gastro-esophageal reflux disease without esophagitis: Secondary | ICD-10-CM

## 2018-11-24 NOTE — Progress Notes (Signed)
Dale Kennedy , MD [address]  [place]  [place], [place] [address]  Main: [phone]  Fax: [phone]   Primary Care Physician: Fredderick Severance, NP  Virtual Visit via Telephone Note  I connected with patient on 11/24/18 at  2:30 PM EDT by telephone and verified that I am speaking with the correct person using two identifiers.   I discussed the limitations, risks, security and privacy concerns of performing an evaluation and management service by telephone and the availability of in person appointments. I also discussed with the patient that there may be a patient responsible charge related to this service. The patient expressed understanding and agreed to proceed.  Location of Patient: Home Location of Provider: Home Persons involved: Patient and provider only   History of Present Illness: GERD   HPI: Dale Kennedy is a 43 y.o. male   Summary of history :  He was initially seen on 04/21/16 for abnormal LFT's, including a positive smooth muscle antibody . He also did have an iron deficiency anemia. Lastfewyears has had an elevated alkaline phosphotase with normal transaminases .Fractionated components have been checked on two occasions show a mildly elevated liver component with normal bone components. GGT too has been mildly elevated in the past . RUQ USG shows no abnormality and normal bile ducts.  In April 2020 he was treated for IBS D with Xifaxan  04/2016-MRCP normal  05/2016- Hb 13.1  HCV ab ,urine analysis , HIV ab ,celiac serology,HvsAgANA,AMA -negative Ceruloplasmin,A1AT - normal  F actin weakly positive 27  CRP elevated 2.6  Smooth muscle antibody has been elevated at 23 and 29 on two occasions when checked. 04/07/2018 : H pylori breath test - negative.  03/2018 : HB 13 grams  02/2018 : LFT's normal  05/25/2018 : Hb stable at 12.6 grams . Black stool likely due to taking Peptobismol.   05/2016 EGD- normal with duodenal biopsies Colonoscopy  -few ulcers seen on the ileo cecal valve - Normal TI biopsy,IC valve showed mild acute inflammation -no chronic changes. 10/2016 : Capsule study normal   Seen by Dr Grayland Ormond in 9/2072for abnormal elevated kappa light chains , microcytosis , placed on oral iron.  Was previously treated for IBS D with Xifaxan in April 2020.  Interval history 11/03/2018-11/24/2018  He was seen on 11/03/2018 for discomfort in his throat and center of his chest only when he eats he had been taking his PPI after his meals.  Despite taking his PPI twice a day he says that the chest discomfort has not improved.  There is in fact while he takes the PPI things are actually worse.  He was told by his cardiologist to stop the Plavix.  He was suggested to get further GI evaluation.  Today's visit was initially an office visit but since he was having cough and was concern for COVID-19 hence was converted to a telephone visit Current Outpatient Medications  Medication Sig Dispense Refill  . acetaminophen (TYLENOL) 500 MG tablet Take 1,000 mg by mouth every 6 (six) hours as needed for mild pain.    Marland Kitchen albuterol (VENTOLIN HFA) 108 (90 Base) MCG/ACT inhaler Inhale 2 puffs into the lungs every 6 (six) hours as needed for wheezing or shortness of breath. 1 Inhaler 0  . aspirin 81 MG tablet Take 81 mg by mouth daily.    . clopidogrel (PLAVIX) 75 MG tablet Take 1 tablet by mouth daily.    Marland Kitchen co-enzyme Q-10 30 MG capsule Take 30  mg by mouth daily.    Marland Kitchen ezetimibe (ZETIA) 10 MG tablet TAKE 1 TABLET BY MOUTH EVERY MORNING FOR CHOLESTEROL 30 tablet 0  . famotidine (PEPCID) 20 MG tablet Take 1 tablet (20 mg total) by mouth daily. 90 tablet 3  . FLOVENT DISKUS 50 MCG/BLIST diskus inhaler Inhale 1 puff into the lungs as needed.     Derrill Memo ON 12/16/2018] HYDROcodone-acetaminophen (NORCO) 10-325 MG tablet Take 0.5-1 tablets by mouth every 12 (twelve) hours as needed for severe pain. 45 tablet 0  . [START ON 01/15/2019]  HYDROcodone-acetaminophen (NORCO) 10-325 MG tablet Take 1 tablet by mouth every 8 (eight) hours as needed for up to 5 days. 15 tablet 0  . loratadine (CLARITIN) 10 MG tablet TAKE 1 TABLET BY MOUTH ONCE DAILY AS NEEDED FOR ALLERGIES (Patient taking differently: Take 10 mg by mouth daily as needed for allergies. ) 30 tablet 11  . metoprolol succinate (TOPROL-XL) 25 MG 24 hr tablet Take 25 mg by mouth daily.     . nitroGLYCERIN (NITROSTAT) 0.4 MG SL tablet Place 1 tablet under the tongue as needed.  2  . rosuvastatin (CRESTOR) 20 MG tablet Take 10 mg by mouth daily.  6  . tiZANidine (ZANAFLEX) 4 MG tablet Take 1 tablet (4 mg total) by mouth every 6 (six) hours as needed for muscle spasms. 30 tablet 0   No current facility-administered medications for this visit.     Allergies as of 11/24/2018 - Review Complete 11/16/2018  Allergen Reaction Noted  . Ace inhibitors Swelling 12/24/2016    Review of Systems:    All systems reviewed and negative except where noted in HPI.   Observations/Objective:  Labs: CMP     Component Value Date/Time   NA 138 08/30/2018 0947   NA 140 05/18/2018 0900   NA 136 07/22/2013 0823   K 4.5 08/30/2018 0947   K 3.9 07/22/2013 0823   CL 103 08/30/2018 0947   CL 101 07/22/2013 0823   CO2 24 08/30/2018 0947   CO2 28 07/22/2013 0823   GLUCOSE 98 08/30/2018 0947   GLUCOSE 110 (H) 07/22/2013 0823   BUN 11 08/30/2018 0947   BUN 12 05/18/2018 0900   BUN 12 07/22/2013 0823   CREATININE 1.04 08/30/2018 0947   CREATININE 1.10 10/14/2016 0935   CALCIUM 9.2 08/30/2018 0947   CALCIUM 9.3 07/22/2013 0823   PROT 9.3 (H) 07/22/2018 0958   PROT 8.2 05/18/2018 0900   PROT 9.3 (H) 07/22/2013 0823   ALBUMIN 4.1 07/22/2018 0958   ALBUMIN 4.6 05/18/2018 0900   ALBUMIN 3.6 07/22/2013 0823   AST 30 07/22/2018 0958   AST 17 07/22/2013 0823   ALT 54 (H) 07/22/2018 0958   ALT 51 07/22/2013 0823   ALKPHOS 145 (H) 07/22/2018 0958   ALKPHOS 148 (H) 07/22/2013 0823   BILITOT  0.3 07/22/2018 0958   BILITOT 0.3 05/18/2018 0900   BILITOT 0.3 07/22/2013 0823   GFRNONAA >60 08/30/2018 0947   GFRNONAA 84 10/14/2016 0935   GFRAA >60 08/30/2018 0947   GFRAA >89 10/14/2016 0935   Lab Results  Component Value Date   WBC 5.6 08/30/2018   HGB 11.9 (L) 08/30/2018   HCT 41.3 08/30/2018   MCV 73.8 (L) 08/30/2018   PLT 402 (H) 08/30/2018    Imaging Studies: No results found.  Assessment and Plan:   Dale Kennedy is a 43 y.o. y/o malehere to see me for acid reflux.  History ofIBS-D [treated with a course of Xifaxan],  microcytic iron def anemia which no cause was found on GI evaluation.  Follows with Dr Grayland Ormond .   At his last visit he had features of acid reflux and I increase his PPI to twice a day.  Prior to his visit he had been taking his PPI once a day but after meals which was the wrong way to take it.  Since his last visit feels no better rather feels worse after commencing on the PPI.  I discussed that probably the next up would be to perform an EGD to rule out esophagitis.  I have discussed alternative options, risks & benefits,  which include, but are not limited to, bleeding, infection, perforation,respiratory complication & drug reaction.  The patient agrees with this plan & written consent will be obtained.       I discussed the assessment and treatment plan with the patient. The patient was provided an opportunity to ask questions and all were answered. The patient agreed with the plan and demonstrated an understanding of the instructions.   The patient was advised to call back or seek an in-person evaluation if the symptoms worsen or if the condition fails to improve as anticipated.  I provided 12 minutes of non-face-to-face time during this encounter.  Dr Dale Bellows MD,MRCP [facility]) Gastroenterology/Hepatology Pager: [phone]   Speech recognition software was used to dictate this note.

## 2018-11-25 ENCOUNTER — Encounter: Payer: Self-pay | Admitting: Family Medicine

## 2018-11-25 ENCOUNTER — Encounter
Admit: 2018-11-25 | Discharge: 2018-11-25 | Disposition: A | Discharge status: Home / Self Care | Payer: BLUE CROSS/BLUE SHIELD | Attending: Emergency Medicine

## 2018-11-25 DIAGNOSIS — Z7902 Long term (current) use of antithrombotics/antiplatelets: Secondary | ICD-10-CM | POA: Diagnosis not present

## 2018-11-25 DIAGNOSIS — R05 Cough: Secondary | ICD-10-CM | POA: Diagnosis not present

## 2018-11-25 DIAGNOSIS — Z79899 Other long term (current) drug therapy: Secondary | ICD-10-CM | POA: Diagnosis not present

## 2018-11-25 DIAGNOSIS — I480 Paroxysmal atrial fibrillation: Secondary | ICD-10-CM | POA: Diagnosis not present

## 2018-11-25 DIAGNOSIS — I2510 Atherosclerotic heart disease of native coronary artery without angina pectoris: Secondary | ICD-10-CM | POA: Diagnosis not present

## 2018-11-25 DIAGNOSIS — Z87891 Personal history of nicotine dependence: Secondary | ICD-10-CM | POA: Diagnosis not present

## 2018-11-25 DIAGNOSIS — R0981 Nasal congestion: Secondary | ICD-10-CM | POA: Diagnosis not present

## 2018-11-25 DIAGNOSIS — Z7982 Long term (current) use of aspirin: Secondary | ICD-10-CM | POA: Diagnosis not present

## 2018-11-25 DIAGNOSIS — I10 Essential (primary) hypertension: Secondary | ICD-10-CM | POA: Diagnosis not present

## 2018-11-25 DIAGNOSIS — J3489 Other specified disorders of nose and nasal sinuses: Secondary | ICD-10-CM | POA: Diagnosis not present

## 2018-11-25 DIAGNOSIS — Z20828 Contact with and (suspected) exposure to other viral communicable diseases: Secondary | ICD-10-CM | POA: Diagnosis not present

## 2018-11-25 LAB — NOVEL CORONAVIRUS, NAA: SARS-CoV-2, NAA: NOT DETECTED

## 2018-11-25 MED ORDER — BENZONATATE 100 MG CAPSULE
ORAL_CAPSULE | Freq: Three times a day (TID) | ORAL | 0 refills | 7.00000 days | Status: CP
Start: 2018-11-25 — End: 2018-11-30

## 2018-11-28 ENCOUNTER — Ambulatory Visit (INDEPENDENT_AMBULATORY_CARE_PROVIDER_SITE_OTHER): Payer: BC Managed Care – PPO | Admitting: Nurse Practitioner

## 2018-11-28 ENCOUNTER — Other Ambulatory Visit: Payer: Self-pay

## 2018-11-28 ENCOUNTER — Encounter: Payer: Self-pay | Admitting: Nurse Practitioner

## 2018-11-28 VITALS — Temp 98.8°F | Resp 16

## 2018-11-28 DIAGNOSIS — R05 Cough: Secondary | ICD-10-CM | POA: Diagnosis not present

## 2018-11-28 DIAGNOSIS — R059 Cough, unspecified: Secondary | ICD-10-CM

## 2018-11-28 MED ORDER — AZITHROMYCIN 250 MG PO TABS: ORAL_TABLET | ORAL | 0 refills | Status: DC
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
## 2018-11-28 NOTE — Patient Instructions (Signed)
guaifenesin & dextromethorphan Humidifier Flonase nasal spray Drink lots of water   Try to use PLAIN allergy medicine without the decongestant Avoid: phenylephrine, phenylpropanolamine, and pseudoephredine.

## 2018-11-29 ENCOUNTER — Other Ambulatory Visit: Payer: Self-pay

## 2018-11-29 DIAGNOSIS — K219 Gastro-esophageal reflux disease without esophagitis: Secondary | ICD-10-CM

## 2018-11-30 ENCOUNTER — Encounter
Admit: 2018-11-30 | Discharge: 2018-12-01 | Discharge status: Against Medical Advice | Payer: BLUE CROSS/BLUE SHIELD | Attending: Emergency Medicine

## 2018-11-30 DIAGNOSIS — R0789 Other chest pain: Principal | ICD-10-CM

## 2018-11-30 DIAGNOSIS — R0602 Shortness of breath: Secondary | ICD-10-CM | POA: Diagnosis not present

## 2018-11-30 DIAGNOSIS — Z5329 Procedure and treatment not carried out because of patient's decision for other reasons: Secondary | ICD-10-CM | POA: Diagnosis not present

## 2018-11-30 DIAGNOSIS — R110 Nausea: Secondary | ICD-10-CM | POA: Diagnosis not present

## 2018-11-30 DIAGNOSIS — I252 Old myocardial infarction: Secondary | ICD-10-CM | POA: Diagnosis not present

## 2018-11-30 DIAGNOSIS — Z79899 Other long term (current) drug therapy: Secondary | ICD-10-CM | POA: Diagnosis not present

## 2018-11-30 DIAGNOSIS — R61 Generalized hyperhidrosis: Secondary | ICD-10-CM | POA: Diagnosis not present

## 2018-11-30 DIAGNOSIS — R079 Chest pain, unspecified: Secondary | ICD-10-CM | POA: Diagnosis not present

## 2018-11-30 DIAGNOSIS — R002 Palpitations: Secondary | ICD-10-CM | POA: Diagnosis not present

## 2018-11-30 DIAGNOSIS — Z87891 Personal history of nicotine dependence: Secondary | ICD-10-CM | POA: Diagnosis not present

## 2018-11-30 DIAGNOSIS — I10 Essential (primary) hypertension: Secondary | ICD-10-CM | POA: Diagnosis not present

## 2018-11-30 DIAGNOSIS — I2510 Atherosclerotic heart disease of native coronary artery without angina pectoris: Secondary | ICD-10-CM | POA: Diagnosis not present

## 2018-11-30 DIAGNOSIS — E785 Hyperlipidemia, unspecified: Secondary | ICD-10-CM | POA: Diagnosis not present

## 2018-12-04 ENCOUNTER — Encounter
Admit: 2018-12-04 | Discharge: 2018-12-04 | Disposition: A | Discharge status: Home / Self Care | Payer: BLUE CROSS/BLUE SHIELD | Attending: Emergency Medicine

## 2018-12-04 DIAGNOSIS — R079 Chest pain, unspecified: Principal | ICD-10-CM

## 2018-12-04 DIAGNOSIS — Z955 Presence of coronary angioplasty implant and graft: Secondary | ICD-10-CM | POA: Diagnosis not present

## 2018-12-04 DIAGNOSIS — K219 Gastro-esophageal reflux disease without esophagitis: Secondary | ICD-10-CM | POA: Diagnosis not present

## 2018-12-04 DIAGNOSIS — I10 Essential (primary) hypertension: Secondary | ICD-10-CM | POA: Diagnosis not present

## 2018-12-04 DIAGNOSIS — Z79899 Other long term (current) drug therapy: Secondary | ICD-10-CM | POA: Diagnosis not present

## 2018-12-04 DIAGNOSIS — Z7982 Long term (current) use of aspirin: Secondary | ICD-10-CM | POA: Diagnosis not present

## 2018-12-04 DIAGNOSIS — E785 Hyperlipidemia, unspecified: Secondary | ICD-10-CM | POA: Diagnosis not present

## 2018-12-04 DIAGNOSIS — Z87891 Personal history of nicotine dependence: Secondary | ICD-10-CM | POA: Diagnosis not present

## 2018-12-04 DIAGNOSIS — Z8249 Family history of ischemic heart disease and other diseases of the circulatory system: Secondary | ICD-10-CM | POA: Diagnosis not present

## 2018-12-04 DIAGNOSIS — F419 Anxiety disorder, unspecified: Secondary | ICD-10-CM | POA: Diagnosis not present

## 2018-12-04 DIAGNOSIS — R0789 Other chest pain: Secondary | ICD-10-CM | POA: Diagnosis not present

## 2018-12-04 DIAGNOSIS — I2510 Atherosclerotic heart disease of native coronary artery without angina pectoris: Secondary | ICD-10-CM | POA: Diagnosis not present

## 2018-12-08 ENCOUNTER — Ambulatory Visit: Admit: 2018-12-08 | Discharge: 2018-12-09 | Payer: BLUE CROSS/BLUE SHIELD

## 2018-12-08 DIAGNOSIS — I10 Essential (primary) hypertension: Secondary | ICD-10-CM

## 2018-12-08 DIAGNOSIS — I2510 Atherosclerotic heart disease of native coronary artery without angina pectoris: Secondary | ICD-10-CM

## 2018-12-09 ENCOUNTER — Encounter
Admit: 2018-12-09 | Discharge: 2018-12-10 | Disposition: A | Discharge status: Home / Self Care | Payer: BLUE CROSS/BLUE SHIELD

## 2018-12-09 DIAGNOSIS — K219 Gastro-esophageal reflux disease without esophagitis: Secondary | ICD-10-CM

## 2018-12-09 DIAGNOSIS — Z7982 Long term (current) use of aspirin: Secondary | ICD-10-CM

## 2018-12-09 DIAGNOSIS — I10 Essential (primary) hypertension: Secondary | ICD-10-CM

## 2018-12-09 DIAGNOSIS — Z87891 Personal history of nicotine dependence: Secondary | ICD-10-CM

## 2018-12-09 DIAGNOSIS — I2510 Atherosclerotic heart disease of native coronary artery without angina pectoris: Secondary | ICD-10-CM

## 2018-12-09 DIAGNOSIS — F419 Anxiety disorder, unspecified: Secondary | ICD-10-CM

## 2018-12-09 DIAGNOSIS — R079 Chest pain, unspecified: Secondary | ICD-10-CM

## 2018-12-09 DIAGNOSIS — E785 Hyperlipidemia, unspecified: Secondary | ICD-10-CM

## 2018-12-09 DIAGNOSIS — R072 Precordial pain: Secondary | ICD-10-CM | POA: Diagnosis not present

## 2018-12-12 ENCOUNTER — Encounter
Admit: 2018-12-12 | Discharge: 2018-12-12 | Disposition: A | Discharge status: Home / Self Care | Payer: BLUE CROSS/BLUE SHIELD | Attending: Family

## 2018-12-12 DIAGNOSIS — I2510 Atherosclerotic heart disease of native coronary artery without angina pectoris: Secondary | ICD-10-CM

## 2018-12-12 DIAGNOSIS — R079 Chest pain, unspecified: Secondary | ICD-10-CM

## 2018-12-12 DIAGNOSIS — Z7982 Long term (current) use of aspirin: Secondary | ICD-10-CM

## 2018-12-12 DIAGNOSIS — I10 Essential (primary) hypertension: Secondary | ICD-10-CM

## 2018-12-12 DIAGNOSIS — Z7902 Long term (current) use of antithrombotics/antiplatelets: Secondary | ICD-10-CM

## 2018-12-12 DIAGNOSIS — F419 Anxiety disorder, unspecified: Secondary | ICD-10-CM

## 2018-12-12 DIAGNOSIS — Z79899 Other long term (current) drug therapy: Secondary | ICD-10-CM

## 2018-12-12 DIAGNOSIS — I4891 Unspecified atrial fibrillation: Secondary | ICD-10-CM

## 2018-12-12 DIAGNOSIS — E785 Hyperlipidemia, unspecified: Secondary | ICD-10-CM

## 2018-12-12 DIAGNOSIS — Z955 Presence of coronary angioplasty implant and graft: Secondary | ICD-10-CM

## 2018-12-12 DIAGNOSIS — I252 Old myocardial infarction: Secondary | ICD-10-CM

## 2018-12-12 DIAGNOSIS — K219 Gastro-esophageal reflux disease without esophagitis: Secondary | ICD-10-CM

## 2018-12-12 DIAGNOSIS — Z87891 Personal history of nicotine dependence: Secondary | ICD-10-CM

## 2018-12-12 DIAGNOSIS — R110 Nausea: Secondary | ICD-10-CM | POA: Diagnosis not present

## 2018-12-12 DIAGNOSIS — Z6831 Body mass index (BMI) 31.0-31.9, adult: Secondary | ICD-10-CM | POA: Diagnosis not present

## 2018-12-12 DIAGNOSIS — R072 Precordial pain: Secondary | ICD-10-CM | POA: Diagnosis not present

## 2018-12-13 ENCOUNTER — Other Ambulatory Visit
Admission: RE | Admit: 2018-12-13 | Discharge: 2018-12-13 | Disposition: A | Discharge status: Home / Self Care | Payer: BC Managed Care – PPO | Source: Ambulatory Visit | Attending: Gastroenterology | Admitting: Gastroenterology

## 2018-12-13 ENCOUNTER — Other Ambulatory Visit: Payer: Self-pay

## 2018-12-13 DIAGNOSIS — Z20828 Contact with and (suspected) exposure to other viral communicable diseases: Secondary | ICD-10-CM | POA: Insufficient documentation

## 2018-12-13 DIAGNOSIS — Z01812 Encounter for preprocedural laboratory examination: Secondary | ICD-10-CM | POA: Diagnosis not present

## 2018-12-13 LAB — SARS CORONAVIRUS 2 (TAT 6-24 HRS): SARS Coronavirus 2: NEGATIVE

## 2018-12-13 MED ORDER — ATORVASTATIN 40 MG TABLET
ORAL_TABLET | Freq: Every day | ORAL | 6 refills | 30 days | Status: CP
Start: 2018-12-13 — End: 2019-01-12

## 2018-12-15 ENCOUNTER — Encounter: Payer: Self-pay | Admitting: *Deleted

## 2018-12-16 ENCOUNTER — Ambulatory Visit: Payer: BC Managed Care – PPO | Admitting: Certified Registered Nurse Anesthetist

## 2018-12-16 ENCOUNTER — Ambulatory Visit
Admission: RE | Admit: 2018-12-16 | Discharge: 2018-12-16 | Disposition: A | Discharge status: Home / Self Care | Payer: BC Managed Care – PPO | Attending: Gastroenterology | Admitting: Gastroenterology

## 2018-12-16 ENCOUNTER — Encounter
Admission: RE | Disposition: A | Discharge status: Home / Self Care | Payer: Self-pay | Source: Home / Self Care | Attending: Gastroenterology

## 2018-12-16 ENCOUNTER — Encounter: Payer: Self-pay | Admitting: *Deleted

## 2018-12-16 DIAGNOSIS — R12 Heartburn: Secondary | ICD-10-CM | POA: Insufficient documentation

## 2018-12-16 DIAGNOSIS — E785 Hyperlipidemia, unspecified: Secondary | ICD-10-CM | POA: Insufficient documentation

## 2018-12-16 DIAGNOSIS — Z955 Presence of coronary angioplasty implant and graft: Secondary | ICD-10-CM | POA: Diagnosis not present

## 2018-12-16 DIAGNOSIS — K2980 Duodenitis without bleeding: Secondary | ICD-10-CM | POA: Insufficient documentation

## 2018-12-16 DIAGNOSIS — I509 Heart failure, unspecified: Secondary | ICD-10-CM | POA: Insufficient documentation

## 2018-12-16 DIAGNOSIS — Z87891 Personal history of nicotine dependence: Secondary | ICD-10-CM | POA: Diagnosis not present

## 2018-12-16 DIAGNOSIS — K219 Gastro-esophageal reflux disease without esophagitis: Secondary | ICD-10-CM | POA: Insufficient documentation

## 2018-12-16 DIAGNOSIS — Z7982 Long term (current) use of aspirin: Secondary | ICD-10-CM | POA: Insufficient documentation

## 2018-12-16 DIAGNOSIS — J45909 Unspecified asthma, uncomplicated: Secondary | ICD-10-CM | POA: Insufficient documentation

## 2018-12-16 DIAGNOSIS — I272 Pulmonary hypertension, unspecified: Secondary | ICD-10-CM | POA: Insufficient documentation

## 2018-12-16 DIAGNOSIS — I2510 Atherosclerotic heart disease of native coronary artery without angina pectoris: Secondary | ICD-10-CM | POA: Diagnosis not present

## 2018-12-16 DIAGNOSIS — Z79899 Other long term (current) drug therapy: Secondary | ICD-10-CM | POA: Diagnosis not present

## 2018-12-16 DIAGNOSIS — I110 Hypertensive heart disease with heart failure: Secondary | ICD-10-CM | POA: Diagnosis not present

## 2018-12-16 HISTORY — PX: ESOPHAGOGASTRODUODENOSCOPY (EGD) WITH PROPOFOL: SHX5813

## 2018-12-16 SURGERY — ESOPHAGOGASTRODUODENOSCOPY (EGD) WITH PROPOFOL
Anesthesia: General

## 2018-12-16 MED ORDER — PROPOFOL 500 MG/50ML IV EMUL
INTRAVENOUS | Status: DC | PRN
  Administered 2018-12-16: 150 ug/kg/min via INTRAVENOUS

## 2018-12-16 MED ORDER — LIDOCAINE HCL (CARDIAC) PF 100 MG/5ML IV SOSY
PREFILLED_SYRINGE | INTRAVENOUS | Status: DC | PRN
  Administered 2018-12-16: 100 mg via INTRATRACHEAL

## 2018-12-16 MED ORDER — PROPOFOL 10 MG/ML IV BOLUS
INTRAVENOUS | Status: DC | PRN
  Administered 2018-12-16 (×2): 50 mg via INTRAVENOUS

## 2018-12-16 MED ORDER — MIDAZOLAM HCL 2 MG/2ML IJ SOLN
INTRAMUSCULAR | Status: DC | PRN
  Administered 2018-12-16: 2 mg via INTRAVENOUS

## 2018-12-16 MED ORDER — PROPOFOL 500 MG/50ML IV EMUL
INTRAVENOUS | Status: AC
  Filled 2018-12-16: qty 50

## 2018-12-16 MED ORDER — SODIUM CHLORIDE 0.9 % IV SOLN
INTRAVENOUS | Status: DC
  Administered 2018-12-16: 10:00:00 via INTRAVENOUS

## 2018-12-16 MED ORDER — LIDOCAINE HCL (PF) 2 % IJ SOLN
INTRAMUSCULAR | Status: AC
  Filled 2018-12-16: qty 10

## 2018-12-16 MED ORDER — MIDAZOLAM HCL 2 MG/2ML IJ SOLN
INTRAMUSCULAR | Status: AC
  Filled 2018-12-16: qty 2
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
## 2018-12-16 NOTE — Anesthesia Post-op Follow-up Note (Signed)
Anesthesia QCDR form completed.        

## 2018-12-16 NOTE — Anesthesia Postprocedure Evaluation (Signed)
Anesthesia Post Note  Patient: [name]  Procedure(s) Performed: ESOPHAGOGASTRODUODENOSCOPY (EGD) WITH PROPOFOL (N/A )  Patient location during evaluation: PACU Anesthesia Type: General Level of consciousness: awake and alert Pain management: pain level controlled Vital Signs Assessment: post-procedure vital signs reviewed and stable Respiratory status: spontaneous breathing, nonlabored ventilation, respiratory function stable and patient connected to nasal cannula oxygen Cardiovascular status: blood pressure returned to baseline and stable Postop Assessment: no apparent nausea or vomiting Anesthetic complications: no     Last Vitals:  Vitals:   12/16/18 1010 12/16/18 1020  BP: 112/82 121/89  Pulse: 66 62  Resp: 20 19  Temp:    SpO2: 96% 98%    Last Pain:  Vitals:   12/16/18 1005  TempSrc: Oral  PainSc:                  Molli Barrows
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
## 2018-12-16 NOTE — Transfer of Care (Signed)
Immediate Anesthesia Transfer of Care Note  Patient: Dale Kennedy  Procedure(s) Performed: ESOPHAGOGASTRODUODENOSCOPY (EGD) WITH PROPOFOL (N/A )  Patient Location: PACU  Anesthesia Type:General  Level of Consciousness: sedated  Airway & Oxygen Therapy: Patient Spontanous Breathing and Patient connected to nasal cannula oxygen  Post-op Assessment: Report given to RN and Post -op Vital signs reviewed and stable  Post vital signs: Reviewed and stable  Last Vitals:  Vitals Value Taken Time  BP 110/72 12/16/18 1005  Temp    Pulse 73 12/16/18 1006  Resp 22 12/16/18 1006  SpO2 95 % 12/16/18 1006  Vitals shown include unvalidated device data.  Last Pain:  Vitals:   12/16/18 0922  TempSrc: Oral  PainSc: 0-No pain         Complications: No apparent anesthesia complications

## 2018-12-16 NOTE — Op Note (Signed)
[facility] Gastroenterology Patient Name: Dale Kennedy Procedure Date: 12/16/2018 9:43 AM MRN: [id number] Account #: [id number] Date of Birth: [birth year] Admit Type: Outpatient Age: 43 Room: [facility] ENDO ROOM 4 Gender: Male Note Status: Finalized Procedure:            Upper GI endoscopy Indications:          Heartburn Providers:            [name] MD, MD Referring MD:         Dwana Curd. Marzetta Board (Referring MD) Medicines:            Monitored Anesthesia Care Complications:        No immediate complications. Procedure:            Pre-Anesthesia Assessment:                       - ASA Grade Assessment: II - A patient with mild                        systemic disease.                       After obtaining informed consent, the endoscope was                        passed under direct vision. Throughout the procedure,                        the patient's blood pressure, pulse, and oxygen                        saturations were monitored continuously. The Endoscope                        was introduced through the mouth, and advanced to the                        third part of duodenum. The upper GI endoscopy was                        accomplished with ease. The patient tolerated the                        procedure well. Findings:      The examined esophagus was normal. This was biopsied with a cold forceps       for histology.      The stomach was normal.      The cardia and gastric fundus were normal on retroflexion.      Localized mild inflammation characterized by congestion (edema) and       erythema was found in the second portion of the duodenum. Biopsies were       taken with a cold forceps for histology. Impression:           - Normal esophagus. Biopsied.                       - Normal stomach.                       - Duodenitis. Biopsied. Recommendation:       - Await pathology results.                       -  Discharge patient to home (with escort).                - Resume previous diet.                       - Continue present medications.                       - Return to my office in 3 weeks. Procedure Code(s):    --- Professional ---                       [phone], Esophagogastroduodenoscopy, flexible, transoral;                        with biopsy, single or multiple Diagnosis Code(s):    --- Professional ---                       K29.80, Duodenitis without bleeding                       R12, Heartburn CPT copyright 2019 American Medical Association. All rights reserved. The codes documented in this report are preliminary and upon coder review may  be revised to meet current compliance requirements. [name], MD [name] MD, MD 12/16/2018 10:05:16 AM This report has been signed electronically. Number of Addenda: 0 Note Initiated On: 12/16/2018 9:43 AM Estimated Blood Loss: Estimated blood loss: none.      [facility]

## 2018-12-19 ENCOUNTER — Encounter
Admit: 2018-12-19 | Discharge: 2018-12-20 | Disposition: A | Discharge status: Home / Self Care | Payer: BLUE CROSS/BLUE SHIELD | Attending: Emergency Medicine

## 2018-12-19 DIAGNOSIS — I252 Old myocardial infarction: Secondary | ICD-10-CM

## 2018-12-19 DIAGNOSIS — Z87891 Personal history of nicotine dependence: Secondary | ICD-10-CM

## 2018-12-19 DIAGNOSIS — Z7902 Long term (current) use of antithrombotics/antiplatelets: Secondary | ICD-10-CM

## 2018-12-19 DIAGNOSIS — R002 Palpitations: Secondary | ICD-10-CM

## 2018-12-19 DIAGNOSIS — I10 Essential (primary) hypertension: Secondary | ICD-10-CM

## 2018-12-19 DIAGNOSIS — R0789 Other chest pain: Secondary | ICD-10-CM

## 2018-12-19 DIAGNOSIS — R079 Chest pain, unspecified: Secondary | ICD-10-CM

## 2018-12-19 DIAGNOSIS — I2510 Atherosclerotic heart disease of native coronary artery without angina pectoris: Secondary | ICD-10-CM

## 2018-12-19 DIAGNOSIS — R42 Dizziness and giddiness: Secondary | ICD-10-CM

## 2018-12-19 DIAGNOSIS — Z7982 Long term (current) use of aspirin: Secondary | ICD-10-CM

## 2018-12-19 DIAGNOSIS — Z6831 Body mass index (BMI) 31.0-31.9, adult: Secondary | ICD-10-CM | POA: Diagnosis not present

## 2018-12-19 LAB — SURGICAL PATHOLOGY

## 2018-12-20 DIAGNOSIS — R079 Chest pain, unspecified: Secondary | ICD-10-CM | POA: Diagnosis not present

## 2018-12-25 ENCOUNTER — Encounter: Payer: Self-pay | Admitting: Gastroenterology

## 2018-12-25 MED ORDER — EZETIMIBE 10 MG TABLET
ORAL_TABLET | Freq: Every evening | ORAL | 11 refills | 30.00000 days | Status: CP
Start: 2018-12-25 — End: ?

## 2018-12-25 NOTE — Progress Notes (Signed)
ok 

## 2019-01-02 ENCOUNTER — Telehealth: Payer: Self-pay

## 2019-01-03 DIAGNOSIS — Z7982 Long term (current) use of aspirin: Secondary | ICD-10-CM

## 2019-01-03 DIAGNOSIS — K219 Gastro-esophageal reflux disease without esophagitis: Secondary | ICD-10-CM

## 2019-01-03 DIAGNOSIS — Z79899 Other long term (current) drug therapy: Secondary | ICD-10-CM

## 2019-01-03 DIAGNOSIS — R079 Chest pain, unspecified: Secondary | ICD-10-CM

## 2019-01-03 DIAGNOSIS — E785 Hyperlipidemia, unspecified: Secondary | ICD-10-CM

## 2019-01-03 DIAGNOSIS — Z87891 Personal history of nicotine dependence: Secondary | ICD-10-CM

## 2019-01-03 DIAGNOSIS — I4891 Unspecified atrial fibrillation: Secondary | ICD-10-CM

## 2019-01-03 DIAGNOSIS — G4733 Obstructive sleep apnea (adult) (pediatric): Secondary | ICD-10-CM

## 2019-01-03 DIAGNOSIS — R0789 Other chest pain: Secondary | ICD-10-CM

## 2019-01-03 DIAGNOSIS — F419 Anxiety disorder, unspecified: Secondary | ICD-10-CM

## 2019-01-03 DIAGNOSIS — I2510 Atherosclerotic heart disease of native coronary artery without angina pectoris: Secondary | ICD-10-CM

## 2019-01-03 DIAGNOSIS — Z955 Presence of coronary angioplasty implant and graft: Secondary | ICD-10-CM

## 2019-01-03 DIAGNOSIS — I10 Essential (primary) hypertension: Secondary | ICD-10-CM

## 2019-01-03 DIAGNOSIS — Z6831 Body mass index (BMI) 31.0-31.9, adult: Secondary | ICD-10-CM | POA: Diagnosis not present

## 2019-01-03 NOTE — Telephone Encounter (Signed)
Pt is scheduled °

## 2019-01-04 ENCOUNTER — Encounter: Payer: Self-pay | Admitting: Family Medicine

## 2019-01-04 ENCOUNTER — Other Ambulatory Visit: Payer: Self-pay

## 2019-01-04 ENCOUNTER — Encounter
Admit: 2019-01-04 | Discharge: 2019-01-04 | Disposition: A | Discharge status: Home / Self Care | Payer: BLUE CROSS/BLUE SHIELD | Attending: Emergency Medicine

## 2019-01-04 DIAGNOSIS — R0789 Other chest pain: Secondary | ICD-10-CM | POA: Diagnosis not present
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
## 2019-01-05 ENCOUNTER — Encounter: Payer: Self-pay | Admitting: Family Medicine

## 2019-01-05 ENCOUNTER — Other Ambulatory Visit: Payer: Self-pay

## 2019-01-05 ENCOUNTER — Encounter: Payer: Self-pay | Admitting: Gastroenterology

## 2019-01-05 ENCOUNTER — Ambulatory Visit (INDEPENDENT_AMBULATORY_CARE_PROVIDER_SITE_OTHER): Payer: BC Managed Care – PPO | Admitting: Gastroenterology

## 2019-01-05 ENCOUNTER — Encounter (INDEPENDENT_AMBULATORY_CARE_PROVIDER_SITE_OTHER): Payer: BC Managed Care – PPO | Admitting: Gastroenterology

## 2019-01-05 ENCOUNTER — Ambulatory Visit (INDEPENDENT_AMBULATORY_CARE_PROVIDER_SITE_OTHER): Payer: BC Managed Care – PPO | Admitting: Family Medicine

## 2019-01-05 VITALS — BP 118/74

## 2019-01-05 DIAGNOSIS — G4700 Insomnia, unspecified: Secondary | ICD-10-CM

## 2019-01-05 DIAGNOSIS — R634 Abnormal weight loss: Secondary | ICD-10-CM

## 2019-01-05 DIAGNOSIS — R0789 Other chest pain: Secondary | ICD-10-CM | POA: Diagnosis not present

## 2019-01-05 DIAGNOSIS — F419 Anxiety disorder, unspecified: Secondary | ICD-10-CM | POA: Diagnosis not present

## 2019-01-05 DIAGNOSIS — F329 Major depressive disorder, single episode, unspecified: Secondary | ICD-10-CM | POA: Diagnosis not present

## 2019-01-05 MED ORDER — SERTRALINE HCL 25 MG PO TABS: 25.0000 mg | ORAL_TABLET | Freq: Every day | ORAL | 1 refills | Status: DC

## 2019-01-05 NOTE — Progress Notes (Signed)
[name] MD, MRCP(U.K) [address]  [place]  [place], [place] [address]  Main: [phone]  Fax: [phone]   Primary Care Physician: Delsa Grana, PA-C  Primary Gastroenterologist:  Dr. [name]   No chief complaint on file.   HPI: Dale Kennedy is a 43 y.o. male   Summary of history :  He was initially seen on 04/21/16 for abnormal LFT's, including a positive smooth muscle antibody . He also did have an iron deficiency anemia. Lastfewyears has had an elevated alkaline phosphotase with normal transaminases .Fractionated components have been checked on two occasions show a mildly elevated liver component with normal bone components. GGT too has been mildly elevated in the past . RUQ USG shows no abnormality and normal bile ducts.In April 2020 he was treated for IBS D with Xifaxan.  He was seen on 11/03/2018 for discomfort in his throat and center of his chest only when he eats he had been taking his PPI after his meals.  Despite taking his PPI twice a day he says that the chest discomfort has not improved.  There is in fact while he takes the PPI things are actually worse.  He was told by his cardiologist to stop the Plavix.  He was suggested to get further GI evaluation.  04/2016-MRCP normal  05/2016- Hb 13.1  HCV ab ,urine analysis , HIV ab ,celiac serology,HvsAgANA,AMA -negative Ceruloplasmin,A1AT - normal  F actin weakly positive 27  CRP elevated 2.6  Smooth muscle antibody has been elevated at 23 and 29 on two occasions when checked. 04/07/2018 : H pylori breath test - negative.  03/2018 : HB 13 grams  02/2018 : LFT's normal  05/25/2018 : Hb stable at 12.6 grams . Black stool likely due to taking Peptobismol.  05/2016 EGD- normal with duodenal biopsies Colonoscopy -few ulcers seen on the ileo cecal valve - Normal TI biopsy,IC valve showed mild acute inflammation -no chronic changes. 10/2016 : Capsule study normal   Seen by Dr Grayland Ormond in 9/2090for  abnormal elevated kappa light chains , microcytosis , placed on oral iron. Was previously treated for IBS D with Xifaxan in April 2020.  Interval history 11/24/2018-01/05/2019 ED visit on 01/03/2019 for atypical chest pain.  12/19/2018 similar presentation to [place] [facility].  12/16/2018: EGD: Formation seen in the duodenum normal biopsies He says that the abdominal pain is in the right side of his chest usually after he eats associated with bloating and abdominal distention.  Denies any consumption of artificial sugars.  He was taking PPI twice a day but said that he developed diarrhea and hence stopped taking it.     Current Outpatient Medications  Medication Sig Dispense Refill  . acetaminophen (TYLENOL) 500 MG tablet Take 1,000 mg by mouth every 6 (six) hours as needed for mild pain.    Marland Kitchen albuterol (VENTOLIN HFA) 108 (90 Base) MCG/ACT inhaler Inhale 2 puffs into the lungs every 6 (six) hours as needed for wheezing or shortness of breath. 1 Inhaler 0  . aspirin 81 MG tablet Take 81 mg by mouth daily.    . chlorthalidone (HYGROTON) 25 MG tablet     . co-enzyme Q-10 30 MG capsule Take 30 mg by mouth daily.    Marland Kitchen ezetimibe (ZETIA) 10 MG tablet TAKE 1 TABLET BY MOUTH EVERY MORNING FOR CHOLESTEROL 30 tablet 0  . famotidine (PEPCID) 20 MG tablet Take 1 tablet (20 mg total) by mouth daily. 90 tablet 3  . FLOVENT DISKUS 50 MCG/BLIST  diskus inhaler Inhale 1 puff into the lungs as needed.     Derrill Memo ON 01/15/2019] HYDROcodone-acetaminophen (NORCO) 10-325 MG tablet Take 1 tablet by mouth every 8 (eight) hours as needed for up to 5 days. 15 tablet 0  . loratadine (CLARITIN) 10 MG tablet TAKE 1 TABLET BY MOUTH ONCE DAILY AS NEEDED FOR ALLERGIES (Patient taking differently: Take 10 mg by mouth daily as needed for allergies. ) 30 tablet 11  . metoprolol succinate (TOPROL-XL) 25 MG 24 hr tablet Take 25 mg by mouth daily.     . nitroGLYCERIN (NITROSTAT) 0.4 MG SL tablet Place 1 tablet under the tongue  as needed.  2  . OxyCODONE HCl, Abuse Deter, (OXAYDO) 5 MG TABA Take by mouth.    . rosuvastatin (CRESTOR) 10 MG tablet     . sertraline (ZOLOFT) 25 MG tablet Take 25 mg by mouth daily.     No current facility-administered medications for this visit.     Allergies as of 01/05/2019 - Review Complete 01/05/2019  Allergen Reaction Noted  . Ace inhibitors Swelling 12/24/2016    ROS:  General: Negative for anorexia, weight loss, fever, chills, fatigue, weakness. ENT: Negative for hoarseness, difficulty swallowing , nasal congestion. CV: Negative for chest pain, angina, palpitations, dyspnea on exertion, peripheral edema.  Respiratory: Negative for dyspnea at rest, dyspnea on exertion, cough, sputum, wheezing.  GI: See history of present illness. GU:  Negative for dysuria, hematuria, urinary incontinence, urinary frequency, nocturnal urination.  Endo: Negative for unusual weight change.    Physical Examination:   There were no vitals taken for this visit.  General: Well-nourished, well-developed in no acute distress.  Eyes: No icterus. Conjunctivae pink. Mouth: Oropharyngeal mucosa moist and pink , no lesions erythema or exudate. Lungs: Clear to auscultation bilaterally. Non-labored. Heart: Regular rate and rhythm, no murmurs rubs or gallops.  Abdomen: Bowel sounds are normal, nontender, nondistended, no hepatosplenomegaly or masses, no abdominal bruits or hernia , no rebound or guarding.   Extremities: No lower extremity edema. No clubbing or deformities. Neuro: Alert and oriented x 3.  Grossly intact. Skin: Warm and dry, no jaundice.   Psych: Alert and cooperative, normal mood and affect.   Imaging Studies: No results found.  Assessment and Plan:   Dale Kennedy is a 43 y.o. y/o male here to see me foracid reflux. History ofIBS-D[treated with a course of Xifaxan],microcytic iron def anemia which no cause was found on GI evaluation. Follows with Dr Grayland Ormond . I have seen  him for atypical chest pain requiring multiple ER visits.  It is usually after meals.  And associated with bloating.  It could be gas related pain versus pain from his gallbladder.  It could also be  an esophageal hypersensitivity syndrome or nonacid reflux from bile salts.  Plan 1.  HIDA scan to evaluate the gallbladder  2.  Commence on Dexilant samples are provided. 3.  Stop all artificial sugars and commenced on a low FODMAP diet for his gas. 4.  Commence on activated charcoal tablets as needed. 5.  If above interventions do not help with her to consider esophageal manometry to rule out esophageal spasms, consider a trial of amitriptyline for esophageal hypersensitivity syndrome as a pain modulator.  Other option will also to empirically treat him for small bowel bacterial overgrowth syndrome as he has a lot of gas. 6.  He also says he has a follow-up with his cardiologist.  Dr [name]  MD,MRCP [facility]) Follow up in 2  weeks

## 2019-01-05 NOTE — Patient Instructions (Addendum)
Your [facility] scan is scheduled for 8:00am on 01/16/2019. Please arrive at the medical mall at [place] after midnight  Low-Fiber Eating Plan Fiber is found in fruits, vegetables, whole grains, and beans. Eating a diet low in fiber helps to reduce how often you have bowel movements and how much you produce during a bowel movement. A low-fiber eating plan may help your digestive system heal if:  You have certain conditions, such as Crohn's disease or diverticulitis.  You recently had radiation therapy on your pelvis or bowel.  You recently had intestinal surgery.  You have a new surgical opening in your abdomen (colostomy or ileostomy).  Your intestine is narrowed (stricture). Your health care provider will determine how long you need to stay on this diet. Your health care provider may recommend that you work with a diet and nutrition specialist (dietitian). What are tips for following this plan? General guidelines  Follow recommendations from your dietitian about how much fiber you should have each day.  Most people on this eating plan should try to eat less than 10 grams (g) of fiber each day. Your daily fiber goal is _________________ g.  Take vitamin and mineral supplements as told by your health care provider or dietitian. Chewable or liquid forms are best when on this eating plan. Reading food labels  Check food labels for the amount of dietary fiber.  Choose foods that have less than 2 grams of fiber in one serving. Cooking  Use white flour and other allowed grains for baking and cooking.  Cook meat using methods that keep it tender, such as braising or poaching.  Cook eggs until the yolk is completely solid.  Cook with healthy oils, such as olive oil or canola oil. Meal planning   Eat 5-6 small meals throughout the day instead of 3 large meals.  If you are lactose intolerant: ? Choose low-lactose dairy foods. ? Do not eat dairy foods, if told by your dietitian.   Limit fat and oils to less than 8 teaspoons a day.  Eat small portions of desserts. What foods are allowed? The items listed below may not be a complete list. Talk with your dietitian about what dietary choices are best for you. Grains All bread and crackers made with white flour. Waffles, pancakes, and [place] toast. Bagels. Pretzels. Melba toast, zwieback, and matzoh. Cooked and dried cereals that do not contain whole grains, added fiber, seeds, or dried fruit. CornmealDomenick Gong. Hot and cold cereals made with refined corn, wheat, rice, or oats. Plain pasta and noodles. White rice. Vegetables Well-cooked or canned vegetables without skin, seeds, or stems. Cooked potatoes without skins. Vegetable juice. Fruits Soft-cooked or canned fruits without skin and seeds. Peeled ripe banana. Applesauce. Fruit juice without pulp. Meats and other protein foods Ground meat. Tender cuts of meat or poultry. Eggs. Fish, seafood, and shellfish. Smooth nut butters. Tofu. Dairy All milk products and drinks. Lactose-free milks, including rice, soy, and almond milks. Yogurt without fruit, nuts, chocolate, or granola mix-ins. Sour cream. Cottage cheese. Cheese. Beverages Decaf coffee. Fruit and vegetable juices or smoothies (in small amounts, with no pulp or skins, and with fruits from allowed list). Sports drinks. Herbal tea. Fats and oils Olive oil, canola oil, sunflower oil, flaxseed oil, and grapeseed oil. Mayonnaise. Cream cheese. Margarine. Butter. Sweets and desserts Plain cakes and cookies. Cream pies and pies made with allowed fruits. Pudding. Custard. Fruit gelatin. Sherbet. Popsicles. Ice cream without nuts. Plain hard candy. Honey. Jelly. Molasses. Syrups, including  chocolate syrup. Chocolate. Marshmallows. Gumdrops. Seasoning and other foods Bouillon. Broth. Cream soups made from allowed foods. Strained soup. Casseroles made with allowed foods. Ketchup. Mild mustard. Mild salad dressings. Plain gravies.  Vinegar. Spices in moderation. Salt. Sugar. What foods are not allowed? The items listed below may not be a complete list. Talk with your dietitian about what dietary choices are best for you. Grains Whole wheat and whole grain breads and crackers. Multigrain breads and crackers. Rye bread. Whole grain or multigrain cereals. Cereals with nuts, raisins, or coconut. Bran. Coarse wheat cereals. Granola. High-fiber cereals. Cornmeal or corn bread. Whole grain pasta. Wild or brown rice. Quinoa. Popcorn. Buckwheat. Wheat germ. Vegetables Potato skins. Raw or undercooked vegetables. All beans and bean sprouts. Cooked greens. Corn. Peas. Cabbage. Beets. Broccoli. Brussels sprouts. Cauliflower. Mushrooms. Onions. Peppers. Parsnips. Okra. Sauerkraut. Fruit Raw or dried fruit. Berries. Fruit juice with pulp. Prune juice. Meats and other protein foods Tough, fibrous meats with gristle. Fatty meat. Poultry with skin. Fried meat, Sales executive, or fish. Deli or lunch meats. Sausage, bacon, and hot dogs. Nuts and chunky nut butter. Dried peas, beans, and lentils. Dairy Yogurt with fruit, nuts, chocolate, or granola mix-ins. Beverages Caffeinated coffee and teas. Fats and oils Avocado. Coconut. Sweets and desserts Desserts, cookies, or candies that contain nuts or coconut. Dried fruit. Jams and preserves with seeds. Marmalade. Any dessert made with fruits or grains that are not allowed. Seasoning and other foods Corn tortilla chips. Soups made with vegetables or grains that are not allowed. Relish. Horseradish. Angie Fava. Olives. Summary  Most people on a low-fiber eating plan should eat less than 10 grams of fiber a day. Follow recommendations from your dietitian about how much fiber you should have each day.  Always check food labels to see the dietary fiber content of packaged foods. In general, a low-fiber food will have fewer than 2 grams of fiber per serving.  In general, try to avoid whole grains, raw  fruits and vegetables, dried fruit, tough cuts of meat, nuts, and seeds.  Take a vitamin and mineral supplement as told by your health care provider or dietitian. This information is not intended to replace advice given to you by your health care provider. Make sure you discuss any questions you have with your health care provider. Document Released: 09/12/2001 Document Revised: 07/15/2018 Document Reviewed: 05/26/2016 Elsevier Patient Education  2020 [employer].

## 2019-01-09 ENCOUNTER — Ambulatory Visit: Payer: BC Managed Care – PPO | Admitting: Gastroenterology

## 2019-01-12 ENCOUNTER — Telehealth: Payer: Self-pay | Admitting: Gastroenterology

## 2019-01-12 ENCOUNTER — Ambulatory Visit
Admit: 2019-01-12 | Discharge: 2019-01-13 | Disposition: A | Discharge status: Home / Self Care | Payer: BLUE CROSS/BLUE SHIELD

## 2019-01-12 DIAGNOSIS — K219 Gastro-esophageal reflux disease without esophagitis: Secondary | ICD-10-CM

## 2019-01-12 DIAGNOSIS — Z955 Presence of coronary angioplasty implant and graft: Secondary | ICD-10-CM

## 2019-01-12 DIAGNOSIS — R079 Chest pain, unspecified: Secondary | ICD-10-CM

## 2019-01-12 DIAGNOSIS — R0789 Other chest pain: Secondary | ICD-10-CM | POA: Diagnosis not present

## 2019-01-12 DIAGNOSIS — R1013 Epigastric pain: Secondary | ICD-10-CM | POA: Diagnosis not present

## 2019-01-12 DIAGNOSIS — Z6829 Body mass index (BMI) 29.0-29.9, adult: Secondary | ICD-10-CM | POA: Diagnosis not present

## 2019-01-12 MED ORDER — SUCRALFATE 1 GRAM TABLET
ORAL_TABLET | Freq: Four times a day (QID) | ORAL | 0 refills | 7 days | Status: CP
Start: 2019-01-12 — End: 2019-01-19

## 2019-01-13 NOTE — Telephone Encounter (Signed)
Please inform him that we will refill his Dexilant.  Commence on Carafate 4 times daily.  Tell him if this does not work we may need to talk about starting him on calcium channel blockers but have discussed that his cardiologist as he is on multiple medications which interact with this.

## 2019-01-16 ENCOUNTER — Other Ambulatory Visit: Payer: Self-pay

## 2019-01-16 MED ORDER — SUCRALFATE 1 GM/10ML PO SUSP: 1.0000 g | Freq: Three times a day (TID) | ORAL | 2 refills | Status: DC

## 2019-01-16 MED ORDER — DEXILANT 60 MG PO CPDR: 60.0000 mg | DELAYED_RELEASE_CAPSULE | Freq: Every day | ORAL | 3 refills | Status: DC

## 2019-01-16 MED ORDER — METOPROLOL TARTRATE 25 MG TABLET: 25 mg | tablet | Freq: Two times a day (BID) | 5 refills | 30 days | Status: AC

## 2019-01-24 ENCOUNTER — Encounter
Admit: 2019-01-24 | Discharge: 2019-01-24 | Disposition: A | Discharge status: Home / Self Care | Payer: BLUE CROSS/BLUE SHIELD

## 2019-01-24 DIAGNOSIS — M4722 Other spondylosis with radiculopathy, cervical region: Principal | ICD-10-CM

## 2019-01-24 DIAGNOSIS — R079 Chest pain, unspecified: Principal | ICD-10-CM

## 2019-01-24 DIAGNOSIS — I4891 Unspecified atrial fibrillation: Secondary | ICD-10-CM | POA: Diagnosis not present

## 2019-01-24 DIAGNOSIS — E785 Hyperlipidemia, unspecified: Secondary | ICD-10-CM | POA: Diagnosis not present

## 2019-01-24 DIAGNOSIS — Z79899 Other long term (current) drug therapy: Secondary | ICD-10-CM | POA: Diagnosis not present

## 2019-01-24 DIAGNOSIS — I2510 Atherosclerotic heart disease of native coronary artery without angina pectoris: Secondary | ICD-10-CM | POA: Diagnosis not present

## 2019-01-24 DIAGNOSIS — M5412 Radiculopathy, cervical region: Secondary | ICD-10-CM | POA: Diagnosis not present

## 2019-01-24 DIAGNOSIS — M4723 Other spondylosis with radiculopathy, cervicothoracic region: Secondary | ICD-10-CM | POA: Diagnosis not present

## 2019-01-24 DIAGNOSIS — G4733 Obstructive sleep apnea (adult) (pediatric): Secondary | ICD-10-CM | POA: Diagnosis not present

## 2019-01-24 DIAGNOSIS — M5030 Other cervical disc degeneration, unspecified cervical region: Secondary | ICD-10-CM | POA: Diagnosis not present

## 2019-01-24 DIAGNOSIS — Z7982 Long term (current) use of aspirin: Secondary | ICD-10-CM | POA: Diagnosis not present

## 2019-01-24 DIAGNOSIS — K2100 Gastro-esophageal reflux disease with esophagitis, without bleeding: Secondary | ICD-10-CM | POA: Diagnosis not present

## 2019-01-24 DIAGNOSIS — M47812 Spondylosis without myelopathy or radiculopathy, cervical region: Secondary | ICD-10-CM | POA: Diagnosis not present

## 2019-01-24 DIAGNOSIS — I10 Essential (primary) hypertension: Secondary | ICD-10-CM | POA: Diagnosis not present

## 2019-01-24 DIAGNOSIS — F419 Anxiety disorder, unspecified: Secondary | ICD-10-CM | POA: Diagnosis not present

## 2019-01-24 DIAGNOSIS — Z87891 Personal history of nicotine dependence: Secondary | ICD-10-CM | POA: Diagnosis not present

## 2019-01-24 DIAGNOSIS — Z955 Presence of coronary angioplasty implant and graft: Secondary | ICD-10-CM | POA: Diagnosis not present

## 2019-01-25 ENCOUNTER — Encounter
Admit: 2019-01-25 | Discharge: 2019-01-25 | Disposition: A | Discharge status: Home / Self Care | Payer: BLUE CROSS/BLUE SHIELD

## 2019-01-25 DIAGNOSIS — R079 Chest pain, unspecified: Principal | ICD-10-CM

## 2019-01-25 DIAGNOSIS — I10 Essential (primary) hypertension: Secondary | ICD-10-CM | POA: Diagnosis not present

## 2019-01-25 DIAGNOSIS — Z87891 Personal history of nicotine dependence: Secondary | ICD-10-CM | POA: Diagnosis not present

## 2019-01-25 DIAGNOSIS — Z7982 Long term (current) use of aspirin: Secondary | ICD-10-CM | POA: Diagnosis not present

## 2019-01-25 DIAGNOSIS — R0789 Other chest pain: Secondary | ICD-10-CM | POA: Diagnosis not present

## 2019-01-25 DIAGNOSIS — F419 Anxiety disorder, unspecified: Secondary | ICD-10-CM | POA: Diagnosis not present

## 2019-01-25 DIAGNOSIS — Z888 Allergy status to other drugs, medicaments and biological substances status: Secondary | ICD-10-CM | POA: Diagnosis not present

## 2019-01-25 DIAGNOSIS — R202 Paresthesia of skin: Secondary | ICD-10-CM | POA: Diagnosis not present

## 2019-01-25 DIAGNOSIS — G4733 Obstructive sleep apnea (adult) (pediatric): Secondary | ICD-10-CM | POA: Diagnosis not present

## 2019-01-25 DIAGNOSIS — Z955 Presence of coronary angioplasty implant and graft: Secondary | ICD-10-CM | POA: Diagnosis not present

## 2019-01-25 DIAGNOSIS — K219 Gastro-esophageal reflux disease without esophagitis: Secondary | ICD-10-CM | POA: Diagnosis not present

## 2019-01-25 DIAGNOSIS — M79602 Pain in left arm: Secondary | ICD-10-CM | POA: Diagnosis not present

## 2019-01-25 DIAGNOSIS — Z79899 Other long term (current) drug therapy: Secondary | ICD-10-CM | POA: Diagnosis not present

## 2019-01-25 DIAGNOSIS — I2510 Atherosclerotic heart disease of native coronary artery without angina pectoris: Secondary | ICD-10-CM | POA: Diagnosis not present

## 2019-01-25 DIAGNOSIS — I4891 Unspecified atrial fibrillation: Secondary | ICD-10-CM | POA: Diagnosis not present

## 2019-01-26 ENCOUNTER — Encounter: Payer: Self-pay | Admitting: Family Medicine

## 2019-01-29 NOTE — Telephone Encounter (Signed)
Inform  Options  1. Refer to [facility]  for impedence and manometry testing  2. Course of xifaxan for IBS-D while we are waiting for the results for 10 days

## 2019-01-31 ENCOUNTER — Telehealth: Payer: Self-pay

## 2019-01-31 ENCOUNTER — Other Ambulatory Visit: Payer: Self-pay

## 2019-01-31 DIAGNOSIS — Z789 Other specified health status: Principal | ICD-10-CM

## 2019-01-31 DIAGNOSIS — I2510 Atherosclerotic heart disease of native coronary artery without angina pectoris: Principal | ICD-10-CM

## 2019-01-31 DIAGNOSIS — B9789 Other viral agents as the cause of diseases classified elsewhere: Secondary | ICD-10-CM

## 2019-01-31 DIAGNOSIS — J4520 Mild intermittent asthma, uncomplicated: Secondary | ICD-10-CM

## 2019-01-31 DIAGNOSIS — J988 Other specified respiratory disorders: Secondary | ICD-10-CM

## 2019-01-31 MED ORDER — ALBUTEROL SULFATE HFA 108 (90 BASE) MCG/ACT IN AERS: 2.0000 | INHALATION_SPRAY | Freq: Four times a day (QID) | RESPIRATORY_TRACT | 0 refills | Status: DC | PRN

## 2019-01-31 MED ORDER — PRALUENT PEN 150 MG/ML SUBCUTANEOUS PEN INJECTOR
INJECTION | SUBCUTANEOUS | 5 refills | 28 days | Status: CP
Start: 2019-01-31 — End: ?

## 2019-01-31 NOTE — Telephone Encounter (Signed)
Spoke with pt and informed him of Dr. Georgeann Oppenheim recommendations. Pt agrees and plans to pick up Xifaxan samples tomorrow during his follow up visit with Dr. Vicente Males. Pt is aware of manometry appointment information.

## 2019-02-01 ENCOUNTER — Other Ambulatory Visit: Payer: Self-pay

## 2019-02-01 ENCOUNTER — Ambulatory Visit: Payer: BC Managed Care – PPO | Admitting: Gastroenterology

## 2019-02-01 ENCOUNTER — Encounter: Payer: Self-pay | Admitting: Gastroenterology

## 2019-02-01 VITALS — BP 108/72 | HR 61 | Temp 97.6°F | Ht 72.0 in | Wt 226.4 lb

## 2019-02-01 DIAGNOSIS — R0789 Other chest pain: Secondary | ICD-10-CM

## 2019-02-01 NOTE — Progress Notes (Signed)
[name] MD, MRCP(U.K) [address]  [place]  [place], [place] [address]  Main: [phone]  Fax: [phone]   Primary Care Physician: Delsa Grana, PA-C  Primary Gastroenterologist:  Dr. [name]   No chief complaint on file.   HPI: Dale Kennedy is a 43 y.o. male    Summary of history :  He was initially seen on 04/21/16 for abnormal LFT's, including a positive smooth muscle antibody . He also did have an iron deficiency anemia. Lastfewyears has had an elevated alkaline phosphotase with normal transaminases .Fractionated components have been checked on two occasions show a mildly elevated liver component with normal bone components. GGT too has been mildly elevated in the past . RUQ USG shows no abnormality and normal bile ducts.In April 2020 he was treated for IBS D with Xifaxan.  He was seen on 11/03/2018 for discomfort in his throat and center of his chest only when he eats Despite taking his PPI twice a day he says that the chest discomfort has not improved. In fact while he takes the PPI things are actually worse. He was told by his cardiologist to stop the Plavix. He was suggested to get further GI evaluation.  04/2016-MRCP normal  05/2016- Hb 13.1  HCV ab ,urine analysis , HIV ab ,celiac serology,HvsAgANA,AMA -negative Ceruloplasmin,A1AT - normal  F actin weakly positive 27  CRP elevated 2.6  Smooth muscle antibody has been elevated at 23 and 29 on two occasions when checked. 04/07/2018 : H pylori breath test - negative.  03/2018 : HB 13 grams  02/2018 : LFT's normal  05/25/2018 : Hb stable at 12.6 grams . Black stool likely due to taking Peptobismol.  05/2016 EGD- normal with duodenal biopsies Colonoscopy -few ulcers seen on the ileo cecal valve - Normal TI biopsy,IC valve showed mild acute inflammation -no chronic changes. 10/2016 : Capsule study normal  12/16/2018: EGD: Normal biopsies of the esophagus.  No evidence of eosinophilic  esophagitis.  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   Seen by Dr Grayland Ormond in 9/2067for abnormal elevated kappa light chains , microcytosis , placed on oral iron. Was previously treated for IBS D with Xifaxan in April 2020. ED visit on 01/03/2019 for atypical chest pain.  12/19/2018 similar presentation to [facility] at [facility]   Interval history 01/05/2019-02/01/2019  01/12/2019 presented at [facility] emergency department with chest pain.  Cardiac issues ruled out and discharged. Similar presentations on 01/24/2019 and 01/25/2019 to the emergency room.  Discharged home to follow-up with cardiology.  He is on sertraline for anxiety. Did not obtain HIDA scan  He does not have any pain and states the pain only comes on when he eats.  Tight sensation in his chest.   Current Outpatient Medications  Medication Sig Dispense Refill  . acetaminophen (TYLENOL) 500 MG tablet Take 1,000 mg by mouth every 6 (six) hours as needed for mild pain.    Marland Kitchen albuterol (VENTOLIN HFA) 108 (90 Base) MCG/ACT inhaler Inhale 2 puffs into the lungs every 6 (six) hours as needed for wheezing or shortness of breath. 18 g 0  . aspirin 81 MG tablet Take 81 mg by mouth daily.    . chlorthalidone (HYGROTON) 25 MG tablet     . co-enzyme Q-10 30 MG capsule Take 30 mg by mouth daily.    Marland Kitchen dexlansoprazole (DEXILANT) 60 MG capsule Take 1 capsule (60 mg total) by mouth daily. 90 capsule 3  .  ezetimibe (ZETIA) 10 MG tablet TAKE 1 TABLET BY MOUTH EVERY MORNING FOR CHOLESTEROL 30 tablet 0  . famotidine (PEPCID) 20 MG tablet Take 1 tablet (20 mg total) by mouth daily. 90 tablet 3  . FLOVENT DISKUS 50 MCG/BLIST diskus inhaler Inhale 1 puff into the lungs as needed.     . loratadine (CLARITIN) 10 MG tablet TAKE 1 TABLET BY MOUTH ONCE DAILY AS NEEDED FOR ALLERGIES (Patient taking differently: Take 10 mg by mouth daily as needed for allergies. ) 30 tablet  11  . metoprolol succinate (TOPROL-XL) 25 MG 24 hr tablet Take 25 mg by mouth daily.     . nitroGLYCERIN (NITROSTAT) 0.4 MG SL tablet Place 1 tablet under the tongue as needed.  2  . OxyCODONE HCl, Abuse Deter, (OXAYDO) 5 MG TABA Take by mouth.    . rosuvastatin (CRESTOR) 10 MG tablet     . sertraline (ZOLOFT) 25 MG tablet Take 1 tablet (25 mg total) by mouth daily. 90 tablet 1  . sucralfate (CARAFATE) 1 GM/10ML suspension Take 10 mLs (1 g total) by mouth 4 (four) times daily -  with meals and at bedtime. 1200 mL 2   No current facility-administered medications for this visit.     Allergies as of 02/01/2019 - Review Complete 01/05/2019  Allergen Reaction Noted  . Ace inhibitors Swelling 12/24/2016    ROS:  General: Negative for anorexia, weight loss, fever, chills, fatigue, weakness. ENT: Negative for hoarseness, difficulty swallowing , nasal congestion. CV: Negative for chest pain, angina, palpitations, dyspnea on exertion, peripheral edema.  Respiratory: Negative for dyspnea at rest, dyspnea on exertion, cough, sputum, wheezing.  GI: See history of present illness. GU:  Negative for dysuria, hematuria, urinary incontinence, urinary frequency, nocturnal urination.  Endo: Negative for unusual weight change.    Physical Examination:   There were no vitals taken for this visit.  General: Well-nourished, well-developed in no acute distress.  Eyes: No icterus. Conjunctivae pink. Mouth: Oropharyngeal mucosa moist and pink , no lesions erythema or exudate. Lungs: Clear to auscultation bilaterally. Non-labored. Heart: Regular rate and rhythm, no murmurs rubs or gallops.  Abdomen: Bowel sounds are normal, nontender, nondistended, no hepatosplenomegaly or masses, no abdominal bruits or hernia , no rebound or guarding.   Extremities: No lower extremity edema. No clubbing or deformities. Neuro: Alert and oriented x 3.  Grossly intact. Skin: Warm and dry, no jaundice.   Psych: Alert and  cooperative, normal mood and affect.   Imaging Studies: No results found.  Assessment and Plan:   Dale Kennedy is a 43 y.o. y/o male here to see me for possible noncardiac chest pain versus functional pain/esophageal hypersensitivity syndrome.  He has ahistory ofIBS-D[treated with a course of Xifaxan],microcytic iron def anemia which no cause was found on GI evaluation. Follows with Dr Grayland Ormond .   He has had multiple ER visits for the chest discomfort.  It is usually after meals.  And associated with bloating.  It could be gas related pain versus pain from his gallbladder.  It could also be  an esophageal hypersensitivity syndrome or nonacid reflux from bile salts.  Plan 1.  HIDA scan to evaluate the gallbladder ordered previously but not yet obtained 2.  Scheduled for esophageal manometry at [facility]  on 02/08/2019.  He would also require impedance pH testing off PPI.  It probably is a good idea for him to see Dr. Silverio Decamp as well after the esophageal manometry results as have had no success with maximum  dose PPI, Carafate, sertraline.   3.  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

## 2019-02-01 NOTE — Telephone Encounter (Signed)
Mail box is full could not leave a message

## 2019-02-01 NOTE — Telephone Encounter (Signed)
Pt called back, per Suanne Marker OK to read message from Threasa Alpha.

## 2019-02-03 ENCOUNTER — Other Ambulatory Visit: Payer: Self-pay

## 2019-02-03 ENCOUNTER — Other Ambulatory Visit
Admission: RE | Admit: 2019-02-03 | Discharge: 2019-02-03 | Disposition: A | Discharge status: Home / Self Care | Payer: BC Managed Care – PPO | Source: Ambulatory Visit | Attending: Gastroenterology | Admitting: Gastroenterology

## 2019-02-03 ENCOUNTER — Encounter
Admit: 2019-02-03 | Discharge: 2019-02-03 | Disposition: A | Discharge status: Home / Self Care | Payer: BLUE CROSS/BLUE SHIELD | Attending: Emergency Medicine

## 2019-02-03 DIAGNOSIS — R079 Chest pain, unspecified: Principal | ICD-10-CM

## 2019-02-03 DIAGNOSIS — Z01812 Encounter for preprocedural laboratory examination: Secondary | ICD-10-CM | POA: Insufficient documentation

## 2019-02-03 DIAGNOSIS — G4733 Obstructive sleep apnea (adult) (pediatric): Secondary | ICD-10-CM | POA: Diagnosis not present

## 2019-02-03 DIAGNOSIS — K219 Gastro-esophageal reflux disease without esophagitis: Secondary | ICD-10-CM | POA: Diagnosis not present

## 2019-02-03 DIAGNOSIS — Z7982 Long term (current) use of aspirin: Secondary | ICD-10-CM | POA: Diagnosis not present

## 2019-02-03 DIAGNOSIS — R0602 Shortness of breath: Secondary | ICD-10-CM | POA: Diagnosis not present

## 2019-02-03 DIAGNOSIS — I252 Old myocardial infarction: Secondary | ICD-10-CM | POA: Diagnosis not present

## 2019-02-03 DIAGNOSIS — I4891 Unspecified atrial fibrillation: Secondary | ICD-10-CM | POA: Diagnosis not present

## 2019-02-03 DIAGNOSIS — Z20828 Contact with and (suspected) exposure to other viral communicable diseases: Secondary | ICD-10-CM | POA: Insufficient documentation

## 2019-02-03 DIAGNOSIS — Z7951 Long term (current) use of inhaled steroids: Secondary | ICD-10-CM | POA: Diagnosis not present

## 2019-02-03 DIAGNOSIS — F419 Anxiety disorder, unspecified: Secondary | ICD-10-CM | POA: Diagnosis not present

## 2019-02-03 DIAGNOSIS — E785 Hyperlipidemia, unspecified: Secondary | ICD-10-CM | POA: Diagnosis not present

## 2019-02-03 DIAGNOSIS — M549 Dorsalgia, unspecified: Secondary | ICD-10-CM | POA: Diagnosis not present

## 2019-02-03 DIAGNOSIS — R0789 Other chest pain: Secondary | ICD-10-CM | POA: Diagnosis not present

## 2019-02-03 DIAGNOSIS — M25512 Pain in left shoulder: Secondary | ICD-10-CM | POA: Diagnosis not present

## 2019-02-03 DIAGNOSIS — J45909 Unspecified asthma, uncomplicated: Secondary | ICD-10-CM | POA: Diagnosis not present

## 2019-02-03 DIAGNOSIS — Z87891 Personal history of nicotine dependence: Secondary | ICD-10-CM | POA: Diagnosis not present

## 2019-02-03 DIAGNOSIS — Z79899 Other long term (current) drug therapy: Secondary | ICD-10-CM | POA: Diagnosis not present

## 2019-02-03 DIAGNOSIS — I10 Essential (primary) hypertension: Secondary | ICD-10-CM | POA: Diagnosis not present

## 2019-02-03 DIAGNOSIS — M25519 Pain in unspecified shoulder: Secondary | ICD-10-CM | POA: Diagnosis not present

## 2019-02-03 DIAGNOSIS — Z955 Presence of coronary angioplasty implant and graft: Secondary | ICD-10-CM | POA: Diagnosis not present

## 2019-02-03 DIAGNOSIS — I2510 Atherosclerotic heart disease of native coronary artery without angina pectoris: Secondary | ICD-10-CM | POA: Diagnosis not present

## 2019-02-04 LAB — SARS CORONAVIRUS 2 (TAT 6-24 HRS): SARS Coronavirus 2: NEGATIVE

## 2019-02-08 ENCOUNTER — Encounter (HOSPITAL_COMMUNITY): Payer: Self-pay | Admitting: *Deleted

## 2019-02-08 ENCOUNTER — Ambulatory Visit (HOSPITAL_COMMUNITY)
Admission: RE | Admit: 2019-02-08 | Discharge: 2019-02-08 | Disposition: A | Discharge status: Home / Self Care | Payer: BC Managed Care – PPO | Attending: Gastroenterology | Admitting: Gastroenterology

## 2019-02-08 ENCOUNTER — Encounter (HOSPITAL_COMMUNITY)
Admission: RE | Disposition: A | Discharge status: Home / Self Care | Payer: Self-pay | Source: Home / Self Care | Attending: Gastroenterology

## 2019-02-08 DIAGNOSIS — R1110 Vomiting, unspecified: Secondary | ICD-10-CM

## 2019-02-08 DIAGNOSIS — Z7982 Long term (current) use of aspirin: Secondary | ICD-10-CM | POA: Diagnosis not present

## 2019-02-08 DIAGNOSIS — K580 Irritable bowel syndrome with diarrhea: Secondary | ICD-10-CM | POA: Insufficient documentation

## 2019-02-08 DIAGNOSIS — R12 Heartburn: Secondary | ICD-10-CM | POA: Diagnosis not present

## 2019-02-08 DIAGNOSIS — D509 Iron deficiency anemia, unspecified: Secondary | ICD-10-CM | POA: Insufficient documentation

## 2019-02-08 DIAGNOSIS — R142 Eructation: Secondary | ICD-10-CM | POA: Insufficient documentation

## 2019-02-08 DIAGNOSIS — R0789 Other chest pain: Secondary | ICD-10-CM | POA: Diagnosis not present

## 2019-02-08 DIAGNOSIS — Z888 Allergy status to other drugs, medicaments and biological substances status: Secondary | ICD-10-CM | POA: Diagnosis not present

## 2019-02-08 DIAGNOSIS — Z79899 Other long term (current) drug therapy: Secondary | ICD-10-CM | POA: Diagnosis not present

## 2019-02-08 DIAGNOSIS — K219 Gastro-esophageal reflux disease without esophagitis: Secondary | ICD-10-CM

## 2019-02-08 HISTORY — PX: ESOPHAGEAL MANOMETRY: SHX5429

## 2019-02-08 HISTORY — PX: 24 HOUR PH STUDY: SHX5419

## 2019-02-08 SURGERY — MANOMETRY, ESOPHAGUS

## 2019-02-08 MED ORDER — LIDOCAINE VISCOUS HCL 2 % MT SOLN
OROMUCOSAL | Status: AC
  Filled 2019-02-08: qty 15

## 2019-02-08 SURGICAL SUPPLY — 2 items
FACESHIELD LNG OPTICON STERILE (SAFETY) IMPLANT
GLOVE BIO SURGEON STRL SZ8 (GLOVE) ×8 IMPLANT

## 2019-02-08 NOTE — Progress Notes (Addendum)
Esophageal manometry performed per protocol.  Patient tolerated well.  PH probe then place at 41 cm at right nare.  Patient tolerated well.  Verbalized understanding of all instructions and equipment.  Patient will return tomorrow, 02/09/2019 at 1130 am to have probe removed.  Report sent to Dr. Harl Bowie.

## 2019-02-09 ENCOUNTER — Other Ambulatory Visit: Payer: Self-pay

## 2019-02-09 ENCOUNTER — Emergency Department: Payer: BC Managed Care – PPO

## 2019-02-09 ENCOUNTER — Encounter: Payer: Self-pay | Admitting: Emergency Medicine

## 2019-02-09 DIAGNOSIS — I110 Hypertensive heart disease with heart failure: Secondary | ICD-10-CM | POA: Diagnosis not present

## 2019-02-09 DIAGNOSIS — J45909 Unspecified asthma, uncomplicated: Secondary | ICD-10-CM | POA: Insufficient documentation

## 2019-02-09 DIAGNOSIS — R42 Dizziness and giddiness: Secondary | ICD-10-CM | POA: Diagnosis not present

## 2019-02-09 DIAGNOSIS — M546 Pain in thoracic spine: Secondary | ICD-10-CM | POA: Diagnosis not present

## 2019-02-09 DIAGNOSIS — Z79899 Other long term (current) drug therapy: Secondary | ICD-10-CM | POA: Insufficient documentation

## 2019-02-09 DIAGNOSIS — R0602 Shortness of breath: Secondary | ICD-10-CM | POA: Insufficient documentation

## 2019-02-09 DIAGNOSIS — I2510 Atherosclerotic heart disease of native coronary artery without angina pectoris: Secondary | ICD-10-CM | POA: Insufficient documentation

## 2019-02-09 DIAGNOSIS — M25512 Pain in left shoulder: Secondary | ICD-10-CM | POA: Diagnosis not present

## 2019-02-09 DIAGNOSIS — R52 Pain, unspecified: Secondary | ICD-10-CM | POA: Diagnosis not present

## 2019-02-09 DIAGNOSIS — Z7982 Long term (current) use of aspirin: Secondary | ICD-10-CM | POA: Diagnosis not present

## 2019-02-09 DIAGNOSIS — R069 Unspecified abnormalities of breathing: Secondary | ICD-10-CM | POA: Diagnosis not present

## 2019-02-09 DIAGNOSIS — Z87891 Personal history of nicotine dependence: Secondary | ICD-10-CM | POA: Diagnosis not present

## 2019-02-09 DIAGNOSIS — I509 Heart failure, unspecified: Secondary | ICD-10-CM | POA: Insufficient documentation

## 2019-02-09 DIAGNOSIS — M5489 Other dorsalgia: Secondary | ICD-10-CM | POA: Diagnosis not present

## 2019-02-09 LAB — CBC
HCT: 41.8 % (ref 39.0–52.0)
Hemoglobin: 12.7 g/dL — ABNORMAL LOW (ref 13.0–17.0)
MCH: 22.5 pg — ABNORMAL LOW (ref 26.0–34.0)
MCHC: 30.4 g/dL (ref 30.0–36.0)
MCV: 74.1 fL — ABNORMAL LOW (ref 80.0–100.0)
Platelets: 376 10*3/uL (ref 150–400)
RBC: 5.64 MIL/uL (ref 4.22–5.81)
RDW: 16.6 % — ABNORMAL HIGH (ref 11.5–15.5)
WBC: 6.4 10*3/uL (ref 4.0–10.5)
nRBC: 0 % (ref 0.0–0.2)

## 2019-02-09 LAB — COMPREHENSIVE METABOLIC PANEL
ALT: 62 U/L — ABNORMAL HIGH (ref 0–44)
AST: 40 U/L (ref 15–41)
Albumin: 3.9 g/dL (ref 3.5–5.0)
Alkaline Phosphatase: 136 U/L — ABNORMAL HIGH (ref 38–126)
Anion gap: 10 (ref 5–15)
BUN: 21 mg/dL — ABNORMAL HIGH (ref 6–20)
CO2: 28 mmol/L (ref 22–32)
Calcium: 9.1 mg/dL (ref 8.9–10.3)
Chloride: 99 mmol/L (ref 98–111)
Creatinine, Ser: 1.16 mg/dL (ref 0.61–1.24)
GFR calc Af Amer: >60 mL/min (ref >60)
GFR calc non Af Amer: >60 mL/min (ref >60)
Glucose, Bld: 108 mg/dL — ABNORMAL HIGH (ref 70–99)
Potassium: 3.6 mmol/L (ref 3.5–5.1)
Sodium: 137 mmol/L (ref 135–145)
Total Bilirubin: 0.4 mg/dL (ref 0.3–1.2)
Total Protein: 8.7 g/dL — ABNORMAL HIGH (ref 6.5–8.1)

## 2019-02-09 LAB — TROPONIN I (HIGH SENSITIVITY): Troponin I (High Sensitivity): 3 ng/L (ref <18)

## 2019-02-09 MED ORDER — ALBUTEROL SULFATE (2.5 MG/3ML) 0.083% IN NEBU: 5.0000 mg | INHALATION_SOLUTION | Freq: Once | RESPIRATORY_TRACT | Status: DC

## 2019-02-09 NOTE — ED Triage Notes (Addendum)
Pt here with c/o shob while walking today, hx of stents placed a year ago, denies cp at this time. Asking this RN if anxiety can make him shob. Does c/o upper back and left shoulder pain, worsens with movement. Denies shob. NAD.

## 2019-02-10 ENCOUNTER — Encounter (HOSPITAL_COMMUNITY): Payer: Self-pay | Admitting: Gastroenterology

## 2019-02-10 ENCOUNTER — Emergency Department
Admission: EM | Admit: 2019-02-10 | Discharge: 2019-02-10 | Disposition: A | Discharge status: Home / Self Care | Payer: BC Managed Care – PPO | Attending: Emergency Medicine | Admitting: Emergency Medicine

## 2019-02-10 ENCOUNTER — Ambulatory Visit (INDEPENDENT_AMBULATORY_CARE_PROVIDER_SITE_OTHER): Payer: BC Managed Care – PPO | Admitting: Family Medicine

## 2019-02-10 VITALS — BP 137/91 | HR 68 | Ht 72.0 in | Wt 225.0 lb

## 2019-02-10 DIAGNOSIS — R52 Pain, unspecified: Secondary | ICD-10-CM

## 2019-02-10 DIAGNOSIS — M25512 Pain in left shoulder: Secondary | ICD-10-CM

## 2019-02-10 DIAGNOSIS — Z79899 Other long term (current) drug therapy: Secondary | ICD-10-CM

## 2019-02-10 DIAGNOSIS — R140 Abdominal distension (gaseous): Secondary | ICD-10-CM

## 2019-02-10 DIAGNOSIS — M25552 Pain in left hip: Secondary | ICD-10-CM | POA: Diagnosis not present

## 2019-02-10 DIAGNOSIS — G8929 Other chronic pain: Secondary | ICD-10-CM | POA: Diagnosis not present

## 2019-02-10 DIAGNOSIS — G4700 Insomnia, unspecified: Secondary | ICD-10-CM

## 2019-02-10 DIAGNOSIS — Z79891 Long term (current) use of opiate analgesic: Secondary | ICD-10-CM

## 2019-02-10 DIAGNOSIS — F329 Major depressive disorder, single episode, unspecified: Secondary | ICD-10-CM

## 2019-02-10 DIAGNOSIS — F419 Anxiety disorder, unspecified: Secondary | ICD-10-CM

## 2019-02-10 DIAGNOSIS — T07XXXA Unspecified multiple injuries, initial encounter: Secondary | ICD-10-CM

## 2019-02-10 MED ORDER — KETOROLAC TROMETHAMINE 30 MG/ML IJ SOLN
30.0000 mg | Freq: Once | INTRAMUSCULAR | Status: AC
  Administered 2019-02-10: 30 mg via INTRAMUSCULAR
  Filled 2019-02-10: qty 1

## 2019-02-10 MED ORDER — LIDOCAINE 5 % EX PTCH
1.0000 | MEDICATED_PATCH | CUTANEOUS | Status: DC
  Administered 2019-02-10: 01:00:00 1 via TRANSDERMAL
  Filled 2019-02-10: qty 1

## 2019-02-10 MED ORDER — SERTRALINE HCL 50 MG PO TABS: 50.0000 mg | ORAL_TABLET | Freq: Every day | ORAL | 1 refills | Status: DC

## 2019-02-10 MED ORDER — DICLOFENAC SODIUM 1 % TD GEL: 2.0000 g | Freq: Four times a day (QID) | TRANSDERMAL | 1 refills | Status: DC | PRN

## 2019-02-10 MED ORDER — HYDROCODONE-ACETAMINOPHEN 10-325 MG PO TABS: 0.5000 | ORAL_TABLET | Freq: Two times a day (BID) | ORAL | 0 refills | Status: DC | PRN

## 2019-02-10 MED ORDER — PREGABALIN 25 MG PO CAPS: 25.0000 mg | ORAL_CAPSULE | Freq: Two times a day (BID) | ORAL | 1 refills | Status: DC

## 2019-02-10 MED ORDER — ACETAMINOPHEN 500 MG PO TABS
1000.0000 mg | ORAL_TABLET | Freq: Once | ORAL | Status: AC
  Administered 2019-02-10: 01:00:00 1000 mg via ORAL
  Filled 2019-02-10: qty 2
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
## 2019-02-10 NOTE — Discharge Instructions (Signed)
Your EKG, chest x-ray and cardiac markers were reassuring.  This is most likely musculoskeletal chest pain.  You should take Tylenol 1 g every 8 hours and can take the naproxen once a day as well.  You should leave the lidocaine patch on for the next 24 hours.  You should return to the ER for worsening shortness of breath, cough or any other concerns.  You can also follow-up with your cardiologist as needed.

## 2019-02-10 NOTE — ED Notes (Signed)
Verbal from [place] not to collect 2nd trop as stated not needed.

## 2019-02-10 NOTE — Progress Notes (Signed)
Name: Dale Kennedy   MRN: [id number]    DOB: 1975/06/25   Date:02/10/2019       Progress Note  Subjective:    Chief Complaint  Chief Complaint  Patient presents with   Medication Refill    chronic pain due to injury    I connected with  [name]  on 02/10/19 at  3:40 PM EST by a video enabled telemedicine application and verified that I am speaking with the correct person using two identifiers.  I discussed the limitations of evaluation and management by telemedicine and the availability of in person appointments. The patient expressed understanding and agreed to proceed. Staff also discussed with the patient that there may be a patient responsible charge related to this service. Patient Location: home Provider Location: [facility] clinic Additional Individuals present: none  HPI   Anxiety: Patient complains of anxiety disorder, panic attacks and post traumatic stress  disorder.  He has the following symptoms: chest pain, dizziness, insomnia, palpitations, racing thoughts, shortness of breath, sweating. Marland Kitchen He denies current suicidal and homicidal ideation.  Patient's anxiety and his symptoms continue to be improving and better controlled since taking Zoloft and he has increased it from 25 mg to 50 feels the 50 mg is very effective would like to continue the same dose he denies any side effect at this time denies suicidal ideation.    Chronic pain -appointment today was to request refills on narcotic pain medicine He states that his past PCP, [name], had been managing his chronic pain multiple locations he states that he does have a controlled substance contract on file.  He states that they have used hydrocodone and oxycodone but Rochele Raring was too strong for him he is currently taking at least 1 to 2 pills a day of 10-325 Hydrocodone -he will try take 1/2 tablet when his pain is more minimal usually in the morning and he is able to stretch out 45 pills over 1 month.  He is having a lot of  abdominal discomfort and seeing GI so he has been avoiding NSAIDs.  When I asked him any other medications he is tried to manage pain with he responded that they have not tried other medication.  He also states that he has not been referred to pain management. In discussing limitations of controlled substances and narcotic pain medicine prescribing patient states that he is willing to try other medications he does want to do it gradually.    Abdominal discomfort - bloating, postprandial fullness no matter what he eats.  Seeing GI.  Not on stool softeners, taking narcotics every day. He does saw Dr. Silverio Decamp and had esophageal manometry done 2 days ago.  He also was in the ER about a week ago and then again today.  Upper endoscopy was done about 2 months ago -he was diagnosed with GERD and duodenitis, he has been compliant with Dexilant is also using Pepcid as needed. His esophageal manometry test was unremarkable and showed normal relaxation of the EG junction and no significant esophageal peristaltic abnormality.   Controlled substance database was reviewed today. Prior to giving refills for his narcotic pain medicine did discuss how his last refill was given on the 12th and he is 6 days early, but he states he has several pills left in does ask that it be postdated - he usually can stretch 45 pills out each month.  Patient Active Problem List   Diagnosis Date Noted   Thrombocytosis ([place]) 08/30/2018  Pulmonary hypertension ([place]) 12/16/2017   Vitamin B12 deficiency 12/16/2017   CAD (coronary artery disease) 12/12/2017   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 12/12/2017   Allergic rhinitis 08/08/2017   Obesity (BMI 30.0-34.9) 08/08/2017   Asthma    Elevated total protein 06/23/2016   Intermittent atrial fibrillation ([facility]) 05/12/2016   Elevated alkaline phosphatase level 04/01/2016   Prediabetes 11/15/2015   Microcytic anemia 11/15/2015   Hip pain, chronic  08/19/2015   Controlled substance agreement signed 08/19/2015   Chronic use of opiate for therapeutic purpose 08/19/2015   Chronic pain of multiple sites 05/03/2015   Elevated liver function tests 01/29/2015   Insomnia 11/28/2014   Hyperlipidemia, unspecified 11/26/2014   Left ureteral injury 10/24/2014   Weakness of both legs 10/01/2014   Multiple trauma 10/01/2014   Essential hypertension 10/01/2014    Social History   Tobacco Use   Smoking status: Former Smoker    Packs/day: 1.00    Types: Cigarettes    Quit date: 04/06/2008    Years since quitting: 10.8   Smokeless tobacco: Never Used  Substance Use Topics   Alcohol use: No    Alcohol/week: 0.0 standard drinks     Current Outpatient Medications:    acetaminophen (TYLENOL) 500 MG tablet, Take 1,000 mg by mouth every 6 (six) hours as needed for mild pain., Disp: , Rfl:    albuterol (VENTOLIN HFA) 108 (90 Base) MCG/ACT inhaler, Inhale 2 puffs into the lungs every 6 (six) hours as needed for wheezing or shortness of breath., Disp: 18 g, Rfl: 0   Alirocumab (PRALUENT) 150 MG/ML SOAJ, Inject into the skin., Disp: , Rfl:    aspirin 81 MG tablet, Take 81 mg by mouth daily., Disp: , Rfl:    chlorthalidone (HYGROTON) 25 MG tablet, , Disp: , Rfl:    dexlansoprazole (DEXILANT) 60 MG capsule, Take 1 capsule (60 mg total) by mouth daily., Disp: 90 capsule, Rfl: 3   famotidine (PEPCID) 20 MG tablet, Take 1 tablet (20 mg total) by mouth daily., Disp: 90 tablet, Rfl: 3   FLOVENT DISKUS 50 MCG/BLIST diskus inhaler, Inhale 1 puff into the lungs as needed. , Disp: , Rfl:    loratadine (CLARITIN) 10 MG tablet, TAKE 1 TABLET BY MOUTH ONCE DAILY AS NEEDED FOR ALLERGIES (Patient taking differently: Take 10 mg by mouth daily as needed for allergies. ), Disp: 30 tablet, Rfl: 11   metoprolol tartrate (LOPRESSOR) 25 MG tablet, Take by mouth., Disp: , Rfl:    nitroGLYCERIN (NITROSTAT) 0.4 MG SL tablet, Place 1 tablet under the  tongue as needed., Disp: , Rfl: 2   sertraline (ZOLOFT) 25 MG tablet, Take 1 tablet (25 mg total) by mouth daily., Disp: 90 tablet, Rfl: 1   sucralfate (CARAFATE) 1 GM/10ML suspension, Take 10 mLs (1 g total) by mouth 4 (four) times daily -  with meals and at bedtime., Disp: 1200 mL, Rfl: 2   co-enzyme Q-10 30 MG capsule, Take 30 mg by mouth daily., Disp: , Rfl:    ezetimibe (ZETIA) 10 MG tablet, TAKE 1 TABLET BY MOUTH EVERY MORNING FOR CHOLESTEROL (Patient not taking: Reported on 02/10/2019), Disp: 30 tablet, Rfl: 0   metoprolol succinate (TOPROL-XL) 25 MG 24 hr tablet, Take 25 mg by mouth daily. , Disp: , Rfl:    OxyCODONE HCl, Abuse Deter, (OXAYDO) 5 MG TABA, Take by mouth., Disp: , Rfl:    rosuvastatin (CRESTOR) 10 MG tablet, , Disp: , Rfl:   Allergies  Allergen Reactions  Ace Inhibitors Swelling    I personally reviewed active problem list, medication list, allergies, family history, social history, health maintenance, notes from last encounter, lab results, imaging with the patient/caregiver today.   Review of Systems  Constitutional: Negative.   HENT: Negative.   Eyes: Negative.   Respiratory: Negative.   Cardiovascular: Negative.   Gastrointestinal: Negative.   Endocrine: Negative.   Genitourinary: Negative.   Musculoskeletal: Negative.   Skin: Negative.   Allergic/Immunologic: Negative.   Neurological: Negative.   Hematological: Negative.   Psychiatric/Behavioral: Negative.   All other systems reviewed and are negative.    Objective:   Virtual encounter, vitals limited, only able to obtain the following Today's Vitals   02/10/19 1551  BP: (!) 137/91  Pulse: 68  SpO2: 99%  Weight: 225 lb (102.1 kg)  Height: 6' (1.829 m)   Body mass index is 30.52 kg/m. Nursing Note and Vital Signs reviewed.  Physical Exam Vitals signs and nursing note reviewed.  Constitutional:      General: He is not in acute distress.    Appearance: He is well-developed. He is  not ill-appearing, toxic-appearing or diaphoretic.  HENT:     Head: Normocephalic and atraumatic.     Nose: Nose normal.  Eyes:     General:        Right eye: No discharge.        Left eye: No discharge.     Conjunctiva/sclera: Conjunctivae normal.  Neck:     Trachea: No tracheal deviation.  Pulmonary:     Effort: Pulmonary effort is normal. No respiratory distress.     Breath sounds: No stridor.  Skin:    Coloration: Skin is not jaundiced or pale.     Findings: No rash.  Neurological:     Mental Status: He is alert.     Motor: No abnormal muscle tone.     Coordination: Coordination normal.  Psychiatric:        Behavior: Behavior normal.     PE limited by telephone encounter  Results for orders placed or performed during the hospital encounter of 02/10/19 (from the past 72 hour(s))  CBC     Status: Abnormal   Collection Time: 02/09/19 10:12 PM  Result Value Ref Range   WBC 6.4 4.0 - 10.5 K/uL   RBC 5.64 4.22 - 5.81 MIL/uL   Hemoglobin 12.7 (L) 13.0 - 17.0 g/dL   HCT 41.8 39.0 - 52.0 %   MCV 74.1 (L) 80.0 - 100.0 fL   MCH 22.5 (L) 26.0 - 34.0 pg   MCHC 30.4 30.0 - 36.0 g/dL   RDW 16.6 (H) 11.5 - 15.5 %   Platelets 376 150 - 400 K/uL   nRBC 0.0 0.0 - 0.2 %    Comment: Performed at [facility], [address] [place], [place] [address]  Comprehensive metabolic panel     Status: Abnormal   Collection Time: 02/09/19 10:12 PM  Result Value Ref Range   Sodium 137 135 - 145 mmol/L   Potassium 3.6 3.5 - 5.1 mmol/L   Chloride 99 98 - 111 mmol/L   CO2 28 22 - 32 mmol/L   Glucose, Bld 108 (H) 70 - 99 mg/dL   BUN 21 (H) 6 - 20 mg/dL   Creatinine, Ser 1.16 0.61 - 1.24 mg/dL   Calcium 9.1 8.9 - 10.3 mg/dL   Total Protein 8.7 (H) 6.5 - 8.1 g/dL   Albumin 3.9 3.5 - 5.0 g/dL   AST 40 15 -  41 U/L   ALT 62 (H) 0 - 44 U/L   Alkaline Phosphatase 136 (H) 38 - 126 U/L   Total Bilirubin 0.4 0.3 - 1.2 mg/dL   GFR calc non Af Amer >60 >60 mL/min   GFR calc Af Amer >60  >60 mL/min   Anion gap 10 5 - 15    Comment: Performed at [facility], [place], [place] [address]  Troponin I (High Sensitivity)     Status: None   Collection Time: 02/09/19 10:12 PM  Result Value Ref Range   Troponin I (High Sensitivity) 3 <18 ng/L    Comment: (NOTE) Elevated high sensitivity troponin I (hsTnI) values and significant  changes across serial measurements may suggest ACS but many other  chronic and acute conditions are known to elevate hsTnI results.  Refer to the "Links" section for chest pain algorithms and additional  guidance. Performed at [facility], [address], [place], [place] [address]     Assessment and Plan:     ICD-10-CM   1. Chronic pain of multiple sites  R52 HYDROcodone-acetaminophen (NORCO) 10-325 MG tablet   G89.29 pregabalin (LYRICA) 25 MG capsule    diclofenac sodium (VOLTAREN) 1 % GEL   Trial of Lyrica and Voltaren gel, would like to decrease narcotic use  2. Chronic left hip pain  M25.552 HYDROcodone-acetaminophen (NORCO) 10-325 MG tablet   G89.29 pregabalin (LYRICA) 25 MG capsule    diclofenac sodium (VOLTAREN) 1 % GEL  3. Controlled substance agreement signed  Z79.899 HYDROcodone-acetaminophen (NORCO) 10-325 MG tablet    pregabalin (LYRICA) 25 MG capsule   Signed formally with [name], I explained that he will need to come in again and sign a new contract also do urine drug screen  4. Chronic use of opiate for therapeutic purpose  Z79.891 HYDROcodone-acetaminophen (NORCO) 10-325 MG tablet    pregabalin (LYRICA) 25 MG capsule  5. Multiple trauma  T07.XXXA HYDROcodone-acetaminophen (NORCO) 10-325 MG tablet  6. Major depressive disorder with current active episode, unspecified depression episode severity, unspecified whether recurrent  F32.9 sertraline (ZOLOFT) 50 MG tablet   Well-controlled on Zoloft  7. Anxiety disorder, unspecified type  F41.9 sertraline (ZOLOFT) 50 MG tablet   Well-controlled with  Zoloft request refill on 50 mg  8. Insomnia, unspecified type  G47.00 sertraline (ZOLOFT) 50 MG tablet   Has well controlled with the addition of Zoloft  9. Postprandial abdominal bloating  R14.0    may be secondary to narcotic pain meds, discussed pharmacology and advised stool softeners, pt does want to try and decrease meds and see if abd improves    Discussed at length today benefits and risks of narcotic pain medication explained to him evidence that there is many other options that are very effective for controlling pain and have much less side effects than narcotics, usually pain control is better with other medications and there is much less risk.  We did discuss the risk of dependence, respiratory depression, abuse, side effects.  I explained to him that as someone is taking over his care from another provider of feel more comfortable if we could manage his pain with nonnarcotic medicine such as Cymbalta, Lyrica, topical NSAID gels, or have him consult with pain management.  He verbalized understanding and agreed to try other medicines   - I discussed the assessment and treatment plan with the patient. The patient was provided an opportunity to ask questions and all were answered. The patient agreed with the plan  and demonstrated an understanding of the instructions.  Return in about 1 month (around 03/12/2019) for med check for new meds and pain meds.   I provided 20 minutes of non-face-to-face time during this encounter.  Delsa Grana, PA-C 02/10/19 4:02 PM

## 2019-02-14 ENCOUNTER — Ambulatory Visit
Admission: RE | Admit: 2019-02-14 | Discharge: 2019-02-14 | Disposition: A | Discharge status: Home / Self Care | Payer: BC Managed Care – PPO | Source: Ambulatory Visit | Attending: Gastroenterology | Admitting: Gastroenterology

## 2019-02-14 ENCOUNTER — Other Ambulatory Visit: Payer: Self-pay

## 2019-02-14 ENCOUNTER — Encounter: Payer: Self-pay | Admitting: Family Medicine

## 2019-02-14 DIAGNOSIS — R12 Heartburn: Secondary | ICD-10-CM

## 2019-02-14 DIAGNOSIS — K219 Gastro-esophageal reflux disease without esophagitis: Secondary | ICD-10-CM | POA: Diagnosis not present

## 2019-02-14 DIAGNOSIS — R1110 Vomiting, unspecified: Secondary | ICD-10-CM

## 2019-02-14 DIAGNOSIS — R634 Abnormal weight loss: Secondary | ICD-10-CM | POA: Insufficient documentation

## 2019-02-14 DIAGNOSIS — R1011 Right upper quadrant pain: Secondary | ICD-10-CM | POA: Diagnosis not present

## 2019-02-14 MED ORDER — TECHNETIUM TC 99M MEBROFENIN IV KIT
5.0800 | PACK | Freq: Once | INTRAVENOUS | Status: AC | PRN
  Administered 2019-02-14: 5.08 via INTRAVENOUS

## 2019-02-16 ENCOUNTER — Encounter: Payer: Self-pay | Admitting: Family Medicine

## 2019-02-16 ENCOUNTER — Encounter: Payer: Self-pay | Admitting: Gastroenterology

## 2019-02-16 NOTE — Progress Notes (Signed)
Normal study

## 2019-02-17 ENCOUNTER — Telehealth: Payer: Self-pay

## 2019-02-17 NOTE — Telephone Encounter (Signed)
-----   Message from [name], MD sent at 02/16/2019  2:49 PM EST ----- Normal study

## 2019-02-18 DIAGNOSIS — M546 Pain in thoracic spine: Principal | ICD-10-CM

## 2019-02-18 DIAGNOSIS — R0981 Nasal congestion: Principal | ICD-10-CM

## 2019-02-18 DIAGNOSIS — R0789 Other chest pain: Principal | ICD-10-CM

## 2019-02-18 DIAGNOSIS — R1013 Epigastric pain: Principal | ICD-10-CM

## 2019-02-18 DIAGNOSIS — I2510 Atherosclerotic heart disease of native coronary artery without angina pectoris: Secondary | ICD-10-CM | POA: Diagnosis not present

## 2019-02-18 DIAGNOSIS — R079 Chest pain, unspecified: Secondary | ICD-10-CM | POA: Diagnosis not present

## 2019-02-18 DIAGNOSIS — J45909 Unspecified asthma, uncomplicated: Secondary | ICD-10-CM | POA: Diagnosis not present

## 2019-02-18 DIAGNOSIS — F419 Anxiety disorder, unspecified: Secondary | ICD-10-CM | POA: Diagnosis not present

## 2019-02-18 DIAGNOSIS — G4733 Obstructive sleep apnea (adult) (pediatric): Secondary | ICD-10-CM | POA: Diagnosis not present

## 2019-02-18 DIAGNOSIS — E785 Hyperlipidemia, unspecified: Secondary | ICD-10-CM | POA: Diagnosis not present

## 2019-02-18 DIAGNOSIS — M79661 Pain in right lower leg: Secondary | ICD-10-CM | POA: Diagnosis not present

## 2019-02-18 DIAGNOSIS — Z7951 Long term (current) use of inhaled steroids: Secondary | ICD-10-CM | POA: Diagnosis not present

## 2019-02-18 DIAGNOSIS — R10816 Epigastric abdominal tenderness: Secondary | ICD-10-CM | POA: Diagnosis not present

## 2019-02-18 DIAGNOSIS — K219 Gastro-esophageal reflux disease without esophagitis: Secondary | ICD-10-CM | POA: Diagnosis not present

## 2019-02-18 DIAGNOSIS — I10 Essential (primary) hypertension: Secondary | ICD-10-CM | POA: Diagnosis not present

## 2019-02-18 DIAGNOSIS — I4891 Unspecified atrial fibrillation: Secondary | ICD-10-CM | POA: Diagnosis not present

## 2019-02-18 DIAGNOSIS — Z7982 Long term (current) use of aspirin: Secondary | ICD-10-CM | POA: Diagnosis not present

## 2019-02-18 MED ORDER — CYCLOBENZAPRINE 10 MG TABLET
ORAL_TABLET | Freq: Three times a day (TID) | ORAL | 0 refills | 10 days | Status: CP | PRN
Start: 2019-02-18 — End: 2019-02-28

## 2019-02-19 ENCOUNTER — Encounter
Admit: 2019-02-19 | Discharge: 2019-02-19 | Disposition: A | Discharge status: Home / Self Care | Payer: BLUE CROSS/BLUE SHIELD

## 2019-02-24 ENCOUNTER — Encounter: Admit: 2019-02-24 | Discharge: 2019-02-25 | Payer: BLUE CROSS/BLUE SHIELD

## 2019-02-24 DIAGNOSIS — I2510 Atherosclerotic heart disease of native coronary artery without angina pectoris: Principal | ICD-10-CM

## 2019-02-24 DIAGNOSIS — F419 Anxiety disorder, unspecified: Principal | ICD-10-CM

## 2019-02-24 DIAGNOSIS — E785 Hyperlipidemia, unspecified: Principal | ICD-10-CM

## 2019-02-24 MED ORDER — EZETIMIBE 10 MG TABLET
ORAL_TABLET | Freq: Every day | ORAL | 3 refills | 90 days | Status: CP
Start: 2019-02-24 — End: 2020-02-24

## 2019-02-24 MED ORDER — ATORVASTATIN 10 MG TABLET
ORAL_TABLET | Freq: Every day | ORAL | 6 refills | 30 days | Status: CP
Start: 2019-02-24 — End: 2019-03-26

## 2019-02-27 ENCOUNTER — Other Ambulatory Visit: Payer: Self-pay

## 2019-02-27 DIAGNOSIS — R0789 Other chest pain: Secondary | ICD-10-CM

## 2019-03-06 ENCOUNTER — Telehealth: Payer: Self-pay | Admitting: Internal Medicine

## 2019-03-06 ENCOUNTER — Other Ambulatory Visit: Payer: Self-pay

## 2019-03-06 NOTE — Telephone Encounter (Signed)
Pt is aware of date/time of covid test.   

## 2019-03-08 ENCOUNTER — Other Ambulatory Visit: Payer: Self-pay

## 2019-03-08 ENCOUNTER — Other Ambulatory Visit
Admission: RE | Admit: 2019-03-08 | Discharge: 2019-03-08 | Disposition: A | Discharge status: Home / Self Care | Payer: BC Managed Care – PPO | Source: Ambulatory Visit | Attending: Internal Medicine | Admitting: Internal Medicine

## 2019-03-08 DIAGNOSIS — U071 COVID-19: Secondary | ICD-10-CM | POA: Insufficient documentation

## 2019-03-08 DIAGNOSIS — Z01812 Encounter for preprocedural laboratory examination: Secondary | ICD-10-CM | POA: Diagnosis not present

## 2019-03-09 ENCOUNTER — Ambulatory Visit: Payer: BC Managed Care – PPO

## 2019-03-09 LAB — SARS CORONAVIRUS 2 (TAT 6-24 HRS): SARS Coronavirus 2: POSITIVE — AB

## 2019-03-09 NOTE — Telephone Encounter (Signed)
Pt is positive for covid. Received verbal from DK to make pt aware.  Pt is aware of results and voiced his understanding. Advised pt to quarantine.  Contacted [place] health department and reported case to [place].  Suanne Marker, please reschedule PFT.

## 2019-03-09 NOTE — Telephone Encounter (Signed)
PFT for 03/09/2019 canceled with Pamala Hurry.  New PFT scheduled for Thurs. May 25, 2019 at 12:00 arrival time at [facility] with COVID test on Wed 06/03/2019 at [place] between hours of 8:00 am - 11:00 am. Dale Kennedy Pt called and is aware of both appointments. Nothing else needed at this time. Pt is aware that appointment with DK has been canceled and that he will receive a recall letter in Jan to schedule F/U appointment with DK in Feb. 2021. Nothing else needed at this time. [name]

## 2019-03-12 ENCOUNTER — Encounter: Admit: 2019-03-12 | Disposition: A | Discharge status: Home / Self Care | Payer: BLUE CROSS/BLUE SHIELD

## 2019-03-15 DIAGNOSIS — G4733 Obstructive sleep apnea (adult) (pediatric): Secondary | ICD-10-CM | POA: Diagnosis not present

## 2019-03-16 ENCOUNTER — Ambulatory Visit: Payer: BC Managed Care – PPO | Admitting: Internal Medicine

## 2019-03-22 ENCOUNTER — Encounter: Payer: Self-pay | Admitting: Family Medicine

## 2019-03-23 ENCOUNTER — Ambulatory Visit (INDEPENDENT_AMBULATORY_CARE_PROVIDER_SITE_OTHER): Payer: BC Managed Care – PPO | Admitting: Family Medicine

## 2019-03-23 ENCOUNTER — Other Ambulatory Visit: Payer: Self-pay

## 2019-03-23 ENCOUNTER — Encounter: Payer: Self-pay | Admitting: Family Medicine

## 2019-03-23 VITALS — BP 146/92 | HR 69 | Temp 97.0°F | Ht 74.0 in | Wt 225.0 lb

## 2019-03-23 DIAGNOSIS — R52 Pain, unspecified: Secondary | ICD-10-CM

## 2019-03-23 DIAGNOSIS — Z79899 Other long term (current) drug therapy: Secondary | ICD-10-CM | POA: Diagnosis not present

## 2019-03-23 DIAGNOSIS — E782 Mixed hyperlipidemia: Secondary | ICD-10-CM

## 2019-03-23 DIAGNOSIS — G8929 Other chronic pain: Secondary | ICD-10-CM

## 2019-03-23 DIAGNOSIS — M791 Myalgia, unspecified site: Secondary | ICD-10-CM

## 2019-03-23 DIAGNOSIS — Z79891 Long term (current) use of opiate analgesic: Secondary | ICD-10-CM | POA: Diagnosis not present

## 2019-03-23 DIAGNOSIS — T07XXXA Unspecified multiple injuries, initial encounter: Secondary | ICD-10-CM | POA: Diagnosis not present

## 2019-03-23 MED ORDER — PREGABALIN 25 MG PO CAPS: ORAL_CAPSULE | ORAL | 0 refills | Status: DC

## 2019-03-23 MED ORDER — HYDROCODONE-ACETAMINOPHEN 10-325 MG PO TABS: 0.5000 | ORAL_TABLET | Freq: Two times a day (BID) | ORAL | 0 refills | Status: DC | PRN

## 2019-03-23 NOTE — Progress Notes (Signed)
Name: Dale Kennedy   MRN: [id number]    DOB: 07/14/1975   Date:03/23/2019       Progress Note  Subjective:    Chief Complaint  Chief Complaint  Patient presents with  . Pain    I connected with  LESLEE LEHR  on 03/23/19 at 11:40 AM EST by a video enabled telemedicine application and verified that I am speaking with the correct person using two identifiers.  I discussed the limitations of evaluation and management by telemedicine and the availability of in person appointments. The patient expressed understanding and agreed to proceed. Staff also discussed with the patient that there may be a patient responsible charge related to this service. Patient Location: home Provider Location: [facility] Additional Individuals present: none  HPI  Pt presents for + COVID, he did screening COVID test prior to PFT 03/08/2019. Pt did loose sense of taste and smell about two weeks before the COVID test.  He has also had worsening muscle pain in his shoulders back some epigastric pain chest pain and nasal congestion.  He has a lot of pain symptoms frequently also chest pain and epigastric pain frequently.  Nasal congestion and loss of smell and taste were side effects of a new medication that he checked.  He did present to the ER on 02/18/2019 for his symptoms and cardiac work-up was done but there was no Covid test done at that time.  He had a virtual appointment on 02/24/2019 cardiology appt - they had previously taken pt off statin due to suspected myalgias se, he was put on a shot which has SE of nasal congestion, loss of tsate and smell, on 11/20 shot was discontinued and he was put back on zetia and lipitor 10 mg   Pt complains of worse muscle pain over the past two weeks, we had planned to decrease his narcotic use and try adding other meds to help with pain.  He states he was unable to decrease the narcotic use while having more severe pain but over the past 2 weeks it has gradually improved he last  filled his hydrocodone prescription over a month ago and he has enough to last him the next 2 weeks.  He has been using Lyrica only 25 mg dose at bedtime and it does help him sleep and helps with his pain at night he did not start the morning dose, but is willing to try.  He has been working on his diet and exercise and additionally request that we check his labs for his cholesterol since it has not been done since September.  Regarding Covid he states that he does not have any fever sweats chills, his intermittent chest pain, epigastric pain, respiratory complaints are all back to their baseline and his nasal congestion and loss of smell and taste have resolved as well.  Covid test was 15 days ago, he is clear from isolation based on CDC criteria but unfortunately cannot come into our clinic for another 2 weeks to do labs or get his PFTs done I have explained to him to come in and do labs after January 2.   Patient Active Problem List   Diagnosis Date Noted  . Heartburn   . Regurgitation of food   . Gastroesophageal reflux disease   . Thrombocytosis ([place]) 08/30/2018  . Pulmonary hypertension ([place]) 12/16/2017  . Vitamin B12 deficiency 12/16/2017  . CAD (coronary artery disease) 12/12/2017  . 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  12/12/2017  . Allergic rhinitis 08/08/2017  . Obesity (BMI 30.0-34.9) 08/08/2017  . Asthma   . 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 Intermittent atrial fibrillation ([place]) 05/12/2016  . Elevated alkaline phosphatase level 04/01/2016  . Prediabetes 11/15/2015  . Microcytic anemia 11/15/2015  . Hip pain, chronic 08/19/2015  . Controlled substance agreement signed 08/19/2015  . Chronic use of opiate for therapeutic purpose 08/19/2015  . Chronic pain of multiple sites 05/03/2015  . Elevated liver function tests 01/29/2015  . Insomnia 11/28/2014  . Hyperlipidemia, unspecified 11/26/2014  . Left ureteral injury 10/24/2014  . Weakness of both legs  10/01/2014  . Multiple trauma 10/01/2014  . Essential hypertension 10/01/2014    Social History   Tobacco Use  . Smoking status: Former Smoker    Packs/day: 1.00    Types: Cigarettes    Quit date: 04/06/2008    Years since quitting: 10.9  . Smokeless tobacco: Never Used  Substance Use Topics  . Alcohol use: No    Alcohol/week: 0.0 standard drinks     Current Outpatient Medications:  .  acetaminophen (TYLENOL) 500 MG tablet, Take 1,000 mg by mouth every 6 (six) hours as needed for mild pain., Disp: , Rfl:  .  albuterol (VENTOLIN HFA) 108 (90 Base) MCG/ACT inhaler, Inhale 2 puffs into the lungs every 6 (six) hours as needed for wheezing or shortness of breath., Disp: 18 g, Rfl: 0 .  Alirocumab (PRALUENT) 150 MG/ML SOAJ, Inject into the skin., Disp: , Rfl:  .  aspirin 81 MG tablet, Take 81 mg by mouth daily., Disp: , Rfl:  .  chlorthalidone (HYGROTON) 25 MG tablet, , Disp: , Rfl:  .  co-enzyme Q-10 30 MG capsule, Take 30 mg by mouth daily., Disp: , Rfl:  .  dexlansoprazole (DEXILANT) 60 MG capsule, Take 1 capsule (60 mg total) by mouth daily., Disp: 90 capsule, Rfl: 3 .  diclofenac sodium (VOLTAREN) 1 % GEL, Apply 2 g topically 4 (four) times daily as needed (for chronic back and joint pain)., Disp: 100 g, Rfl: 1 .  ezetimibe (ZETIA) 10 MG tablet, TAKE 1 TABLET BY MOUTH EVERY MORNING FOR CHOLESTEROL, Disp: 30 tablet, Rfl: 0 .  famotidine (PEPCID) 20 MG tablet, Take 1 tablet (20 mg total) by mouth daily., Disp: 90 tablet, Rfl: 3 .  FLOVENT DISKUS 50 MCG/BLIST diskus inhaler, Inhale 1 puff into the lungs as needed. , Disp: , Rfl:  .  loratadine (CLARITIN) 10 MG tablet, TAKE 1 TABLET BY MOUTH ONCE DAILY AS NEEDED FOR ALLERGIES (Patient taking differently: Take 10 mg by mouth daily as needed for allergies. ), Disp: 30 tablet, Rfl: 11 .  metoprolol succinate (TOPROL-XL) 25 MG 24 hr tablet, Take 25 mg by mouth daily. , Disp: , Rfl:  .  metoprolol tartrate (LOPRESSOR) 25 MG tablet, Take 12.5 mg  by mouth. , Disp: , Rfl:  .  nitroGLYCERIN (NITROSTAT) 0.4 MG SL tablet, Place 1 tablet under the tongue as needed., Disp: , Rfl: 2 .  OxyCODONE HCl, Abuse Deter, (OXAYDO) 5 MG TABA, Take by mouth., Disp: , Rfl:  .  pregabalin (LYRICA) 25 MG capsule, Take 1 capsule (25 mg total) by mouth 2 (two) times daily., Disp: 60 capsule, Rfl: 1 .  rosuvastatin (CRESTOR) 10 MG tablet, , Disp: , Rfl:  .  sertraline (ZOLOFT) 50 MG tablet, Take 1 tablet (50 mg total) by mouth at bedtime., Disp: 90 tablet, Rfl: 1 .  sucralfate (CARAFATE) 1 GM/10ML suspension, Take 10 mLs (1 g total)  by mouth 4 (four) times daily -  with meals and at bedtime., Disp: 1200 mL, Rfl: 2  Allergies  Allergen Reactions  . Ace Inhibitors Swelling    I personally reviewed active problem list, medication list, allergies, family history, social history, health maintenance, notes from last several encounters, lab results, imaging with the patient/caregiver today.  Review of Systems  Constitutional: Negative.   HENT: Negative.   Eyes: Negative.   Respiratory: Negative.   Cardiovascular: Negative.   Gastrointestinal: Negative.   Endocrine: Negative.   Genitourinary: Negative.   Musculoskeletal: Negative.   Skin: Negative.   Allergic/Immunologic: Negative.   Neurological: Negative.   Hematological: Negative.   Psychiatric/Behavioral: Negative.   All other systems reviewed and are negative.    Objective:   Virtual encounter, vitals limited, only able to obtain the following Today's Vitals   03/23/19 1114  BP: (!) 146/92  Pulse: 69  Temp: (!) 97 F (36.1 C)  Weight: 225 lb (102.1 kg)  Height: 6\' 2"  (1.88 m)   Body mass index is 28.89 kg/m. Nursing Note and Vital Signs reviewed.  Physical Exam Vitals and nursing note reviewed.  Constitutional:      General: He is not in acute distress.    Appearance: Normal appearance. He is well-developed. He is not ill-appearing, toxic-appearing or diaphoretic.     Comments:  Patient well-appearing  HENT:     Head: Normocephalic and atraumatic.     Nose: Nose normal.  Eyes:     General:        Right eye: No discharge.        Left eye: No discharge.     Conjunctiva/sclera: Conjunctivae normal.  Neck:     Trachea: No tracheal deviation.     Comments: Normal phonation Pulmonary:     Effort: Pulmonary effort is normal. No respiratory distress.     Breath sounds: No stridor.     Comments: No respiratory distress, tachypnea, retractions, accessory muscle use, audible wheeze or stridor Skin:    Coloration: Skin is not jaundiced or pale.     Findings: No rash.  Neurological:     Mental Status: He is alert.     Motor: No abnormal muscle tone.     Coordination: Coordination normal.  Psychiatric:        Mood and Affect: Mood normal.        Behavior: Behavior normal.     PE limited by telephone encounter  No results found for this or any previous visit (from the past 72 hour(s)).  Assessment and Plan:     ICD-10-CM   1. Chronic pain of multiple sites  R52 HYDROcodone-acetaminophen (NORCO) 10-325 MG tablet   G89.29 pregabalin (LYRICA) 25 MG capsule   pt has been able to successfully start to lower narcotic use with lyrica even with recent COVID and some worse pain sx last month  2. Controlled substance agreement signed  Z79.899 HYDROcodone-acetaminophen (NORCO) 10-325 MG tablet    pregabalin (LYRICA) 25 MG capsule   Signed formally with [name], I explained that he will need to come in again and sign a new contract also do urine drug screen  3. Chronic use of opiate for therapeutic purpose  Z79.891 HYDROcodone-acetaminophen (NORCO) 10-325 MG tablet    pregabalin (LYRICA) 25 MG capsule  4. Multiple trauma  T07.XXXA HYDROcodone-acetaminophen (NORCO) 10-325 MG tablet    pregabalin (LYRICA) 25 MG capsule  5. Mixed hyperlipidemia  E78.2 CMP w GFR    Lipid Panel  recheck labs - management changes by cardiology, no labs since sept  6. Myalgia  M79.10 CMP w  GFR    Lipid Panel    CK (Creatine Kinase)   multiple changes in statin mgmt and severe pain to back, muscles, shoulders, legs, waxing and waning - check CK since back on statin     -Red flags and when to present for emergency care or RTC including fever >101.54F, chest pain, shortness of breath, new/worsening/un-resolving symptoms, reviewed with patient at time of visit. Follow up and care instructions discussed and provided in AVS. - I discussed the assessment and treatment plan with the patient. The patient was provided an opportunity to ask questions and all were answered. The patient agreed with the plan and demonstrated an understanding of the instructions.  I provided 12 minutes of non-face-to-face time during this encounter.  Delsa Grana, PA-C 03/23/19 11:51 AM

## 2019-03-24 DIAGNOSIS — R457 State of emotional shock and stress, unspecified: Secondary | ICD-10-CM | POA: Diagnosis not present

## 2019-03-24 DIAGNOSIS — Z209 Contact with and (suspected) exposure to unspecified communicable disease: Secondary | ICD-10-CM | POA: Diagnosis not present

## 2019-03-24 MED ORDER — METOPROLOL TARTRATE 25 MG TABLET
ORAL_TABLET | Freq: Two times a day (BID) | ORAL | 6 refills | 30.00000 days | Status: CP
Start: 2019-03-24 — End: 2019-04-23

## 2019-04-04 ENCOUNTER — Encounter: Payer: Self-pay | Admitting: Family Medicine

## 2019-04-10 ENCOUNTER — Telehealth: Payer: Self-pay

## 2019-04-10 ENCOUNTER — Ambulatory Visit: Payer: BC Managed Care – PPO | Admitting: Gastroenterology

## 2019-04-10 NOTE — Telephone Encounter (Signed)
Called pt to inquire about his second opinion visit with Dr. Dannielle Huh at [facility]. Pt states they contacted him to schedule but he declined because his symptoms resolved after completing the course of Xifaxan and taking the Dexilant has also helped. Pt has canceled today's visit with Dr. Vicente Males and plans to contact our office if symptoms return.

## 2019-04-19 ENCOUNTER — Encounter: Payer: Self-pay | Admitting: Family Medicine

## 2019-04-19 DIAGNOSIS — G8929 Other chronic pain: Secondary | ICD-10-CM

## 2019-04-19 DIAGNOSIS — T07XXXA Unspecified multiple injuries, initial encounter: Secondary | ICD-10-CM

## 2019-04-19 DIAGNOSIS — Z79891 Long term (current) use of opiate analgesic: Secondary | ICD-10-CM

## 2019-04-19 DIAGNOSIS — Z79899 Other long term (current) drug therapy: Secondary | ICD-10-CM

## 2019-04-20 MED ORDER — HYDROCODONE-ACETAMINOPHEN 10-325 MG PO TABS: 0.5000 | ORAL_TABLET | Freq: Two times a day (BID) | ORAL | 0 refills | Status: DC | PRN

## 2019-04-26 ENCOUNTER — Encounter
Admit: 2019-04-26 | Discharge: 2019-04-27 | Disposition: A | Discharge status: Home / Self Care | Payer: BLUE CROSS/BLUE SHIELD | Attending: Family

## 2019-04-26 DIAGNOSIS — R079 Chest pain, unspecified: Principal | ICD-10-CM

## 2019-04-26 DIAGNOSIS — I10 Essential (primary) hypertension: Secondary | ICD-10-CM | POA: Diagnosis not present

## 2019-04-26 DIAGNOSIS — Z7951 Long term (current) use of inhaled steroids: Secondary | ICD-10-CM | POA: Diagnosis not present

## 2019-04-26 DIAGNOSIS — I2510 Atherosclerotic heart disease of native coronary artery without angina pectoris: Secondary | ICD-10-CM | POA: Diagnosis not present

## 2019-04-26 DIAGNOSIS — Z955 Presence of coronary angioplasty implant and graft: Secondary | ICD-10-CM | POA: Diagnosis not present

## 2019-04-26 DIAGNOSIS — R0789 Other chest pain: Secondary | ICD-10-CM | POA: Diagnosis not present

## 2019-04-26 DIAGNOSIS — Z7982 Long term (current) use of aspirin: Secondary | ICD-10-CM | POA: Diagnosis not present

## 2019-04-26 DIAGNOSIS — Z8249 Family history of ischemic heart disease and other diseases of the circulatory system: Secondary | ICD-10-CM | POA: Diagnosis not present

## 2019-04-26 DIAGNOSIS — F419 Anxiety disorder, unspecified: Secondary | ICD-10-CM | POA: Diagnosis not present

## 2019-04-26 DIAGNOSIS — Z87891 Personal history of nicotine dependence: Secondary | ICD-10-CM | POA: Diagnosis not present

## 2019-04-26 DIAGNOSIS — R0602 Shortness of breath: Secondary | ICD-10-CM | POA: Diagnosis not present

## 2019-04-26 DIAGNOSIS — I252 Old myocardial infarction: Secondary | ICD-10-CM | POA: Diagnosis not present

## 2019-04-26 DIAGNOSIS — E119 Type 2 diabetes mellitus without complications: Secondary | ICD-10-CM | POA: Diagnosis not present

## 2019-04-26 DIAGNOSIS — Z79899 Other long term (current) drug therapy: Secondary | ICD-10-CM | POA: Diagnosis not present

## 2019-04-26 DIAGNOSIS — J45909 Unspecified asthma, uncomplicated: Secondary | ICD-10-CM | POA: Diagnosis not present

## 2019-04-26 DIAGNOSIS — E785 Hyperlipidemia, unspecified: Secondary | ICD-10-CM | POA: Diagnosis not present

## 2019-04-26 DIAGNOSIS — I4891 Unspecified atrial fibrillation: Secondary | ICD-10-CM | POA: Diagnosis not present

## 2019-04-27 DIAGNOSIS — I2510 Atherosclerotic heart disease of native coronary artery without angina pectoris: Principal | ICD-10-CM

## 2019-04-27 DIAGNOSIS — E785 Hyperlipidemia, unspecified: Principal | ICD-10-CM

## 2019-04-27 MED ORDER — REPATHA SURECLICK 140 MG/ML SUBCUTANEOUS PEN INJECTOR
INJECTION | SUBCUTANEOUS | 5 refills | 0.00000 days | Status: CP
Start: 2019-04-27 — End: ?

## 2019-05-01 DIAGNOSIS — E785 Hyperlipidemia, unspecified: Secondary | ICD-10-CM | POA: Diagnosis not present

## 2019-05-01 DIAGNOSIS — Z7951 Long term (current) use of inhaled steroids: Secondary | ICD-10-CM | POA: Diagnosis not present

## 2019-05-01 DIAGNOSIS — I498 Other specified cardiac arrhythmias: Secondary | ICD-10-CM | POA: Diagnosis not present

## 2019-05-01 DIAGNOSIS — R002 Palpitations: Secondary | ICD-10-CM | POA: Diagnosis not present

## 2019-05-01 DIAGNOSIS — Z79899 Other long term (current) drug therapy: Secondary | ICD-10-CM | POA: Diagnosis not present

## 2019-05-01 DIAGNOSIS — Z87891 Personal history of nicotine dependence: Secondary | ICD-10-CM | POA: Diagnosis not present

## 2019-05-01 DIAGNOSIS — F419 Anxiety disorder, unspecified: Secondary | ICD-10-CM | POA: Diagnosis not present

## 2019-05-01 DIAGNOSIS — I2510 Atherosclerotic heart disease of native coronary artery without angina pectoris: Secondary | ICD-10-CM | POA: Diagnosis not present

## 2019-05-01 DIAGNOSIS — R0602 Shortness of breath: Secondary | ICD-10-CM | POA: Diagnosis not present

## 2019-05-01 DIAGNOSIS — I4891 Unspecified atrial fibrillation: Secondary | ICD-10-CM | POA: Diagnosis not present

## 2019-05-01 DIAGNOSIS — R079 Chest pain, unspecified: Secondary | ICD-10-CM | POA: Diagnosis not present

## 2019-05-01 DIAGNOSIS — Z791 Long term (current) use of non-steroidal anti-inflammatories (NSAID): Secondary | ICD-10-CM | POA: Diagnosis not present

## 2019-05-01 DIAGNOSIS — K219 Gastro-esophageal reflux disease without esophagitis: Secondary | ICD-10-CM | POA: Diagnosis not present

## 2019-05-01 DIAGNOSIS — R918 Other nonspecific abnormal finding of lung field: Secondary | ICD-10-CM | POA: Diagnosis not present

## 2019-05-01 DIAGNOSIS — I10 Essential (primary) hypertension: Secondary | ICD-10-CM | POA: Diagnosis not present

## 2019-05-01 DIAGNOSIS — I252 Old myocardial infarction: Secondary | ICD-10-CM | POA: Diagnosis not present

## 2019-05-01 DIAGNOSIS — J45909 Unspecified asthma, uncomplicated: Secondary | ICD-10-CM | POA: Diagnosis not present

## 2019-05-01 DIAGNOSIS — Z7982 Long term (current) use of aspirin: Secondary | ICD-10-CM | POA: Diagnosis not present

## 2019-05-02 ENCOUNTER — Encounter
Admit: 2019-05-02 | Discharge: 2019-05-02 | Disposition: A | Discharge status: Home / Self Care | Payer: BLUE CROSS/BLUE SHIELD | Attending: Emergency Medicine

## 2019-05-04 ENCOUNTER — Encounter
Admit: 2019-05-04 | Discharge: 2019-05-05 | Payer: BLUE CROSS/BLUE SHIELD | Attending: Adult Health | Primary: Adult Health

## 2019-05-04 DIAGNOSIS — I2510 Atherosclerotic heart disease of native coronary artery without angina pectoris: Principal | ICD-10-CM

## 2019-05-04 DIAGNOSIS — Z789 Other specified health status: Principal | ICD-10-CM

## 2019-05-04 DIAGNOSIS — E785 Hyperlipidemia, unspecified: Secondary | ICD-10-CM | POA: Diagnosis not present

## 2019-05-04 DIAGNOSIS — Z8616 Personal history of COVID-19: Secondary | ICD-10-CM | POA: Diagnosis not present

## 2019-05-04 DIAGNOSIS — Z23 Encounter for immunization: Secondary | ICD-10-CM | POA: Diagnosis not present

## 2019-05-04 DIAGNOSIS — U071 COVID-19: Secondary | ICD-10-CM | POA: Insufficient documentation

## 2019-05-04 DIAGNOSIS — R0602 Shortness of breath: Secondary | ICD-10-CM | POA: Diagnosis not present

## 2019-05-04 MED ORDER — PRALUENT PEN 150 MG/ML SUBCUTANEOUS PEN INJECTOR
INJECTION | SUBCUTANEOUS | 5 refills | 28 days | Status: CP
Start: 2019-05-04 — End: ?

## 2019-05-05 DIAGNOSIS — I2510 Atherosclerotic heart disease of native coronary artery without angina pectoris: Principal | ICD-10-CM

## 2019-05-10 DIAGNOSIS — E785 Hyperlipidemia, unspecified: Principal | ICD-10-CM

## 2019-05-10 DIAGNOSIS — I2510 Atherosclerotic heart disease of native coronary artery without angina pectoris: Principal | ICD-10-CM

## 2019-05-10 MED ORDER — REPATHA SURECLICK 140 MG/ML SUBCUTANEOUS PEN INJECTOR
INJECTION | SUBCUTANEOUS | 5 refills | 0.00000 days | Status: CP
Start: 2019-05-10 — End: ?
  Filled 2019-05-16: qty 2, 28d supply, fill #0

## 2019-05-11 DIAGNOSIS — E785 Hyperlipidemia, unspecified: Principal | ICD-10-CM

## 2019-05-11 DIAGNOSIS — I2510 Atherosclerotic heart disease of native coronary artery without angina pectoris: Principal | ICD-10-CM

## 2019-05-15 ENCOUNTER — Encounter: Payer: Self-pay | Admitting: Family Medicine

## 2019-05-15 NOTE — Unmapped (Signed)
[facility] Specialty Medication Onboarding    Specialty Medication: REPATHA SURECLICK PENS 140MG /ML  Prior Authorization: Approved   Financial Assistance: Yes - grant approved as secondary   Final Copay/Day Supply: $0 / 28 DAYS    Insurance Restrictions: Yes - max 1 month supply     Notes to Pharmacist:     The triage team has completed the benefits investigation and has determined that the patient is able to fill this medication at [facility]. Please contact the patient to complete the onboarding or follow up with the prescribing physician as needed.

## 2019-05-15 NOTE — Unmapped (Signed)
Followed up with [facility] regarding patient's Repatha.  Spoke with Maisie Fus who confirmed the medication was approved as well as a grant that will cover the cost this morning.  He stated the pharmacy will be contacting him this afternoon or tomorrow morning.  Patient notified via MyChart.

## 2019-05-16 MED FILL — REPATHA SURECLICK 140 MG/ML SUBCUTANEOUS PEN INJECTOR: 28 days supply | Qty: 2 | Fill #0 | Status: AC

## 2019-05-16 NOTE — Unmapped (Signed)
The patient declined counseling on medication administration, missed dose instructions and goals of therapy because he has used Praluent in the past and expressed that he is comfortable with the injection process. The information in the declined sections below are for informational purposes only and was not discussed with patient.       [facility]   Patient Onboarding/Medication Counseling    Ryan Hale is a 44 y.o. male with hyperlipidemia who I am counseling today on initiation of therapy.  I am speaking to the patient.    Was a Nurse, learning disability used for this call? No    Verified patient's date of birth / HIPAA.    Specialty medication(s) to be sent: General Specialty: Repatha      Non-specialty medications/supplies to be sent: sharps      Medications not needed at this time: none         Repatha (evolocumab)    Medication & Administration     Dosage: Inject the contents of 1 pen (140mg ) under the skin every 2 weeks.    Administration: Administer under the skin of the abdomen, thigh or upper arm. Rotate sites with each injection.  ? Injection instructions - Autoinjector:  o Remove 1 Repatha autoinjector from the refrigerator and let stand at room temperature for at least 30 minutes.  o Check the autoinjector for the following:  ? Expiration date  ? Absence of any cracks or damage  ? The medicine is clear and colorless and does not contain any particles  ? The orange cap is present and securely attached  o Choose your injection site and clean with an alcohol wipe. Allow to air dry completely.  o Pull the orange cap straight off and discard  o Pinch the skin (or stretch) with your thumb and fingers creating an area 2 inches wide  o Maintaining the pinch (or stretch) press the pen to your skin at a 90 degree angle. Firmly push the autoinjector down until the skin stops moving and the yellow safety guard is no longer visible.  o Do not touch the gray start button yet  o When you are ready to inject, press the gray start button. You will hear a click that signals the start of the injection  o Continue to press the pen to your skin and lift your thumb  o The injection may take up to 15 seconds. You will know the injection is complete when the medication window turns yellow. You may also hear a second click.  o Remove the pen from your skin and discard the pen in a sharps container.  o If there is blood at the injection site, press a cotton ball or gauze to the site. Do not rub the injection site.    Adherence/Missed dose instructions: Administer a missed dose within 7 days and resume your normal schedule.  If it has been more than 7 days and you inject every 2 weeks, skip the missed dose and resume your normal schedule..     Goals of Therapy     Lower cholesterol, prevention of cardiovascular events in patients with established cardiovascular disease    Side Effects & Monitoring Parameters   ? Flu-like symptoms  ? Signs of a common cold  ? Back pain  ? Injection site irritation  ? Nose or throat irritation    The following side effects should be reported to the provider:  ? Signs of an allergic reaction  ? Signs of high blood  sugar (confusion, drowsiness, increase thirst/hunger/urination, fast breathing, flushing)      Contraindications, Warnings, & Precautions     ? Latex (the packaging of Repatha may contain natural rubber)    Drug/Food Interactions     ? Medication list reviewed in Epic. The patient was instructed to inform the care team before taking any new medications or supplements. No drug interactions identified.     Storage, Handling Precautions, & Disposal   ? Repatha should be stored in the refrigerator. If necessary, Repatha may be kept at room temperature for no more than 30 days.  ? Place used devices in a sharps container for disposal.      Current Medications (including OTC/herbals), Comorbidities and Allergies     Current Outpatient Medications   Medication Sig Dispense Refill   ??? acetaminophen (TYLENOL) 500 MG tablet Take 500 mg by mouth every four (4) hours as needed.      ??? albuterol HFA 90 mcg/actuation inhaler Inhale 2 puffs every six (6) hours as needed.      ??? aspirin 81 MG chewable tablet Chew 1 tablet (81 mg total) daily. 30 tablet 11   ??? atorvastatin (LIPITOR) 10 MG tablet Take 1 tablet (10 mg total) by mouth daily. 30 tablet 6   ??? budesonide (PULMICORT) 90 mcg/actuation inhaler Inhale 1 puff two (2) times a day as needed.      ??? calcium carbonate 300 mg (750 mg) Chew Antacid Extra-Strength 300 mg (750 mg) chewable tablet   take 2 tablets by mouth three times a day if needed for heart BURN     ??? chlorthalidone (HYGROTON) 25 MG tablet Take 1 tablet (25 mg total) by mouth daily. 30 tablet 6   ??? DEXILANT 60 mg capsule daily.      ??? diclofenac sodium (VOLTAREN) 1 % gel Apply 2 g topically two (2) times a day as needed.      ??? evolocumab (REPATHA SURECLICK) 140 mg/mL PnIj Inject the contents of 1 pen (140 mg) under the skin every fourteen (14) days. 2 mL 5   ??? ezetimibe (ZETIA) 10 mg tablet Take 1 tablet (10 mg total) by mouth daily. 90 tablet 3   ??? famotidine (PEPCID) 20 MG tablet Take 1 tablet (20 mg total) by mouth Two (2) times a day. (Patient taking differently: Take 40 mg by mouth two (2) times a day as needed. ) 60 tablet 0   ??? fluticasone propionate (FLONASE) 50 mcg/actuation nasal spray daily as needed.      ??? HYDROcodone-acetaminophen (NORCO) 5-325 mg per tablet Take 0.5-1 tablets by mouth every six (6) hours as needed.      ??? loratadine (CLARITIN) 10 mg tablet Take 10 mg by mouth daily as needed.      ??? MUCINEX DM 60-1,200 mg Tb12 Take 1 tablet by mouth two (2) times a day as needed.      ??? nitroglycerin (NITROSTAT) 0.4 MG SL tablet Place 1 tablet (0.4 mg total) under the tongue every five (5) minutes as needed for chest pain. 30 tablet 2   ??? pregabalin (LYRICA) 25 MG capsule Take 25 mg poqam and 50 mg poqpm for chronic pain     ??? sertraline (ZOLOFT) 25 MG tablet Take 25 mg by mouth daily.     ??? TiZANidine (ZANAFLEX) 4 MG capsule Take 4 mg by mouth Three (3) times a day as needed.        No current facility-administered medications for this visit.  Allergies   Allergen Reactions   ??? Ace Inhibitors Swelling   ??? Lisinopril Swelling   ??? Norvasc [Amlodipine] Palpitations       Patient Active Problem List   Diagnosis   ??? Chest pain   ??? HTN (hypertension)   ??? Anxiety   ??? HLD (hyperlipidemia)   ??? GERD (gastroesophageal reflux disease)   ??? Coronary artery disease involving native coronary artery of native heart without angina pectoris   ??? Adjustment disorder with mixed anxiety and depressed mood   ??? OSA (obstructive sleep apnea)   ??? Near syncope   ??? Statin intolerance   ??? COVID-19 virus infection       Reviewed and up to date in Epic.    Appropriateness of Therapy     Is medication and dose appropriate based on diagnosis? Yes    Prescription has been clinically reviewed: Yes    Baseline Quality of Life Assessment      How many days over the past month did your hyperlipidemia  keep you from your normal activities? For example, brushing your teeth or getting up in the morning. 0    Financial Information     Medication Assistance provided: Prior Authorization and Copay Assistance    Anticipated copay of $5 reviewed with patient. Verified delivery address.    Delivery Information     Scheduled delivery date: 05/16/19    Expected start date: 05/16/19    Medication will be delivered via Same Day Courier to the prescription address in [facility].  This shipment will not require a signature.      Explained the services we provide at [facility] and that each month we would call to set up refills.  Stressed importance of returning phone calls so that we could ensure they receive their medications in time each month.  Informed patient that we should be setting up refills 7-10 days prior to when they will run out of medication.  A pharmacist will reach out to perform a clinical assessment periodically.  Informed patient that a welcome packet and a drug information handout will be sent.      Patient verbalized understanding of the above information as well as how to contact the pharmacy at [phone] option 4 with any questions/concerns.  The pharmacy is open Monday through Friday 8:30am-4:30pm.  A pharmacist is available 24/7 via pager to answer any clinical questions they may have.    Patient Specific Needs     ? Does the patient have any physical, cognitive, or cultural barriers? No    ? Patient prefers to have medications discussed with  Patient     ? Is the patient or caregiver able to read and understand education materials at a high school level or above? Yes    ? Patient's primary language is  English     ? Is the patient high risk? No     ? Does the patient require a Care Management Plan? No     ? Does the patient require physician intervention or other additional services (i.e. nutrition, smoking cessation, social work)? No      Ryan Hale  [facility] Specialty Pharmacist

## 2019-05-22 ENCOUNTER — Telehealth: Payer: Self-pay

## 2019-05-22 DIAGNOSIS — Z79899 Other long term (current) drug therapy: Secondary | ICD-10-CM

## 2019-05-22 DIAGNOSIS — R52 Pain, unspecified: Secondary | ICD-10-CM

## 2019-05-22 DIAGNOSIS — G8929 Other chronic pain: Secondary | ICD-10-CM

## 2019-05-22 DIAGNOSIS — T07XXXA Unspecified multiple injuries, initial encounter: Secondary | ICD-10-CM

## 2019-05-22 DIAGNOSIS — Z79891 Long term (current) use of opiate analgesic: Secondary | ICD-10-CM

## 2019-05-22 MED ORDER — PREGABALIN 25 MG PO CAPS: ORAL_CAPSULE | ORAL | 0 refills | Status: DC

## 2019-05-22 MED ORDER — HYDROCODONE-ACETAMINOPHEN 10-325 MG PO TABS: 0.5000 | ORAL_TABLET | Freq: Two times a day (BID) | ORAL | 0 refills | Status: DC | PRN

## 2019-05-22 NOTE — Telephone Encounter (Signed)
Copied from [place] [id number]. Topic: Appointment Scheduling - Scheduling Inquiry for Clinic >> May 22, 2019  9:48 AM Dale Kennedy D wrote: Reason for CRM: Pt needs a refill on his hydrocodone and will need an appt before Ms. Lucio Edward can fill it.  CB# [id number] >> May 22, 2019 10:14 AM Myatt, Marland Kitchen wrote: Has an appt scheduled

## 2019-05-24 ENCOUNTER — Ambulatory Visit
Admission: RE | Admit: 2019-05-24 | Discharge: 2019-05-24 | Disposition: A | Discharge status: Home / Self Care | Payer: BC Managed Care – PPO | Source: Ambulatory Visit | Attending: Family Medicine | Admitting: Family Medicine

## 2019-05-24 ENCOUNTER — Other Ambulatory Visit: Payer: Self-pay

## 2019-05-24 ENCOUNTER — Encounter: Payer: Self-pay | Admitting: Family Medicine

## 2019-05-24 ENCOUNTER — Ambulatory Visit (INDEPENDENT_AMBULATORY_CARE_PROVIDER_SITE_OTHER): Payer: BC Managed Care – PPO | Admitting: Family Medicine

## 2019-05-24 VITALS — BP 128/88 | HR 98 | Temp 98.7°F | Resp 14 | Ht 74.0 in | Wt 240.0 lb

## 2019-05-24 DIAGNOSIS — J988 Other specified respiratory disorders: Secondary | ICD-10-CM

## 2019-05-24 DIAGNOSIS — F329 Major depressive disorder, single episode, unspecified: Secondary | ICD-10-CM

## 2019-05-24 DIAGNOSIS — F419 Anxiety disorder, unspecified: Secondary | ICD-10-CM

## 2019-05-24 DIAGNOSIS — M79604 Pain in right leg: Secondary | ICD-10-CM

## 2019-05-24 DIAGNOSIS — J4541 Moderate persistent asthma with (acute) exacerbation: Secondary | ICD-10-CM | POA: Diagnosis not present

## 2019-05-24 DIAGNOSIS — B9789 Other viral agents as the cause of diseases classified elsewhere: Secondary | ICD-10-CM

## 2019-05-24 DIAGNOSIS — R0602 Shortness of breath: Secondary | ICD-10-CM | POA: Insufficient documentation

## 2019-05-24 DIAGNOSIS — R0989 Other specified symptoms and signs involving the circulatory and respiratory systems: Secondary | ICD-10-CM | POA: Insufficient documentation

## 2019-05-24 DIAGNOSIS — T07XXXA Unspecified multiple injuries, initial encounter: Secondary | ICD-10-CM | POA: Diagnosis not present

## 2019-05-24 DIAGNOSIS — G4700 Insomnia, unspecified: Secondary | ICD-10-CM

## 2019-05-24 DIAGNOSIS — M791 Myalgia, unspecified site: Secondary | ICD-10-CM | POA: Diagnosis not present

## 2019-05-24 DIAGNOSIS — M79605 Pain in left leg: Secondary | ICD-10-CM

## 2019-05-24 DIAGNOSIS — G8929 Other chronic pain: Secondary | ICD-10-CM

## 2019-05-24 DIAGNOSIS — M25552 Pain in left hip: Secondary | ICD-10-CM

## 2019-05-24 MED ORDER — BUDESONIDE-FORMOTEROL FUMARATE 160-4.5 MCG/ACT IN AERO: 2.0000 | INHALATION_SPRAY | Freq: Two times a day (BID) | RESPIRATORY_TRACT | 3 refills | Status: DC

## 2019-05-24 MED ORDER — ALBUTEROL SULFATE HFA 108 (90 BASE) MCG/ACT IN AERS: 2.0000 | INHALATION_SPRAY | Freq: Four times a day (QID) | RESPIRATORY_TRACT | 2 refills | Status: DC | PRN

## 2019-05-24 MED ORDER — SERTRALINE HCL 25 MG PO TABS: 25.0000 mg | ORAL_TABLET | Freq: Every day | ORAL | 3 refills | Status: DC

## 2019-05-24 MED ORDER — BUDESONIDE-FORMOTEROL HFA 160 MCG-4.5 MCG/ACTUATION AEROSOL INHALER
RESPIRATORY_TRACT | 0 days
Start: 2019-05-24 — End: ?
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
## 2019-05-25 ENCOUNTER — Ambulatory Visit: Payer: BC Managed Care – PPO

## 2019-05-25 ENCOUNTER — Encounter
Admit: 2019-05-25 | Discharge: 2019-05-25 | Disposition: A | Discharge status: Home / Self Care | Payer: BLUE CROSS/BLUE SHIELD

## 2019-05-25 DIAGNOSIS — R0602 Shortness of breath: Principal | ICD-10-CM

## 2019-05-25 DIAGNOSIS — R0789 Other chest pain: Principal | ICD-10-CM

## 2019-05-25 DIAGNOSIS — Z87891 Personal history of nicotine dependence: Secondary | ICD-10-CM | POA: Diagnosis not present

## 2019-05-25 DIAGNOSIS — Z79899 Other long term (current) drug therapy: Secondary | ICD-10-CM | POA: Diagnosis not present

## 2019-05-25 DIAGNOSIS — J45909 Unspecified asthma, uncomplicated: Secondary | ICD-10-CM | POA: Diagnosis not present

## 2019-05-25 DIAGNOSIS — R002 Palpitations: Secondary | ICD-10-CM | POA: Diagnosis not present

## 2019-05-25 DIAGNOSIS — Z7982 Long term (current) use of aspirin: Secondary | ICD-10-CM | POA: Diagnosis not present

## 2019-05-25 DIAGNOSIS — Z888 Allergy status to other drugs, medicaments and biological substances status: Secondary | ICD-10-CM | POA: Diagnosis not present

## 2019-05-25 DIAGNOSIS — E785 Hyperlipidemia, unspecified: Secondary | ICD-10-CM | POA: Diagnosis not present

## 2019-05-25 DIAGNOSIS — Z7951 Long term (current) use of inhaled steroids: Secondary | ICD-10-CM | POA: Diagnosis not present

## 2019-05-25 DIAGNOSIS — I252 Old myocardial infarction: Secondary | ICD-10-CM | POA: Diagnosis not present

## 2019-05-25 DIAGNOSIS — I10 Essential (primary) hypertension: Secondary | ICD-10-CM | POA: Diagnosis not present

## 2019-05-25 DIAGNOSIS — F419 Anxiety disorder, unspecified: Secondary | ICD-10-CM | POA: Diagnosis not present

## 2019-05-25 DIAGNOSIS — I4891 Unspecified atrial fibrillation: Secondary | ICD-10-CM | POA: Diagnosis not present

## 2019-05-25 DIAGNOSIS — I2510 Atherosclerotic heart disease of native coronary artery without angina pectoris: Secondary | ICD-10-CM | POA: Diagnosis not present

## 2019-05-25 DIAGNOSIS — Z955 Presence of coronary angioplasty implant and graft: Secondary | ICD-10-CM | POA: Diagnosis not present

## 2019-05-25 LAB — CBC W/ AUTO DIFF
BASOPHILS ABSOLUTE COUNT: 0.1 10*9/L (ref 0.0–0.1)
BASOPHILS RELATIVE PERCENT: 0.8 %
EOSINOPHILS ABSOLUTE COUNT: 0.1 10*9/L (ref 0.0–0.7)
EOSINOPHILS RELATIVE PERCENT: 1.5 %
HEMATOCRIT: 40.8 % (ref 38.0–50.0)
HEMOGLOBIN: 13 g/dL — ABNORMAL LOW (ref 13.5–17.5)
LYMPHOCYTES ABSOLUTE COUNT: 1.4 10*9/L (ref 0.7–4.0)
LYMPHOCYTES RELATIVE PERCENT: 20.8 %
MEAN CORPUSCULAR HEMOGLOBIN CONC: 31.8 g/dL (ref 30.0–36.0)
MEAN CORPUSCULAR HEMOGLOBIN: 22.8 pg — ABNORMAL LOW (ref 26.0–34.0)
MEAN CORPUSCULAR VOLUME: 71.7 fL — ABNORMAL LOW (ref 81.0–95.0)
MEAN PLATELET VOLUME: 7.6 fL (ref 7.0–10.0)
MONOCYTES RELATIVE PERCENT: 11.4 %
NEUTROPHILS ABSOLUTE COUNT: 4.3 10*9/L (ref 1.7–7.7)
NEUTROPHILS RELATIVE PERCENT: 65.5 %
RED BLOOD CELL COUNT: 5.69 10*12/L (ref 4.32–5.72)
RED CELL DISTRIBUTION WIDTH: 17.6 % — ABNORMAL HIGH (ref 12.0–15.0)
WBC ADJUSTED: 6.6 10*9/L (ref 3.5–10.5)

## 2019-05-25 LAB — BASIC METABOLIC PANEL
ANION GAP: 13 mmol/L (ref 7–15)
CALCIUM: 9 mg/dL (ref 8.5–10.2)
CHLORIDE: 101 mmol/L (ref 98–107)
CO2: 25 mmol/L (ref 22.0–30.0)
CREATININE: 1.04 mg/dL (ref 0.70–1.30)
EGFR CKD-EPI AA MALE: >90 mL/min/{1.73_m2} (ref >=60)

## 2019-05-25 LAB — TROPONIN I
Troponin I.cardiac:MCnc:Pt:Ser/Plas:Qn:: <0.06
Troponin I.cardiac:MCnc:Pt:Ser/Plas:Qn:: <0.06

## 2019-05-25 LAB — CO2: Carbon dioxide:SCnc:Pt:Ser/Plas:Qn:: 25

## 2019-05-25 LAB — MONOCYTES RELATIVE PERCENT: Monocytes/100 leukocytes:NFr:Pt:Bld:Qn:Automated count: 11.4
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
## 2019-05-26 ENCOUNTER — Other Ambulatory Visit: Payer: Self-pay | Admitting: Family Medicine

## 2019-05-26 MED ORDER — AZITHROMYCIN 250 MG PO TABS: ORAL_TABLET | ORAL | 0 refills | Status: DC

## 2019-05-26 MED ORDER — ASPIRIN 81 MG PO CHEW
243.00 | CHEWABLE_TABLET | ORAL | Status: DC
Start: 2019-05-26 — End: 2019-05-26

## 2019-05-26 MED ORDER — BENZONATATE 100 MG PO CAPS: 100.0000 mg | ORAL_CAPSULE | Freq: Three times a day (TID) | ORAL | 0 refills | Status: DC | PRN

## 2019-05-26 MED ORDER — PREDNISONE 20 MG PO TABS: ORAL_TABLET | ORAL | 0 refills | Status: DC

## 2019-05-31 ENCOUNTER — Encounter: Payer: Self-pay | Admitting: Family Medicine

## 2019-06-02 NOTE — Unmapped (Signed)
[facility] Clinical Assessment & Refill Coordination Note    Ryan Hale, DOB: December 03, 1975  Phone: [phone] (home)     All above HIPAA information was verified with patient.     Was a Nurse, learning disability used for this call? No    Specialty Medication(s):   General Specialty: Repatha     Current Outpatient Medications   Medication Sig Dispense Refill   ??? acetaminophen (TYLENOL) 500 MG tablet Take 500 mg by mouth every four (4) hours as needed.      ??? albuterol HFA 90 mcg/actuation inhaler Inhale 2 puffs every six (6) hours as needed.      ??? aspirin 81 MG chewable tablet Chew 1 tablet (81 mg total) daily. 30 tablet 11   ??? atorvastatin (LIPITOR) 10 MG tablet Take 1 tablet (10 mg total) by mouth daily. 30 tablet 6   ??? budesonide (PULMICORT) 90 mcg/actuation inhaler Inhale 1 puff two (2) times a day as needed.      ??? calcium carbonate 300 mg (750 mg) Chew Antacid Extra-Strength 300 mg (750 mg) chewable tablet   take 2 tablets by mouth three times a day if needed for heart BURN     ??? chlorthalidone (HYGROTON) 25 MG tablet Take 1 tablet (25 mg total) by mouth daily. 30 tablet 6   ??? DEXILANT 60 mg capsule daily.      ??? diclofenac sodium (VOLTAREN) 1 % gel Apply 2 g topically two (2) times a day as needed.      ??? evolocumab (REPATHA SURECLICK) 140 mg/mL PnIj Inject the contents of 1 pen (140 mg) under the skin every fourteen (14) days. 2 mL 5   ??? ezetimibe (ZETIA) 10 mg tablet Take 1 tablet (10 mg total) by mouth daily. 90 tablet 3   ??? famotidine (PEPCID) 20 MG tablet Take 1 tablet (20 mg total) by mouth Two (2) times a day. (Patient taking differently: Take 40 mg by mouth two (2) times a day as needed. ) 60 tablet 0   ??? fluticasone propionate (FLONASE) 50 mcg/actuation nasal spray daily as needed.      ??? HYDROcodone-acetaminophen (NORCO) 5-325 mg per tablet Take 0.5-1 tablets by mouth every six (6) hours as needed.      ??? loratadine (CLARITIN) 10 mg tablet Take 10 mg by mouth daily as needed. ??? MUCINEX DM 60-1,200 mg Tb12 Take 1 tablet by mouth two (2) times a day as needed.      ??? nitroglycerin (NITROSTAT) 0.4 MG SL tablet Place 1 tablet (0.4 mg total) under the tongue every five (5) minutes as needed for chest pain. 30 tablet 2   ??? pregabalin (LYRICA) 25 MG capsule Take 25 mg poqam and 50 mg poqpm for chronic pain     ??? sertraline (ZOLOFT) 25 MG tablet Take 25 mg by mouth daily.     ??? TiZANidine (ZANAFLEX) 4 MG capsule Take 4 mg by mouth Three (3) times a day as needed.        No current facility-administered medications for this visit.         Changes to medications: Marina reports no changes at this time.    Allergies   Allergen Reactions   ??? Ace Inhibitors Swelling   ??? Lisinopril Swelling   ??? Norvasc [Amlodipine] Palpitations       Changes to allergies: No    SPECIALTY MEDICATION ADHERENCE     Repatha 140 mg/ml: 0 days of medicine on hand  Medication Adherence    Patient reported X missed doses in the last month: 0  Specialty Medication: Repatha  Patient is on additional specialty medications: No  Informant: patient          Specialty medication(s) dose(s) confirmed: Regimen is correct and unchanged.     Are there any concerns with adherence? No    Adherence counseling provided? Not needed    CLINICAL MANAGEMENT AND INTERVENTION      Clinical Benefit Assessment:    Do you feel the medicine is effective or helping your condition? Unable to determine at this time - blood work has not been done since starting    Clinical Benefit counseling provided? Not needed    Adverse Effects Assessment:    Are you experiencing any side effects? No    Are you experiencing difficulty administering your medicine? No    Quality of Life Assessment:    How many days over the past month did your hyperlipidemia  keep you from your normal activities? For example, brushing your teeth or getting up in the morning. 0    Have you discussed this with your provider? Not needed    Therapy Appropriateness:    Is therapy appropriate? Yes, therapy is appropriate and should be continued    DISEASE/MEDICATION-SPECIFIC INFORMATION      For patients on injectable medications: Patient currently has 0 doses left.  Next injection is scheduled for 06/15/19.    PATIENT SPECIFIC NEEDS     ? Does the patient have any physical, cognitive, or cultural barriers? No    ? Is the patient high risk? No     ? Does the patient require a Care Management Plan? No     ? Does the patient require physician intervention or other additional services (i.e. nutrition, smoking cessation, social work)? No      SHIPPING     Specialty Medication(s) to be Shipped:   General Specialty: Repatha    Other medication(s) to be shipped: none     Changes to insurance: No    Delivery Scheduled: Yes, Expected medication delivery date: 06/07/19.     Medication will be delivered via Same Day Courier to the confirmed prescription address in [facility].    The patient will receive a drug information handout for each medication shipped and additional FDA Medication Guides as required.  Verified that patient has previously received a Conservation officer, historic buildings.    All of the patient's questions and concerns have been addressed.    Arnold Long   [facility] Specialty Pharmacist

## 2019-06-07 NOTE — Unmapped (Signed)
Ryan Hale called back about the delivery for Repatha and would like the delivery to ship out 06/12/19 via Same Day courier on 06/12/19 . We have confirmed the delivery via [employer] [facility] [employer](Same Day Courier). Marland Kitchen

## 2019-06-07 NOTE — Unmapped (Signed)
Ryan Hale 's Repatha shipment will be delayed as a result of the medication is too soon to refill until 06/10/19.     I have reached out to the patient and was unable to leave a message. We will wait for a call back from the patient to reschedule the delivery.  We have not confirmed the new delivery date.  via same day courier

## 2019-06-09 ENCOUNTER — Inpatient Hospital Stay
Admission: EM | Admit: 2019-06-09 | Discharge: 2019-06-11 | DRG: 395 | Disposition: A | Discharge status: Home / Self Care | Payer: BC Managed Care – PPO | Attending: Internal Medicine | Admitting: Internal Medicine

## 2019-06-09 ENCOUNTER — Encounter: Payer: Self-pay | Admitting: Emergency Medicine

## 2019-06-09 ENCOUNTER — Other Ambulatory Visit: Payer: Self-pay

## 2019-06-09 DIAGNOSIS — Z8249 Family history of ischemic heart disease and other diseases of the circulatory system: Secondary | ICD-10-CM

## 2019-06-09 DIAGNOSIS — G8929 Other chronic pain: Secondary | ICD-10-CM

## 2019-06-09 DIAGNOSIS — Z955 Presence of coronary angioplasty implant and graft: Secondary | ICD-10-CM

## 2019-06-09 DIAGNOSIS — G894 Chronic pain syndrome: Secondary | ICD-10-CM | POA: Diagnosis not present

## 2019-06-09 DIAGNOSIS — E669 Obesity, unspecified: Secondary | ICD-10-CM | POA: Diagnosis present

## 2019-06-09 DIAGNOSIS — I509 Heart failure, unspecified: Secondary | ICD-10-CM | POA: Diagnosis not present

## 2019-06-09 DIAGNOSIS — I10 Essential (primary) hypertension: Secondary | ICD-10-CM | POA: Diagnosis present

## 2019-06-09 DIAGNOSIS — K2980 Duodenitis without bleeding: Secondary | ICD-10-CM | POA: Diagnosis present

## 2019-06-09 DIAGNOSIS — I2510 Atherosclerotic heart disease of native coronary artery without angina pectoris: Secondary | ICD-10-CM | POA: Diagnosis not present

## 2019-06-09 DIAGNOSIS — Z79891 Long term (current) use of opiate analgesic: Secondary | ICD-10-CM | POA: Diagnosis not present

## 2019-06-09 DIAGNOSIS — K921 Melena: Secondary | ICD-10-CM | POA: Diagnosis not present

## 2019-06-09 DIAGNOSIS — G4700 Insomnia, unspecified: Secondary | ICD-10-CM | POA: Diagnosis not present

## 2019-06-09 DIAGNOSIS — J302 Other seasonal allergic rhinitis: Secondary | ICD-10-CM | POA: Diagnosis present

## 2019-06-09 DIAGNOSIS — I272 Pulmonary hypertension, unspecified: Secondary | ICD-10-CM | POA: Diagnosis present

## 2019-06-09 DIAGNOSIS — E785 Hyperlipidemia, unspecified: Secondary | ICD-10-CM | POA: Diagnosis present

## 2019-06-09 DIAGNOSIS — K625 Hemorrhage of anus and rectum: Secondary | ICD-10-CM | POA: Diagnosis present

## 2019-06-09 DIAGNOSIS — Z7951 Long term (current) use of inhaled steroids: Secondary | ICD-10-CM | POA: Diagnosis not present

## 2019-06-09 DIAGNOSIS — Z20822 Contact with and (suspected) exposure to covid-19: Secondary | ICD-10-CM | POA: Diagnosis not present

## 2019-06-09 DIAGNOSIS — I4891 Unspecified atrial fibrillation: Secondary | ICD-10-CM | POA: Diagnosis not present

## 2019-06-09 DIAGNOSIS — T502X5A Adverse effect of carbonic-anhydrase inhibitors, benzothiadiazides and other diuretics, initial encounter: Secondary | ICD-10-CM | POA: Diagnosis present

## 2019-06-09 DIAGNOSIS — Z7982 Long term (current) use of aspirin: Secondary | ICD-10-CM

## 2019-06-09 DIAGNOSIS — Z888 Allergy status to other drugs, medicaments and biological substances status: Secondary | ICD-10-CM

## 2019-06-09 DIAGNOSIS — K922 Gastrointestinal hemorrhage, unspecified: Secondary | ICD-10-CM | POA: Diagnosis not present

## 2019-06-09 DIAGNOSIS — Z79899 Other long term (current) drug therapy: Secondary | ICD-10-CM | POA: Diagnosis not present

## 2019-06-09 DIAGNOSIS — K219 Gastro-esophageal reflux disease without esophagitis: Secondary | ICD-10-CM | POA: Diagnosis present

## 2019-06-09 DIAGNOSIS — K640 First degree hemorrhoids: Secondary | ICD-10-CM | POA: Diagnosis not present

## 2019-06-09 DIAGNOSIS — I110 Hypertensive heart disease with heart failure: Secondary | ICD-10-CM | POA: Diagnosis not present

## 2019-06-09 DIAGNOSIS — Z6832 Body mass index (BMI) 32.0-32.9, adult: Secondary | ICD-10-CM | POA: Diagnosis not present

## 2019-06-09 DIAGNOSIS — E876 Hypokalemia: Secondary | ICD-10-CM | POA: Diagnosis not present

## 2019-06-09 DIAGNOSIS — Z8616 Personal history of COVID-19: Secondary | ICD-10-CM

## 2019-06-09 DIAGNOSIS — Z9049 Acquired absence of other specified parts of digestive tract: Secondary | ICD-10-CM | POA: Diagnosis not present

## 2019-06-09 DIAGNOSIS — Z87891 Personal history of nicotine dependence: Secondary | ICD-10-CM

## 2019-06-09 DIAGNOSIS — R52 Pain, unspecified: Secondary | ICD-10-CM

## 2019-06-09 DIAGNOSIS — K649 Unspecified hemorrhoids: Secondary | ICD-10-CM | POA: Diagnosis not present

## 2019-06-09 LAB — COMPREHENSIVE METABOLIC PANEL
ALT: 19 U/L (ref 0–44)
AST: 17 U/L (ref 15–41)
Albumin: 2.8 g/dL — ABNORMAL LOW (ref 3.5–5.0)
Alkaline Phosphatase: 83 U/L (ref 38–126)
Anion gap: 7 (ref 5–15)
BUN: 17 mg/dL (ref 6–20)
CO2: 19 mmol/L — ABNORMAL LOW (ref 22–32)
Calcium: 6.8 mg/dL — ABNORMAL LOW (ref 8.9–10.3)
Chloride: 112 mmol/L — ABNORMAL HIGH (ref 98–111)
Creatinine, Ser: 0.81 mg/dL (ref 0.61–1.24)
GFR calc Af Amer: >60 mL/min (ref >60)
GFR calc non Af Amer: >60 mL/min (ref >60)
Glucose, Bld: 75 mg/dL (ref 70–99)
Potassium: 2.7 mmol/L — CL (ref 3.5–5.1)
Sodium: 138 mmol/L (ref 135–145)
Total Bilirubin: 0.6 mg/dL (ref 0.3–1.2)
Total Protein: 6.6 g/dL (ref 6.5–8.1)

## 2019-06-09 LAB — CBC WITH DIFFERENTIAL/PLATELET
Abs Immature Granulocytes: 0.03 10*3/uL (ref 0.00–0.07)
Basophils Absolute: 0.1 10*3/uL (ref 0.0–0.1)
Basophils Relative: 1 %
Eosinophils Absolute: 0.1 10*3/uL (ref 0.0–0.5)
Eosinophils Relative: 1 %
HCT: 42.2 % (ref 39.0–52.0)
Hemoglobin: 13 g/dL (ref 13.0–17.0)
Immature Granulocytes: 1 %
Lymphocytes Relative: 18 %
Lymphs Abs: 1.1 10*3/uL (ref 0.7–4.0)
MCH: 23.2 pg — ABNORMAL LOW (ref 26.0–34.0)
MCHC: 30.8 g/dL (ref 30.0–36.0)
MCV: 75.2 fL — ABNORMAL LOW (ref 80.0–100.0)
Monocytes Absolute: 0.6 10*3/uL (ref 0.1–1.0)
Monocytes Relative: 10 %
Neutro Abs: 4.4 10*3/uL (ref 1.7–7.7)
Neutrophils Relative %: 69 %
Platelets: 387 10*3/uL (ref 150–400)
RBC: 5.61 MIL/uL (ref 4.22–5.81)
RDW: 17.1 % — ABNORMAL HIGH (ref 11.5–15.5)
WBC: 6.2 10*3/uL (ref 4.0–10.5)
nRBC: 0 % (ref 0.0–0.2)

## 2019-06-09 LAB — CBC
HCT: 46.1 % (ref 39.0–52.0)
Hemoglobin: 13.7 g/dL (ref 13.0–17.0)
MCH: 22.9 pg — ABNORMAL LOW (ref 26.0–34.0)
MCHC: 29.7 g/dL — ABNORMAL LOW (ref 30.0–36.0)
MCV: 77.2 fL — ABNORMAL LOW (ref 80.0–100.0)
Platelets: 422 10*3/uL — ABNORMAL HIGH (ref 150–400)
RBC: 5.97 MIL/uL — ABNORMAL HIGH (ref 4.22–5.81)
RDW: 17.7 % — ABNORMAL HIGH (ref 11.5–15.5)
WBC: 7.4 10*3/uL (ref 4.0–10.5)
nRBC: 0 % (ref 0.0–0.2)

## 2019-06-09 LAB — PROTIME-INR
INR: 1.1 (ref 0.8–1.2)
Prothrombin Time: 13.8 seconds (ref 11.4–15.2)

## 2019-06-09 LAB — RESPIRATORY PANEL BY RT PCR (FLU A&B, COVID)
Influenza A by PCR: NEGATIVE
Influenza B by PCR: NEGATIVE
SARS Coronavirus 2 by RT PCR: NEGATIVE

## 2019-06-09 LAB — MAGNESIUM: Magnesium: 2.3 mg/dL (ref 1.7–2.4)

## 2019-06-09 LAB — TYPE AND SCREEN
ABO/RH(D): O POS
Antibody Screen: NEGATIVE

## 2019-06-09 MED ORDER — LORATADINE 10 MG PO TABS
10.0000 mg | ORAL_TABLET | Freq: Every day | ORAL | Status: DC | PRN
  Administered 2019-06-10: 10 mg via ORAL
  Filled 2019-06-09: qty 1

## 2019-06-09 MED ORDER — DICLOFENAC SODIUM 1 % TD GEL
2.0000 g | Freq: Four times a day (QID) | TRANSDERMAL | Status: DC | PRN
  Filled 2019-06-09: qty 100

## 2019-06-09 MED ORDER — MOMETASONE FURO-FORMOTEROL FUM 200-5 MCG/ACT IN AERO
2.0000 | INHALATION_SPRAY | Freq: Two times a day (BID) | RESPIRATORY_TRACT | Status: DC
  Filled 2019-06-09: qty 8.8

## 2019-06-09 MED ORDER — SERTRALINE HCL 50 MG PO TABS
25.0000 mg | ORAL_TABLET | Freq: Every day | ORAL | Status: DC
  Administered 2019-06-09 – 2019-06-10 (×2): 25 mg via ORAL
  Filled 2019-06-09 (×2): qty 1

## 2019-06-09 MED ORDER — PREGABALIN 25 MG PO CAPS: 25.0000 mg | ORAL_CAPSULE | Freq: Two times a day (BID) | ORAL | Status: DC

## 2019-06-09 MED ORDER — POTASSIUM CHLORIDE CRYS ER 20 MEQ PO TBCR
40.0000 meq | EXTENDED_RELEASE_TABLET | Freq: Once | ORAL | Status: AC
  Administered 2019-06-09: 40 meq via ORAL
  Filled 2019-06-09: qty 2

## 2019-06-09 MED ORDER — CHLORTHALIDONE 25 MG PO TABS
25.0000 mg | ORAL_TABLET | Freq: Every day | ORAL | Status: DC
  Administered 2019-06-09 – 2019-06-10 (×2): 25 mg via ORAL
  Filled 2019-06-09 (×3): qty 1

## 2019-06-09 MED ORDER — POTASSIUM CHLORIDE IN NACL 40-0.9 MEQ/L-% IV SOLN
INTRAVENOUS | Status: DC
  Administered 2019-06-09 – 2019-06-10 (×2): 100 mL/h via INTRAVENOUS
  Filled 2019-06-09 (×4): qty 1000

## 2019-06-09 MED ORDER — PANTOPRAZOLE SODIUM 40 MG PO TBEC
40.0000 mg | DELAYED_RELEASE_TABLET | Freq: Every day | ORAL | Status: DC
  Administered 2019-06-09 – 2019-06-10 (×2): 40 mg via ORAL
  Filled 2019-06-09 (×2): qty 1

## 2019-06-09 MED ORDER — HYDROCODONE-ACETAMINOPHEN 10-325 MG PO TABS
0.5000 | ORAL_TABLET | Freq: Two times a day (BID) | ORAL | Status: DC | PRN
  Administered 2019-06-09 – 2019-06-10 (×3): 1 via ORAL
  Filled 2019-06-09 (×3): qty 1

## 2019-06-09 MED ORDER — POTASSIUM CHLORIDE CRYS ER 20 MEQ PO TBCR: 40.0000 meq | EXTENDED_RELEASE_TABLET | Freq: Once | ORAL | Status: DC

## 2019-06-09 MED ORDER — NITROGLYCERIN 0.4 MG SL SUBL: 0.4000 mg | SUBLINGUAL_TABLET | SUBLINGUAL | Status: DC | PRN

## 2019-06-09 NOTE — ED Notes (Signed)
Date and time results received: 06/09/19 11:51 AM\  (use smartphrase ".now" to insert current time)  Test: Potassium Critical Value: 2.7  Name of Provider Notified: Dr. Quentin Cornwall  Orders Received? Or Actions Taken?: No new orders at this time
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
## 2019-06-09 NOTE — ED Notes (Signed)
Pt had bowel movement with moderate amount of red blood noted. MD made aware.

## 2019-06-09 NOTE — Progress Notes (Signed)
    BRIEF OVERNIGHT PROGRESS REPORT   SUBJECTIVE: Patient continued to have episodes of bright red rectal bleed.  He is concerned that nothing is being done regarding the bleed and wishes to leave AMA.  He voices concerns that he is losing a lot of blood and would like to go to [facility].  OBJECTIVE: Patient evaluated at the bedside, he is afebrile with  Most recent blood pressure 134/89 mm Hg and pulse rate 81 beats/min. There were no focal neurological deficits; he was alert and oriented x4.  ASSESSMENT:43 y.o. male with medical history significant for coronary artery disease status post stent angioplasty, hypertension, GERD and CHF admitted for evaluation of rectal bleed.  PLAN: Acute rectal bleed without evidence hemodynamic instability Hx significant for iron deficiency anemia, peptic duodenitis, IBS, internal hemorrhoids? - IVF resuscitation to maintain MAP>65 - H&H monitoring q6h - Blood Consent.  Transfuse PRN Hgb<8 - Pantoprazole 40 mg po - GI Consult - Hold NSAIDs, steroids, ASA  Note: I had a discussion with patient and wife at the bedside, reassured him that GI was onboard and will follow. Currently he is hemodynamically stable with stable H&H and therefore no need for urgent intervention. Will await further recommendations from GI.   Rufina Falco, DNP, CCRN, FNP-C [facility] Nurse Practitioner Between 7pm to 7am - Pager [phone]  After 7am go to [email or web address] - [email or web address] [facility]  [facility] [employer]  [phone]

## 2019-06-09 NOTE — ED Notes (Signed)
MD at bedside. 

## 2019-06-09 NOTE — ED Triage Notes (Signed)
Pt reports bright red rectal bleeding with bowel movements. Has had some LLQ cramping that is mild.  Pt reports has been a lot of blood.  Has been taken off blood thinners as he is s/p 1 year from stents.

## 2019-06-09 NOTE — ED Notes (Signed)
Pt now also reports intermittent left shoulder and arm pain. + cardiac hx.

## 2019-06-10 DIAGNOSIS — I2510 Atherosclerotic heart disease of native coronary artery without angina pectoris: Secondary | ICD-10-CM | POA: Diagnosis not present

## 2019-06-10 DIAGNOSIS — G894 Chronic pain syndrome: Secondary | ICD-10-CM | POA: Diagnosis present

## 2019-06-10 DIAGNOSIS — K922 Gastrointestinal hemorrhage, unspecified: Secondary | ICD-10-CM | POA: Diagnosis not present

## 2019-06-10 DIAGNOSIS — Z7982 Long term (current) use of aspirin: Secondary | ICD-10-CM | POA: Diagnosis not present

## 2019-06-10 DIAGNOSIS — Z79891 Long term (current) use of opiate analgesic: Secondary | ICD-10-CM | POA: Diagnosis not present

## 2019-06-10 DIAGNOSIS — K625 Hemorrhage of anus and rectum: Secondary | ICD-10-CM

## 2019-06-10 DIAGNOSIS — I110 Hypertensive heart disease with heart failure: Secondary | ICD-10-CM | POA: Diagnosis not present

## 2019-06-10 DIAGNOSIS — Z20822 Contact with and (suspected) exposure to covid-19: Secondary | ICD-10-CM | POA: Diagnosis present

## 2019-06-10 DIAGNOSIS — Z888 Allergy status to other drugs, medicaments and biological substances status: Secondary | ICD-10-CM | POA: Diagnosis not present

## 2019-06-10 DIAGNOSIS — Z6832 Body mass index (BMI) 32.0-32.9, adult: Secondary | ICD-10-CM | POA: Diagnosis not present

## 2019-06-10 DIAGNOSIS — I272 Pulmonary hypertension, unspecified: Secondary | ICD-10-CM | POA: Diagnosis present

## 2019-06-10 DIAGNOSIS — K921 Melena: Secondary | ICD-10-CM | POA: Diagnosis present

## 2019-06-10 DIAGNOSIS — J302 Other seasonal allergic rhinitis: Secondary | ICD-10-CM | POA: Diagnosis present

## 2019-06-10 DIAGNOSIS — I4891 Unspecified atrial fibrillation: Secondary | ICD-10-CM | POA: Diagnosis present

## 2019-06-10 DIAGNOSIS — I10 Essential (primary) hypertension: Secondary | ICD-10-CM | POA: Diagnosis not present

## 2019-06-10 DIAGNOSIS — Z8249 Family history of ischemic heart disease and other diseases of the circulatory system: Secondary | ICD-10-CM | POA: Diagnosis not present

## 2019-06-10 DIAGNOSIS — E669 Obesity, unspecified: Secondary | ICD-10-CM | POA: Diagnosis present

## 2019-06-10 DIAGNOSIS — Z79899 Other long term (current) drug therapy: Secondary | ICD-10-CM | POA: Diagnosis not present

## 2019-06-10 DIAGNOSIS — Z955 Presence of coronary angioplasty implant and graft: Secondary | ICD-10-CM | POA: Diagnosis not present

## 2019-06-10 DIAGNOSIS — Z8616 Personal history of COVID-19: Secondary | ICD-10-CM | POA: Diagnosis not present

## 2019-06-10 DIAGNOSIS — I509 Heart failure, unspecified: Secondary | ICD-10-CM | POA: Diagnosis not present

## 2019-06-10 DIAGNOSIS — K649 Unspecified hemorrhoids: Secondary | ICD-10-CM | POA: Diagnosis not present

## 2019-06-10 DIAGNOSIS — E876 Hypokalemia: Secondary | ICD-10-CM | POA: Diagnosis present

## 2019-06-10 DIAGNOSIS — K219 Gastro-esophageal reflux disease without esophagitis: Secondary | ICD-10-CM | POA: Diagnosis present

## 2019-06-10 DIAGNOSIS — K2980 Duodenitis without bleeding: Secondary | ICD-10-CM | POA: Diagnosis present

## 2019-06-10 DIAGNOSIS — E785 Hyperlipidemia, unspecified: Secondary | ICD-10-CM | POA: Diagnosis present

## 2019-06-10 DIAGNOSIS — K640 First degree hemorrhoids: Secondary | ICD-10-CM | POA: Diagnosis present

## 2019-06-10 DIAGNOSIS — Z7951 Long term (current) use of inhaled steroids: Secondary | ICD-10-CM | POA: Diagnosis not present

## 2019-06-10 DIAGNOSIS — Z9049 Acquired absence of other specified parts of digestive tract: Secondary | ICD-10-CM | POA: Diagnosis not present

## 2019-06-10 LAB — CBC
HCT: 39.6 % (ref 39.0–52.0)
HCT: 40 % (ref 39.0–52.0)
Hemoglobin: 12 g/dL — ABNORMAL LOW (ref 13.0–17.0)
Hemoglobin: 12.2 g/dL — ABNORMAL LOW (ref 13.0–17.0)
MCH: 22.7 pg — ABNORMAL LOW (ref 26.0–34.0)
MCH: 22.9 pg — ABNORMAL LOW (ref 26.0–34.0)
MCHC: 30.3 g/dL (ref 30.0–36.0)
MCHC: 30.5 g/dL (ref 30.0–36.0)
MCV: 74.5 fL — ABNORMAL LOW (ref 80.0–100.0)
MCV: 75.4 fL — ABNORMAL LOW (ref 80.0–100.0)
Platelets: 379 10*3/uL (ref 150–400)
Platelets: 402 10*3/uL — ABNORMAL HIGH (ref 150–400)
RBC: 5.25 MIL/uL (ref 4.22–5.81)
RBC: 5.37 MIL/uL (ref 4.22–5.81)
RDW: 17.2 % — ABNORMAL HIGH (ref 11.5–15.5)
RDW: 17.2 % — ABNORMAL HIGH (ref 11.5–15.5)
WBC: 5.3 10*3/uL (ref 4.0–10.5)
WBC: 6.6 10*3/uL (ref 4.0–10.5)
nRBC: 0 % (ref 0.0–0.2)
nRBC: 0 % (ref 0.0–0.2)

## 2019-06-10 LAB — BASIC METABOLIC PANEL
Anion gap: 10 (ref 5–15)
BUN: 18 mg/dL (ref 6–20)
CO2: 23 mmol/L (ref 22–32)
Calcium: 8.7 mg/dL — ABNORMAL LOW (ref 8.9–10.3)
Chloride: 103 mmol/L (ref 98–111)
Creatinine, Ser: 1.09 mg/dL (ref 0.61–1.24)
GFR calc Af Amer: >60 mL/min (ref >60)
GFR calc non Af Amer: >60 mL/min (ref >60)
Glucose, Bld: 94 mg/dL (ref 70–99)
Potassium: 4.3 mmol/L (ref 3.5–5.1)
Sodium: 136 mmol/L (ref 135–145)

## 2019-06-10 LAB — HEMOGLOBIN AND HEMATOCRIT, BLOOD
HCT: 39.8 % (ref 39.0–52.0)
Hemoglobin: 12.2 g/dL — ABNORMAL LOW (ref 13.0–17.0)

## 2019-06-10 LAB — HIV ANTIBODY (ROUTINE TESTING W REFLEX): HIV Screen 4th Generation wRfx: NONREACTIVE

## 2019-06-10 MED ORDER — SODIUM CHLORIDE 0.9 % IV SOLN
INTRAVENOUS | Status: DC
  Administered 2019-06-10: 10 mL/h via INTRAVENOUS

## 2019-06-10 MED ORDER — PEG 3350-KCL-NA BICARB-NACL 420 G PO SOLR
4000.0000 mL | Freq: Once | ORAL | Status: AC
  Administered 2019-06-10: 4000 mL via ORAL
  Filled 2019-06-10: qty 4000

## 2019-06-10 MED ORDER — POTASSIUM CHLORIDE 10 MEQ/100ML IV SOLN: 10.0000 meq | INTRAVENOUS | Status: DC

## 2019-06-10 MED ORDER — SALINE SPRAY 0.65 % NA SOLN
1.0000 | NASAL | Status: DC | PRN
  Administered 2019-06-10 – 2019-06-11 (×3): 1 via NASAL
  Filled 2019-06-10: qty 44
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
## 2019-06-10 NOTE — Progress Notes (Signed)
Bedside shift report received from [place], [place]. Morning assessment completed. Pt. Resting in bed, denies any pain or needs at this time. Call light is within reach. Will continue to monitor.
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
## 2019-06-11 ENCOUNTER — Ambulatory Visit: Admit: 2019-06-11 | Payer: BC Managed Care – PPO | Admitting: Gastroenterology

## 2019-06-11 ENCOUNTER — Encounter
Admission: EM | Disposition: A | Discharge status: Home / Self Care | Payer: Self-pay | Source: Home / Self Care | Attending: Internal Medicine

## 2019-06-11 ENCOUNTER — Inpatient Hospital Stay: Payer: BC Managed Care – PPO | Admitting: Anesthesiology

## 2019-06-11 DIAGNOSIS — K649 Unspecified hemorrhoids: Secondary | ICD-10-CM | POA: Diagnosis not present

## 2019-06-11 DIAGNOSIS — I2510 Atherosclerotic heart disease of native coronary artery without angina pectoris: Secondary | ICD-10-CM | POA: Diagnosis not present

## 2019-06-11 DIAGNOSIS — I110 Hypertensive heart disease with heart failure: Secondary | ICD-10-CM | POA: Diagnosis not present

## 2019-06-11 DIAGNOSIS — I509 Heart failure, unspecified: Secondary | ICD-10-CM | POA: Diagnosis not present

## 2019-06-11 HISTORY — PX: COLONOSCOPY: SHX5424

## 2019-06-11 LAB — CBC
HCT: 38.9 % — ABNORMAL LOW (ref 39.0–52.0)
Hemoglobin: 11.8 g/dL — ABNORMAL LOW (ref 13.0–17.0)
MCH: 23 pg — ABNORMAL LOW (ref 26.0–34.0)
MCHC: 30.3 g/dL (ref 30.0–36.0)
MCV: 75.7 fL — ABNORMAL LOW (ref 80.0–100.0)
Platelets: 365 10*3/uL (ref 150–400)
RBC: 5.14 MIL/uL (ref 4.22–5.81)
RDW: 16.8 % — ABNORMAL HIGH (ref 11.5–15.5)
WBC: 6.1 10*3/uL (ref 4.0–10.5)
nRBC: 0 % (ref 0.0–0.2)

## 2019-06-11 SURGERY — COLONOSCOPY
Anesthesia: General

## 2019-06-11 SURGERY — COLONOSCOPY WITH PROPOFOL
Anesthesia: General

## 2019-06-11 MED ORDER — DICLOFENAC SODIUM 1 % TD GEL: 2.0000 g | Freq: Four times a day (QID) | TRANSDERMAL | 1 refills | Status: DC | PRN

## 2019-06-11 MED ORDER — PROPOFOL 10 MG/ML IV BOLUS
INTRAVENOUS | Status: DC | PRN
  Administered 2019-06-11: 60 mg via INTRAVENOUS

## 2019-06-11 MED ORDER — PROPOFOL 500 MG/50ML IV EMUL
INTRAVENOUS | Status: DC | PRN
  Administered 2019-06-11: 175 ug/kg/min via INTRAVENOUS

## 2019-06-11 MED ORDER — PROPOFOL 10 MG/ML IV BOLUS
INTRAVENOUS | Status: AC
  Filled 2019-06-11: qty 40

## 2019-06-11 NOTE — Progress Notes (Signed)
Went over post procedure instructions with patient and spoke with wife in waiting room(with patient's permission). She will meet him back in his room.

## 2019-06-11 NOTE — Progress Notes (Signed)
cpap declined 

## 2019-06-11 NOTE — Transfer of Care (Signed)
Immediate Anesthesia Transfer of Care Note  Patient: Dale Kennedy  Procedure(s) Performed: COLONOSCOPY (N/A )  Patient Location: Endoscopy Unit  Anesthesia Type:MAC  Level of Consciousness: awake, alert  and oriented  Airway & Oxygen Therapy: Patient Spontanous Breathing and Patient connected to nasal cannula oxygen  Post-op Assessment: Report given to RN and Post -op Vital signs reviewed and stable  Post vital signs: Reviewed and stable  Last Vitals:  Vitals Value Taken Time  BP    Temp    Pulse    Resp    SpO2      Last Pain:  Vitals:   06/11/19 0815  TempSrc:   PainSc: 2          Complications: No apparent anesthesia complications

## 2019-06-11 NOTE — Progress Notes (Addendum)
DISCHARGE NOTE:  Pt given discharge instructions, pt verbalized understanding. Pt wheeled to car by staff. Wife providing transportation.
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
## 2019-06-12 ENCOUNTER — Encounter: Payer: Self-pay | Admitting: *Deleted

## 2019-06-12 MED FILL — REPATHA SURECLICK 140 MG/ML SUBCUTANEOUS PEN INJECTOR: SUBCUTANEOUS | 28 days supply | Qty: 2 | Fill #1

## 2019-06-12 MED FILL — REPATHA SURECLICK 140 MG/ML SUBCUTANEOUS PEN INJECTOR: 28 days supply | Qty: 2 | Fill #1 | Status: AC

## 2019-06-12 NOTE — Discharge Summary (Signed)
Dale Kennedy [id number] DOB: [birth year] DOA: 06/09/2019  PCP: Delsa Grana, PA-C  Admit date: 06/09/2019  Discharge date: 06/12/2019  Admitted From: Home   disposition: Home   Recommendations for Outpatient Follow-up:   Follow-up with Dr. Vicente Males in 1 to 2 days for banding of the hemorrhoids as an outpatient.  Home Health: None Equipment/Devices: None Consultations: GI Discharge Condition: Stable CODE STATUS: Full Diet Recommendation: Heart Healthy   Diet Order            Diet - low sodium heart healthy               Chief Complaint  Patient presents with  . Rectal Bleeding     Brief history of present illness from the day of admission and additional interim summary    Dale Kennedy is a 44 y.o. male with medical history significant for coronary artery disease status post stent angioplasty, hypertension, GERD and CHF.  Patient presented to the emergency room for evaluation of passage of bright red blood per rectum.  He admits to having constipation for the last few days with straining while having a bowel movement and noticed that he had bright red blood that filled the commode.  He has had 2 more episodes after the initial event this morning.  He complains of feeling dizzy and lightheaded.  He denies having any chest pain, shortness of breath, nausea or vomiting.  Denies having any abdominal pain.  He had a colonoscopy that was done in 2018 which showed ulceration of the terminal ileum and internal hemorrhoids.  He had a recent upper endoscopy which showed duodenitis.  Due to the frequency of passage of bright red blood per rectum and the quantity of blood and symptoms of dizziness, patient will be referred to observation status for further evaluation      Hospital Course   Patient had another 2 episodes of passing dark red clots per rectum.  He remained hemodynamically stable with no episodes of tachycardia or hypotension.  H&H also remained stable without drop.  Patient was seen by Dr. Vicente Males the following morning and underwent a colonoscopy which showed large grade 1 nonbleeding hemorrhoids.  The rest of the colon was without any abnormalities.  It was determined that the bleeding was most likely.  44 year old hemodynamically stable man with recurrent hematochezia.  Acute lower GI bleed H&H have remained essentially stable and normal Patient is hemodynamically stable Colonoscopy in 2018 revealed ulceration at the terminal ileum and some internal hemorrhoids. Colonoscopy done here revealed large internal grade 1 hemorrhoids as noted above. GI bleed thought to be secondary to hemorrhoidal bleeding Patient to follow-up with Dr. Vicente Males as an outpatient for banding.  This was discussed with patient by Dr. Vicente Males.  Hypokalemia Repleted and normalized  CAD Patient denies chest pain or other signs of acute coronary syndrome Continue home medication  HTN Chlorthalidone has been continued  Discharge diagnosis     Principal Problem:   Rectal bleeding Active Problems:   Essential hypertension   Chronic  pain of multiple sites   Acute lower GI bleeding    Discharge instructions    Discharge Instructions    Diet - low sodium heart healthy   Complete by: As directed    Discharge instructions   Complete by: As directed    1.  Do not take any aspirin until after you see Dr. Vicente Males in the office.  You can ask him then when you can start taking your aspirin again. 2.  You will need to see Dr. Vicente Males in a couple of days as he discussed with you to get your hemorrhoids banded. 3.  Do not take any Advil or ibuprofen or any other medications that thin your blood until after you see Dr. Vicente Males. 4.  Try not to strain when you go to the bathroom, you  can use MiraLAX or Metamucil to keep your stool soft.   Increase activity slowly   Complete by: As directed       Discharge Medications   Allergies as of 06/11/2019      Reactions   Ace Inhibitors Swelling      Medication List    STOP taking these medications   aspirin 81 MG tablet     TAKE these medications   albuterol 108 (90 Base) MCG/ACT inhaler Commonly known as: VENTOLIN HFA Inhale 2 puffs into the lungs every 6 (six) hours as needed for wheezing or shortness of breath.   budesonide-formoterol 160-4.5 MCG/ACT inhaler Commonly known as: SYMBICORT Inhale 2 puffs into the lungs 2 (two) times daily.   chlorthalidone 25 MG tablet Commonly known as: HYGROTON Take 25 mg by mouth daily.   Dexilant 60 MG capsule Generic drug: dexlansoprazole Take 1 capsule (60 mg total) by mouth daily.   diclofenac sodium 1 % Gel Commonly known as: VOLTAREN Apply 2 g topically 4 (four) times daily as needed (for chronic back and joint pain).   HYDROcodone-acetaminophen 10-325 MG tablet Commonly known as: NORCO Take 0.5-1 tablets by mouth 2 (two) times daily as needed for severe pain.   loratadine 10 MG tablet Commonly known as: CLARITIN TAKE 1 TABLET BY MOUTH ONCE DAILY AS NEEDED FOR ALLERGIES What changed: See the new instructions.   nitroGLYCERIN 0.4 MG SL tablet Commonly known as: NITROSTAT Place 1 tablet under the tongue as needed.   Repatha SureClick [id number] MG/ML Soaj Generic drug: Evolocumab Inject into the skin every 14 (fourteen) days.   sertraline 25 MG tablet Commonly known as: ZOLOFT Take 1 tablet (25 mg total) by mouth daily.         Major procedures and Radiology Reports - PLEASE review detailed and final reports thoroughly  -        DG Chest 2 View  Result Date: 05/25/2019 CLINICAL DATA:  Right lower lobe crackles. Shortness of breath. COVID positive in December. EXAM: CHEST - 2 VIEW COMPARISON:  Radiograph 02/09/2019, CT 08/30/2018 FINDINGS: The  cardiomediastinal contours are normal. The lungs are clear. Pulmonary vasculature is normal. No consolidation, pleural effusion, or pneumothorax. No acute osseous abnormalities are seen. IMPRESSION: Unremarkable radiographs of the chest. Electronically Signed   By: Keith Rake M.D.   On: 05/25/2019 02:45    Micro Results    Recent Results (from the past 240 hour(s))  Respiratory Panel by RT PCR (Flu A&B, Covid) - Nasopharyngeal Swab     Status: None   Collection Time: 06/09/19 12:24 PM   Specimen: Nasopharyngeal Swab  Result Value Ref Range Status   SARS Coronavirus 2 by RT  PCR NEGATIVE NEGATIVE Final    Comment: (NOTE) SARS-CoV-2 target nucleic acids are NOT DETECTED. The SARS-CoV-2 RNA is generally detectable in upper respiratoy specimens during the acute phase of infection. The lowest concentration of SARS-CoV-2 viral copies this assay can detect is 131 copies/mL. A negative result does not preclude SARS-Cov-2 infection and should not be used as the sole basis for treatment or other patient management decisions. A negative result may occur with  improper specimen collection/handling, submission of specimen other than nasopharyngeal swab, presence of viral mutation(s) within the areas targeted by this assay, and inadequate number of viral copies (<131 copies/mL). A negative result must be combined with clinical observations, patient history, and epidemiological information. The expected result is Negative. Fact Sheet for Patients:  PinkCheek.be Fact Sheet for Healthcare Providers:  GravelBags.it This test is not yet ap proved or cleared by the [place] FDA and  has been authorized for detection and/or diagnosis of SARS-CoV-2 by FDA under an Emergency Use Authorization (EUA). This EUA will remain  in effect (meaning this test can be used) for the duration of the COVID-19 declaration under Section 564(b)(1) of the  Act, 21 U.S.C. section 360bbb-3(b)(1), unless the authorization is terminated or revoked sooner.    Influenza A by PCR NEGATIVE NEGATIVE Final   Influenza B by PCR NEGATIVE NEGATIVE Final    Comment: (NOTE) The Xpert Xpress SARS-CoV-2/FLU/RSV assay is intended as an aid in  the diagnosis of influenza from Nasopharyngeal swab specimens and  should not be used as a sole basis for treatment. Nasal washings and  aspirates are unacceptable for Xpert Xpress SARS-CoV-2/FLU/RSV  testing. Fact Sheet for Patients: PinkCheek.be Fact Sheet for Healthcare Providers: GravelBags.it This test is not yet approved or cleared by the [place] FDA and  has been authorized for detection and/or diagnosis of SARS-CoV-2 by  FDA under an Emergency Use Authorization (EUA). This EUA will remain  in effect (meaning this test can be used) for the duration of the  Covid-19 declaration under Section 564(b)(1) of the Act, 21  U.S.C. section 360bbb-3(b)(1), unless the authorization is  terminated or revoked. Performed at [facility], [address] [place], [place] [address]     Today   Subjective    Dale Kennedy understands why he blood and is comfortable going home.  He has no headache, dizziness, syncope or presyncope.  No chest pain or shortness of breath.  Patient understands he is to follow-up with Dr. Vicente Males in a couple days for banding.   Objective   Blood pressure 120/83, pulse 65, temperature 97.7 F (36.5 C), temperature source Oral, resp. rate 18, height 6' (1.829 m), weight 107.5 kg, SpO2 99 %.   Intake/Output Summary (Last 24 hours) at 06/12/2019 1333 Last data filed at 06/11/2019 1334 Gross per 24 hour  Intake 200 ml  Output --  Net 200 ml    Exam Awake Alert, Oriented x 3, No new F.N deficits, Normal affect [place].AT,PERRAL Supple Neck,No JVD, No cervical lymphadenopathy appriciated.  Symmetrical Chest wall movement, Good  air movement bilaterally, CTAB RRR,No Gallops,Rubs or new Murmurs, No Parasternal Heave +ve B.Sounds, Abd Soft, Non tender, No organomegaly appriciated, No rebound -guarding or rigidity. No Cyanosis, Clubbing or edema, No new Rash or bruise   Data Review   CBC w Diff:  Lab Results  Component Value Date   WBC 6.1 06/11/2019   HGB 11.8 (L) 06/11/2019   HGB 12.6 (L) 05/25/2018   HCT 38.9 (L) 06/11/2019   HCT  41.6 05/25/2018   PLT 365 06/11/2019   PLT 371 05/25/2018   LYMPHOPCT 18 06/09/2019   MONOPCT 10 06/09/2019   EOSPCT 1 06/09/2019   BASOPCT 1 06/09/2019    CMP:  Lab Results  Component Value Date   NA 136 06/10/2019   NA 140 05/18/2018   NA 136 07/22/2013   K 4.3 06/10/2019   K 3.9 07/22/2013   CL 103 06/10/2019   CL 101 07/22/2013   CO2 23 06/10/2019   CO2 28 07/22/2013   BUN 18 06/10/2019   BUN 12 05/18/2018   BUN 12 07/22/2013   CREATININE 1.09 06/10/2019   CREATININE 1.10 10/14/2016   PROT 6.6 06/09/2019   PROT 8.2 05/18/2018   PROT 9.3 (H) 07/22/2013   ALBUMIN 2.8 (L) 06/09/2019   ALBUMIN 4.6 05/18/2018   ALBUMIN 3.6 07/22/2013   BILITOT 0.6 06/09/2019   BILITOT 0.3 05/18/2018   BILITOT 0.3 07/22/2013   ALKPHOS 83 06/09/2019   ALKPHOS 148 (H) 07/22/2013   AST 17 06/09/2019   AST 17 07/22/2013   ALT 19 06/09/2019   ALT 51 07/22/2013  .   Total Time in preparing paper work, data evaluation and todays exam - 35 minutes  Vashti Hey M.D on 06/12/2019 at 1:33 PM  [facility]   Office  [phone]

## 2019-06-12 NOTE — Op Note (Signed)
[facility] Gastroenterology Patient Name: Dale Kennedy Procedure Date: 06/11/2019 8:56 AM MRN: [id number] Account #: [id number] Date of Birth: [birth year] Admit Type: Outpatient Age: 44 Room: [facility] ENDO ROOM 1 Gender: Male Note Status: Finalized Procedure:             Colonoscopy Indications:           Rectal bleeding Providers:             [name] MD, MD Medicines:             Monitored Anesthesia Care Complications:         No immediate complications. Procedure:             Pre-Anesthesia Assessment:                        - Prior to the procedure, a History and Physical was                         performed, and patient medications, allergies and                         sensitivities were reviewed. The patient's tolerance                         of previous anesthesia was reviewed.                        - The risks and benefits of the procedure and the                         sedation options and risks were discussed with the                         patient. All questions were answered and informed                         consent was obtained.                        - ASA Grade Assessment: III - A patient with severe                         systemic disease.                        After obtaining informed consent, the colonoscope was                         passed under direct vision. Throughout the procedure,                         the patient's blood pressure, pulse, and oxygen                         saturations were monitored continuously. The                         Colonoscope was introduced through the anus and  advanced to the the terminal ileum. The colonoscopy                         was performed with ease. The patient tolerated the                         procedure well. The quality of the bowel preparation                         was excellent. Findings:      The perianal and digital rectal examinations were normal.       Non-bleeding internal hemorrhoids were found during retroflexion. The       hemorrhoids were large and Grade I (internal hemorrhoids that do not       prolapse).      The exam was otherwise without abnormality on direct and retroflexion       views. Impression:            - Non-bleeding internal hemorrhoids.                        - The examination was otherwise normal on direct and                         retroflexion views.                        - No specimens collected. Recommendation:        - Return patient to hospital ward for ongoing care.                        - Advance diet as tolerated.                        - 1. Monitor next 6 hours if stable and no bleeding                         then home. No blood seen in termial ileum or colon                        2. Likely source of bleeding from hemorroids                        3. Higth fiber diet                        4. Miralax 1 cap daily BID                        5. Return to my office for banding of hemorroids Procedure Code(s):     --- Professional ---                        [phone], Colonoscopy, flexible; diagnostic, including                         collection of specimen(s) by brushing or washing, when                         performed (separate procedure)  Diagnosis Code(s):     --- Professional ---                        K64.0, First degree hemorrhoids                        K62.5, Hemorrhage of anus and rectum CPT copyright 2019 American Medical Association. All rights reserved. The codes documented in this report are preliminary and upon coder review may  be revised to meet current compliance requirements. [name], MD [name] MD, MD 06/11/2019 9:14:57 AM This report has been signed electronically. Number of Addenda: 0 Note Initiated On: 06/11/2019 8:56 AM Scope Withdrawal Time: 0 hours 6 minutes 59 seconds  Total Procedure Duration: 0 hours 8 minutes 52 seconds  Estimated Blood Loss:  Estimated blood loss:  none.      [facility]

## 2019-06-13 ENCOUNTER — Other Ambulatory Visit: Payer: Self-pay

## 2019-06-13 ENCOUNTER — Encounter: Payer: Self-pay | Admitting: Gastroenterology

## 2019-06-13 ENCOUNTER — Ambulatory Visit (INDEPENDENT_AMBULATORY_CARE_PROVIDER_SITE_OTHER): Payer: BC Managed Care – PPO | Admitting: Gastroenterology

## 2019-06-13 VITALS — BP 130/81 | HR 74 | Temp 97.6°F | Ht 74.0 in | Wt 236.0 lb

## 2019-06-13 DIAGNOSIS — K648 Other hemorrhoids: Secondary | ICD-10-CM

## 2019-06-13 NOTE — Progress Notes (Signed)
[name] MD, MRCP(U.K) [address]  [place]  [place], [place] [address]  Main: [phone]  Fax: [phone]   Primary Care Physician: Delsa Grana, PA-C  Primary Gastroenterologist:  Dr. [name]   Chief Complaint  Patient presents with  . Hemorrhoids    Banding #1    HPI: Dale Kennedy is a 45 y.o. male   Admitted on 06/09/2019 with rectal bleeding.  Performed a colonoscopy on 06/11/2019 which demonstrated large internal hemorrhoids that were nonbleeding.  He is here today to discuss about hemorrhoidal banding.  Rectal exam performed with chaperone.  No masses felt no blood in the glove felt no palpable masses felt PROCEDURE NOTE: The patient presents with symptomatic grade 1 hemorrhoids, unresponsive to maximal medical therapy, requesting rubber band ligation of his/her hemorrhoidal disease.  All risks, benefits and alternative forms of therapy were described and informed consent was obtained.  In the Left Lateral Decubitus position (if anoscopy is performed) anoscopic examination revealed grade 2 hemorrhoids in the all position(s).   The decision was made to band the RA internal hemorrhoid, and the Payne was used to perform band ligation without complication.  Digital anorectal examination was then performed to assure proper positioning of the band, and to adjust the banded tissue as required.  The patient was discharged home without pain or other issues.  Dietary and behavioral recommendations were given and (if necessary - prescriptions were given), along with follow-up instructions.  The patient will return 3 weeks for follow-up and possible additional banding as required.  No complications were encountered and the patient tolerated the procedure well.     Current Outpatient Medications  Medication Sig Dispense Refill  . albuterol (VENTOLIN HFA) 108 (90 Base) MCG/ACT inhaler Inhale 2 puffs into the lungs every 6 (six) hours as needed for wheezing  or shortness of breath. 18 g 2  . budesonide-formoterol (SYMBICORT) 160-4.5 MCG/ACT inhaler Inhale 2 puffs into the lungs 2 (two) times daily. 1 Inhaler 3  . chlorthalidone (HYGROTON) 25 MG tablet Take 25 mg by mouth daily.     Marland Kitchen dexlansoprazole (DEXILANT) 60 MG capsule Take 1 capsule (60 mg total) by mouth daily. 90 capsule 3  . diclofenac sodium (VOLTAREN) 1 % GEL Apply 2 g topically 4 (four) times daily as needed (for chronic back and joint pain). 100 g 1  . Evolocumab (REPATHA SURECLICK) [id number] MG/ML SOAJ Inject into the skin every 14 (fourteen) days.     Marland Kitchen HYDROcodone-acetaminophen (NORCO) 10-325 MG tablet Take 0.5-1 tablets by mouth 2 (two) times daily as needed for severe pain. 45 tablet 0  . loratadine (CLARITIN) 10 MG tablet TAKE 1 TABLET BY MOUTH ONCE DAILY AS NEEDED FOR ALLERGIES (Patient taking differently: Take 10 mg by mouth daily as needed for allergies. ) 30 tablet 11  . nitroGLYCERIN (NITROSTAT) 0.4 MG SL tablet Place 1 tablet under the tongue as needed.  2  . sertraline (ZOLOFT) 25 MG tablet Take 1 tablet (25 mg total) by mouth daily. 90 tablet 3   No current facility-administered medications for this visit.    Allergies as of 06/13/2019 - Review Complete 06/13/2019  Allergen Reaction Noted  . Ace inhibitors Swelling 12/24/2016    ROS:  General: Negative for anorexia, weight loss, fever, chills, fatigue, weakness. ENT: Negative for hoarseness, difficulty swallowing , nasal congestion. CV: Negative for chest pain, angina, palpitations, dyspnea on exertion, peripheral edema.  Respiratory: Negative for dyspnea at rest, dyspnea on exertion, cough, sputum, wheezing.  GI: See history of present illness. GU:  Negative for dysuria, hematuria, urinary incontinence, urinary frequency, nocturnal urination.  Endo: Negative for unusual weight change.    Physical Examination:   BP 130/81   Pulse 74   Temp 97.6 F (36.4 C)   Ht 6\' 2"  (1.88 m)   Wt 236 lb (107 kg)   BMI 30.30  kg/m   General: Well-nourished, well-developed in no acute distress.  Psych: Alert and cooperative, normal mood and affect.   Imaging Studies: DG Chest 2 View  Result Date: 05/25/2019 CLINICAL DATA:  Right lower lobe crackles. Shortness of breath. COVID positive in December. EXAM: CHEST - 2 VIEW COMPARISON:  Radiograph 02/09/2019, CT 08/30/2018 FINDINGS: The cardiomediastinal contours are normal. The lungs are clear. Pulmonary vasculature is normal. No consolidation, pleural effusion, or pneumothorax. No acute osseous abnormalities are seen. IMPRESSION: Unremarkable radiographs of the chest. Electronically Signed   By: Keith Rake M.D.   On: 05/25/2019 02:45    Assessment and Plan:   Dale Kennedy is a 44 y.o. y/o male here today as a follow-up to his recent hospitalization for rectal bleeding and was found to have large internal hemorrhoids on colonoscopy.  Today return to have his hemorrhoids banded.  Right anterior hemorrhoid was banded.  He will return in 3 weeks to have his right posterior hemorrhoid and left lateral hemorrhoid subsequently banded.  He had no pain or discomfort at the end of the procedure.    Dr [name]  MD,MRCP [facility]) Follow up in 3 weeks

## 2019-06-15 NOTE — Anesthesia Postprocedure Evaluation (Signed)
Anesthesia Post Note  Patient: Dale Kennedy  Procedure(s) Performed: COLONOSCOPY (N/A )  Patient location during evaluation: Endoscopy Anesthesia Type: General Level of consciousness: awake and alert Pain management: pain level controlled Vital Signs Assessment: post-procedure vital signs reviewed and stable Respiratory status: spontaneous breathing, nonlabored ventilation and respiratory function stable Cardiovascular status: blood pressure returned to baseline and stable Postop Assessment: no apparent nausea or vomiting Anesthetic complications: no     Last Vitals:  Vitals:   06/11/19 0934 06/11/19 0951  BP: 113/81 120/83  Pulse: (!) 59 65  Resp: 16 18  Temp:  36.5 C  SpO2: 100% 99%    Last Pain:  Vitals:   06/11/19 0951  TempSrc: Oral  PainSc: 0-No pain                 Alphonsus Sias

## 2019-06-20 DIAGNOSIS — G4733 Obstructive sleep apnea (adult) (pediatric): Secondary | ICD-10-CM | POA: Diagnosis not present

## 2019-06-21 ENCOUNTER — Other Ambulatory Visit: Payer: Self-pay

## 2019-06-21 ENCOUNTER — Ambulatory Visit: Payer: BC Managed Care – PPO | Admitting: Family Medicine

## 2019-06-21 ENCOUNTER — Encounter: Payer: Self-pay | Admitting: Family Medicine

## 2019-06-21 ENCOUNTER — Telehealth: Payer: Self-pay | Admitting: Family Medicine

## 2019-06-21 VITALS — BP 116/80 | HR 87 | Temp 98.1°F | Resp 14 | Ht 74.0 in | Wt 236.2 lb

## 2019-06-21 DIAGNOSIS — Z79891 Long term (current) use of opiate analgesic: Secondary | ICD-10-CM

## 2019-06-21 DIAGNOSIS — R52 Pain, unspecified: Secondary | ICD-10-CM

## 2019-06-21 DIAGNOSIS — K922 Gastrointestinal hemorrhage, unspecified: Secondary | ICD-10-CM | POA: Diagnosis not present

## 2019-06-21 DIAGNOSIS — E876 Hypokalemia: Secondary | ICD-10-CM

## 2019-06-21 DIAGNOSIS — K625 Hemorrhage of anus and rectum: Secondary | ICD-10-CM

## 2019-06-21 DIAGNOSIS — R7989 Other specified abnormal findings of blood chemistry: Secondary | ICD-10-CM | POA: Diagnosis not present

## 2019-06-21 DIAGNOSIS — E349 Endocrine disorder, unspecified: Secondary | ICD-10-CM | POA: Diagnosis not present

## 2019-06-21 DIAGNOSIS — E782 Mixed hyperlipidemia: Secondary | ICD-10-CM

## 2019-06-21 DIAGNOSIS — G8929 Other chronic pain: Secondary | ICD-10-CM

## 2019-06-21 DIAGNOSIS — Z09 Encounter for follow-up examination after completed treatment for conditions other than malignant neoplasm: Secondary | ICD-10-CM

## 2019-06-21 DIAGNOSIS — D649 Anemia, unspecified: Secondary | ICD-10-CM

## 2019-06-21 DIAGNOSIS — M79605 Pain in left leg: Secondary | ICD-10-CM

## 2019-06-21 DIAGNOSIS — T07XXXA Unspecified multiple injuries, initial encounter: Secondary | ICD-10-CM

## 2019-06-21 DIAGNOSIS — Z5181 Encounter for therapeutic drug level monitoring: Secondary | ICD-10-CM

## 2019-06-21 DIAGNOSIS — Z79899 Other long term (current) drug therapy: Secondary | ICD-10-CM

## 2019-06-21 DIAGNOSIS — M25552 Pain in left hip: Secondary | ICD-10-CM

## 2019-06-21 DIAGNOSIS — M79604 Pain in right leg: Secondary | ICD-10-CM

## 2019-06-21 MED ORDER — PREGABALIN 25 MG PO CAPS: 25.0000 mg | ORAL_CAPSULE | Freq: Two times a day (BID) | ORAL | 3 refills | Status: DC | PRN

## 2019-06-21 MED ORDER — EZETIMIBE 10 MG PO TABS: 10.0000 mg | ORAL_TABLET | Freq: Every day | ORAL | 11 refills | Status: DC

## 2019-06-21 MED ORDER — HYDROCODONE-ACETAMINOPHEN 10-325 MG PO TABS: 0.5000 | ORAL_TABLET | Freq: Two times a day (BID) | ORAL | 0 refills | Status: DC | PRN
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
## 2019-06-21 NOTE — Telephone Encounter (Signed)
Med refills are 3 day turn around. He did ask at the end of his appt but I am seeing patients and haven't gotten back to do more for him on his chart. Should be there by tomorrow as long as no difficulty with controlled substance prescribing. By law I have to take several steps and document them prior to refilling narcotics.  Kristeen Miss

## 2019-06-21 NOTE — Telephone Encounter (Signed)
Copied from [place] [id number]. Topic: General - Other >> Jun 21, 2019 12:27 PM Keene Breath wrote: Reason for CRM: Patient called to check the status of his medication refills.  He stated he just left the doctor and she said she would call in his HYDROcodone-acetaminophen (NORCO) 10-325 MG tablet and a muscle relaxer.  The pharmacy did not get a script as of yet.  Please advise and call patient to let him know when he can get the medications.  CB# [phone]

## 2019-06-22 ENCOUNTER — Other Ambulatory Visit: Payer: Self-pay | Admitting: Family Medicine

## 2019-06-22 LAB — CBC WITH DIFFERENTIAL/PLATELET
Absolute Monocytes: 594 cells/uL (ref 200–950)
Basophils Absolute: 50 cells/uL (ref 0–200)
Basophils Relative: 0.9 %
Eosinophils Absolute: 78 cells/uL (ref 15–500)
Eosinophils Relative: 1.4 %
HCT: 40.5 % (ref 38.5–50.0)
Hemoglobin: 12.3 g/dL — ABNORMAL LOW (ref 13.2–17.1)
Lymphs Abs: 1322 cells/uL (ref 850–3900)
MCH: 22.9 pg — ABNORMAL LOW (ref 27.0–33.0)
MCHC: 30.4 g/dL — ABNORMAL LOW (ref 32.0–36.0)
MCV: 75.3 fL — ABNORMAL LOW (ref 80.0–100.0)
MPV: 9.3 fL (ref 7.5–12.5)
Monocytes Relative: 10.6 %
Neutro Abs: 3556 cells/uL (ref 1500–7800)
Neutrophils Relative %: 63.5 %
Platelets: 444 10*3/uL — ABNORMAL HIGH (ref 140–400)
RBC: 5.38 10*6/uL (ref 4.20–5.80)
RDW: 15.6 % — ABNORMAL HIGH (ref 11.0–15.0)
Total Lymphocyte: 23.6 %
WBC: 5.6 10*3/uL (ref 3.8–10.8)

## 2019-06-22 LAB — LIPID PANEL
Cholesterol: 159 mg/dL (ref <200)
HDL: 36 mg/dL — ABNORMAL LOW (ref >=40)
LDL Cholesterol (Calc): 104 mg/dL (calc) — ABNORMAL HIGH
Non-HDL Cholesterol (Calc): 123 mg/dL (calc) (ref <130)
Total CHOL/HDL Ratio: 4.4 (calc) (ref <5.0)
Triglycerides: 91 mg/dL (ref <150)

## 2019-06-22 LAB — COMPLETE METABOLIC PANEL WITH GFR
AG Ratio: 1 (calc) (ref 1.0–2.5)
ALT: 15 U/L (ref 9–46)
AST: 18 U/L (ref 10–40)
Albumin: 4.2 g/dL (ref 3.6–5.1)
Alkaline phosphatase (APISO): 114 U/L (ref 36–130)
BUN: 19 mg/dL (ref 7–25)
CO2: 30 mmol/L (ref 20–32)
Calcium: 9.7 mg/dL (ref 8.6–10.3)
Chloride: 101 mmol/L (ref 98–110)
Creat: 1.24 mg/dL (ref 0.60–1.35)
GFR, Est African American: 83 mL/min/{1.73_m2} (ref >=60)
GFR, Est Non African American: 71 mL/min/{1.73_m2} (ref >=60)
Globulin: 4.2 g/dL (calc) — ABNORMAL HIGH (ref 1.9–3.7)
Glucose, Bld: 92 mg/dL (ref 65–99)
Potassium: 4.4 mmol/L (ref 3.5–5.3)
Sodium: 136 mmol/L (ref 135–146)
Total Bilirubin: 0.3 mg/dL (ref 0.2–1.2)
Total Protein: 8.4 g/dL — ABNORMAL HIGH (ref 6.1–8.1)

## 2019-06-22 LAB — VITAMIN D 25 HYDROXY (VIT D DEFICIENCY, FRACTURES): Vit D, 25-Hydroxy: 20 ng/mL — ABNORMAL LOW (ref 30–100)

## 2019-06-22 LAB — PARATHYROID HORMONE, INTACT (NO CA): PTH: 54 pg/mL (ref 14–64)

## 2019-06-22 MED ORDER — VITAMIN D (ERGOCALCIFEROL) 1.25 MG (50000 UNIT) PO CAPS: 50000.0000 [IU] | ORAL_CAPSULE | ORAL | 0 refills | Status: DC

## 2019-06-29 MED ORDER — CHLORTHALIDONE 25 MG TABLET
ORAL_TABLET | 6 refills | 0 days | Status: CP
Start: 2019-06-29 — End: ?

## 2019-06-30 ENCOUNTER — Encounter
Admit: 2019-06-30 | Discharge: 2019-06-30 | Disposition: A | Discharge status: Home / Self Care | Payer: BLUE CROSS/BLUE SHIELD

## 2019-06-30 ENCOUNTER — Emergency Department
Admit: 2019-06-30 | Discharge: 2019-06-30 | Disposition: A | Discharge status: Home / Self Care | Payer: BLUE CROSS/BLUE SHIELD

## 2019-06-30 DIAGNOSIS — R079 Chest pain, unspecified: Principal | ICD-10-CM

## 2019-06-30 DIAGNOSIS — E785 Hyperlipidemia, unspecified: Secondary | ICD-10-CM | POA: Diagnosis not present

## 2019-06-30 DIAGNOSIS — Z87891 Personal history of nicotine dependence: Secondary | ICD-10-CM | POA: Diagnosis not present

## 2019-06-30 DIAGNOSIS — Z7951 Long term (current) use of inhaled steroids: Secondary | ICD-10-CM | POA: Diagnosis not present

## 2019-06-30 DIAGNOSIS — M542 Cervicalgia: Secondary | ICD-10-CM | POA: Diagnosis not present

## 2019-06-30 DIAGNOSIS — M25512 Pain in left shoulder: Secondary | ICD-10-CM | POA: Diagnosis not present

## 2019-06-30 DIAGNOSIS — I252 Old myocardial infarction: Secondary | ICD-10-CM | POA: Diagnosis not present

## 2019-06-30 DIAGNOSIS — R0789 Other chest pain: Secondary | ICD-10-CM | POA: Diagnosis not present

## 2019-06-30 DIAGNOSIS — R110 Nausea: Secondary | ICD-10-CM | POA: Diagnosis not present

## 2019-06-30 DIAGNOSIS — J45909 Unspecified asthma, uncomplicated: Secondary | ICD-10-CM | POA: Diagnosis not present

## 2019-06-30 DIAGNOSIS — Z8616 Personal history of COVID-19: Secondary | ICD-10-CM | POA: Diagnosis not present

## 2019-06-30 DIAGNOSIS — I119 Hypertensive heart disease without heart failure: Secondary | ICD-10-CM | POA: Diagnosis not present

## 2019-06-30 DIAGNOSIS — Z7982 Long term (current) use of aspirin: Secondary | ICD-10-CM | POA: Diagnosis not present

## 2019-06-30 DIAGNOSIS — K219 Gastro-esophageal reflux disease without esophagitis: Secondary | ICD-10-CM | POA: Diagnosis not present

## 2019-06-30 DIAGNOSIS — R0602 Shortness of breath: Secondary | ICD-10-CM | POA: Diagnosis not present

## 2019-06-30 DIAGNOSIS — I2510 Atherosclerotic heart disease of native coronary artery without angina pectoris: Secondary | ICD-10-CM | POA: Diagnosis not present

## 2019-06-30 DIAGNOSIS — I4891 Unspecified atrial fibrillation: Secondary | ICD-10-CM | POA: Diagnosis not present

## 2019-06-30 LAB — CBC W/ AUTO DIFF
BASOPHILS ABSOLUTE COUNT: 0.1 10*9/L (ref 0.0–0.1)
BASOPHILS RELATIVE PERCENT: 1.3 %
EOSINOPHILS ABSOLUTE COUNT: 0.1 10*9/L (ref 0.0–0.7)
HEMATOCRIT: 39 % (ref 38.0–50.0)
HEMOGLOBIN: 12.4 g/dL — ABNORMAL LOW (ref 13.5–17.5)
LYMPHOCYTES ABSOLUTE COUNT: 1.5 10*9/L (ref 0.7–4.0)
LYMPHOCYTES RELATIVE PERCENT: 22.4 %
MEAN CORPUSCULAR HEMOGLOBIN CONC: 31.9 g/dL (ref 30.0–36.0)
MEAN CORPUSCULAR HEMOGLOBIN: 22.8 pg — ABNORMAL LOW (ref 26.0–34.0)
MEAN CORPUSCULAR VOLUME: 71.4 fL — ABNORMAL LOW (ref 81.0–95.0)
MONOCYTES RELATIVE PERCENT: 10.8 %
NEUTROPHILS ABSOLUTE COUNT: 4.3 10*9/L (ref 1.7–7.7)
NEUTROPHILS RELATIVE PERCENT: 64.4 %
NUCLEATED RED BLOOD CELLS: 0 /100{WBCs} (ref <=4)
PLATELET COUNT: 447 10*9/L (ref 150–450)
RED BLOOD CELL COUNT: 5.47 10*12/L (ref 4.32–5.72)
RED CELL DISTRIBUTION WIDTH: 16.7 % — ABNORMAL HIGH (ref 12.0–15.0)
WBC ADJUSTED: 6.7 10*9/L (ref 3.5–10.5)

## 2019-06-30 LAB — COMPREHENSIVE METABOLIC PANEL
ALBUMIN: 4.7 g/dL (ref 3.5–5.0)
ALKALINE PHOSPHATASE: 138 U/L — ABNORMAL HIGH (ref 38–126)
ALT (SGPT): 27 U/L (ref <50)
ANION GAP: 14 mmol/L (ref 7–15)
AST (SGOT): 29 U/L (ref 19–55)
BILIRUBIN TOTAL: 0.4 mg/dL (ref 0.0–1.2)
BLOOD UREA NITROGEN: 19 mg/dL (ref 7–21)
BUN / CREAT RATIO: 16
CALCIUM: 9.6 mg/dL (ref 8.5–10.2)
CO2: 25 mmol/L (ref 22.0–30.0)
CREATININE: 1.22 mg/dL (ref 0.70–1.30)
EGFR CKD-EPI AA MALE: 84 mL/min/{1.73_m2} (ref >=60)
EGFR CKD-EPI NON-AA MALE: 73 mL/min/{1.73_m2} (ref >=60)
GLUCOSE RANDOM: 101 mg/dL (ref 70–179)
POTASSIUM: 4.3 mmol/L (ref 3.5–5.0)
PROTEIN TOTAL: 9.1 g/dL — ABNORMAL HIGH (ref 6.5–8.3)
SODIUM: 140 mmol/L (ref 135–145)

## 2019-06-30 LAB — ALKALINE PHOSPHATASE: Alkaline phosphatase:CCnc:Pt:Ser/Plas:Qn:: 138 — ABNORMAL HIGH

## 2019-06-30 LAB — BASOPHILS ABSOLUTE COUNT: Basophils:NCnc:Pt:Bld:Qn:Automated count: 0.1

## 2019-06-30 LAB — TROPONIN I
Troponin I.cardiac:MCnc:Pt:Ser/Plas:Qn:: <0.06
Troponin I.cardiac:MCnc:Pt:Ser/Plas:Qn:: <0.06

## 2019-06-30 LAB — MAGNESIUM: Magnesium:MCnc:Pt:Ser/Plas:Qn:: 2.1

## 2019-06-30 LAB — D-DIMER QUANTITATIVE (CH,ML,PD,ET): Fibrin D-dimer DDU:MCnc:Pt:PPP:Qn:: 154

## 2019-06-30 NOTE — Unmapped (Signed)
Pt CP, shoulder pain, and back pain that has been intermittent for a couple days. Pt stating it feels like a lightning bolt. Pt stating sharp pain shooting from neck to left arm and then CP. CP is dull and left anterior. Pt stating he was still having the pain this morning so his wife told him to come here.
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
## 2019-07-01 ENCOUNTER — Ambulatory Visit: Payer: BC Managed Care – PPO

## 2019-07-03 NOTE — Unmapped (Signed)
[facility] Refill Coordination Note    Specialty Medication(s) to be Shipped:   General Specialty: Repatha    Other medication(s) to be shipped: N/A     Ryan Hale, DOB: 12-11-1975  Phone: [phone] (home)       All above HIPAA information was verified with patient.     Was a Nurse, learning disability used for this call? No    Completed refill call assessment today to schedule patient's medication shipment from the [facility] [phone]).       Specialty medication(s) and dose(s) confirmed: Regimen is correct and unchanged.   Changes to medications: Zaccai reports no changes at this time.  Changes to insurance: No  Questions for the pharmacist: No    Confirmed patient received Welcome Packet with first shipment. The patient will receive a drug information handout for each medication shipped and additional FDA Medication Guides as required.       DISEASE/MEDICATION-SPECIFIC INFORMATION        For patients on injectable medications: Patient currently has 0 doses left.  Next injection is scheduled for 07/13/19.    SPECIALTY MEDICATION ADHERENCE     Medication Adherence    Patient reported X missed doses in the last month: 0  Specialty Medication: Repatha  Patient is on additional specialty medications: No                Repatha 140 mg/ml: 0 days of medicine on hand         SHIPPING     Shipping address confirmed in Epic.     Delivery Scheduled: Yes, Expected medication delivery date: 07/11/19.     Medication will be delivered via Same Day Courier to the prescription address in Epic WAM.    Nancy Nordmann [facility] Specialty Technician

## 2019-07-11 MED FILL — REPATHA SURECLICK 140 MG/ML SUBCUTANEOUS PEN INJECTOR: 28 days supply | Qty: 2 | Fill #2 | Status: AC

## 2019-07-11 MED FILL — REPATHA SURECLICK 140 MG/ML SUBCUTANEOUS PEN INJECTOR: SUBCUTANEOUS | 28 days supply | Qty: 2 | Fill #2

## 2019-07-19 ENCOUNTER — Telehealth: Payer: Self-pay | Admitting: Family Medicine

## 2019-07-19 DIAGNOSIS — T07XXXA Unspecified multiple injuries, initial encounter: Secondary | ICD-10-CM

## 2019-07-19 DIAGNOSIS — Z79891 Long term (current) use of opiate analgesic: Secondary | ICD-10-CM

## 2019-07-19 DIAGNOSIS — G8929 Other chronic pain: Secondary | ICD-10-CM

## 2019-07-19 DIAGNOSIS — Z79899 Other long term (current) drug therapy: Secondary | ICD-10-CM

## 2019-07-19 NOTE — Telephone Encounter (Signed)
Pt came by the office to schedule an appt for med refill. Pt states he needs a refill on his Hydrocodone. He will be out on 07/21/2019. Pt has an appt on 08/02/2019 that was just scheduled. Pt would like to know if a refill can be called in before his appt or if he can be worked in before his appt.

## 2019-07-20 ENCOUNTER — Ambulatory Visit (INDEPENDENT_AMBULATORY_CARE_PROVIDER_SITE_OTHER): Payer: BC Managed Care – PPO | Admitting: Family Medicine

## 2019-07-20 ENCOUNTER — Encounter: Payer: Self-pay | Admitting: Family Medicine

## 2019-07-20 ENCOUNTER — Other Ambulatory Visit: Payer: Self-pay

## 2019-07-20 ENCOUNTER — Encounter
Admit: 2019-07-20 | Discharge: 2019-07-20 | Disposition: A | Discharge status: Home / Self Care | Payer: BLUE CROSS/BLUE SHIELD

## 2019-07-20 ENCOUNTER — Emergency Department
Admit: 2019-07-20 | Discharge: 2019-07-20 | Disposition: A | Discharge status: Home / Self Care | Payer: BLUE CROSS/BLUE SHIELD

## 2019-07-20 VITALS — Ht 72.0 in | Wt 233.0 lb

## 2019-07-20 DIAGNOSIS — R079 Chest pain, unspecified: Principal | ICD-10-CM

## 2019-07-20 DIAGNOSIS — M25552 Pain in left hip: Secondary | ICD-10-CM

## 2019-07-20 DIAGNOSIS — Z09 Encounter for follow-up examination after completed treatment for conditions other than malignant neoplasm: Secondary | ICD-10-CM

## 2019-07-20 DIAGNOSIS — E785 Hyperlipidemia, unspecified: Secondary | ICD-10-CM | POA: Diagnosis not present

## 2019-07-20 DIAGNOSIS — R0789 Other chest pain: Secondary | ICD-10-CM | POA: Diagnosis not present

## 2019-07-20 DIAGNOSIS — R000 Tachycardia, unspecified: Secondary | ICD-10-CM | POA: Diagnosis not present

## 2019-07-20 DIAGNOSIS — M79604 Pain in right leg: Secondary | ICD-10-CM | POA: Diagnosis not present

## 2019-07-20 DIAGNOSIS — T07XXXA Unspecified multiple injuries, initial encounter: Secondary | ICD-10-CM | POA: Diagnosis not present

## 2019-07-20 DIAGNOSIS — M25512 Pain in left shoulder: Secondary | ICD-10-CM | POA: Diagnosis not present

## 2019-07-20 DIAGNOSIS — Z7982 Long term (current) use of aspirin: Secondary | ICD-10-CM | POA: Diagnosis not present

## 2019-07-20 DIAGNOSIS — Z87891 Personal history of nicotine dependence: Secondary | ICD-10-CM | POA: Diagnosis not present

## 2019-07-20 DIAGNOSIS — K219 Gastro-esophageal reflux disease without esophagitis: Secondary | ICD-10-CM | POA: Diagnosis not present

## 2019-07-20 DIAGNOSIS — G8929 Other chronic pain: Secondary | ICD-10-CM

## 2019-07-20 DIAGNOSIS — R52 Pain, unspecified: Secondary | ICD-10-CM

## 2019-07-20 DIAGNOSIS — I4891 Unspecified atrial fibrillation: Secondary | ICD-10-CM | POA: Diagnosis not present

## 2019-07-20 DIAGNOSIS — Z79899 Other long term (current) drug therapy: Secondary | ICD-10-CM | POA: Diagnosis not present

## 2019-07-20 DIAGNOSIS — Z888 Allergy status to other drugs, medicaments and biological substances status: Secondary | ICD-10-CM | POA: Diagnosis not present

## 2019-07-20 DIAGNOSIS — I10 Essential (primary) hypertension: Secondary | ICD-10-CM | POA: Diagnosis not present

## 2019-07-20 DIAGNOSIS — Z7951 Long term (current) use of inhaled steroids: Secondary | ICD-10-CM | POA: Diagnosis not present

## 2019-07-20 DIAGNOSIS — I2510 Atherosclerotic heart disease of native coronary artery without angina pectoris: Secondary | ICD-10-CM | POA: Diagnosis not present

## 2019-07-20 DIAGNOSIS — I252 Old myocardial infarction: Secondary | ICD-10-CM | POA: Diagnosis not present

## 2019-07-20 DIAGNOSIS — M549 Dorsalgia, unspecified: Secondary | ICD-10-CM | POA: Diagnosis not present

## 2019-07-20 DIAGNOSIS — F419 Anxiety disorder, unspecified: Secondary | ICD-10-CM | POA: Diagnosis not present

## 2019-07-20 DIAGNOSIS — M79605 Pain in left leg: Secondary | ICD-10-CM

## 2019-07-20 DIAGNOSIS — J45909 Unspecified asthma, uncomplicated: Secondary | ICD-10-CM | POA: Diagnosis not present

## 2019-07-20 LAB — CBC W/ AUTO DIFF
BASOPHILS ABSOLUTE COUNT: 0 10*9/L (ref 0.0–0.1)
EOSINOPHILS ABSOLUTE COUNT: 0.1 10*9/L (ref 0.0–0.7)
EOSINOPHILS RELATIVE PERCENT: 1.7 %
HEMOGLOBIN: 12.4 g/dL — ABNORMAL LOW (ref 13.5–17.5)
LYMPHOCYTES ABSOLUTE COUNT: 1.1 10*9/L (ref 0.7–4.0)
LYMPHOCYTES RELATIVE PERCENT: 15.1 %
MEAN CORPUSCULAR HEMOGLOBIN CONC: 32.8 g/dL (ref 30.0–36.0)
MEAN CORPUSCULAR HEMOGLOBIN: 22.8 pg — ABNORMAL LOW (ref 26.0–34.0)
MEAN CORPUSCULAR VOLUME: 69.6 fL — ABNORMAL LOW (ref 81.0–95.0)
MEAN PLATELET VOLUME: 7.2 fL (ref 7.0–10.0)
MONOCYTES ABSOLUTE COUNT: 0.6 10*9/L (ref 0.1–1.0)
MONOCYTES RELATIVE PERCENT: 7.9 %
NEUTROPHILS ABSOLUTE COUNT: 5.3 10*9/L (ref 1.7–7.7)
NEUTROPHILS RELATIVE PERCENT: 74.8 %
NUCLEATED RED BLOOD CELLS: 0 /100{WBCs} (ref <=4)
PLATELET COUNT: 459 10*9/L — ABNORMAL HIGH (ref 150–450)
RED BLOOD CELL COUNT: 5.44 10*12/L (ref 4.32–5.72)
RED CELL DISTRIBUTION WIDTH: 16.6 % — ABNORMAL HIGH (ref 12.0–15.0)
WBC ADJUSTED: 7.1 10*9/L (ref 3.5–10.5)

## 2019-07-20 LAB — D-DIMER QUANTITATIVE (CH,ML,PD,ET): Fibrin D-dimer DDU:MCnc:Pt:PPP:Qn:: 167

## 2019-07-20 LAB — COMPREHENSIVE METABOLIC PANEL
ALBUMIN: 4.5 g/dL (ref 3.5–5.0)
ALKALINE PHOSPHATASE: 135 U/L — ABNORMAL HIGH (ref 38–126)
ALT (SGPT): 39 U/L (ref <50)
ANION GAP: 14 mmol/L (ref 7–15)
AST (SGOT): 29 U/L (ref 19–55)
BILIRUBIN TOTAL: 0.4 mg/dL (ref 0.0–1.2)
BLOOD UREA NITROGEN: 20 mg/dL (ref 7–21)
BUN / CREAT RATIO: 18
CALCIUM: 9.6 mg/dL (ref 8.5–10.2)
CO2: 26 mmol/L (ref 22.0–30.0)
EGFR CKD-EPI AA MALE: >90 mL/min/{1.73_m2} (ref >=60)
EGFR CKD-EPI NON-AA MALE: 83 mL/min/{1.73_m2} (ref >=60)
GLUCOSE RANDOM: 121 mg/dL (ref 70–179)
POTASSIUM: 4 mmol/L (ref 3.5–5.0)
PROTEIN TOTAL: 8.9 g/dL — ABNORMAL HIGH (ref 6.5–8.3)
SODIUM: 140 mmol/L (ref 135–145)

## 2019-07-20 LAB — LIPASE: Triacylglycerol lipase:CCnc:Pt:Ser/Plas:Qn:: 85

## 2019-07-20 LAB — BLOOD UREA NITROGEN: Urea nitrogen:MCnc:Pt:Ser/Plas:Qn:: 20

## 2019-07-20 LAB — TROPONIN I
Troponin I.cardiac:MCnc:Pt:Ser/Plas:Qn:: <0.06
Troponin I.cardiac:MCnc:Pt:Ser/Plas:Qn:: <0.06

## 2019-07-20 LAB — MONOCYTES ABSOLUTE COUNT: Monocytes:NCnc:Pt:Bld:Qn:Automated count: 0.6

## 2019-07-20 MED ORDER — TIZANIDINE HCL 4 MG PO TABS: 2.0000 mg | ORAL_TABLET | Freq: Three times a day (TID) | ORAL | 2 refills | Status: DC | PRN

## 2019-07-20 MED ORDER — HYDROCODONE-ACETAMINOPHEN 10-325 MG PO TABS: 0.5000 | ORAL_TABLET | Freq: Two times a day (BID) | ORAL | 0 refills | Status: DC | PRN

## 2019-07-20 MED ORDER — PREGABALIN 25 MG PO CAPS: 25.0000 mg | ORAL_CAPSULE | Freq: Two times a day (BID) | ORAL | 3 refills | Status: DC | PRN

## 2019-07-20 MED ORDER — PREGABALIN 25 MG PO CAPS: 25.0000 mg | ORAL_CAPSULE | Freq: Every day | ORAL | 3 refills | Status: DC

## 2019-07-20 MED ORDER — DICLOFENAC SODIUM 1 % EX GEL: 2.0000 g | Freq: Four times a day (QID) | CUTANEOUS | 5 refills | Status: DC | PRN

## 2019-07-20 MED ORDER — DULOXETINE HCL 30 MG PO CPEP: 30.0000 mg | ORAL_CAPSULE | Freq: Every day | ORAL | 1 refills | Status: DC

## 2019-07-20 NOTE — Unmapped (Signed)
Pt ambulatory to triage c/o episode of rapid HR this AM (120 bpm at home). Pt reports episode of L sided shoulder/back pain with these as well at night. Pt reports taking sertraline last night thinking there may be an anxiety component.
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
## 2019-07-20 NOTE — Patient Instructions (Addendum)
Dale Kennedy,   Please come in person for your next visit.  Bring all your medications and bottles with you.  Please contact the [facility] clinic to see if they are willing to see you as a patient in their physiatry department.  I referred you there a while ago please call and follow-up.  Currently the pain management plan is to continue Lyrica 25 mg at bedtime, start Cymbalta 30 mg in the morning.  Please use the following treatments and medications as needed throughout the day to manage pain prior to using narcotic pain medication: Tylenol (less than 3000 mg in 24 hours -per dosing on Tylenol box or bottle) Heating pad Muscle relaxers as prescribed Diclofenac gel as prescribed  I have sent in the same amount of Norco as the prior months, however I did review your chart more thoroughly after we had our visit today and last year with Dr. Sanda Kennedy in the beginning of the year to [facility] up until she left, they had decreased her dose to only 5 mg daily.  You were currently on an average daily dose of 2-1/2 times that.  Your prior providers and I all have the same goal in mind which is helping you be well have a good quality of life and not suffer because of excessive severe daily pain.  We will be decreasing her medications back to what they had previously.   If you fail to comply with the current plan or follow-up at required in office appointments that I will be unable to prescribe any further narcotic pain medication for you.  You will be given a notification of this with a 30-day taper to help you avoid withdrawal (although narcotic withdrawal is uncomfortable, it is not fatal).  See the information below about controlled substance medication rules and guidelines  Again if you send a MyChart message, please note that these will be responded to in 24 to 48 hours repeated messages will be ignored.  If you send in notes or refill request from the pharmacy,please note there is a 3 day (business  day) window for providers to get to medication refill requests.  These do not send repeated requests within the 3 days.  [facility] to see if they have processed your referral to see Dr. Sharlet Kennedy Referral has been sent to [place]: [id number] F: [phone]    Medication Rules   Purpose: To inform patients, and their family members, of our rules and regulations.   Applies to: All patients receiving prescriptions (written or electronic).   Pharmacy of record: Pharmacy where electronic prescriptions will be sent. If written prescriptions are taken to a different pharmacy, please inform the nursing staff. The pharmacy listed in the electronic medical record should be the one where you would like electronic prescriptions to be sent.   Electronic prescriptions: In compliance with the [place] (STOP) Act of 2017 (Session Lanny Cramp [phone]), effective April 06, 2018, all controlled substances must be electronically prescribed. Calling prescriptions to the pharmacy will cease to exist.   Prescription refills: Only during scheduled appointments. Applies to all prescriptions.   NOTE: The following applies primarily to controlled substances (Opioid* Pain Medications).    Patient's responsibilities: 1. Pain Pills: Bring all pain pills to every appointment (except for procedure appointments). 2. Pill Bottles: Bring pills in original pharmacy bottle. Always bring the newest bottle. Bring bottle, even if empty. 3. Medication refills: You are responsible for knowing and keeping track of what  medications you take and those you need refilled. The day before your appointment: write a list of all prescriptions that need to be refilled. The day of the appointment: give the list to the admitting nurse. Prescriptions will be written only during appointments. No prescriptions will be written on procedure days. If you forget a medication: it will  not be "Called in", "Faxed", or "electronically sent". You will need to get another appointment to get these prescribed. No early refills. Do not call asking to have your prescription filled early. 4. Prescription Accuracy: You are responsible for carefully inspecting your prescriptions before leaving our office. Have the discharge nurse carefully go over each prescription with you, before taking them home. Make sure that your name is accurately spelled, that your address is correct. Check the name and dose of your medication to make sure it is accurate. Check the number of pills, and the written instructions to make sure they are clear and accurate. Make sure that you are given enough medication to last until your next medication refill appointment. 5. Taking Medication: Take medication as prescribed. When it comes to controlled substances, taking less pills or less frequently than prescribed is permitted and encouraged. Never take more pills than instructed. Never take medication more frequently than prescribed.  6. Inform other Doctors: Always inform, all of your healthcare providers, of all the medications you take. 7. Pain Medication from other Providers: You are not allowed to accept any additional pain medication from any other Doctor or Healthcare provider. There are two exceptions to this rule. (see below) In the event that you require additional pain medication, you are responsible for notifying us, as stated below. 8. Medication Agreement: You are responsible for carefully reading and following our Medication Agreement. This must be signed before receiving any prescriptions from our practice. Safely store a copy of your signed Agreement. Violations to the Agreement will result in no further prescriptions. (Additional copies of our Medication Agreement are available upon request.) 9. Laws, Rules, & Regulations: All patients are expected to follow all Federal and [employer], Rules, [employer] Ignorance of the Laws does not constitute a valid excuse. The use of any illegal substances is prohibited. 10. Adopted CDC guidelines & recommendations: Target dosing levels will be at or below 60 MME/day. Use of benzodiazepines** is not recommended.   Exceptions: There are only two exceptions to the rule of not receiving pain medications from other Healthcare Providers. 1. Exception #1 (Emergencies): In the event of an emergency (i.e.: accident requiring emergency care), you are allowed to receive additional pain medication. However, you are responsible for: As soon as you are able, call our office [phone], at any time of the day or night, and leave a message stating your name, the date and nature of the emergency, and the name and dose of the medication prescribed. In the event that your call is answered by a member of our staff, make sure to document and save the date, time, and the name of the person that took your information.  2. Exception #2 (Planned Surgery): In the event that you are scheduled by another doctor or dentist to have any type of surgery or procedure, you are allowed (for a period no longer than 30 days), to receive additional pain medication, for the acute post-op pain. However, in this case, you are responsible for picking up a copy of our "Post-op Pain Management for Surgeons" handout, and giving it to your surgeon or dentist. This  document is available at our office, and does not require an appointment to obtain it. Simply go to our office during business hours (Monday-Thursday from 8:00 AM to 4:00 PM) (Friday 8:00 AM to 12:00 Noon) or if you have a scheduled appointment with [place], prior to your surgery, and ask for it by name. In addition, you will need to provide [place] with your name, name of your surgeon, type of surgery, and date of procedure or surgery.   *Opioid medications include: morphine, codeine, oxycodone, oxymorphone, hydrocodone, hydromorphone, meperidine,  tramadol, tapentadol, buprenorphine, fentanyl, methadone. **Benzodiazepine medications include: diazepam (Valium), alprazolam (Xanax), clonazepam (Klonopine), lorazepam (Ativan), clorazepate (Tranxene), chlordiazepoxide (Librium), estazolam (Prosom), oxazepam (Serax), temazepam (Restoril), triazolam (Halcion)
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
## 2019-07-24 NOTE — Telephone Encounter (Signed)
ok 

## 2019-07-26 ENCOUNTER — Ambulatory Visit: Payer: BC Managed Care – PPO | Admitting: Gastroenterology

## 2019-07-30 ENCOUNTER — Ambulatory Visit
Admit: 2019-07-30 | Discharge: 2019-07-31 | Disposition: A | Discharge status: Home / Self Care | Payer: BLUE CROSS/BLUE SHIELD | Attending: Emergency Medicine

## 2019-07-30 ENCOUNTER — Encounter
Admit: 2019-07-30 | Discharge: 2019-07-31 | Disposition: A | Discharge status: Home / Self Care | Payer: BLUE CROSS/BLUE SHIELD | Attending: Emergency Medicine

## 2019-07-30 DIAGNOSIS — R000 Tachycardia, unspecified: Principal | ICD-10-CM

## 2019-07-30 DIAGNOSIS — Z7982 Long term (current) use of aspirin: Secondary | ICD-10-CM | POA: Diagnosis not present

## 2019-07-30 DIAGNOSIS — I2510 Atherosclerotic heart disease of native coronary artery without angina pectoris: Secondary | ICD-10-CM | POA: Diagnosis not present

## 2019-07-30 DIAGNOSIS — R05 Cough: Secondary | ICD-10-CM | POA: Diagnosis not present

## 2019-07-30 DIAGNOSIS — R079 Chest pain, unspecified: Secondary | ICD-10-CM | POA: Diagnosis not present

## 2019-07-30 DIAGNOSIS — Z888 Allergy status to other drugs, medicaments and biological substances status: Secondary | ICD-10-CM | POA: Diagnosis not present

## 2019-07-30 DIAGNOSIS — R110 Nausea: Secondary | ICD-10-CM | POA: Diagnosis not present

## 2019-07-30 DIAGNOSIS — J984 Other disorders of lung: Secondary | ICD-10-CM | POA: Diagnosis not present

## 2019-07-30 DIAGNOSIS — Z7951 Long term (current) use of inhaled steroids: Secondary | ICD-10-CM | POA: Diagnosis not present

## 2019-07-30 DIAGNOSIS — I10 Essential (primary) hypertension: Secondary | ICD-10-CM | POA: Diagnosis not present

## 2019-07-30 DIAGNOSIS — F419 Anxiety disorder, unspecified: Secondary | ICD-10-CM | POA: Diagnosis not present

## 2019-07-30 DIAGNOSIS — I4891 Unspecified atrial fibrillation: Secondary | ICD-10-CM | POA: Diagnosis not present

## 2019-07-30 DIAGNOSIS — Z6831 Body mass index (BMI) 31.0-31.9, adult: Secondary | ICD-10-CM | POA: Diagnosis not present

## 2019-07-30 DIAGNOSIS — E785 Hyperlipidemia, unspecified: Secondary | ICD-10-CM | POA: Diagnosis not present

## 2019-07-30 DIAGNOSIS — J45909 Unspecified asthma, uncomplicated: Secondary | ICD-10-CM | POA: Diagnosis not present

## 2019-07-30 DIAGNOSIS — Z8616 Personal history of COVID-19: Secondary | ICD-10-CM | POA: Diagnosis not present

## 2019-07-30 DIAGNOSIS — Z79899 Other long term (current) drug therapy: Secondary | ICD-10-CM | POA: Diagnosis not present

## 2019-07-30 DIAGNOSIS — R918 Other nonspecific abnormal finding of lung field: Secondary | ICD-10-CM | POA: Diagnosis not present

## 2019-07-30 DIAGNOSIS — K219 Gastro-esophageal reflux disease without esophagitis: Secondary | ICD-10-CM | POA: Diagnosis not present

## 2019-07-30 DIAGNOSIS — Z87891 Personal history of nicotine dependence: Secondary | ICD-10-CM | POA: Diagnosis not present

## 2019-07-30 DIAGNOSIS — I252 Old myocardial infarction: Secondary | ICD-10-CM | POA: Diagnosis not present

## 2019-07-30 LAB — ALBUMIN: Albumin:MCnc:Pt:Ser/Plas:Qn:: 4.6

## 2019-07-30 LAB — CBC W/ AUTO DIFF
BASOPHILS ABSOLUTE COUNT: 0.1 10*9/L (ref 0.0–0.1)
BASOPHILS RELATIVE PERCENT: 0.9 %
HEMATOCRIT: 39.9 % (ref 38.0–50.0)
HEMOGLOBIN: 12.7 g/dL — ABNORMAL LOW (ref 13.5–17.5)
LYMPHOCYTES ABSOLUTE COUNT: 1.7 10*9/L (ref 0.7–4.0)
LYMPHOCYTES RELATIVE PERCENT: 26.2 %
MEAN CORPUSCULAR HEMOGLOBIN CONC: 31.9 g/dL (ref 30.0–36.0)
MEAN CORPUSCULAR HEMOGLOBIN: 22.2 pg — ABNORMAL LOW (ref 26.0–34.0)
MEAN CORPUSCULAR VOLUME: 69.6 fL — ABNORMAL LOW (ref 81.0–95.0)
MEAN PLATELET VOLUME: 7.3 fL (ref 7.0–10.0)
MONOCYTES ABSOLUTE COUNT: 0.6 10*9/L (ref 0.1–1.0)
MONOCYTES RELATIVE PERCENT: 8.8 %
NEUTROPHILS ABSOLUTE COUNT: 4 10*9/L (ref 1.7–7.7)
NEUTROPHILS RELATIVE PERCENT: 62.4 %
PLATELET COUNT: 455 10*9/L — ABNORMAL HIGH (ref 150–450)
RED BLOOD CELL COUNT: 5.72 10*12/L (ref 4.32–5.72)
RED CELL DISTRIBUTION WIDTH: 16.3 % — ABNORMAL HIGH (ref 12.0–15.0)
WBC ADJUSTED: 6.4 10*9/L (ref 3.5–10.5)

## 2019-07-30 LAB — PROTIME: Coagulation tissue factor induced:Time:Pt:PPP:Qn:Coag: 14.2 — ABNORMAL HIGH

## 2019-07-30 LAB — COMPREHENSIVE METABOLIC PANEL
ALBUMIN: 4.6 g/dL (ref 3.5–5.0)
ALKALINE PHOSPHATASE: 149 U/L — ABNORMAL HIGH (ref 38–126)
ALT (SGPT): 34 U/L (ref <50)
AST (SGOT): 29 U/L (ref 19–55)
BILIRUBIN TOTAL: 0.3 mg/dL (ref 0.0–1.2)
BLOOD UREA NITROGEN: 15 mg/dL (ref 7–21)
BUN / CREAT RATIO: 12
CO2: 28 mmol/L (ref 22.0–30.0)
CREATININE: 1.24 mg/dL (ref 0.70–1.30)
EGFR CKD-EPI AA MALE: 82 mL/min/{1.73_m2} (ref >=60)
EGFR CKD-EPI NON-AA MALE: 71 mL/min/{1.73_m2} (ref >=60)
GLUCOSE RANDOM: 99 mg/dL (ref 70–179)
POTASSIUM: 4.1 mmol/L (ref 3.5–5.0)
PROTEIN TOTAL: 9.3 g/dL — ABNORMAL HIGH (ref 6.5–8.3)
SODIUM: 141 mmol/L (ref 135–145)

## 2019-07-30 LAB — PROTIME-INR: PROTIME: 14.2 s — ABNORMAL HIGH (ref 10.5–13.5)

## 2019-07-30 LAB — HEMATOCRIT: Hematocrit:VFr:Pt:Bld:Qn:: 39.9

## 2019-07-30 LAB — TROPONIN I: Troponin I.cardiac:MCnc:Pt:Ser/Plas:Qn:: <0.06

## 2019-07-30 NOTE — Unmapped (Signed)
Pt reports mid chest tightness and nausea x 4 days with intermittent increased HR to 130s.  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  Denies SOB, dizziness.

## 2019-07-31 ENCOUNTER — Encounter: Admit: 2019-07-31 | Discharge: 2019-08-01 | Payer: BLUE CROSS/BLUE SHIELD

## 2019-07-31 DIAGNOSIS — E785 Hyperlipidemia, unspecified: Secondary | ICD-10-CM | POA: Diagnosis not present

## 2019-07-31 LAB — LDL CHOLESTEROL DIRECT: Cholesterol.in LDL:MCnc:Pt:Ser/Plas:Qn:Direct assay: 79.9
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
## 2019-07-31 NOTE — Unmapped (Signed)
Bed: 13  Expected date:   Expected time:   Means of arrival:   Comments:  OS

## 2019-08-01 ENCOUNTER — Encounter
Admit: 2019-08-01 | Discharge: 2019-08-01 | Disposition: A | Discharge status: Home / Self Care | Payer: BLUE CROSS/BLUE SHIELD

## 2019-08-01 ENCOUNTER — Ambulatory Visit: Admit: 2019-08-01 | Discharge: 2019-08-02 | Payer: BLUE CROSS/BLUE SHIELD

## 2019-08-01 DIAGNOSIS — R002 Palpitations: Principal | ICD-10-CM

## 2019-08-01 DIAGNOSIS — R0602 Shortness of breath: Secondary | ICD-10-CM | POA: Diagnosis not present

## 2019-08-01 DIAGNOSIS — I4891 Unspecified atrial fibrillation: Secondary | ICD-10-CM | POA: Diagnosis not present

## 2019-08-01 DIAGNOSIS — Z7982 Long term (current) use of aspirin: Secondary | ICD-10-CM | POA: Diagnosis not present

## 2019-08-01 DIAGNOSIS — Z8249 Family history of ischemic heart disease and other diseases of the circulatory system: Secondary | ICD-10-CM | POA: Diagnosis not present

## 2019-08-01 DIAGNOSIS — Z79899 Other long term (current) drug therapy: Secondary | ICD-10-CM | POA: Diagnosis not present

## 2019-08-01 DIAGNOSIS — Z87891 Personal history of nicotine dependence: Secondary | ICD-10-CM | POA: Diagnosis not present

## 2019-08-01 DIAGNOSIS — Z7951 Long term (current) use of inhaled steroids: Secondary | ICD-10-CM | POA: Diagnosis not present

## 2019-08-01 DIAGNOSIS — Z8616 Personal history of COVID-19: Secondary | ICD-10-CM | POA: Diagnosis not present

## 2019-08-01 DIAGNOSIS — Z6831 Body mass index (BMI) 31.0-31.9, adult: Secondary | ICD-10-CM | POA: Diagnosis not present

## 2019-08-01 DIAGNOSIS — K219 Gastro-esophageal reflux disease without esophagitis: Secondary | ICD-10-CM | POA: Diagnosis not present

## 2019-08-01 DIAGNOSIS — I119 Hypertensive heart disease without heart failure: Secondary | ICD-10-CM | POA: Diagnosis not present

## 2019-08-01 DIAGNOSIS — R0789 Other chest pain: Secondary | ICD-10-CM | POA: Diagnosis not present

## 2019-08-01 DIAGNOSIS — J45909 Unspecified asthma, uncomplicated: Secondary | ICD-10-CM | POA: Diagnosis not present

## 2019-08-01 DIAGNOSIS — I252 Old myocardial infarction: Secondary | ICD-10-CM | POA: Diagnosis not present

## 2019-08-01 DIAGNOSIS — E785 Hyperlipidemia, unspecified: Secondary | ICD-10-CM | POA: Diagnosis not present

## 2019-08-01 DIAGNOSIS — I2510 Atherosclerotic heart disease of native coronary artery without angina pectoris: Secondary | ICD-10-CM | POA: Diagnosis not present

## 2019-08-01 LAB — CBC W/ AUTO DIFF
BASOPHILS ABSOLUTE COUNT: 0.1 10*9/L (ref 0.0–0.1)
BASOPHILS RELATIVE PERCENT: 0.8 %
EOSINOPHILS ABSOLUTE COUNT: 0.1 10*9/L (ref 0.0–0.7)
EOSINOPHILS RELATIVE PERCENT: 0.8 %
HEMATOCRIT: 41.3 % (ref 38.0–50.0)
HEMOGLOBIN: 13.1 g/dL — ABNORMAL LOW (ref 13.5–17.5)
LYMPHOCYTES ABSOLUTE COUNT: 1.3 10*9/L (ref 0.7–4.0)
LYMPHOCYTES RELATIVE PERCENT: 19.2 %
MEAN CORPUSCULAR HEMOGLOBIN CONC: 31.8 g/dL (ref 30.0–36.0)
MEAN CORPUSCULAR VOLUME: 70.5 fL — ABNORMAL LOW (ref 81.0–95.0)
MEAN PLATELET VOLUME: 7.3 fL (ref 7.0–10.0)
MONOCYTES ABSOLUTE COUNT: 0.6 10*9/L (ref 0.1–1.0)
MONOCYTES RELATIVE PERCENT: 9 %
NEUTROPHILS ABSOLUTE COUNT: 4.8 10*9/L (ref 1.7–7.7)
NEUTROPHILS RELATIVE PERCENT: 70.2 %
NUCLEATED RED BLOOD CELLS: 0 /100{WBCs} (ref <=4)
PLATELET COUNT: 460 10*9/L — ABNORMAL HIGH (ref 150–450)
RED BLOOD CELL COUNT: 5.85 10*12/L — ABNORMAL HIGH (ref 4.32–5.72)
RED CELL DISTRIBUTION WIDTH: 16.9 % — ABNORMAL HIGH (ref 12.0–15.0)

## 2019-08-01 LAB — COMPREHENSIVE METABOLIC PANEL
ALKALINE PHOSPHATASE: 153 U/L — ABNORMAL HIGH (ref 38–126)
ALT (SGPT): 32 U/L (ref <50)
ANION GAP: 12 mmol/L (ref 7–15)
AST (SGOT): 30 U/L (ref 19–55)
BILIRUBIN TOTAL: 0.4 mg/dL (ref 0.0–1.2)
BLOOD UREA NITROGEN: 15 mg/dL (ref 7–21)
BUN / CREAT RATIO: 13
CALCIUM: 10 mg/dL (ref 8.5–10.2)
CHLORIDE: 102 mmol/L (ref 98–107)
CO2: 27 mmol/L (ref 22.0–30.0)
CREATININE: 1.15 mg/dL (ref 0.70–1.30)
EGFR CKD-EPI AA MALE: 90 mL/min/{1.73_m2} (ref >=60)
EGFR CKD-EPI NON-AA MALE: 78 mL/min/{1.73_m2} (ref >=60)
GLUCOSE RANDOM: 98 mg/dL (ref 70–179)
POTASSIUM: 4.5 mmol/L (ref 3.5–5.0)
PROTEIN TOTAL: 9.6 g/dL — ABNORMAL HIGH (ref 6.5–8.3)
SODIUM: 141 mmol/L (ref 135–145)

## 2019-08-01 LAB — TROPONIN I: Troponin I.cardiac:MCnc:Pt:Ser/Plas:Qn:: <0.06

## 2019-08-01 LAB — PLATELET COUNT: Platelets:NCnc:Pt:Bld:Qn:Automated count: 460 — ABNORMAL HIGH

## 2019-08-01 LAB — ANION GAP: Anion gap 3:SCnc:Pt:Ser/Plas:Qn:: 12

## 2019-08-01 NOTE — Unmapped (Signed)
[facility] Refill Coordination Note    Specialty Medication(s) to be Shipped:   General Specialty: Repatha    Other medication(s) to be shipped: N/A     Ryan Hale, DOB: 05-02-1975  Phone: [phone] (home)       All above HIPAA information was verified with patient.     Was a Nurse, learning disability used for this call? No    Completed refill call assessment today to schedule patient's medication shipment from the [facility] [phone]).       Specialty medication(s) and dose(s) confirmed: Regimen is correct and unchanged.   Changes to medications: Ryan Hale reports no changes at this time.  Changes to insurance: No  Questions for the pharmacist: No    Confirmed patient received Welcome Packet with first shipment. The patient will receive a drug information handout for each medication shipped and additional FDA Medication Guides as required.       DISEASE/MEDICATION-SPECIFIC INFORMATION        For patients on injectable medications: Patient currently has 0 doses left.  Next injection is scheduled for 08/10/19.    SPECIALTY MEDICATION ADHERENCE     Medication Adherence    Patient reported X missed doses in the last month: 0  Specialty Medication: Repatha  Patient is on additional specialty medications: No                Repatha 140 mg/ml: 0 days of medicine on hand       SHIPPING     Shipping address confirmed in Epic.     Delivery Scheduled: Yes, Expected medication delivery date: 08/07/19.     Medication will be delivered via Same Day Courier to the prescription address in Epic WAM.    Ryan Hale [facility] Specialty Technician

## 2019-08-01 NOTE — Unmapped (Signed)
arriv amb to room; pt recently hospitalized for rapid hr; came in this AM and had zio patch placed. Pt states rapid hr w/ sob returned on the way home.
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
## 2019-08-02 ENCOUNTER — Ambulatory Visit: Payer: BC Managed Care – PPO | Admitting: Family Medicine

## 2019-08-07 DIAGNOSIS — E785 Hyperlipidemia, unspecified: Principal | ICD-10-CM

## 2019-08-07 NOTE — Unmapped (Signed)
Ryan Hale 's repatha shipment will be delayed as a result of the patient's insurance being terminated.      I have reached out to the patient and communicated the delay. We will call the patient back to reschedule the delivery upon resolution. We have not confirmed the new delivery date.  Patients next injection is Thursday.

## 2019-08-09 NOTE — Unmapped (Signed)
[name] 's REPATHA shipment will be sent out  as a result of obtained new insurance for the patient.      I have reached out to the patient and communicated the delivery change. We will reschedule the medication for the delivery date that the patient agreed upon.  We have confirmed the delivery date as 08/10/19 VIA SAME DAY COURIER.     Patient was approved for Direct Relief manufacturer assistance.

## 2019-08-10 MED FILL — REPATHA SURECLICK 140 MG/ML SUBCUTANEOUS PEN INJECTOR: 28 days supply | Qty: 2 | Fill #3 | Status: AC

## 2019-08-10 MED FILL — REPATHA SURECLICK 140 MG/ML SUBCUTANEOUS PEN INJECTOR: SUBCUTANEOUS | 28 days supply | Qty: 2 | Fill #3

## 2019-08-13 DIAGNOSIS — R0789 Other chest pain: Secondary | ICD-10-CM | POA: Diagnosis not present

## 2019-08-13 DIAGNOSIS — R000 Tachycardia, unspecified: Secondary | ICD-10-CM | POA: Diagnosis not present

## 2019-08-13 DIAGNOSIS — R0602 Shortness of breath: Secondary | ICD-10-CM | POA: Diagnosis not present

## 2019-08-13 DIAGNOSIS — R079 Chest pain, unspecified: Secondary | ICD-10-CM | POA: Diagnosis not present

## 2019-08-17 ENCOUNTER — Other Ambulatory Visit: Payer: Self-pay

## 2019-08-17 ENCOUNTER — Encounter: Payer: Self-pay | Admitting: Family Medicine

## 2019-08-17 ENCOUNTER — Ambulatory Visit (INDEPENDENT_AMBULATORY_CARE_PROVIDER_SITE_OTHER): Payer: BC Managed Care – PPO | Admitting: Family Medicine

## 2019-08-17 VITALS — BP 126/82 | HR 90 | Temp 97.9°F | Resp 14 | Ht 72.0 in | Wt 235.7 lb

## 2019-08-17 DIAGNOSIS — Z79899 Other long term (current) drug therapy: Secondary | ICD-10-CM

## 2019-08-17 DIAGNOSIS — R52 Pain, unspecified: Secondary | ICD-10-CM

## 2019-08-17 DIAGNOSIS — F419 Anxiety disorder, unspecified: Secondary | ICD-10-CM

## 2019-08-17 DIAGNOSIS — G8929 Other chronic pain: Secondary | ICD-10-CM

## 2019-08-17 DIAGNOSIS — Z79891 Long term (current) use of opiate analgesic: Secondary | ICD-10-CM

## 2019-08-17 MED ORDER — HYDROCODONE-ACETAMINOPHEN 5-325 MG PO TABS: 1.0000 | ORAL_TABLET | Freq: Two times a day (BID) | ORAL | 0 refills | Status: DC | PRN
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
## 2019-08-18 LAB — DM TEMPLATE

## 2019-08-18 LAB — DRUG MONITOR, PANEL 1, SCREEN, URINE
Amphetamines: NEGATIVE ng/mL (ref <500)
Barbiturates: NEGATIVE ng/mL (ref <300)
Benzodiazepines: NEGATIVE ng/mL (ref <100)
Cocaine Metabolite: NEGATIVE ng/mL (ref <150)
Creatinine: 91 mg/dL
Marijuana Metabolite: NEGATIVE ng/mL (ref <20)
Methadone Metabolite: NEGATIVE ng/mL (ref <100)
Opiates: POSITIVE ng/mL — AB (ref <100)
Oxidant: NEGATIVE ug/mL
Oxycodone: NEGATIVE ng/mL (ref <100)
Phencyclidine: NEGATIVE ng/mL (ref <25)
pH: 6.8 (ref 4.5–9.0)

## 2019-08-18 LAB — DRUG MONITOR, ALCOHOLMETAB, W/CONF, URINE: Alcohol Metabolites: NEGATIVE ng/mL

## 2019-08-22 ENCOUNTER — Ambulatory Visit: Admit: 2019-08-22 | Discharge: 2019-08-23

## 2019-08-25 NOTE — Unmapped (Signed)
[facility] Refill Coordination Note    Specialty Medication(s) to be Shipped:   General Specialty: Repatha    Other medication(s) to be shipped: N/A     Ryan Hale, DOB: [birth year]  Phone: [phone] (home)       All above HIPAA information was verified with patient.     Was a Nurse, learning disability used for this call? No    Completed refill call assessment today to schedule patient's medication shipment from the [facility] [phone]).       Specialty medication(s) and dose(s) confirmed: Regimen is correct and unchanged.   Changes to medications: Zael reports no changes at this time.  Changes to insurance: No  Questions for the pharmacist: No    Confirmed patient received Welcome Packet with first shipment. The patient will receive a drug information handout for each medication shipped and additional FDA Medication Guides as required.       DISEASE/MEDICATION-SPECIFIC INFORMATION        For patients on injectable medications: Patient currently has 1 doses left.  Next injection is scheduled for 08/25/19.    SPECIALTY MEDICATION ADHERENCE     Medication Adherence    Patient reported X missed doses in the last month: 0  Specialty Medication: Repatha  Patient is on additional specialty medications: No                Repatha 140 mg/ml: 1 days of medicine on hand          SHIPPING     Shipping address confirmed in Epic.     Delivery Scheduled: Yes, Expected medication delivery date: 09/05/19.     Medication will be delivered via Same Day Courier to the prescription address in Epic WAM.    Nancy Nordmann [facility] Specialty Technician

## 2019-08-28 ENCOUNTER — Encounter
Admit: 2019-08-28 | Discharge: 2019-08-28 | Disposition: A | Discharge status: Home / Self Care | Payer: BLUE CROSS/BLUE SHIELD

## 2019-08-28 LAB — CBC W/ AUTO DIFF
BASOPHILS ABSOLUTE COUNT: 0 10*9/L (ref 0.0–0.1)
BASOPHILS RELATIVE PERCENT: 0.5 %
EOSINOPHILS RELATIVE PERCENT: 0.4 %
HEMATOCRIT: 37.1 % — ABNORMAL LOW (ref 38.0–50.0)
HEMOGLOBIN: 11.8 g/dL — ABNORMAL LOW (ref 13.5–17.5)
LYMPHOCYTES ABSOLUTE COUNT: 1.2 10*9/L (ref 0.7–4.0)
LYMPHOCYTES RELATIVE PERCENT: 12.7 %
MEAN CORPUSCULAR HEMOGLOBIN CONC: 31.7 g/dL (ref 30.0–36.0)
MEAN CORPUSCULAR HEMOGLOBIN: 22 pg — ABNORMAL LOW (ref 26.0–34.0)
MEAN CORPUSCULAR VOLUME: 69.5 fL — ABNORMAL LOW (ref 81.0–95.0)
MONOCYTES ABSOLUTE COUNT: 0.7 10*9/L (ref 0.1–1.0)
MONOCYTES RELATIVE PERCENT: 8.2 %
NEUTROPHILS ABSOLUTE COUNT: 7.1 10*9/L (ref 1.7–7.7)
NEUTROPHILS RELATIVE PERCENT: 78.2 %
NUCLEATED RED BLOOD CELLS: 0 /100{WBCs} (ref <=4)
PLATELET COUNT: 473 10*9/L — ABNORMAL HIGH (ref 150–450)
RED BLOOD CELL COUNT: 5.34 10*12/L (ref 4.32–5.72)
RED CELL DISTRIBUTION WIDTH: 17.1 % — ABNORMAL HIGH (ref 12.0–15.0)
WBC ADJUSTED: 9.1 10*9/L (ref 3.5–10.5)

## 2019-08-28 LAB — BASIC METABOLIC PANEL
BLOOD UREA NITROGEN: 15 mg/dL (ref 9–23)
BUN / CREAT RATIO: 13
CALCIUM: 9.4 mg/dL (ref 8.7–10.4)
CO2: 25.6 mmol/L (ref 20.0–31.0)
CREATININE: 1.14 mg/dL — ABNORMAL HIGH (ref 0.60–1.10)
EGFR CKD-EPI AA MALE: >90 mL/min/{1.73_m2}
EGFR CKD-EPI NON-AA MALE: 78 mL/min/{1.73_m2}
GLUCOSE RANDOM: 93 mg/dL (ref 70–179)
POTASSIUM: 3.8 mmol/L (ref 3.5–5.1)
SODIUM: 136 mmol/L (ref 135–145)

## 2019-08-28 LAB — CO2: Carbon dioxide:SCnc:Pt:Ser/Plas:Qn:: 25.6

## 2019-08-28 LAB — MEAN CORPUSCULAR VOLUME: Erythrocyte mean corpuscular volume:EntVol:Pt:RBC:Qn:Automated count: 69.5 — ABNORMAL LOW

## 2019-08-28 LAB — TROPONIN I: Troponin I.cardiac:MCnc:Pt:Ser/Plas:Qn:: <0.06

## 2019-08-28 LAB — MAGNESIUM: Magnesium:MCnc:Pt:Ser/Plas:Qn:: 2

## 2019-08-28 NOTE — Unmapped (Signed)
Pt is coming in with c/o getting into a heated discussion with his son and now feels like he is having some palpitations. No pain but some tightness.

## 2019-08-30 ENCOUNTER — Ambulatory Visit: Admit: 2019-08-30 | Discharge: 2019-08-31

## 2019-08-30 ENCOUNTER — Encounter: Payer: Self-pay | Admitting: Family Medicine

## 2019-08-30 DIAGNOSIS — R718 Other abnormality of red blood cells: Secondary | ICD-10-CM

## 2019-08-30 NOTE — Telephone Encounter (Signed)
Please find old referral and dx and put in again to [facility] please

## 2019-09-04 ENCOUNTER — Encounter: Payer: Self-pay | Admitting: Family Medicine

## 2019-09-04 DIAGNOSIS — R079 Chest pain, unspecified: Principal | ICD-10-CM

## 2019-09-04 DIAGNOSIS — I2510 Atherosclerotic heart disease of native coronary artery without angina pectoris: Principal | ICD-10-CM

## 2019-09-05 DIAGNOSIS — I2510 Atherosclerotic heart disease of native coronary artery without angina pectoris: Principal | ICD-10-CM

## 2019-09-05 DIAGNOSIS — E785 Hyperlipidemia, unspecified: Principal | ICD-10-CM

## 2019-09-05 MED ORDER — REPATHA SURECLICK 140 MG/ML SUBCUTANEOUS PEN INJECTOR
SUBCUTANEOUS | 5 refills | 28 days
Start: 2019-09-05 — End: ?

## 2019-09-07 MED FILL — REPATHA SYRINGE 140 MG/ML SUBCUTANEOUS SYRINGE: 28 days supply | Qty: 2 | Fill #0 | Status: AC

## 2019-09-07 MED FILL — REPATHA SYRINGE 140 MG/ML SUBCUTANEOUS SYRINGE: SUBCUTANEOUS | 28 days supply | Qty: 2 | Fill #0

## 2019-09-07 NOTE — Unmapped (Signed)
[facility] Specialty Medication Onboarding    Specialty Medication: Repatha SYRINGES  Prior Authorization: Not Required   Financial Assistance: Yes - manufacture assistance approved via copay card or RX [employer]UGS (1102 bucket) as primary   Final Copay/Day Supply: $0 / 28    Insurance Restrictions: None     Notes to Pharmacist: M[employer] required pt to switch from pens to syringes, onboarding needed for syringes. Only will cover [facility] [id number].     The triage team has completed the benefits investigation and has determined that the patient is able to fill this medication at [facility]. Please contact the patient to complete the onboarding or follow up with the prescribing physician as needed.

## 2019-09-07 NOTE — Unmapped (Signed)
[facility]   Patient Onboarding/Medication Counseling    Ryan Hale is a 44 y.o. male with hyperlipidemia who I am counseling today on continuation of therapy.  I am speaking to the patient.    Was a Nurse, learning disability used for this call? No    Verified patient's date of birth / HIPAA.    Specialty medication(s) to be sent: General Specialty: Repatha      Non-specialty medications/supplies to be sent: n/a      Medications not needed at this time: n/a         Repatha (evolocumab)    The patient declined counseling on missed dose instructions, goals of therapy, side effects and monitoring parameters, warnings and precautions, drug/food interactions and storage, handling precautions, and disposal because they have taken the medication previously. Patient is switching from pens to syringes. The information in the declined sections below are for informational purposes only and was not discussed with patient.       Medication & Administration     Dosage: Inject the contents of 1 syringe (140mg ) under the skin every 2 weeks.    Administration: Administer under the skin of the abdomen, thigh or upper arm. Rotate sites with each injection.  ??? Injection instructions - Syringe:  o Remove 1 Repatha syringe and let stand at room temperature for at least 30 minutes.  o Check the syringe for the following:  - Expiration date  - Absence of any cracks or damage  - The medicine is clear and colorless and does not contain any particles  o Choose your injection site and clean with an alcohol wipe. Allow to air dry completely.  o Pull the gray needle cap straight off and dispose of it  o Pinch the skin (or stretch) with your thumb and fingers creating an area 2 inches wide  o Maintaining the pinch (or stretch) insert the needle at a 45 to 90 degree angle  o When you are ready to inject, slowly and steadily press the plunger rod down all the way until the syringe is empty  o When done, release your thumb and gently lift the syringe straight off the skin  o Dispose of the syringe in a sharps container. Do not try to recap the syringe with the gray needle cap.  o If there is blood at the injection site, press a cotton ball or gauze to the site. Do not rub the injection site.      Adherence/Missed dose instructions: Administer a missed dose within 7 days and resume your normal schedule.  If it has been more than 7 days and you inject every 2 weeks, skip the missed dose and resume your normal schedule..     Goals of Therapy     Lower cholesterol, prevention of cardiovascular events in patients with established cardiovascular disease    Side Effects & Monitoring Parameters   ??? Flu-like symptoms  ??? Signs of a common cold  ??? Back pain  ??? Injection site irritation  ??? Nose or throat irritation    The following side effects should be reported to the provider:  ??? Signs of an allergic reaction  ??? Signs of high blood sugar (confusion, drowsiness, increase thirst/hunger/urination, fast breathing, flushing)      Contraindications, Warnings, & Precautions     ??? Latex (the packaging of Repatha may contain natural rubber)    Drug/Food Interactions     ??? Medication list reviewed in Epic. The patient was instructed to inform  the care team before taking any new medications or supplements. No drug interactions identified.     Storage, Handling Precautions, & Disposal   ??? Repatha should be stored in the refrigerator. If necessary, Repatha may be kept at room temperature for no more than 30 days.  ??? Place used devices in a sharps container for disposal.      Current Medications (including OTC/herbals), Comorbidities and Allergies     Current Outpatient Medications   Medication Sig Dispense Refill   ??? acetaminophen (TYLENOL) 500 MG tablet Take 500 mg by mouth every four (4) hours as needed.      ??? albuterol HFA 90 mcg/actuation inhaler Inhale 2 puffs every six (6) hours as needed.      ??? aspirin 81 MG chewable tablet Chew 1 tablet (81 mg total) daily. 30 tablet 11   ??? atorvastatin (LIPITOR) 10 MG tablet Take 1 tablet (10 mg total) by mouth daily. 30 tablet 6   ??? budesonide (PULMICORT) 90 mcg/actuation inhaler Inhale 1 puff two (2) times a day as needed.      ??? calcium carbonate 300 mg (750 mg) Chew Antacid Extra-Strength 300 mg (750 mg) chewable tablet   take 2 tablets by mouth three times a day if needed for heart BURN     ??? chlorthalidone (HYGROTON) 25 MG tablet TAKE 1 TABLET(25 MG) BY MOUTH DAILY 30 tablet 6   ??? DEXILANT 60 mg capsule daily.      ??? diclofenac sodium (VOLTAREN) 1 % gel Apply 2 g topically two (2) times a day as needed.      ??? evolocumab (REPATHA SYRINGE) 140 mg/mL Syrg Inject the contents of 1 syringe (140 mg) under the skin every fourteen (14) days. 2 mL 5   ??? ezetimibe (ZETIA) 10 mg tablet Take 1 tablet (10 mg total) by mouth daily. 90 tablet 3   ??? famotidine (PEPCID) 20 MG tablet Take 1 tablet (20 mg total) by mouth Two (2) times a day. (Patient taking differently: Take 40 mg by mouth two (2) times a day as needed. ) 60 tablet 0   ??? fluticasone propionate (FLONASE) 50 mcg/actuation nasal spray daily as needed.      ??? HYDROcodone-acetaminophen (NORCO) 5-325 mg per tablet Take 0.5-1 tablets by mouth every six (6) hours as needed.      ??? loratadine (CLARITIN) 10 mg tablet Take 10 mg by mouth daily as needed.      ??? MUCINEX DM 60-1,200 mg Tb12 Take 1 tablet by mouth two (2) times a day as needed.      ??? nitroglycerin (NITROSTAT) 0.4 MG SL tablet Place 1 tablet (0.4 mg total) under the tongue every five (5) minutes as needed for chest pain. 30 tablet 2   ??? pregabalin (LYRICA) 25 MG capsule Take 25 mg poqam and 50 mg poqpm for chronic pain     ??? sertraline (ZOLOFT) 25 MG tablet Take 25 mg by mouth daily.     ??? TiZANidine (ZANAFLEX) 4 MG capsule Take 4 mg by mouth Three (3) times a day as needed.        No current facility-administered medications for this visit.       Allergies   Allergen Reactions   ??? Ace Inhibitors Swelling   ??? Lisinopril Swelling ??? Norvasc [Amlodipine] Palpitations       Patient Active Problem List   Diagnosis   ??? Chest pain   ??? HTN (hypertension)   ??? Anxiety   ??? HLD (hyperlipidemia)   ???  GERD (gastroesophageal reflux disease)   ??? Coronary artery disease involving native coronary artery of native heart without angina pectoris   ??? Adjustment disorder with mixed anxiety and depressed mood   ??? OSA (obstructive sleep apnea)   ??? Near syncope   ??? Statin intolerance   ??? COVID-19 virus infection       Reviewed and up to date in Epic.    Appropriateness of Therapy     Is medication and dose appropriate based on diagnosis? Yes    Prescription has been clinically reviewed: Yes    Baseline Quality of Life Assessment      How many days over the past month did your high cholesterol  keep you from your normal activities? For example, brushing your teeth or getting up in the morning. 0    Financial Information     Medication Assistance provided: Manufacturer Assistance    Anticipated copay of $0 reviewed with patient. Verified delivery address.    Delivery Information     Scheduled delivery date: 6/3    Expected start date: 6/3    Medication will be delivered via Same Day Courier to the prescription address in [facility].  This shipment will not require a signature.      Explained the services we provide at [facility] and that each month we would call to set up refills.  Stressed importance of returning phone calls so that we could ensure they receive their medications in time each month.  Informed patient that we should be setting up refills 7-10 days prior to when they will run out of medication.  A pharmacist will reach out to perform a clinical assessment periodically.  Informed patient that a welcome packet and a drug information handout will be sent.      Patient verbalized understanding of the above information as well as how to contact the pharmacy at [phone] option 4 with any questions/concerns.  The pharmacy is open Monday through Friday 8:30am-4:30pm.  A pharmacist is available 24/7 via pager to answer any clinical questions they may have.    Patient Specific Needs     - Does the patient have any physical, cognitive, or cultural barriers? No    - Patient prefers to have medications discussed with  Patient     - Is the patient or caregiver able to read and understand education materials at a high school level or above? Yes    - Patient's primary language is  English     - Is the patient high risk? No     - Does the patient require a Care Management Plan? No     - Does the patient require physician intervention or other additional services (i.e. nutrition, smoking cessation, social work)? No      Clydell Hakim  [facility] Specialty Pharmacist

## 2019-09-08 ENCOUNTER — Ambulatory Visit: Admit: 2019-09-08 | Discharge: 2019-09-09 | Payer: BLUE CROSS/BLUE SHIELD

## 2019-09-08 ENCOUNTER — Encounter: Admit: 2019-09-08 | Discharge: 2019-09-09 | Payer: BLUE CROSS/BLUE SHIELD

## 2019-09-08 DIAGNOSIS — R079 Chest pain, unspecified: Principal | ICD-10-CM

## 2019-09-11 ENCOUNTER — Ambulatory Visit: Payer: BC Managed Care – PPO | Admitting: Gastroenterology

## 2019-09-11 NOTE — Progress Notes (Deleted)
[name] MD, MRCP(U.K) [address]  [place]  [place], [place] [address]  Main: [phone]  Fax: [phone]   Primary Care Physician: Delsa Grana, PA-C  Primary Gastroenterologist:  Dr. [name]   Follow up for esophageal variceal banding  HPI: Dale Kennedy is a 44 y.o. male    Summary of history : He was initially seen on 04/21/16 for abnormal LFT's, including a positive smooth muscle antibody . He also did have an iron deficiency anemia. Lastfewyears has had an elevated alkaline phosphotase with normal transaminases .Fractionated components have been checked on two occasions show a mildly elevated liver component with normal bone components. GGT too has been mildly elevated in the past . RUQ USG shows no abnormality and normal bile ducts.In April 2020 he was treated for IBS D with Xifaxan.  He was seen on 11/03/2018 for discomfort in his throat and center of his chest only when he eats Despite taking his PPI twice a day he says that the chest discomfort has not improved. In fact while he takes the PPI things are actually worse. He was told by his cardiologist to stop the Plavix. He was suggested to get further GI evaluation.  04/2016-MRCP normal  05/2016- Hb 13.1  HCV ab ,urine analysis , HIV ab ,celiac serology,HvsAgANA,AMA -negative Ceruloplasmin,A1AT - normal  F actin weakly positive 27  CRP elevated 2.6  Smooth muscle antibody has been elevated at 23 and 29 on two occasions when checked. 04/07/2018 : H pylori breath test - negative.  03/2018 : HB 13 grams  02/2018 : LFT's normal  05/25/2018 : Hb stable at 12.6 grams . Black stool likely due to taking Peptobismol.  05/2016 EGD- normal with duodenal biopsies Colonoscopy -few ulcers seen on the ileo cecal valve - Normal TI biopsy,IC valve showed mild acute inflammation -no chronic changes. 10/2016 : Capsule study normal  12/16/2018: EGD: Normal biopsies of the esophagus.  No evidence of eosinophilic  esophagitis.  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   Seen by Dr Grayland Ormond in 9/2064for abnormal elevated kappa light chains , microcytosis , placed on oral iron. Was previously treated for IBS D with Xifaxan in April 2020. ED visit on 01/03/2019 for atypical chest pain. 12/19/2018 similar presentation to [facility] ER [facility] Similar presentations on 01/24/2019 and 01/25/2019 to the emergency room.  Discharged home to follow-up with cardiology.  He is on sertraline for anxiety.  Interval history  06/10/2019 complaint of rectal bleeding.  Performed colonoscopy on 06/11/2019 showed nonbleeding large internal hemorrhoids.  Otherwise was normal. Commenced banding for internal hemorrhoids on 06/13/2019.  Right anterior group banded.   Patient follow-ups today for banding of hemorrhoids    Digital rectal exam performed in the presence of a chaperone. External anal findings: *** Internal findings:*** , No masses, no blood on glove noticed.    PROCEDURE NOTE: The patient presents with symptomatic grade {1-4:31454} hemorrhoids, unresponsive to maximal medical therapy, requesting rubber band ligation of his/her hemorrhoidal disease.  All risks, benefits and alternative forms of therapy were described and informed consent was obtained.  In the Left Lateral Decubitus position (if anoscopy is performed) anoscopic examination revealed grade {1-4:31454} hemorrhoids in the {CHL AMB HEMORRHOID POSITION:210130901} position(s).   The decision was made to band the {CHL AMB HEMORRHOID POSITION:210130901} internal hemorrhoid, and the Natalia was used to perform band ligation without complication.  Digital anorectal examination was then performed to assure  proper positioning of the band, and to adjust the banded tissue as required.  The patient was discharged home without pain or other issues.  Dietary and behavioral  recommendations were given and (if necessary - prescriptions were given), along with follow-up instructions.  The patient will return {1-4:31454} {CHL AMB WEEKS/MONTHS/AS NEEDED:210130900} for follow-up and possible additional banding as required.  No complications were encountered and the patient tolerated the procedure well.     Current Outpatient Medications  Medication Sig Dispense Refill  . albuterol (VENTOLIN HFA) 108 (90 Base) MCG/ACT inhaler Inhale 2 puffs into the lungs every 6 (six) hours as needed for wheezing or shortness of breath. 18 g 2  . budesonide-formoterol (SYMBICORT) 160-4.5 MCG/ACT inhaler Inhale 2 puffs into the lungs 2 (two) times daily. 1 Inhaler 3  . chlorthalidone (HYGROTON) 25 MG tablet Take 25 mg by mouth daily.     Marland Kitchen dexlansoprazole (DEXILANT) 60 MG capsule Take 1 capsule (60 mg total) by mouth daily. 90 capsule 3  . diclofenac Sodium (VOLTAREN) 1 % GEL Apply 2 g topically 4 (four) times daily as needed (for chronic pain management). 100 g 5  . DULoxetine (CYMBALTA) 30 MG capsule Take 1 capsule (30 mg total) by mouth daily. 90 capsule 1  . Evolocumab (REPATHA SURECLICK) 341 MG/ML SOAJ Inject into the skin every 14 (fourteen) days.     Marland Kitchen ezetimibe (ZETIA) 10 MG tablet Take 1 tablet (10 mg total) by mouth daily. For cholesterol, take in the morning 30 tablet 11  . HYDROcodone-acetaminophen (NORCO) 5-325 MG tablet Take 1 tablet by mouth 2 (two) times daily as needed. 60 tablet 0  . loratadine (CLARITIN) 10 MG tablet TAKE 1 TABLET BY MOUTH ONCE DAILY AS NEEDED FOR ALLERGIES (Patient taking differently: Take 10 mg by mouth daily as needed for allergies. ) 30 tablet 11  . nitroGLYCERIN (NITROSTAT) 0.4 MG SL tablet Place 1 tablet under the tongue as needed.  2  . pregabalin (LYRICA) 25 MG capsule Take 1 capsule (25 mg total) by mouth at bedtime. Take only once daily if too sedating 60 capsule 3  . sertraline (ZOLOFT) 25 MG tablet Take 1 tablet (25 mg total) by mouth daily.  90 tablet 3  . tiZANidine (ZANAFLEX) 4 MG tablet Take 0.5-1.5 tablets (2-6 mg total) by mouth every 8 (eight) hours as needed for muscle spasms (muscle tightness). 90 tablet 2  . Vitamin D, Ergocalciferol, (DRISDOL) 1.25 MG (50000 UNIT) CAPS capsule Take 1 capsule (50,000 Units total) by mouth every 7 (seven) days. x12 weeks. 12 capsule 0   No current facility-administered medications for this visit.    Allergies as of 09/11/2019 - Review Complete 09/04/2019  Allergen Reaction Noted  . Ace inhibitors Swelling 12/24/2016    ROS:  General: Negative for anorexia, weight loss, fever, chills, fatigue, weakness. ENT: Negative for hoarseness, difficulty swallowing , nasal congestion. CV: Negative for chest pain, angina, palpitations, dyspnea on exertion, peripheral edema.  Respiratory: Negative for dyspnea at rest, dyspnea on exertion, cough, sputum, wheezing.  GI: See history of present illness. GU:  Negative for dysuria, hematuria, urinary incontinence, urinary frequency, nocturnal urination.  Endo: Negative for unusual weight change.    Physical Examination:   There were no vitals taken for this visit.  General: Well-nourished, well-developed in no acute distress.  Psych: Alert and cooperative, normal mood and affect.   Imaging Studies: No results found.  Assessment and Plan:   Dale Kennedy is a 44 y.o. y/o male here to follow-up  for banding of internal hemorrhoids.  Right anterior column banded on 06/13/2019.  Plan:  1. Avoid constipation.  Commence on stool softeners if not already on  Follow-up:***  Dr [name] MD,MRCP [facility]) Gastroenterology/Hepatology Pager: [phone]

## 2019-09-15 ENCOUNTER — Encounter
Admit: 2019-09-15 | Discharge: 2019-09-15 | Disposition: A | Discharge status: Home / Self Care | Payer: BLUE CROSS/BLUE SHIELD

## 2019-09-15 DIAGNOSIS — T887XXA Unspecified adverse effect of drug or medicament, initial encounter: Principal | ICD-10-CM

## 2019-09-15 DIAGNOSIS — F419 Anxiety disorder, unspecified: Secondary | ICD-10-CM | POA: Diagnosis not present

## 2019-09-15 DIAGNOSIS — I4891 Unspecified atrial fibrillation: Secondary | ICD-10-CM | POA: Diagnosis not present

## 2019-09-15 DIAGNOSIS — T50995A Adverse effect of other drugs, medicaments and biological substances, initial encounter: Secondary | ICD-10-CM | POA: Diagnosis not present

## 2019-09-15 DIAGNOSIS — Z7982 Long term (current) use of aspirin: Secondary | ICD-10-CM | POA: Diagnosis not present

## 2019-09-15 DIAGNOSIS — R002 Palpitations: Secondary | ICD-10-CM | POA: Diagnosis not present

## 2019-09-15 DIAGNOSIS — E785 Hyperlipidemia, unspecified: Secondary | ICD-10-CM | POA: Diagnosis not present

## 2019-09-15 DIAGNOSIS — R0600 Dyspnea, unspecified: Secondary | ICD-10-CM | POA: Diagnosis not present

## 2019-09-15 DIAGNOSIS — R079 Chest pain, unspecified: Secondary | ICD-10-CM | POA: Diagnosis not present

## 2019-09-15 DIAGNOSIS — J302 Other seasonal allergic rhinitis: Secondary | ICD-10-CM | POA: Diagnosis not present

## 2019-09-15 DIAGNOSIS — I10 Essential (primary) hypertension: Secondary | ICD-10-CM | POA: Diagnosis not present

## 2019-09-15 DIAGNOSIS — I2510 Atherosclerotic heart disease of native coronary artery without angina pectoris: Secondary | ICD-10-CM | POA: Diagnosis not present

## 2019-09-15 DIAGNOSIS — Z87891 Personal history of nicotine dependence: Secondary | ICD-10-CM | POA: Diagnosis not present

## 2019-09-15 DIAGNOSIS — R0602 Shortness of breath: Secondary | ICD-10-CM | POA: Diagnosis not present

## 2019-09-15 DIAGNOSIS — T450X5A Adverse effect of antiallergic and antiemetic drugs, initial encounter: Secondary | ICD-10-CM | POA: Diagnosis not present

## 2019-09-15 DIAGNOSIS — Z7951 Long term (current) use of inhaled steroids: Secondary | ICD-10-CM | POA: Diagnosis not present

## 2019-09-15 DIAGNOSIS — R682 Dry mouth, unspecified: Secondary | ICD-10-CM | POA: Diagnosis not present

## 2019-09-15 LAB — COMPREHENSIVE METABOLIC PANEL
ALBUMIN: 3.5 g/dL (ref 3.4–5.0)
ALKALINE PHOSPHATASE: 123 U/L — ABNORMAL HIGH (ref 46–116)
ALT (SGPT): 28 U/L (ref 10–49)
ANION GAP: 3 mmol/L (ref 3–11)
AST (SGOT): 21 U/L (ref <34)
BLOOD UREA NITROGEN: 13 mg/dL (ref 9–23)
BUN / CREAT RATIO: 11
CALCIUM: 8.9 mg/dL (ref 8.7–10.4)
CHLORIDE: 104 mmol/L (ref 98–107)
CO2: 27.9 mmol/L (ref 20.0–31.0)
CREATININE: 1.16 mg/dL — ABNORMAL HIGH (ref 0.60–1.10)
EGFR CKD-EPI AA MALE: 89 mL/min/{1.73_m2}
EGFR CKD-EPI NON-AA MALE: 77 mL/min/{1.73_m2}
GLUCOSE RANDOM: 98 mg/dL (ref 70–179)
PROTEIN TOTAL: 8.1 g/dL (ref 5.7–8.2)
SODIUM: 135 mmol/L (ref 135–145)

## 2019-09-15 LAB — CBC W/ AUTO DIFF
BASOPHILS RELATIVE PERCENT: 0.8 %
EOSINOPHILS ABSOLUTE COUNT: 0.1 10*9/L (ref 0.0–0.7)
EOSINOPHILS RELATIVE PERCENT: 2 %
HEMATOCRIT: 37.7 % — ABNORMAL LOW (ref 38.0–50.0)
HEMOGLOBIN: 12 g/dL — ABNORMAL LOW (ref 13.5–17.5)
LYMPHOCYTES ABSOLUTE COUNT: 1.3 10*9/L (ref 0.7–4.0)
LYMPHOCYTES RELATIVE PERCENT: 18.5 %
MEAN CORPUSCULAR HEMOGLOBIN CONC: 31.9 g/dL (ref 30.0–36.0)
MEAN CORPUSCULAR HEMOGLOBIN: 21.8 pg — ABNORMAL LOW (ref 26.0–34.0)
MEAN CORPUSCULAR VOLUME: 68.2 fL — ABNORMAL LOW (ref 81.0–95.0)
MEAN PLATELET VOLUME: 7.3 fL (ref 7.0–10.0)
MONOCYTES ABSOLUTE COUNT: 1 10*9/L (ref 0.1–1.0)
MONOCYTES RELATIVE PERCENT: 14.2 %
NEUTROPHILS ABSOLUTE COUNT: 4.4 10*9/L (ref 1.7–7.7)
NEUTROPHILS RELATIVE PERCENT: 64.5 %
PLATELET COUNT: 421 10*9/L (ref 150–450)
RED BLOOD CELL COUNT: 5.53 10*12/L (ref 4.32–5.72)
RED CELL DISTRIBUTION WIDTH: 17.1 % — ABNORMAL HIGH (ref 12.0–15.0)
WBC ADJUSTED: 6.8 10*9/L (ref 3.5–10.5)

## 2019-09-15 LAB — URINALYSIS WITH CULTURE REFLEX
BILIRUBIN UA: NEGATIVE
BLOOD UA: NEGATIVE
GLUCOSE UA: NEGATIVE
KETONES UA: NEGATIVE
LEUKOCYTE ESTERASE UA: NEGATIVE
NITRITE UA: NEGATIVE
PH UA: 7 (ref 5.0–9.0)
PROTEIN UA: NEGATIVE
RBC UA: 2 /HPF (ref <3)
SPECIFIC GRAVITY UA: 1.01 (ref 1.005–1.040)
SQUAMOUS EPITHELIAL: <1 /HPF (ref 0–5)
UROBILINOGEN UA: 0.2

## 2019-09-15 LAB — MICROCYTES

## 2019-09-15 LAB — KETONES UA: Ketones:MCnc:Pt:Urine:Qn:Test strip: NEGATIVE

## 2019-09-15 LAB — TROPONIN I: Troponin I.cardiac:MCnc:Pt:Ser/Plas:Qn:: <0.06

## 2019-09-15 LAB — GLUCOSE RANDOM: Glucose:MCnc:Pt:Ser/Plas:Qn:: 98

## 2019-09-15 NOTE — Unmapped (Signed)
Pt arrives with c/o elevated heart rate and dry mouth at home for a couple of days.
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
## 2019-09-17 ENCOUNTER — Encounter
Admit: 2019-09-17 | Discharge: 2019-09-17 | Disposition: A | Discharge status: Home / Self Care | Payer: BLUE CROSS/BLUE SHIELD | Attending: Emergency Medicine

## 2019-09-17 DIAGNOSIS — R0789 Other chest pain: Principal | ICD-10-CM

## 2019-09-17 DIAGNOSIS — J45909 Unspecified asthma, uncomplicated: Secondary | ICD-10-CM | POA: Diagnosis not present

## 2019-09-17 DIAGNOSIS — Z6832 Body mass index (BMI) 32.0-32.9, adult: Secondary | ICD-10-CM | POA: Diagnosis not present

## 2019-09-17 DIAGNOSIS — R0602 Shortness of breath: Secondary | ICD-10-CM | POA: Diagnosis not present

## 2019-09-17 DIAGNOSIS — Z7951 Long term (current) use of inhaled steroids: Secondary | ICD-10-CM | POA: Diagnosis not present

## 2019-09-17 DIAGNOSIS — E785 Hyperlipidemia, unspecified: Secondary | ICD-10-CM | POA: Diagnosis not present

## 2019-09-17 DIAGNOSIS — Z8616 Personal history of COVID-19: Secondary | ICD-10-CM | POA: Diagnosis not present

## 2019-09-17 DIAGNOSIS — Z79899 Other long term (current) drug therapy: Secondary | ICD-10-CM | POA: Diagnosis not present

## 2019-09-17 DIAGNOSIS — I4891 Unspecified atrial fibrillation: Secondary | ICD-10-CM | POA: Diagnosis not present

## 2019-09-17 DIAGNOSIS — Z7982 Long term (current) use of aspirin: Secondary | ICD-10-CM | POA: Diagnosis not present

## 2019-09-17 DIAGNOSIS — I10 Essential (primary) hypertension: Secondary | ICD-10-CM | POA: Diagnosis not present

## 2019-09-17 DIAGNOSIS — I252 Old myocardial infarction: Secondary | ICD-10-CM | POA: Diagnosis not present

## 2019-09-17 DIAGNOSIS — F419 Anxiety disorder, unspecified: Secondary | ICD-10-CM | POA: Diagnosis not present

## 2019-09-17 DIAGNOSIS — Z87891 Personal history of nicotine dependence: Secondary | ICD-10-CM | POA: Diagnosis not present

## 2019-09-17 DIAGNOSIS — Z888 Allergy status to other drugs, medicaments and biological substances status: Secondary | ICD-10-CM | POA: Diagnosis not present

## 2019-09-17 DIAGNOSIS — K219 Gastro-esophageal reflux disease without esophagitis: Secondary | ICD-10-CM | POA: Diagnosis not present

## 2019-09-17 DIAGNOSIS — I2510 Atherosclerotic heart disease of native coronary artery without angina pectoris: Secondary | ICD-10-CM | POA: Diagnosis not present

## 2019-09-17 LAB — CBC W/ AUTO DIFF
BASOPHILS RELATIVE PERCENT: 0.9 %
EOSINOPHILS ABSOLUTE COUNT: 0.1 10*9/L (ref 0.0–0.7)
EOSINOPHILS RELATIVE PERCENT: 1.4 %
HEMATOCRIT: 38.7 % (ref 38.0–50.0)
HEMOGLOBIN: 12.5 g/dL — ABNORMAL LOW (ref 13.5–17.5)
LYMPHOCYTES ABSOLUTE COUNT: 2 10*9/L (ref 0.7–4.0)
LYMPHOCYTES RELATIVE PERCENT: 25.5 %
MEAN CORPUSCULAR HEMOGLOBIN CONC: 32.2 g/dL (ref 30.0–36.0)
MEAN CORPUSCULAR HEMOGLOBIN: 22 pg — ABNORMAL LOW (ref 26.0–34.0)
MEAN CORPUSCULAR VOLUME: 68.2 fL — ABNORMAL LOW (ref 81.0–95.0)
MEAN PLATELET VOLUME: 7.3 fL (ref 7.0–10.0)
MONOCYTES ABSOLUTE COUNT: 0.9 10*9/L (ref 0.1–1.0)
MONOCYTES RELATIVE PERCENT: 11.2 %
NEUTROPHILS ABSOLUTE COUNT: 4.7 10*9/L (ref 1.7–7.7)
NEUTROPHILS RELATIVE PERCENT: 61 %
NUCLEATED RED BLOOD CELLS: 0 /100{WBCs} (ref <=4)
PLATELET COUNT: 453 10*9/L — ABNORMAL HIGH (ref 150–450)
RED BLOOD CELL COUNT: 5.68 10*12/L (ref 4.32–5.72)
RED CELL DISTRIBUTION WIDTH: 17.5 % — ABNORMAL HIGH (ref 12.0–15.0)
WBC ADJUSTED: 7.8 10*9/L (ref 3.5–10.5)

## 2019-09-17 LAB — COMPREHENSIVE METABOLIC PANEL
ALBUMIN: 3.7 g/dL (ref 3.4–5.0)
ALKALINE PHOSPHATASE: 131 U/L — ABNORMAL HIGH (ref 46–116)
ANION GAP: 5 mmol/L (ref 3–11)
AST (SGOT): 31 U/L (ref <34)
BILIRUBIN TOTAL: 0.3 mg/dL (ref 0.3–1.2)
BLOOD UREA NITROGEN: 13 mg/dL (ref 9–23)
BUN / CREAT RATIO: 10
CALCIUM: 9.3 mg/dL (ref 8.7–10.4)
CHLORIDE: 104 mmol/L (ref 98–107)
CO2: 26.5 mmol/L (ref 20.0–31.0)
CREATININE: 1.25 mg/dL — ABNORMAL HIGH (ref 0.60–1.10)
EGFR CKD-EPI AA MALE: 81 mL/min/{1.73_m2}
EGFR CKD-EPI NON-AA MALE: 70 mL/min/{1.73_m2}
GLUCOSE RANDOM: 103 mg/dL (ref 70–179)
POTASSIUM: 3.6 mmol/L (ref 3.5–5.1)
SODIUM: 135 mmol/L (ref 135–145)

## 2019-09-17 LAB — LIPASE: Triacylglycerol lipase:CCnc:Pt:Ser/Plas:Qn:: 28

## 2019-09-17 LAB — NEUTROPHILS ABSOLUTE COUNT: Neutrophils:NCnc:Pt:Bld:Qn:Automated count: 4.7

## 2019-09-17 LAB — ALBUMIN: Albumin:MCnc:Pt:Ser/Plas:Qn:: 3.7

## 2019-09-17 LAB — TROPONIN I
Troponin I.cardiac:MCnc:Pt:Ser/Plas:Qn:: <0.06
Troponin I.cardiac:MCnc:Pt:Ser/Plas:Qn:: <0.06

## 2019-09-17 IMAGING — CR DG CHEST 2V
1 series · 2 of 2 positions shown · non-contrast
Comparison: 12/24/2016 chest radiograph

CLINICAL DATA: 41 y/o  M; chest pain.

EXAM:
CHEST  2 VIEW

[Series 1: dg chest 2 view · 0.14mm/px · 2 of 2 slices shown]
[im 1/2]
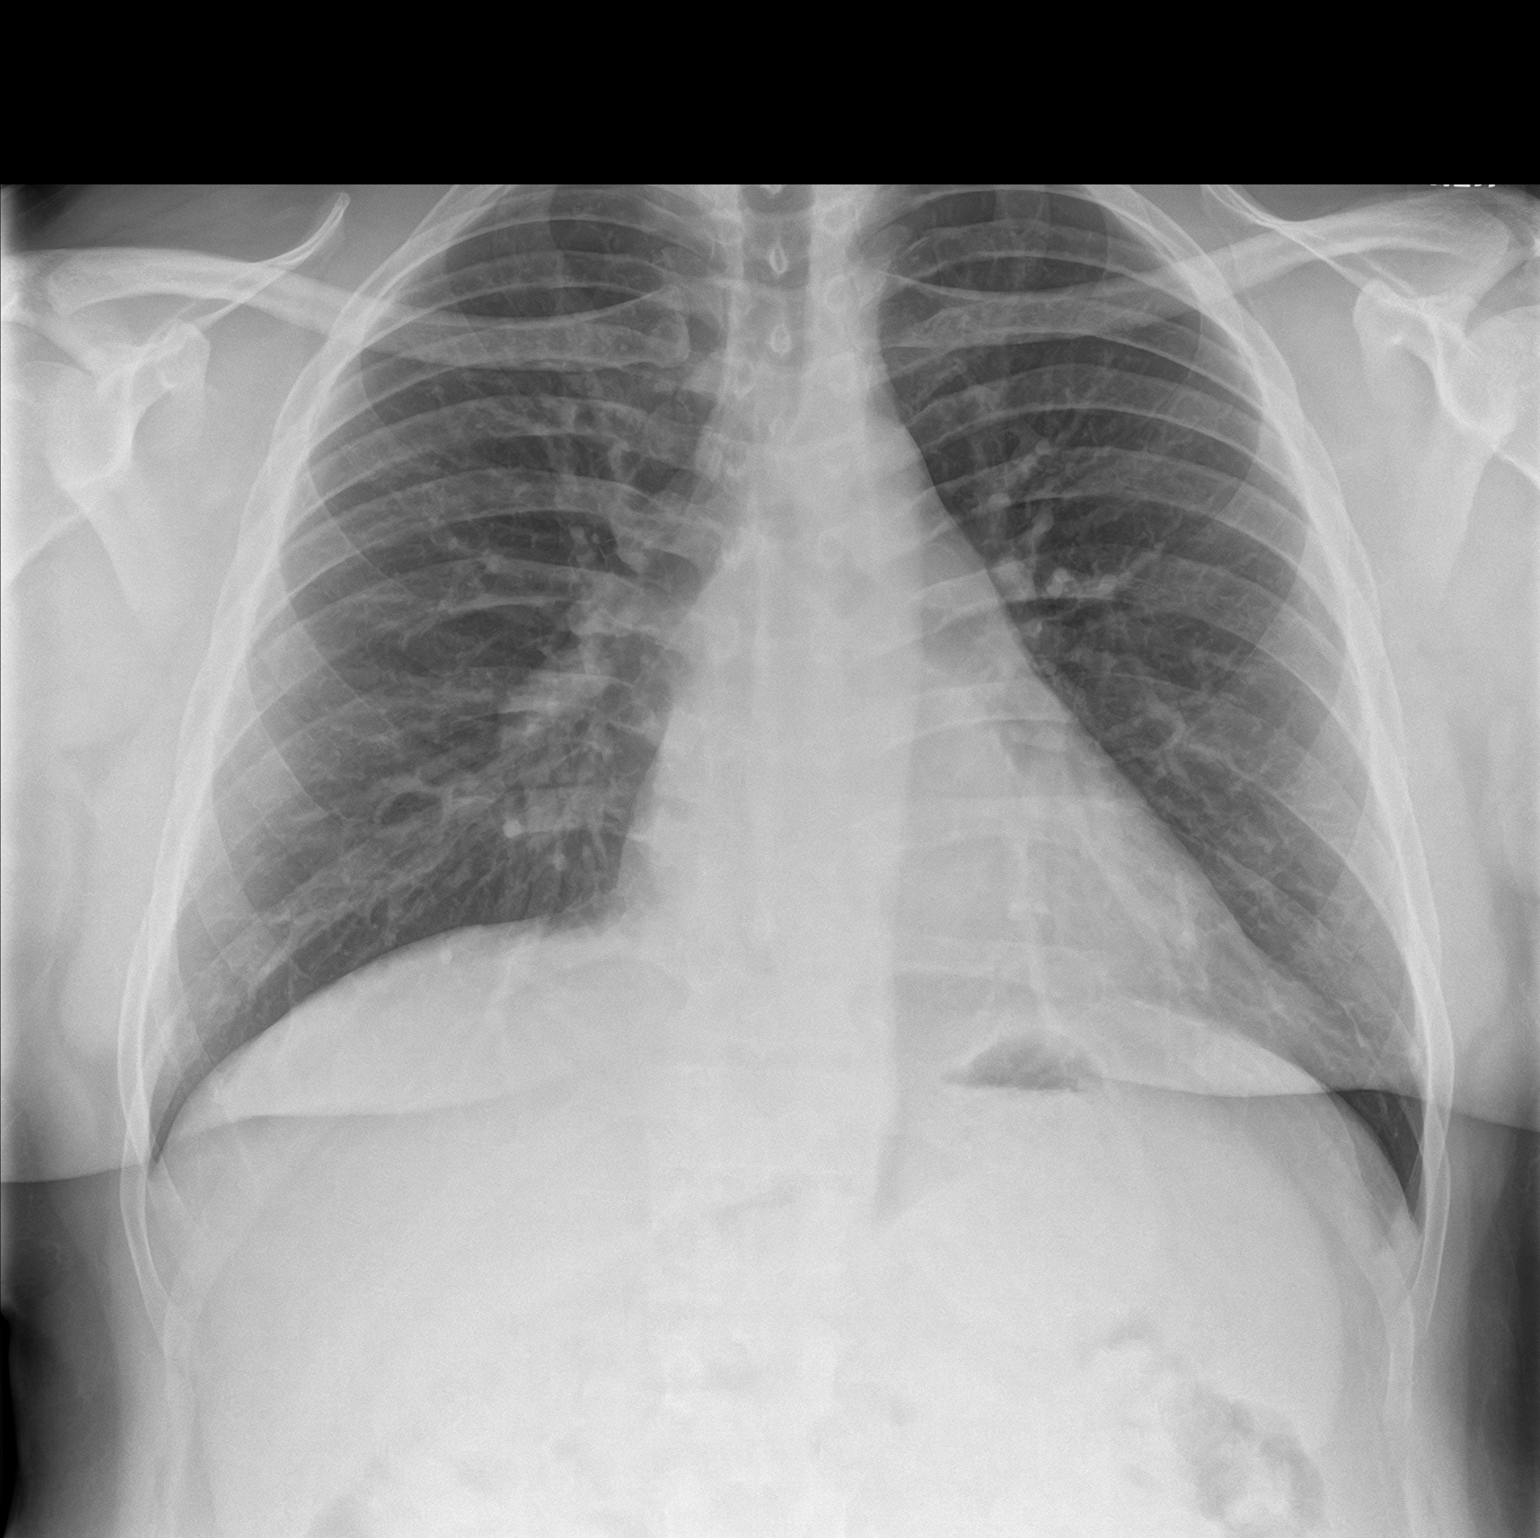
[im 2/2]
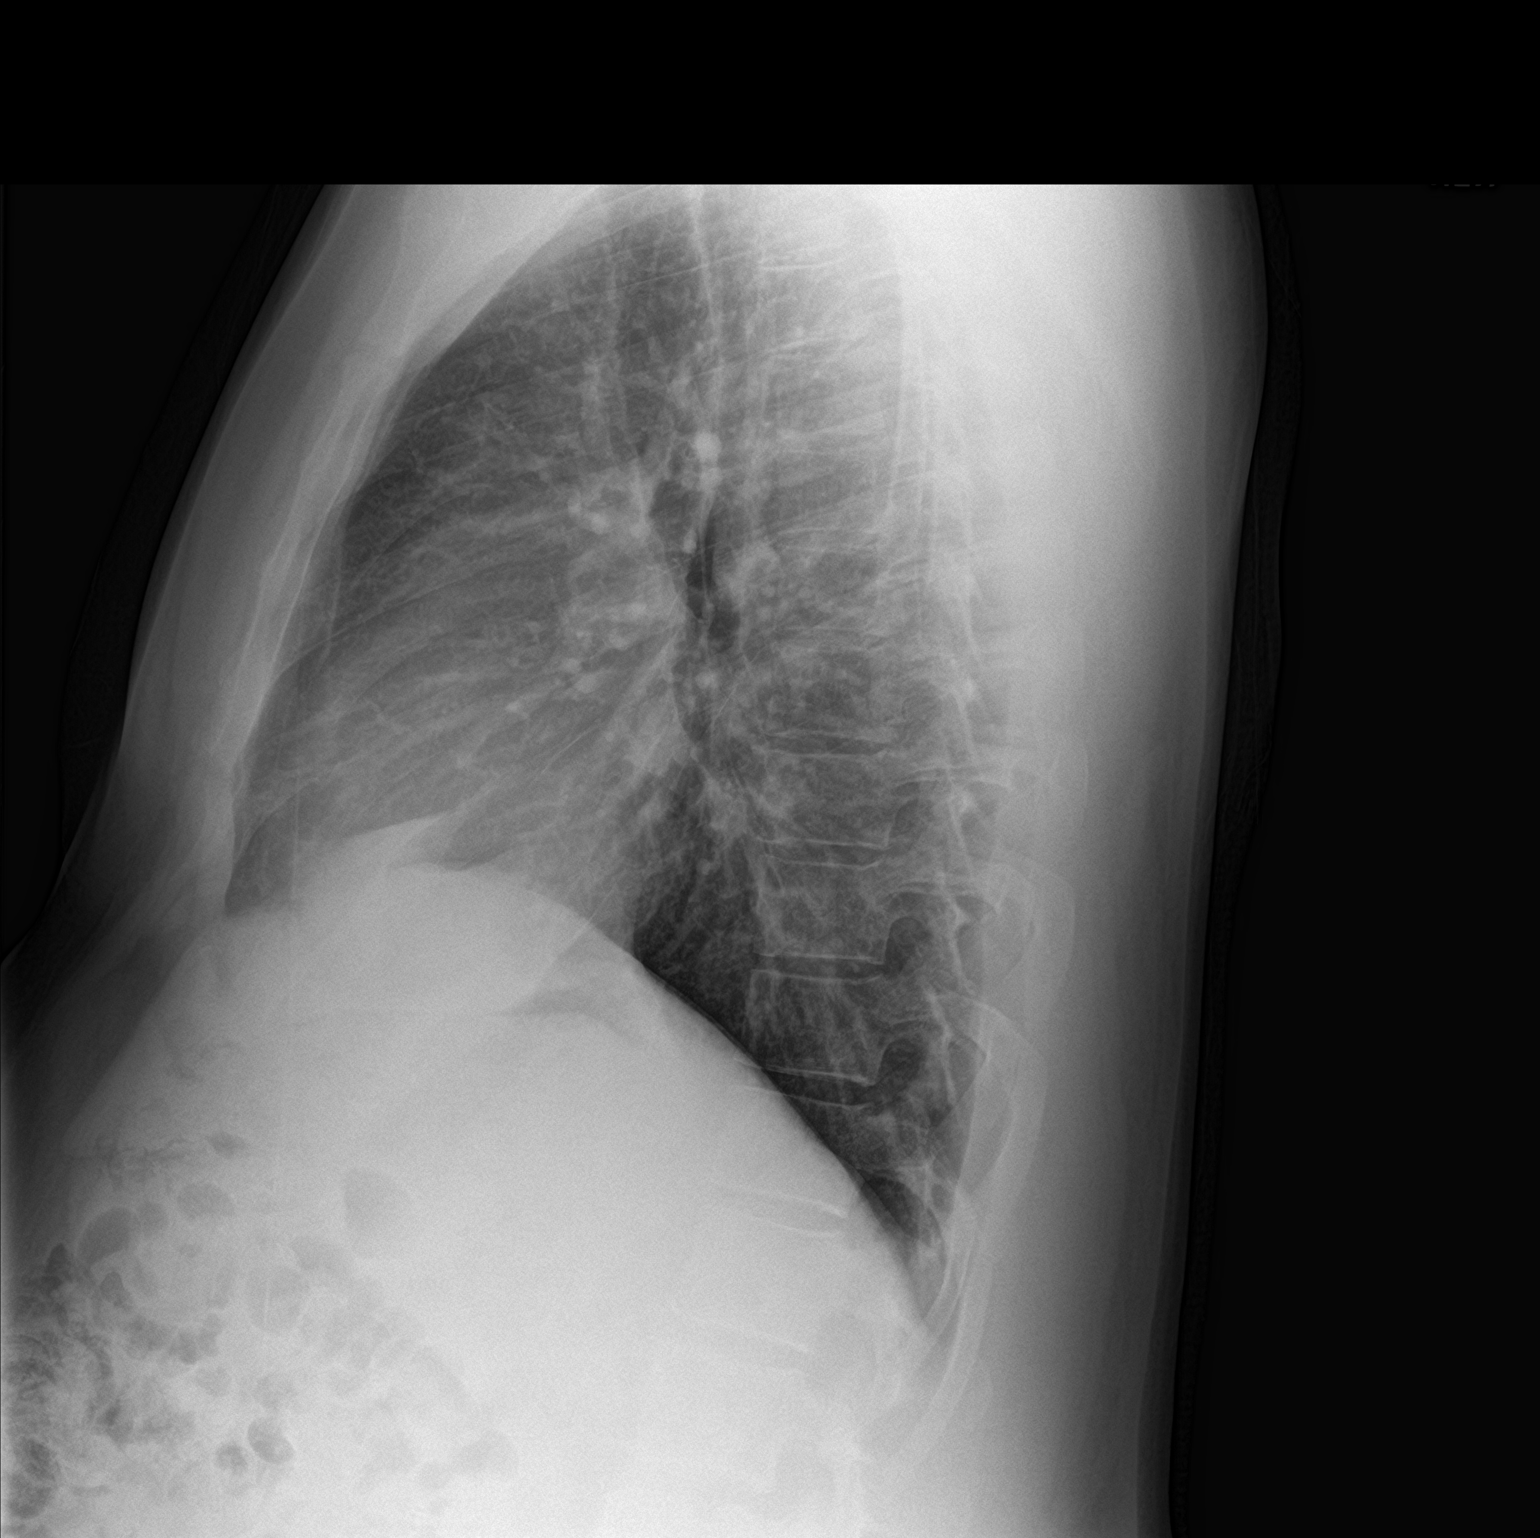

[2 of 2 positions shown; findings below may reference images not displayed]

FINDINGS: The heart size and mediastinal contours are within normal limits.
Both lungs are clear. The visualized skeletal structures are
unremarkable.
IMPRESSION: No acute pulmonary process identified.

By: Kenjirou Lioe M.D.

## 2019-09-17 MED ORDER — OMEPRAZOLE 20 MG CAPSULE,DELAYED RELEASE
ORAL_CAPSULE | Freq: Every day | ORAL | 0 refills | 14.00000 days | Status: CP
Start: 2019-09-17 — End: 2019-10-01

## 2019-09-17 MED ADMIN — sucralfate (CARAFATE) oral suspension: 1 g | ORAL | @ 07:00:00 | Stop: 2019-09-17

## 2019-09-17 MED ADMIN — aluminum-magnesium hydroxide-simethicone (MAALOX MAX) 80-80-8 mg/mL oral suspension: 30 mL | ORAL | @ 07:00:00 | Stop: 2019-09-17

## 2019-09-17 MED ADMIN — lidocaine (XYLOCAINE) 2% viscous mucosal solution: 10 mL | ORAL | @ 07:00:00 | Stop: 2019-09-17
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
## 2019-09-17 NOTE — Unmapped (Addendum)
Pt endorsing midgastric, chest pain, non-radiating with palpitations. He attributes the pain to acid reflux and with a history of MI, he opted to get checked out. Pt nauseous, denies vomiting, HA. Pt took 324 mg ASA PTA. No other concerns
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
## 2019-09-21 ENCOUNTER — Encounter: Payer: Self-pay | Admitting: Oncology

## 2019-09-21 NOTE — Progress Notes (Signed)
Patient prescreened for appointment.Patient reports that a few weeks ago he had some lab work down and he said "It looked like a lot of my labs were out of whack."

## 2019-09-22 ENCOUNTER — Other Ambulatory Visit: Payer: Self-pay

## 2019-09-22 ENCOUNTER — Inpatient Hospital Stay: Payer: BC Managed Care – PPO

## 2019-09-22 ENCOUNTER — Inpatient Hospital Stay: Payer: BC Managed Care – PPO | Attending: Oncology | Admitting: Oncology

## 2019-09-22 VITALS — BP 119/94 | HR 68 | Temp 97.1°F | Wt 238.5 lb

## 2019-09-22 DIAGNOSIS — Z791 Long term (current) use of non-steroidal anti-inflammatories (NSAID): Secondary | ICD-10-CM | POA: Insufficient documentation

## 2019-09-22 DIAGNOSIS — Z79899 Other long term (current) drug therapy: Secondary | ICD-10-CM | POA: Diagnosis not present

## 2019-09-22 DIAGNOSIS — R778 Other specified abnormalities of plasma proteins: Secondary | ICD-10-CM

## 2019-09-22 DIAGNOSIS — Z955 Presence of coronary angioplasty implant and graft: Secondary | ICD-10-CM | POA: Diagnosis not present

## 2019-09-22 DIAGNOSIS — E785 Hyperlipidemia, unspecified: Secondary | ICD-10-CM | POA: Insufficient documentation

## 2019-09-22 DIAGNOSIS — I2510 Atherosclerotic heart disease of native coronary artery without angina pectoris: Secondary | ICD-10-CM | POA: Insufficient documentation

## 2019-09-22 DIAGNOSIS — I110 Hypertensive heart disease with heart failure: Secondary | ICD-10-CM | POA: Insufficient documentation

## 2019-09-22 DIAGNOSIS — R768 Other specified abnormal immunological findings in serum: Secondary | ICD-10-CM | POA: Diagnosis not present

## 2019-09-22 DIAGNOSIS — R718 Other abnormality of red blood cells: Secondary | ICD-10-CM | POA: Diagnosis not present

## 2019-09-22 DIAGNOSIS — I252 Old myocardial infarction: Secondary | ICD-10-CM | POA: Diagnosis not present

## 2019-09-22 DIAGNOSIS — I4891 Unspecified atrial fibrillation: Secondary | ICD-10-CM | POA: Insufficient documentation

## 2019-09-22 DIAGNOSIS — Z87891 Personal history of nicotine dependence: Secondary | ICD-10-CM | POA: Diagnosis not present

## 2019-09-22 DIAGNOSIS — K219 Gastro-esophageal reflux disease without esophagitis: Secondary | ICD-10-CM | POA: Insufficient documentation

## 2019-09-22 DIAGNOSIS — Z7902 Long term (current) use of antithrombotics/antiplatelets: Secondary | ICD-10-CM | POA: Insufficient documentation

## 2019-09-22 DIAGNOSIS — J45909 Unspecified asthma, uncomplicated: Secondary | ICD-10-CM | POA: Diagnosis not present

## 2019-09-22 DIAGNOSIS — I509 Heart failure, unspecified: Secondary | ICD-10-CM | POA: Diagnosis not present

## 2019-09-22 LAB — BASIC METABOLIC PANEL
Anion gap: 9 (ref 5–15)
BUN: 13 mg/dL (ref 6–20)
CO2: 27 mmol/L (ref 22–32)
Calcium: 8.9 mg/dL (ref 8.9–10.3)
Chloride: 100 mmol/L (ref 98–111)
Creatinine, Ser: 1.1 mg/dL (ref 0.61–1.24)
GFR calc Af Amer: >60 mL/min (ref >60)
GFR calc non Af Amer: >60 mL/min (ref >60)
Glucose, Bld: 98 mg/dL (ref 70–99)
Potassium: 3.8 mmol/L (ref 3.5–5.1)
Sodium: 136 mmol/L (ref 135–145)

## 2019-09-22 LAB — CBC WITH DIFFERENTIAL/PLATELET
Abs Immature Granulocytes: 0.02 10*3/uL (ref 0.00–0.07)
Basophils Absolute: 0.1 10*3/uL (ref 0.0–0.1)
Basophils Relative: 1 %
Eosinophils Absolute: 0.1 10*3/uL (ref 0.0–0.5)
Eosinophils Relative: 2 %
HCT: 42 % (ref 39.0–52.0)
Hemoglobin: 12.6 g/dL — ABNORMAL LOW (ref 13.0–17.0)
Immature Granulocytes: 0 %
Lymphocytes Relative: 25 %
Lymphs Abs: 1.6 10*3/uL (ref 0.7–4.0)
MCH: 21.1 pg — ABNORMAL LOW (ref 26.0–34.0)
MCHC: 30 g/dL (ref 30.0–36.0)
MCV: 70.2 fL — ABNORMAL LOW (ref 80.0–100.0)
Monocytes Absolute: 0.6 10*3/uL (ref 0.1–1.0)
Monocytes Relative: 10 %
Neutro Abs: 3.8 10*3/uL (ref 1.7–7.7)
Neutrophils Relative %: 62 %
Platelets: 439 10*3/uL — ABNORMAL HIGH (ref 150–400)
RBC: 5.98 MIL/uL — ABNORMAL HIGH (ref 4.22–5.81)
RDW: 18.1 % — ABNORMAL HIGH (ref 11.5–15.5)
WBC: 6.1 10*3/uL (ref 4.0–10.5)
nRBC: 0 % (ref 0.0–0.2)

## 2019-09-22 LAB — IRON AND TIBC
Iron: 31 ug/dL — ABNORMAL LOW (ref 45–182)
Saturation Ratios: 8 % — ABNORMAL LOW (ref 17.9–39.5)
TIBC: 407 ug/dL (ref 250–450)
UIBC: 376 ug/dL

## 2019-09-22 LAB — FERRITIN: Ferritin: 9 ng/mL — ABNORMAL LOW (ref 24–336)

## 2019-09-23 ENCOUNTER — Encounter
Admit: 2019-09-23 | Discharge: 2019-09-24 | Disposition: A | Discharge status: Home / Self Care | Payer: BLUE CROSS/BLUE SHIELD

## 2019-09-23 ENCOUNTER — Emergency Department
Admit: 2019-09-23 | Discharge: 2019-09-24 | Disposition: A | Discharge status: Home / Self Care | Payer: BLUE CROSS/BLUE SHIELD

## 2019-09-23 DIAGNOSIS — Z87891 Personal history of nicotine dependence: Secondary | ICD-10-CM | POA: Diagnosis not present

## 2019-09-23 DIAGNOSIS — F419 Anxiety disorder, unspecified: Secondary | ICD-10-CM | POA: Diagnosis not present

## 2019-09-23 DIAGNOSIS — S60221A Contusion of right hand, initial encounter: Secondary | ICD-10-CM | POA: Diagnosis not present

## 2019-09-23 DIAGNOSIS — R079 Chest pain, unspecified: Secondary | ICD-10-CM | POA: Diagnosis not present

## 2019-09-23 DIAGNOSIS — Z955 Presence of coronary angioplasty implant and graft: Secondary | ICD-10-CM | POA: Diagnosis not present

## 2019-09-23 DIAGNOSIS — I10 Essential (primary) hypertension: Secondary | ICD-10-CM | POA: Diagnosis not present

## 2019-09-23 DIAGNOSIS — S6991XA Unspecified injury of right wrist, hand and finger(s), initial encounter: Secondary | ICD-10-CM | POA: Diagnosis not present

## 2019-09-23 DIAGNOSIS — R0789 Other chest pain: Secondary | ICD-10-CM | POA: Diagnosis not present

## 2019-09-23 DIAGNOSIS — R002 Palpitations: Secondary | ICD-10-CM | POA: Diagnosis not present

## 2019-09-23 DIAGNOSIS — I2510 Atherosclerotic heart disease of native coronary artery without angina pectoris: Secondary | ICD-10-CM | POA: Diagnosis not present

## 2019-09-23 DIAGNOSIS — R61 Generalized hyperhidrosis: Secondary | ICD-10-CM | POA: Diagnosis not present

## 2019-09-23 DIAGNOSIS — I4891 Unspecified atrial fibrillation: Secondary | ICD-10-CM | POA: Diagnosis not present

## 2019-09-23 DIAGNOSIS — J45909 Unspecified asthma, uncomplicated: Secondary | ICD-10-CM | POA: Diagnosis not present

## 2019-09-23 DIAGNOSIS — E785 Hyperlipidemia, unspecified: Secondary | ICD-10-CM | POA: Diagnosis not present

## 2019-09-23 DIAGNOSIS — M79641 Pain in right hand: Secondary | ICD-10-CM | POA: Diagnosis not present

## 2019-09-23 DIAGNOSIS — R000 Tachycardia, unspecified: Secondary | ICD-10-CM | POA: Diagnosis not present

## 2019-09-23 DIAGNOSIS — R609 Edema, unspecified: Secondary | ICD-10-CM | POA: Diagnosis not present

## 2019-09-23 DIAGNOSIS — I252 Old myocardial infarction: Secondary | ICD-10-CM | POA: Diagnosis not present

## 2019-09-23 DIAGNOSIS — M79669 Pain in unspecified lower leg: Secondary | ICD-10-CM | POA: Diagnosis not present

## 2019-09-23 LAB — IGG, IGA, IGM
IgA: 364 mg/dL (ref 90–386)
IgG (Immunoglobin G), Serum: 2404 mg/dL — ABNORMAL HIGH (ref 603–1613)
IgM (Immunoglobulin M), Srm: 166 mg/dL (ref 20–172)

## 2019-09-23 LAB — CBC W/ AUTO DIFF
BASOPHILS ABSOLUTE COUNT: 0.1 10*9/L (ref 0.0–0.1)
BASOPHILS RELATIVE PERCENT: 1 %
EOSINOPHILS ABSOLUTE COUNT: 0.1 10*9/L (ref 0.0–0.7)
EOSINOPHILS RELATIVE PERCENT: 1 %
HEMATOCRIT: 37.3 % — ABNORMAL LOW (ref 38.0–50.0)
HEMOGLOBIN: 11.8 g/dL — ABNORMAL LOW (ref 13.5–17.5)
LYMPHOCYTES ABSOLUTE COUNT: 1.7 10*9/L (ref 0.7–4.0)
LYMPHOCYTES RELATIVE PERCENT: 23.1 %
MEAN CORPUSCULAR HEMOGLOBIN CONC: 31.6 g/dL (ref 30.0–36.0)
MEAN CORPUSCULAR HEMOGLOBIN: 21.4 pg — ABNORMAL LOW (ref 26.0–34.0)
MEAN CORPUSCULAR VOLUME: 67.8 fL — ABNORMAL LOW (ref 81.0–95.0)
MEAN PLATELET VOLUME: 7.5 fL (ref 7.0–10.0)
MONOCYTES ABSOLUTE COUNT: 1 10*9/L (ref 0.1–1.0)
MONOCYTES RELATIVE PERCENT: 13.3 %
NEUTROPHILS RELATIVE PERCENT: 61.6 %
NUCLEATED RED BLOOD CELLS: 0 /100{WBCs} (ref <=4)
PLATELET COUNT: 463 10*9/L — ABNORMAL HIGH (ref 150–450)
RED BLOOD CELL COUNT: 5.5 10*12/L (ref 4.32–5.72)
RED CELL DISTRIBUTION WIDTH: 17.3 % — ABNORMAL HIGH (ref 12.0–15.0)
WBC ADJUSTED: 7.2 10*9/L (ref 3.5–10.5)

## 2019-09-23 LAB — MONOCYTES ABSOLUTE COUNT: Monocytes:NCnc:Pt:Bld:Qn:Automated count: 1

## 2019-09-23 LAB — COMPREHENSIVE METABOLIC PANEL
ALKALINE PHOSPHATASE: 135 U/L — ABNORMAL HIGH (ref 46–116)
ALT (SGPT): 38 U/L (ref 10–49)
ANION GAP: 4 mmol/L (ref 3–11)
AST (SGOT): 29 U/L (ref <34)
BILIRUBIN TOTAL: 0.2 mg/dL — ABNORMAL LOW (ref 0.3–1.2)
BLOOD UREA NITROGEN: 14 mg/dL (ref 9–23)
BUN / CREAT RATIO: 11
CALCIUM: 9.3 mg/dL (ref 8.7–10.4)
CHLORIDE: 106 mmol/L (ref 98–107)
CREATININE: 1.25 mg/dL — ABNORMAL HIGH (ref 0.60–1.10)
EGFR CKD-EPI AA MALE: 81 mL/min/{1.73_m2}
EGFR CKD-EPI NON-AA MALE: 70 mL/min/{1.73_m2}
GLUCOSE RANDOM: 87 mg/dL (ref 70–179)
POTASSIUM: 3.6 mmol/L (ref 3.5–5.1)
PROTEIN TOTAL: 8.5 g/dL — ABNORMAL HIGH (ref 5.7–8.2)
SODIUM: 137 mmol/L (ref 135–145)

## 2019-09-23 LAB — ALBUMIN: Albumin:MCnc:Pt:Ser/Plas:Qn:: 3.7

## 2019-09-23 LAB — TROPONIN I: Troponin I.cardiac:MCnc:Pt:Ser/Plas:Qn:: <0.06

## 2019-09-23 LAB — LIPASE: Triacylglycerol lipase:CCnc:Pt:Ser/Plas:Qn:: 26

## 2019-09-24 DIAGNOSIS — R079 Chest pain, unspecified: Secondary | ICD-10-CM | POA: Diagnosis not present

## 2019-09-24 LAB — TROPONIN I: Troponin I.cardiac:MCnc:Pt:Ser/Plas:Qn:: <0.06

## 2019-09-24 MED ADMIN — sucralfate (CARAFATE) oral suspension: 1 g | ORAL | @ 04:00:00 | Stop: 2019-09-23

## 2019-09-24 MED ADMIN — aluminum-magnesium hydroxide-simethicone (MAALOX MAX) 80-80-8 mg/mL oral suspension: 30 mL | ORAL | @ 04:00:00 | Stop: 2019-09-23

## 2019-09-24 MED ADMIN — lidocaine (XYLOCAINE) 2% viscous mucosal solution: 10 mL | ORAL | @ 04:00:00 | Stop: 2019-09-23

## 2019-09-24 MED ADMIN — aspirin chewable tablet 324 mg: 324 mg | ORAL | @ 04:00:00 | Stop: 2019-09-23
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
## 2019-09-25 ENCOUNTER — Other Ambulatory Visit: Payer: Self-pay | Admitting: Family Medicine

## 2019-09-25 DIAGNOSIS — Z79899 Other long term (current) drug therapy: Secondary | ICD-10-CM

## 2019-09-25 DIAGNOSIS — Z79891 Long term (current) use of opiate analgesic: Secondary | ICD-10-CM

## 2019-09-25 DIAGNOSIS — G8929 Other chronic pain: Secondary | ICD-10-CM

## 2019-09-25 LAB — KAPPA/LAMBDA LIGHT CHAINS
Kappa free light chain: 68.9 mg/L — ABNORMAL HIGH (ref 3.3–19.4)
Kappa, lambda light chain ratio: 2.32 — ABNORMAL HIGH (ref 0.26–1.65)
Lambda free light chains: 29.7 mg/L — ABNORMAL HIGH (ref 5.7–26.3)

## 2019-09-26 ENCOUNTER — Other Ambulatory Visit: Payer: Self-pay

## 2019-09-26 ENCOUNTER — Emergency Department
Admission: EM | Admit: 2019-09-26 | Discharge: 2019-09-26 | Disposition: A | Discharge status: Home / Self Care | Payer: BC Managed Care – PPO | Attending: Student in an Organized Health Care Education/Training Program | Admitting: Student in an Organized Health Care Education/Training Program

## 2019-09-26 ENCOUNTER — Emergency Department: Payer: BC Managed Care – PPO

## 2019-09-26 ENCOUNTER — Encounter: Payer: Self-pay | Admitting: Oncology

## 2019-09-26 DIAGNOSIS — Y929 Unspecified place or not applicable: Secondary | ICD-10-CM | POA: Diagnosis not present

## 2019-09-26 DIAGNOSIS — I2510 Atherosclerotic heart disease of native coronary artery without angina pectoris: Secondary | ICD-10-CM | POA: Diagnosis not present

## 2019-09-26 DIAGNOSIS — I509 Heart failure, unspecified: Secondary | ICD-10-CM | POA: Diagnosis not present

## 2019-09-26 DIAGNOSIS — Y999 Unspecified external cause status: Secondary | ICD-10-CM | POA: Insufficient documentation

## 2019-09-26 DIAGNOSIS — R52 Pain, unspecified: Secondary | ICD-10-CM

## 2019-09-26 DIAGNOSIS — J45909 Unspecified asthma, uncomplicated: Secondary | ICD-10-CM | POA: Insufficient documentation

## 2019-09-26 DIAGNOSIS — M25512 Pain in left shoulder: Secondary | ICD-10-CM | POA: Diagnosis not present

## 2019-09-26 DIAGNOSIS — Z955 Presence of coronary angioplasty implant and graft: Secondary | ICD-10-CM | POA: Diagnosis not present

## 2019-09-26 DIAGNOSIS — Y93I9 Activity, other involving external motion: Secondary | ICD-10-CM | POA: Insufficient documentation

## 2019-09-26 DIAGNOSIS — S4992XA Unspecified injury of left shoulder and upper arm, initial encounter: Secondary | ICD-10-CM | POA: Diagnosis not present

## 2019-09-26 DIAGNOSIS — S46912A Strain of unspecified muscle, fascia and tendon at shoulder and upper arm level, left arm, initial encounter: Secondary | ICD-10-CM | POA: Diagnosis not present

## 2019-09-26 DIAGNOSIS — I110 Hypertensive heart disease with heart failure: Secondary | ICD-10-CM | POA: Diagnosis not present

## 2019-09-26 DIAGNOSIS — Z87891 Personal history of nicotine dependence: Secondary | ICD-10-CM | POA: Insufficient documentation

## 2019-09-26 DIAGNOSIS — X501XXA Overexertion from prolonged static or awkward postures, initial encounter: Secondary | ICD-10-CM | POA: Diagnosis not present

## 2019-09-26 DIAGNOSIS — Z8616 Personal history of COVID-19: Secondary | ICD-10-CM | POA: Diagnosis not present

## 2019-09-26 MED ORDER — LIDOCAINE 5 % EX PTCH
1.0000 | MEDICATED_PATCH | CUTANEOUS | Status: DC
  Administered 2019-09-26: 1 via TRANSDERMAL
  Filled 2019-09-26: qty 1

## 2019-09-26 MED ORDER — LIDOCAINE 5 % EX PTCH: 1.0000 | MEDICATED_PATCH | Freq: Two times a day (BID) | CUTANEOUS | 0 refills | Status: DC

## 2019-09-26 MED ORDER — HYDROCODONE-ACETAMINOPHEN 5-325 MG PO TABS: 1.0000 | ORAL_TABLET | Freq: Two times a day (BID) | ORAL | 0 refills | Status: DC | PRN

## 2019-09-26 MED ORDER — NAPROXEN 500 MG PO TABS
500.0000 mg | ORAL_TABLET | Freq: Once | ORAL | Status: AC
  Administered 2019-09-26: 500 mg via ORAL
  Filled 2019-09-26: qty 1

## 2019-09-26 MED ORDER — LIDOCAINE 5 % TOPICAL PATCH
TRANSDERMAL | 0 days
Start: 2019-09-26 — End: 2020-09-25

## 2019-09-26 NOTE — Discharge Instructions (Signed)
Follow discharge care instructions and change Lidoderm patches every 12 hours.

## 2019-09-26 NOTE — ED Notes (Signed)
See triage note  Presents with left shoulder pain     States he has had intermittent left upper chest discomfort also  But denies any chest pain at present  conts to have pain to left shoulder  No injury  No deformity   Good ROM

## 2019-09-26 NOTE — ED Triage Notes (Signed)
Pt arrives to ED via POV from home with c/o left shoulder pain x3 days. No recent fall, trauma, or injury; CMS and pt exhibits full ROM in affected arm. Pt is A&O, in NAD; RR even, regular, and unlabored.
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
## 2019-09-29 NOTE — Unmapped (Signed)
[facility] Clinical Assessment & Refill Coordination Note    Ryan Hale, DOB: 09/19/1975  Phone: [phone] (home)     All above HIPAA information was verified with patient.     Was a Nurse, learning disability used for this call? No    Specialty Medication(s):   General Specialty: Repatha     Current Outpatient Medications   Medication Sig Dispense Refill   ??? acetaminophen (TYLENOL) 500 MG tablet Take 500 mg by mouth every four (4) hours as needed.      ??? albuterol HFA 90 mcg/actuation inhaler Inhale 2 puffs every six (6) hours as needed.      ??? aspirin 81 MG chewable tablet Chew 1 tablet (81 mg total) daily. 30 tablet 11   ??? atorvastatin (LIPITOR) 10 MG tablet Take 1 tablet (10 mg total) by mouth daily. 30 tablet 6   ??? budesonide (PULMICORT) 90 mcg/actuation inhaler Inhale 1 puff two (2) times a day as needed.      ??? calcium carbonate 300 mg (750 mg) Chew Antacid Extra-Strength 300 mg (750 mg) chewable tablet   take 2 tablets by mouth three times a day if needed for heart BURN     ??? chlorthalidone (HYGROTON) 25 MG tablet TAKE 1 TABLET(25 MG) BY MOUTH DAILY 30 tablet 6   ??? DEXILANT 60 mg capsule daily.      ??? diclofenac sodium (VOLTAREN) 1 % gel Apply 2 g topically two (2) times a day as needed.      ??? evolocumab (REPATHA SYRINGE) 140 mg/mL Syrg Inject the contents of 1 syringe (140 mg) under the skin every fourteen (14) days. 2 mL 5   ??? ezetimibe (ZETIA) 10 mg tablet Take 1 tablet (10 mg total) by mouth daily. 90 tablet 3   ??? famotidine (PEPCID) 20 MG tablet Take 1 tablet (20 mg total) by mouth Two (2) times a day. (Patient taking differently: Take 40 mg by mouth two (2) times a day as needed. ) 60 tablet 0   ??? fluticasone propionate (FLONASE) 50 mcg/actuation nasal spray daily as needed.      ??? HYDROcodone-acetaminophen (NORCO) 5-325 mg per tablet Take 0.5-1 tablets by mouth every six (6) hours as needed.  (Patient not taking: Reported on 09/29/2019)     ??? loratadine (CLARITIN) 10 mg tablet Take 10 mg by mouth daily as needed.      ??? MUCINEX DM 60-1,200 mg Tb12 Take 1 tablet by mouth two (2) times a day as needed.      ??? nitroglycerin (NITROSTAT) 0.4 MG SL tablet Place 1 tablet (0.4 mg total) under the tongue every five (5) minutes as needed for chest pain. 30 tablet 2   ??? omeprazole (PRILOSEC) 20 MG capsule Take 1 capsule (20 mg total) by mouth daily for 14 days. 14 capsule 0   ??? pregabalin (LYRICA) 25 MG capsule Take 25 mg poqam and 50 mg poqpm for chronic pain     ??? sertraline (ZOLOFT) 25 MG tablet Take 25 mg by mouth daily.     ??? TiZANidine (ZANAFLEX) 4 MG capsule Take 4 mg by mouth Three (3) times a day as needed.        No current facility-administered medications for this visit.        Changes to medications: Ryan Hale Reports stopping the following medications: Hydrocodone    Allergies   Allergen Reactions   ??? Ace Inhibitors Swelling   ??? Lisinopril Swelling   ??? Norvasc [Amlodipine] Palpitations  Changes to allergies: No    SPECIALTY MEDICATION ADHERENCE     Repatha 140 mg/ml: 0 days of medicine on hand     Medication Adherence    Patient reported X missed doses in the last month: 0  Specialty Medication: Repatha 140 mg/ml  Informant: patient  Confirmed plan for next specialty medication refill: delivery by pharmacy  Refills needed for supportive medications: not needed          Specialty medication(s) dose(s) confirmed: Regimen is correct and unchanged.     Are there any concerns with adherence? No    Adherence counseling provided? Not needed    CLINICAL MANAGEMENT AND INTERVENTION      Clinical Benefit Assessment:    Do you feel the medicine is effective or helping your condition? Yes    Clinical Benefit counseling provided? Labs from 07/31/19 show evidence of clinical benefit    Adverse Effects Assessment:    Are you experiencing any side effects? No    Are you experiencing difficulty administering your medicine? No    Quality of Life Assessment:    How many days over the past month did your high cholesterol  keep you from your normal activities? For example, brushing your teeth or getting up in the morning. 0    Have you discussed this with your provider? Not needed    Therapy Appropriateness:    Is therapy appropriate? Yes, therapy is appropriate and should be continued    DISEASE/MEDICATION-SPECIFIC INFORMATION      For patients on injectable medications: Patient currently has 0 doses left.  Next injection is scheduled for 10/05/19.    PATIENT SPECIFIC NEEDS     - Does the patient have any physical, cognitive, or cultural barriers? No    - Is the patient high risk? No     - Does the patient require a Care Management Plan? No     - Does the patient require physician intervention or other additional services (i.e. nutrition, smoking cessation, social work)? No      SHIPPING     Specialty Medication(s) to be Shipped:   General Specialty: Repatha    Other medication(s) to be shipped: n/a     Changes to insurance: No    Delivery Scheduled: Yes, Expected medication delivery date: 10/02/19.     Medication will be delivered via Same Day Courier to the confirmed prescription address in [facility].    The patient will receive a drug information handout for each medication shipped and additional FDA Medication Guides as required.  Verified that patient has previously received a Conservation officer, historic buildings.    All of the patient's questions and concerns have been addressed.    Ryan Hale   [facility] Specialty Pharmacist

## 2019-10-02 MED FILL — REPATHA SYRINGE 140 MG/ML SUBCUTANEOUS SYRINGE: 28 days supply | Qty: 2 | Fill #1 | Status: AC

## 2019-10-02 MED FILL — REPATHA SYRINGE 140 MG/ML SUBCUTANEOUS SYRINGE: SUBCUTANEOUS | 28 days supply | Qty: 2 | Fill #1

## 2019-10-03 MED ORDER — TADALAFIL 5 MG TABLET
ORAL_TABLET | Freq: Every day | ORAL | 3 refills | 10.00000 days | Status: CP | PRN
Start: 2019-10-03 — End: 2019-11-02

## 2019-10-07 DIAGNOSIS — Z955 Presence of coronary angioplasty implant and graft: Secondary | ICD-10-CM | POA: Diagnosis not present

## 2019-10-07 DIAGNOSIS — R110 Nausea: Secondary | ICD-10-CM | POA: Diagnosis not present

## 2019-10-07 DIAGNOSIS — Z6832 Body mass index (BMI) 32.0-32.9, adult: Secondary | ICD-10-CM | POA: Diagnosis not present

## 2019-10-07 DIAGNOSIS — R0789 Other chest pain: Secondary | ICD-10-CM | POA: Diagnosis not present

## 2019-10-07 DIAGNOSIS — I252 Old myocardial infarction: Secondary | ICD-10-CM | POA: Diagnosis not present

## 2019-10-07 DIAGNOSIS — J45909 Unspecified asthma, uncomplicated: Secondary | ICD-10-CM | POA: Diagnosis not present

## 2019-10-07 DIAGNOSIS — Z7951 Long term (current) use of inhaled steroids: Secondary | ICD-10-CM | POA: Diagnosis not present

## 2019-10-07 DIAGNOSIS — I2510 Atherosclerotic heart disease of native coronary artery without angina pectoris: Secondary | ICD-10-CM | POA: Diagnosis not present

## 2019-10-07 DIAGNOSIS — Z79899 Other long term (current) drug therapy: Secondary | ICD-10-CM | POA: Diagnosis not present

## 2019-10-07 DIAGNOSIS — Z87891 Personal history of nicotine dependence: Secondary | ICD-10-CM | POA: Diagnosis not present

## 2019-10-07 DIAGNOSIS — R079 Chest pain, unspecified: Secondary | ICD-10-CM | POA: Diagnosis not present

## 2019-10-07 DIAGNOSIS — F419 Anxiety disorder, unspecified: Secondary | ICD-10-CM | POA: Diagnosis not present

## 2019-10-07 DIAGNOSIS — Z7982 Long term (current) use of aspirin: Secondary | ICD-10-CM | POA: Diagnosis not present

## 2019-10-07 DIAGNOSIS — E785 Hyperlipidemia, unspecified: Secondary | ICD-10-CM | POA: Diagnosis not present

## 2019-10-07 DIAGNOSIS — I4891 Unspecified atrial fibrillation: Secondary | ICD-10-CM | POA: Diagnosis not present

## 2019-10-07 DIAGNOSIS — I10 Essential (primary) hypertension: Secondary | ICD-10-CM | POA: Diagnosis not present

## 2019-10-07 DIAGNOSIS — R0602 Shortness of breath: Secondary | ICD-10-CM | POA: Diagnosis not present

## 2019-10-07 LAB — CBC W/ AUTO DIFF
BASOPHILS ABSOLUTE COUNT: 0.1 10*9/L (ref 0.0–0.1)
BASOPHILS RELATIVE PERCENT: 1.1 %
EOSINOPHILS ABSOLUTE COUNT: 0.1 10*9/L (ref 0.0–0.7)
EOSINOPHILS RELATIVE PERCENT: 1.9 %
HEMATOCRIT: 38.2 % (ref 38.0–50.0)
LYMPHOCYTES ABSOLUTE COUNT: 2 10*9/L (ref 0.7–4.0)
LYMPHOCYTES RELATIVE PERCENT: 25.6 %
MEAN CORPUSCULAR HEMOGLOBIN CONC: 31.6 g/dL (ref 30.0–36.0)
MEAN CORPUSCULAR HEMOGLOBIN: 21.4 pg — ABNORMAL LOW (ref 26.0–34.0)
MEAN PLATELET VOLUME: 7.5 fL (ref 7.0–10.0)
MONOCYTES ABSOLUTE COUNT: 0.9 10*9/L (ref 0.1–1.0)
MONOCYTES RELATIVE PERCENT: 12 %
NEUTROPHILS RELATIVE PERCENT: 59.4 %
NUCLEATED RED BLOOD CELLS: 0 /100{WBCs} (ref <=4)
PLATELET COUNT: 469 10*9/L — ABNORMAL HIGH (ref 150–450)
RED BLOOD CELL COUNT: 5.64 10*12/L (ref 4.32–5.72)
RED CELL DISTRIBUTION WIDTH: 17.7 % — ABNORMAL HIGH (ref 12.0–15.0)
WBC ADJUSTED: 7.7 10*9/L (ref 3.5–10.5)

## 2019-10-07 LAB — COMPREHENSIVE METABOLIC PANEL
ALBUMIN: 3.8 g/dL (ref 3.4–5.0)
ALKALINE PHOSPHATASE: 140 U/L — ABNORMAL HIGH (ref 46–116)
ANION GAP: 6 mmol/L (ref 3–11)
BILIRUBIN TOTAL: 0.3 mg/dL (ref 0.3–1.2)
BUN / CREAT RATIO: 10
CALCIUM: 9.3 mg/dL (ref 8.7–10.4)
CHLORIDE: 104 mmol/L (ref 98–107)
CO2: 25.7 mmol/L (ref 20.0–31.0)
CREATININE: 1.15 mg/dL — ABNORMAL HIGH (ref 0.60–1.10)
EGFR CKD-EPI AA MALE: 90 mL/min/{1.73_m2}
EGFR CKD-EPI NON-AA MALE: 78 mL/min/{1.73_m2}
GLUCOSE RANDOM: 101 mg/dL (ref 70–179)
POTASSIUM: 3.4 mmol/L — ABNORMAL LOW (ref 3.5–5.1)
PROTEIN TOTAL: 8.8 g/dL — ABNORMAL HIGH (ref 5.7–8.2)
SODIUM: 136 mmol/L (ref 135–145)

## 2019-10-07 LAB — MEAN CORPUSCULAR VOLUME: Erythrocyte mean corpuscular volume:EntVol:Pt:RBC:Qn:Automated count: 67.8 — ABNORMAL LOW

## 2019-10-07 LAB — TROPONIN I: Troponin I.cardiac:MCnc:Pt:Ser/Plas:Qn:: <0.06

## 2019-10-07 LAB — AST (SGOT): Aspartate aminotransferase:CCnc:Pt:Ser/Plas:Qn:: 26

## 2019-10-08 ENCOUNTER — Encounter
Admit: 2019-10-08 | Discharge: 2019-10-08 | Disposition: A | Discharge status: Home / Self Care | Payer: BLUE CROSS/BLUE SHIELD

## 2019-10-08 ENCOUNTER — Emergency Department
Admit: 2019-10-08 | Discharge: 2019-10-08 | Disposition: A | Discharge status: Home / Self Care | Payer: BLUE CROSS/BLUE SHIELD

## 2019-10-08 LAB — TROPONIN I: Troponin I.cardiac:MCnc:Pt:Ser/Plas:Qn:: <0.06

## 2019-10-08 MED ADMIN — lidocaine (XYLOCAINE) 2% viscous mucosal solution: 10 mL | ORAL | @ 03:00:00 | Stop: 2019-10-07

## 2019-10-08 MED ADMIN — ketorolac (TORADOL) injection 15 mg: 15 mg | INTRAMUSCULAR | @ 03:00:00 | Stop: 2019-10-07

## 2019-10-08 MED ADMIN — lidocaine (LIDODERM) 5 % patch 1 patch: 1 | TRANSDERMAL | @ 03:00:00

## 2019-10-08 MED ADMIN — famotidine (PEPCID) tablet 20 mg: 20 mg | ORAL | @ 03:00:00 | Stop: 2019-10-07

## 2019-10-08 MED ADMIN — sucralfate (CARAFATE) oral suspension: 1 g | ORAL | @ 03:00:00 | Stop: 2019-10-07

## 2019-10-08 MED ADMIN — aluminum-magnesium hydroxide-simethicone (MAALOX MAX) 80-80-8 mg/mL oral suspension: 30 mL | ORAL | @ 03:00:00 | Stop: 2019-10-07
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
## 2019-10-08 NOTE — Unmapped (Signed)
Pt stating chest tightness and SOB that started out of the blue. Pt stating this has been occurring the last couple of hours.

## 2019-10-08 NOTE — Unmapped (Shared)
[facility]  Emergency Department Progress Note    I assumed care of this patient from Dr. Vincent Peyer at 11:00 PM.    Briefly, LAVAN IMES is a 44 y.o. male with a PMH of A.fib (on aspirin), MI (s/p left heart catheter, 2019), CAD (s/p stent, 2019), HLD, and HTN presenting to the ED for evaluation of non exertional shortness of breath along with back pain in the context of stopping his hydrocodone prescription 5 days ago.     VS were unremarkable. Cardiopulmonary was unremarkable. EKG was normal. Trop negative. Treated with toradol, lidocaine patch, pepcid, and GI cocktail. Pending second trop.          Attestations     Documentation assistance was provided by Lenward Chancellor, Scribe, on October 07, 2019 at 11:19 PM for Idalia Needle, MD.    {*** NOTE TO PROVIDER: PLEASE ADD EDPROVSCRIBEATTEST NOTING YOU AGREE WITH SCRIBE DOCUMENTATION ***}

## 2019-10-12 ENCOUNTER — Encounter
Admit: 2019-10-12 | Discharge: 2019-10-12 | Disposition: A | Discharge status: Home / Self Care | Payer: BLUE CROSS/BLUE SHIELD

## 2019-10-12 ENCOUNTER — Ambulatory Visit
Admit: 2019-10-12 | Discharge: 2019-10-12 | Disposition: A | Discharge status: Home / Self Care | Payer: BLUE CROSS/BLUE SHIELD

## 2019-10-12 DIAGNOSIS — J069 Acute upper respiratory infection, unspecified: Principal | ICD-10-CM

## 2019-10-12 DIAGNOSIS — J45909 Unspecified asthma, uncomplicated: Secondary | ICD-10-CM | POA: Diagnosis not present

## 2019-10-12 DIAGNOSIS — I4891 Unspecified atrial fibrillation: Secondary | ICD-10-CM | POA: Diagnosis not present

## 2019-10-12 DIAGNOSIS — I10 Essential (primary) hypertension: Secondary | ICD-10-CM | POA: Diagnosis not present

## 2019-10-12 DIAGNOSIS — F419 Anxiety disorder, unspecified: Secondary | ICD-10-CM | POA: Diagnosis not present

## 2019-10-12 DIAGNOSIS — R05 Cough: Secondary | ICD-10-CM | POA: Diagnosis not present

## 2019-10-12 DIAGNOSIS — I252 Old myocardial infarction: Secondary | ICD-10-CM | POA: Diagnosis not present

## 2019-10-12 DIAGNOSIS — E785 Hyperlipidemia, unspecified: Secondary | ICD-10-CM | POA: Diagnosis not present

## 2019-10-12 DIAGNOSIS — R590 Localized enlarged lymph nodes: Secondary | ICD-10-CM | POA: Diagnosis not present

## 2019-10-12 DIAGNOSIS — I2510 Atherosclerotic heart disease of native coronary artery without angina pectoris: Secondary | ICD-10-CM | POA: Diagnosis not present

## 2019-10-12 DIAGNOSIS — Z7982 Long term (current) use of aspirin: Secondary | ICD-10-CM | POA: Diagnosis not present

## 2019-10-12 DIAGNOSIS — Z6833 Body mass index (BMI) 33.0-33.9, adult: Secondary | ICD-10-CM | POA: Diagnosis not present

## 2019-10-12 DIAGNOSIS — J3489 Other specified disorders of nose and nasal sinuses: Secondary | ICD-10-CM | POA: Diagnosis not present

## 2019-10-12 DIAGNOSIS — Z87891 Personal history of nicotine dependence: Secondary | ICD-10-CM | POA: Diagnosis not present

## 2019-10-12 DIAGNOSIS — Z7951 Long term (current) use of inhaled steroids: Secondary | ICD-10-CM | POA: Diagnosis not present

## 2019-10-12 DIAGNOSIS — Z955 Presence of coronary angioplasty implant and graft: Secondary | ICD-10-CM | POA: Diagnosis not present

## 2019-10-12 DIAGNOSIS — R0981 Nasal congestion: Secondary | ICD-10-CM | POA: Diagnosis not present

## 2019-10-12 DIAGNOSIS — R0602 Shortness of breath: Secondary | ICD-10-CM | POA: Diagnosis not present

## 2019-10-12 DIAGNOSIS — R0600 Dyspnea, unspecified: Secondary | ICD-10-CM | POA: Diagnosis not present

## 2019-10-12 DIAGNOSIS — R0789 Other chest pain: Secondary | ICD-10-CM | POA: Diagnosis not present

## 2019-10-12 LAB — BASIC METABOLIC PANEL
ANION GAP: 6 mmol/L (ref 3–11)
BLOOD UREA NITROGEN: 10 mg/dL (ref 9–23)
BUN / CREAT RATIO: 9
CALCIUM: 9.3 mg/dL (ref 8.7–10.4)
CHLORIDE: 105 mmol/L (ref 98–107)
CO2: 27.4 mmol/L (ref 20.0–31.0)
CREATININE: 1.09 mg/dL (ref 0.60–1.10)
EGFR CKD-EPI AA MALE: >90 mL/min/{1.73_m2}
GLUCOSE RANDOM: 112 mg/dL (ref 70–179)
POTASSIUM: 3.8 mmol/L (ref 3.5–5.1)

## 2019-10-12 LAB — CBC W/ AUTO DIFF
BASOPHILS RELATIVE PERCENT: 0.9 %
EOSINOPHILS ABSOLUTE COUNT: 0.1 10*9/L (ref 0.0–0.7)
EOSINOPHILS RELATIVE PERCENT: 1.1 %
HEMATOCRIT: 37.1 % — ABNORMAL LOW (ref 38.0–50.0)
HEMOGLOBIN: 11.9 g/dL — ABNORMAL LOW (ref 13.5–17.5)
LYMPHOCYTES ABSOLUTE COUNT: 1.5 10*9/L (ref 0.7–4.0)
LYMPHOCYTES RELATIVE PERCENT: 17.6 %
MEAN CORPUSCULAR HEMOGLOBIN: 21.5 pg — ABNORMAL LOW (ref 26.0–34.0)
MEAN CORPUSCULAR VOLUME: 67.3 fL — ABNORMAL LOW (ref 81.0–95.0)
MEAN PLATELET VOLUME: 7.5 fL (ref 7.0–10.0)
MONOCYTES ABSOLUTE COUNT: 0.8 10*9/L (ref 0.1–1.0)
MONOCYTES RELATIVE PERCENT: 9.3 %
NEUTROPHILS ABSOLUTE COUNT: 6 10*9/L (ref 1.7–7.7)
NEUTROPHILS RELATIVE PERCENT: 71.1 %
NUCLEATED RED BLOOD CELLS: 0 /100{WBCs} (ref <=4)
RED BLOOD CELL COUNT: 5.51 10*12/L (ref 4.32–5.72)
RED CELL DISTRIBUTION WIDTH: 18 % — ABNORMAL HIGH (ref 12.0–15.0)
WBC ADJUSTED: 8.4 10*9/L (ref 3.5–10.5)

## 2019-10-12 LAB — TROPONIN I: Troponin I.cardiac:MCnc:Pt:Ser/Plas:Qn:: <0.06

## 2019-10-12 LAB — SODIUM: Sodium:SCnc:Pt:Ser/Plas:Qn:: 138

## 2019-10-12 LAB — HEMATOCRIT: Hematocrit:VFr:Pt:Bld:Qn:: 37.1 — ABNORMAL LOW

## 2019-10-12 MED ORDER — FLUTICASONE PROPIONATE 50 MCG/ACTUATION NASAL SPRAY,SUSPENSION
Freq: Every day | NASAL | 0 refills | 0.00000 days | Status: CP
Start: 2019-10-12 — End: 2020-10-11

## 2019-10-12 MED ADMIN — pseudoephedrine (SUDAFED) tablet 60 mg: 60 mg | ORAL | @ 19:00:00 | Stop: 2019-10-12

## 2019-10-12 NOTE — Unmapped (Signed)
Pt reports congestion, productive cough with green sputum, left sided CP, SOB x 2 days.    Pt didn't try to take his Nitroglycerin for the CP.
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
## 2019-10-18 ENCOUNTER — Encounter
Admit: 2019-10-18 | Discharge: 2019-10-18 | Disposition: A | Discharge status: Home / Self Care | Payer: BLUE CROSS/BLUE SHIELD | Attending: Emergency Medicine

## 2019-10-18 DIAGNOSIS — R05 Cough: Principal | ICD-10-CM

## 2019-10-18 DIAGNOSIS — Z6832 Body mass index (BMI) 32.0-32.9, adult: Secondary | ICD-10-CM | POA: Diagnosis not present

## 2019-10-18 DIAGNOSIS — R0981 Nasal congestion: Secondary | ICD-10-CM | POA: Diagnosis not present

## 2019-10-18 DIAGNOSIS — Z7951 Long term (current) use of inhaled steroids: Secondary | ICD-10-CM | POA: Diagnosis not present

## 2019-10-18 DIAGNOSIS — I4891 Unspecified atrial fibrillation: Secondary | ICD-10-CM | POA: Diagnosis not present

## 2019-10-18 DIAGNOSIS — I10 Essential (primary) hypertension: Secondary | ICD-10-CM | POA: Diagnosis not present

## 2019-10-18 DIAGNOSIS — Z79899 Other long term (current) drug therapy: Secondary | ICD-10-CM | POA: Diagnosis not present

## 2019-10-18 DIAGNOSIS — I252 Old myocardial infarction: Secondary | ICD-10-CM | POA: Diagnosis not present

## 2019-10-18 DIAGNOSIS — F419 Anxiety disorder, unspecified: Secondary | ICD-10-CM | POA: Diagnosis not present

## 2019-10-18 DIAGNOSIS — J45909 Unspecified asthma, uncomplicated: Secondary | ICD-10-CM | POA: Diagnosis not present

## 2019-10-18 DIAGNOSIS — Z87891 Personal history of nicotine dependence: Secondary | ICD-10-CM | POA: Diagnosis not present

## 2019-10-18 DIAGNOSIS — I2510 Atherosclerotic heart disease of native coronary artery without angina pectoris: Secondary | ICD-10-CM | POA: Diagnosis not present

## 2019-10-18 DIAGNOSIS — Z20822 Contact with and (suspected) exposure to covid-19: Secondary | ICD-10-CM | POA: Diagnosis not present

## 2019-10-18 DIAGNOSIS — Z7982 Long term (current) use of aspirin: Secondary | ICD-10-CM | POA: Diagnosis not present

## 2019-10-18 DIAGNOSIS — E785 Hyperlipidemia, unspecified: Secondary | ICD-10-CM | POA: Diagnosis not present

## 2019-10-18 MED ORDER — BENZONATATE 100 MG CAPSULE
ORAL_CAPSULE | Freq: Three times a day (TID) | ORAL | 0 refills | 7.00000 days | Status: CP
Start: 2019-10-18 — End: 2019-10-25

## 2019-10-18 MED ORDER — BENZONATATE 100 MG CAPSULE: 100 mg | capsule | Freq: Three times a day (TID) | 0 refills | 7 days | Status: AC

## 2019-10-18 NOTE — Unmapped (Signed)
Pt ambulatory to triage for URI, seen recently for same. Using afrin and inhaler with out relief.

## 2019-10-20 DIAGNOSIS — Z955 Presence of coronary angioplasty implant and graft: Secondary | ICD-10-CM | POA: Diagnosis not present

## 2019-10-20 DIAGNOSIS — I10 Essential (primary) hypertension: Secondary | ICD-10-CM | POA: Diagnosis not present

## 2019-10-20 DIAGNOSIS — Z7951 Long term (current) use of inhaled steroids: Secondary | ICD-10-CM | POA: Diagnosis not present

## 2019-10-20 DIAGNOSIS — R110 Nausea: Secondary | ICD-10-CM | POA: Diagnosis not present

## 2019-10-20 DIAGNOSIS — Z79899 Other long term (current) drug therapy: Secondary | ICD-10-CM | POA: Diagnosis not present

## 2019-10-20 DIAGNOSIS — Z87891 Personal history of nicotine dependence: Secondary | ICD-10-CM | POA: Diagnosis not present

## 2019-10-20 DIAGNOSIS — F419 Anxiety disorder, unspecified: Secondary | ICD-10-CM | POA: Diagnosis not present

## 2019-10-20 DIAGNOSIS — R0602 Shortness of breath: Secondary | ICD-10-CM | POA: Diagnosis not present

## 2019-10-20 DIAGNOSIS — R0600 Dyspnea, unspecified: Secondary | ICD-10-CM | POA: Diagnosis not present

## 2019-10-20 DIAGNOSIS — J3489 Other specified disorders of nose and nasal sinuses: Secondary | ICD-10-CM | POA: Diagnosis not present

## 2019-10-20 DIAGNOSIS — M549 Dorsalgia, unspecified: Secondary | ICD-10-CM | POA: Diagnosis not present

## 2019-10-20 DIAGNOSIS — Z7982 Long term (current) use of aspirin: Secondary | ICD-10-CM | POA: Diagnosis not present

## 2019-10-20 DIAGNOSIS — I4891 Unspecified atrial fibrillation: Secondary | ICD-10-CM | POA: Diagnosis not present

## 2019-10-20 DIAGNOSIS — J45909 Unspecified asthma, uncomplicated: Secondary | ICD-10-CM | POA: Diagnosis not present

## 2019-10-20 DIAGNOSIS — I2510 Atherosclerotic heart disease of native coronary artery without angina pectoris: Secondary | ICD-10-CM | POA: Diagnosis not present

## 2019-10-20 DIAGNOSIS — R05 Cough: Secondary | ICD-10-CM | POA: Diagnosis not present

## 2019-10-20 DIAGNOSIS — E785 Hyperlipidemia, unspecified: Secondary | ICD-10-CM | POA: Diagnosis not present

## 2019-10-20 DIAGNOSIS — I252 Old myocardial infarction: Secondary | ICD-10-CM | POA: Diagnosis not present

## 2019-10-21 ENCOUNTER — Encounter
Admit: 2019-10-21 | Discharge: 2019-10-21 | Disposition: A | Discharge status: Home / Self Care | Payer: BLUE CROSS/BLUE SHIELD

## 2019-10-21 DIAGNOSIS — R0600 Dyspnea, unspecified: Secondary | ICD-10-CM | POA: Diagnosis not present

## 2019-10-21 MED ORDER — GUAIFENESIN 100 MG/5 ML ORAL LIQUID
Freq: Three times a day (TID) | ORAL | 0 refills | 4 days | Status: CP | PRN
Start: 2019-10-21 — End: ?

## 2019-10-21 MED ADMIN — HYDROcodone-acetaminophen (HYCET) 7.5-325 mg/15 mL solution 10 mL: 10 mL | ORAL | @ 06:00:00 | Stop: 2019-10-21

## 2019-10-21 MED ADMIN — ipratropium (ATROVENT) 0.02 % nebulizer solution 500 mcg: 500 ug | RESPIRATORY_TRACT | @ 04:00:00 | Stop: 2019-10-20

## 2019-10-21 MED ADMIN — albuterol 2.5 mg /3 mL (0.083 %) nebulizer solution 5 mg: 5 mg | RESPIRATORY_TRACT | @ 04:00:00 | Stop: 2019-10-20
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
## 2019-10-21 NOTE — Unmapped (Signed)
Seen here 3 times in past 2 weeks for similar issue.  Reports need a breathing treatment.   Coughs when talking.   Was previously given tesslon pearls.      Also reporting some pain in his back.     Denies sick contacts.     Mild productive cough.  Light green with a little bit of blood reported.
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
## 2019-10-27 NOTE — Unmapped (Signed)
[facility] Refill Coordination Note    Specialty Medication(s) to be Shipped:   General Specialty: Repatha    Other medication(s) to be shipped: No additional medications requested for fill at this time     Ryan Hale, DOB: [birth year]  Phone: [phone] (home)       All above HIPAA information was verified with patient.     Was a Nurse, learning disability used for this call? No    Completed refill call assessment today to schedule patient's medication shipment from the [facility] [phone]).       Specialty medication(s) and dose(s) confirmed: Regimen is correct and unchanged.   Changes to medications: Ryan Hale reports no changes at this time.  Changes to insurance: No  Questions for the pharmacist: No    Confirmed patient received Welcome Packet with first shipment. The patient will receive a drug information handout for each medication shipped and additional FDA Medication Guides as required.       DISEASE/MEDICATION-SPECIFIC INFORMATION        For patients on injectable medications: Patient currently has 0 doses left.  Next injection is scheduled for 11/02/19.    SPECIALTY MEDICATION ADHERENCE     Medication Adherence    Patient reported X missed doses in the last month: 0  Specialty Medication: Repatha 140mg /ml  Patient is on additional specialty medications: No          Repatha: 0 days worth of medication on hand.        SHIPPING     Shipping address confirmed in Epic.     Delivery Scheduled: Yes, Expected medication delivery date: 10/30/19.     Medication will be delivered via Same Day Courier to the prescription address in Epic WAM.    Ryan Hale   [facility] Specialty Technician

## 2019-10-30 MED FILL — REPATHA SYRINGE 140 MG/ML SUBCUTANEOUS SYRINGE: 28 days supply | Qty: 2 | Fill #2 | Status: AC

## 2019-10-30 MED FILL — REPATHA SYRINGE 140 MG/ML SUBCUTANEOUS SYRINGE: SUBCUTANEOUS | 28 days supply | Qty: 2 | Fill #2

## 2019-10-31 DIAGNOSIS — I2510 Atherosclerotic heart disease of native coronary artery without angina pectoris: Principal | ICD-10-CM

## 2019-10-31 MED ORDER — EZETIMIBE 10 MG TABLET
ORAL_TABLET | Freq: Every day | ORAL | 3 refills | 90 days | Status: CP
Start: 2019-10-31 — End: 2020-10-30

## 2019-10-31 NOTE — Unmapped (Signed)
Received faxed request for Zetia from pts pharmacy

## 2019-11-02 NOTE — Unmapped (Signed)
Received approval for Zetia Pharmacy notified.  Approved from 11/02/19-11/01/20

## 2019-11-03 DIAGNOSIS — I2510 Atherosclerotic heart disease of native coronary artery without angina pectoris: Principal | ICD-10-CM

## 2019-11-03 MED ORDER — EZETIMIBE 10 MG TABLET
ORAL_TABLET | Freq: Every day | ORAL | 3 refills | 90.00000 days | Status: CP
Start: 2019-11-03 — End: 2020-11-02

## 2019-11-13 ENCOUNTER — Emergency Department
Admit: 2019-11-13 | Discharge: 2019-11-13 | Disposition: A | Discharge status: Home / Self Care | Payer: BLUE CROSS/BLUE SHIELD | Attending: Emergency Medicine

## 2019-11-13 ENCOUNTER — Encounter
Admit: 2019-11-13 | Discharge: 2019-11-13 | Disposition: A | Discharge status: Home / Self Care | Payer: BLUE CROSS/BLUE SHIELD | Attending: Emergency Medicine

## 2019-11-13 DIAGNOSIS — R079 Chest pain, unspecified: Principal | ICD-10-CM

## 2019-11-13 DIAGNOSIS — K219 Gastro-esophageal reflux disease without esophagitis: Principal | ICD-10-CM

## 2019-11-13 DIAGNOSIS — Z7951 Long term (current) use of inhaled steroids: Secondary | ICD-10-CM | POA: Diagnosis not present

## 2019-11-13 DIAGNOSIS — R202 Paresthesia of skin: Secondary | ICD-10-CM | POA: Diagnosis not present

## 2019-11-13 DIAGNOSIS — Z7982 Long term (current) use of aspirin: Secondary | ICD-10-CM | POA: Diagnosis not present

## 2019-11-13 DIAGNOSIS — I2510 Atherosclerotic heart disease of native coronary artery without angina pectoris: Secondary | ICD-10-CM | POA: Diagnosis not present

## 2019-11-13 DIAGNOSIS — I4891 Unspecified atrial fibrillation: Secondary | ICD-10-CM | POA: Diagnosis not present

## 2019-11-13 DIAGNOSIS — I10 Essential (primary) hypertension: Secondary | ICD-10-CM | POA: Diagnosis not present

## 2019-11-13 DIAGNOSIS — F419 Anxiety disorder, unspecified: Secondary | ICD-10-CM | POA: Diagnosis not present

## 2019-11-13 DIAGNOSIS — E785 Hyperlipidemia, unspecified: Secondary | ICD-10-CM | POA: Diagnosis not present

## 2019-11-13 DIAGNOSIS — J45909 Unspecified asthma, uncomplicated: Secondary | ICD-10-CM | POA: Diagnosis not present

## 2019-11-13 DIAGNOSIS — I252 Old myocardial infarction: Secondary | ICD-10-CM | POA: Diagnosis not present

## 2019-11-13 DIAGNOSIS — Z79899 Other long term (current) drug therapy: Secondary | ICD-10-CM | POA: Diagnosis not present

## 2019-11-13 DIAGNOSIS — Z87891 Personal history of nicotine dependence: Secondary | ICD-10-CM | POA: Diagnosis not present

## 2019-11-13 LAB — CBC W/ AUTO DIFF
BASOPHILS ABSOLUTE COUNT: 0.1 10*9/L (ref 0.0–0.1)
BASOPHILS RELATIVE PERCENT: 0.8 %
EOSINOPHILS ABSOLUTE COUNT: 0.1 10*9/L (ref 0.0–0.7)
EOSINOPHILS RELATIVE PERCENT: 1.8 %
HEMATOCRIT: 36.8 % — ABNORMAL LOW (ref 38.0–50.0)
LYMPHOCYTES ABSOLUTE COUNT: 1.4 10*9/L (ref 0.7–4.0)
LYMPHOCYTES RELATIVE PERCENT: 20.2 %
MEAN CORPUSCULAR HEMOGLOBIN CONC: 31.4 g/dL (ref 30.0–36.0)
MEAN CORPUSCULAR HEMOGLOBIN: 21.1 pg — ABNORMAL LOW (ref 26.0–34.0)
MONOCYTES ABSOLUTE COUNT: 0.8 10*9/L (ref 0.1–1.0)
MONOCYTES RELATIVE PERCENT: 11.8 %
NEUTROPHILS ABSOLUTE COUNT: 4.4 10*9/L (ref 1.7–7.7)
NEUTROPHILS RELATIVE PERCENT: 65.4 %
NUCLEATED RED BLOOD CELLS: 0 /100{WBCs} (ref <=4)
PLATELET COUNT: 434 10*9/L (ref 150–450)
RED BLOOD CELL COUNT: 5.46 10*12/L (ref 4.32–5.72)
RED CELL DISTRIBUTION WIDTH: 19.1 % — ABNORMAL HIGH (ref 12.0–15.0)
WBC ADJUSTED: 6.8 10*9/L (ref 3.5–10.5)

## 2019-11-13 LAB — BASIC METABOLIC PANEL
ANION GAP: 5 mmol/L (ref 5–14)
BUN / CREAT RATIO: 17
CALCIUM: 9.3 mg/dL (ref 8.7–10.4)
CHLORIDE: 103 mmol/L (ref 98–107)
CO2: 26.4 mmol/L (ref 20.0–31.0)
CREATININE: 1.06 mg/dL
EGFR CKD-EPI AA MALE: >90 mL/min/{1.73_m2} (ref >=60)
EGFR CKD-EPI NON-AA MALE: 86 mL/min/{1.73_m2} (ref >=60)
GLUCOSE RANDOM: 101 mg/dL (ref 70–179)
SODIUM: 134 mmol/L — ABNORMAL LOW (ref 135–145)

## 2019-11-13 LAB — HIGH SENSITIVITY TROPONIN I: Lab: <3

## 2019-11-13 LAB — BUN / CREAT RATIO: Urea nitrogen/Creatinine:MRto:Pt:Ser/Plas:Qn:: 17

## 2019-11-13 LAB — MONOCYTES ABSOLUTE COUNT: Monocytes:NCnc:Pt:Bld:Qn:Automated count: 0.8

## 2019-11-13 MED ADMIN — aspirin chewable tablet 324 mg: 324 mg | ORAL | @ 13:00:00 | Stop: 2019-11-13

## 2019-11-13 MED ADMIN — sucralfate (CARAFATE) oral suspension: 1 g | ORAL | @ 13:00:00 | Stop: 2019-11-13

## 2019-11-13 MED ADMIN — ondansetron (ZOFRAN-ODT) disintegrating tablet 4 mg: 4 mg | ORAL | @ 13:00:00 | Stop: 2019-11-13

## 2019-11-13 MED ADMIN — aluminum-magnesium hydroxide-simethicone (MAALOX MAX) 80-80-8 mg/mL oral suspension: 30 mL | ORAL | @ 13:00:00 | Stop: 2019-11-13

## 2019-11-13 NOTE — Unmapped (Signed)
Pt reports left arm tingling x 4 days and chest pain since 2 am. Pt with no other symptoms or concerns
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
## 2019-11-17 ENCOUNTER — Encounter: Payer: Self-pay | Admitting: Family Medicine

## 2019-11-17 ENCOUNTER — Other Ambulatory Visit: Payer: Self-pay

## 2019-11-17 ENCOUNTER — Ambulatory Visit (INDEPENDENT_AMBULATORY_CARE_PROVIDER_SITE_OTHER): Payer: BC Managed Care – PPO | Admitting: Family Medicine

## 2019-11-17 VITALS — BP 124/78 | HR 81 | Temp 98.0°F | Resp 18 | Ht 72.0 in | Wt 241.1 lb

## 2019-11-17 DIAGNOSIS — E782 Mixed hyperlipidemia: Secondary | ICD-10-CM

## 2019-11-17 DIAGNOSIS — Z79899 Other long term (current) drug therapy: Secondary | ICD-10-CM

## 2019-11-17 DIAGNOSIS — Z789 Other specified health status: Secondary | ICD-10-CM

## 2019-11-17 DIAGNOSIS — G8929 Other chronic pain: Secondary | ICD-10-CM

## 2019-11-17 DIAGNOSIS — M791 Myalgia, unspecified site: Secondary | ICD-10-CM

## 2019-11-17 DIAGNOSIS — F419 Anxiety disorder, unspecified: Secondary | ICD-10-CM

## 2019-11-17 DIAGNOSIS — Z79891 Long term (current) use of opiate analgesic: Secondary | ICD-10-CM

## 2019-11-17 DIAGNOSIS — Z955 Presence of coronary angioplasty implant and graft: Secondary | ICD-10-CM

## 2019-11-17 DIAGNOSIS — R52 Pain, unspecified: Secondary | ICD-10-CM

## 2019-11-17 MED ORDER — BENZONATATE 100 MG PO CAPS
100.0000 mg | ORAL_CAPSULE | Freq: Three times a day (TID) | ORAL | 0 refills | Status: DC | PRN
Start: 2019-11-17 — End: 2020-05-16

## 2019-11-17 MED ORDER — HYDROCODONE-ACETAMINOPHEN 5-325 MG PO TABS: 1.0000 | ORAL_TABLET | Freq: Two times a day (BID) | ORAL | 0 refills | Status: DC | PRN
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
## 2019-11-17 NOTE — Patient Instructions (Addendum)
[facility] in [place] - multispecialty pain clinic -  See if this is something you would be interested in trying - I believe it would help with your anxiety about your health and help with limiting pain and symptoms  If you would like to follow up with your past psychologist (?) then that would be a great options too.  I would continue zoloft daily for anxiety  Continue cymbalta 30 mg daily to help with chronic pain Continue pregabalin 25 mg daily at bedtime to also help with chronic pain  Continue to use tylenol over the counter for pain, continue working on staying physically active - this helps your bowels and you back and nerve pain, and continue to use the minimal amount of hydrocodone possible to avoid the GI side effects.  Please let me know what cardiology says about checking your cholesterol.

## 2019-11-24 NOTE — Unmapped (Signed)
[facility] Refill Coordination Note    Specialty Medication(s) to be Shipped:   General Specialty: Repatha    Other medication(s) to be shipped: No additional medications requested for fill at this time     Ryan Hale, DOB: February 02, 1976  Phone: [phone] (home)       All above HIPAA information was verified with patient.     Was a Nurse, learning disability used for this call? No    Completed refill call assessment today to schedule patient's medication shipment from the [facility] [phone]).       Specialty medication(s) and dose(s) confirmed: Regimen is correct and unchanged.   Changes to medications: [name] reports no changes at this time.  Changes to insurance: No  Questions for the pharmacist: No    Confirmed patient received Welcome Packet with first shipment. The patient will receive a drug information handout for each medication shipped and additional FDA Medication Guides as required.       DISEASE/MEDICATION-SPECIFIC INFORMATION        For patients on injectable medications: Patient currently has 0 doses left.  Next injection is scheduled for 11/30/19.    SPECIALTY MEDICATION ADHERENCE     Medication Adherence    Patient reported X missed doses in the last month: 0  Specialty Medication: Repatha 140mg /ml  Patient is on additional specialty medications: No                Repatha 140 mg/ml: 0 days of medicine on hand         SHIPPING     Shipping address confirmed in Epic.     Delivery Scheduled: Yes, Expected medication delivery date: 11/27/19.     Medication will be delivered via Same Day Courier to the prescription address in Epic WAM.    Nancy Nordmann [facility] Specialty Technician

## 2019-11-27 ENCOUNTER — Other Ambulatory Visit: Payer: Self-pay

## 2019-11-27 MED ORDER — FLUTICASONE PROPIONATE 50 MCG/ACT NA SUSP: 2.0000 | Freq: Every day | NASAL | 2 refills | Status: DC

## 2019-11-27 MED FILL — REPATHA SYRINGE 140 MG/ML SUBCUTANEOUS SYRINGE: SUBCUTANEOUS | 28 days supply | Qty: 2 | Fill #3

## 2019-11-27 MED FILL — REPATHA SYRINGE 140 MG/ML SUBCUTANEOUS SYRINGE: 28 days supply | Qty: 2 | Fill #3 | Status: AC

## 2019-11-28 ENCOUNTER — Encounter
Admit: 2019-11-28 | Discharge: 2019-11-28 | Disposition: A | Discharge status: Home / Self Care | Payer: BLUE CROSS/BLUE SHIELD | Attending: Emergency Medicine

## 2019-11-28 DIAGNOSIS — L0291 Cutaneous abscess, unspecified: Principal | ICD-10-CM

## 2019-11-28 DIAGNOSIS — E785 Hyperlipidemia, unspecified: Secondary | ICD-10-CM | POA: Diagnosis not present

## 2019-11-28 DIAGNOSIS — Y249XXS Unspecified firearm discharge, undetermined intent, sequela: Secondary | ICD-10-CM | POA: Diagnosis not present

## 2019-11-28 DIAGNOSIS — Z7982 Long term (current) use of aspirin: Secondary | ICD-10-CM | POA: Diagnosis not present

## 2019-11-28 DIAGNOSIS — Z888 Allergy status to other drugs, medicaments and biological substances status: Secondary | ICD-10-CM | POA: Diagnosis not present

## 2019-11-28 DIAGNOSIS — I2510 Atherosclerotic heart disease of native coronary artery without angina pectoris: Secondary | ICD-10-CM | POA: Diagnosis not present

## 2019-11-28 DIAGNOSIS — F419 Anxiety disorder, unspecified: Secondary | ICD-10-CM | POA: Diagnosis not present

## 2019-11-28 DIAGNOSIS — Z79899 Other long term (current) drug therapy: Secondary | ICD-10-CM | POA: Diagnosis not present

## 2019-11-28 DIAGNOSIS — Z87891 Personal history of nicotine dependence: Secondary | ICD-10-CM | POA: Diagnosis not present

## 2019-11-28 DIAGNOSIS — I4891 Unspecified atrial fibrillation: Secondary | ICD-10-CM | POA: Diagnosis not present

## 2019-11-28 DIAGNOSIS — J45909 Unspecified asthma, uncomplicated: Secondary | ICD-10-CM | POA: Diagnosis not present

## 2019-11-28 DIAGNOSIS — L905 Scar conditions and fibrosis of skin: Secondary | ICD-10-CM | POA: Diagnosis not present

## 2019-11-28 DIAGNOSIS — Z6833 Body mass index (BMI) 33.0-33.9, adult: Secondary | ICD-10-CM | POA: Diagnosis not present

## 2019-11-28 DIAGNOSIS — I252 Old myocardial infarction: Secondary | ICD-10-CM | POA: Diagnosis not present

## 2019-11-28 DIAGNOSIS — I10 Essential (primary) hypertension: Secondary | ICD-10-CM | POA: Diagnosis not present

## 2019-11-28 DIAGNOSIS — Z7951 Long term (current) use of inhaled steroids: Secondary | ICD-10-CM | POA: Diagnosis not present

## 2019-11-28 DIAGNOSIS — L02211 Cutaneous abscess of abdominal wall: Secondary | ICD-10-CM | POA: Diagnosis not present

## 2019-11-28 MED ADMIN — lidocaine-EPINEPHrine (XYLOCAINE W/EPI) 1 %-1:100,000 injection 10 mL: 10 mL | @ 09:00:00 | Stop: 2019-11-28

## 2019-11-28 NOTE — Unmapped (Signed)
Pt with abscess below umbilicus. Pt first noticed two days ago. No fevers or drainage from area.

## 2019-12-05 NOTE — Unmapped (Signed)
DIVISION OF CARDIOLOGY   [employer], [place]        Date of Service: 12/07/2019     Assessment/Plan     1. CAD  Patient is s/p PCI to LAD & RPL (12/2017)??in setting of unstable angina.??He has known??CTO of LCx that would be a high risk intervention. Long hx of atypical CP w/ multiple ED visits. Normal nuclear stress 08/2018. Doing well, no recent CP.   - metoprolol stopped 2/2 sexual side effects   - continue ASA  - lipid mgmt per below  - encouraged regular exercise    2. Hyperlipidemia, unspecified hyperlipidemia type  Hx of statin intolerance/myalgias. Now on zetia & Repatha.  - recheck lipids    Lab Results   Component Value Date    LDL 79.9 07/31/2019       Return in about 6 months (around 06/05/2020).     I personally spent 25 minutes face-to-face and non-face-to-face in the care of this patient, which includes all pre, intra, and post visit time on the date of service.    Subjective:   [id number]  Chief complaint:  44 y.o. male with a history of CAD, statin intolerance and recurring atypical chest pain with multiple ED visits who presents for follow up.     History of Present Illness:  Patient is s/p PCI to LAD & RPL (12/2017)??in setting of unstable angina.??He has known??CTO of LCx that would be a high risk intervention. Long hx of atypical CP w/ multiple ED visits. Normal nuclear stress 08/2018. Patient was last seen by me in January 2021 and appeared to be doing well overall. His low dose metoprolol was stopped due to sexual side effects. He started back on Repatha and most recent labs showed an improved LDL of 79.9. He wore a ziopatch this spring for palpitations - symptoms largely correlated w/ PACs.     Patient presents today reporting doing overall well. Endorses some SOB that he believes is due to his Hydrocodone. He is currently being weaned down off of it. He stated he stopped taking it for a 4 day period and his SOB and indigestion improved, but states he started having some joint pain in his knees. However, overall he stated that this was the best he has felt in the past 3-4 years. He has not tried Voltaren gel on his knees. He endorses occasional skipped beats that he believes is also made worse with hydrocodone.    He states he is not exercising frequently, and not eating as healthy as he would like. Gained ~7 pounds over the past couple of months. He has been compliant with Repatha and Zetia.    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 He also owns and operates a tow truck. Has received the COVID-19 vaccine. Patient has history of an active COVID-19 infection in November 2020. He had questions about the COVID-19 booster.    Patient Active Problem List   Diagnosis   ??? Chest pain   ??? HTN (hypertension)   ??? Anxiety   ??? HLD (hyperlipidemia)   ??? GERD (gastroesophageal reflux disease)   ??? Coronary artery disease involving native coronary artery of native heart without angina pectoris   ??? Adjustment disorder with mixed anxiety and depressed mood   ??? OSA (obstructive sleep apnea)   ??? Near syncope   ??? Statin intolerance   ??? COVID-19 virus infection       Medications:  Current Outpatient Medications   Medication Sig Dispense Refill   ??? acetaminophen (TYLENOL) 500 MG tablet Take 500 mg by mouth every four (4) hours as needed.      ??? albuterol HFA 90 mcg/actuation inhaler Inhale 2 puffs every six (6) hours as needed.      ??? aspirin 81 MG chewable tablet Chew 1 tablet (81 mg total) daily. 30 tablet 11   ??? budesonide-formoteroL (SYMBICORT) 160-4.5 mcg/actuation inhaler Inhale 2 puffs.     ??? chlorthalidone (HYGROTON) 25 MG tablet TAKE 1 TABLET(25 MG) BY MOUTH DAILY 30 tablet 6   ??? DEXILANT 60 mg capsule daily.      ??? diclofenac sodium (VOLTAREN) 1 % gel Apply 2 g topically two (2) times a day as needed.      ??? evolocumab (REPATHA SYRINGE) 140 mg/mL Syrg Inject the contents of 1 syringe (140 mg) under the skin every fourteen (14) days. 2 mL 5 ??? ezetimibe (ZETIA) 10 mg tablet Take 1 tablet (10 mg total) by mouth daily. 90 tablet 3   ??? fluticasone propionate (FLONASE) 50 mcg/actuation nasal spray 2 sprays into each nostril daily. 16 g 0   ??? HYDROcodone-acetaminophen (NORCO) 5-325 mg per tablet Take 0.5-1 tablets by mouth every six (6) hours as needed.      ??? lidocaine (LIDODERM) 5 % patch Place 1 patch on the skin.     ??? loratadine (CLARITIN) 10 mg tablet Take 10 mg by mouth daily as needed.      ??? nitroglycerin (NITROSTAT) 0.4 MG SL tablet Place 1 tablet (0.4 mg total) under the tongue every five (5) minutes as needed for chest pain. 30 tablet 2   ??? pregabalin (LYRICA) 25 MG capsule Take 25 mg poqam and 50 mg poqpm for chronic pain     ??? sertraline (ZOLOFT) 25 MG tablet Take 25 mg by mouth daily.     ??? TiZANidine (ZANAFLEX) 4 MG capsule Take 4 mg by mouth Three (3) times a day as needed.  (Patient not taking: Reported on 12/07/2019)       No current facility-administered medications for this visit.       Allergies  Allergies   Allergen Reactions   ??? Ace Inhibitors Swelling   ??? Lisinopril Swelling   ??? Norvasc [Amlodipine] Palpitations       Social History:   Social History     Tobacco Use   ??? Smoking status: Former Smoker     Packs/day: 0.75     Years: 9.00     Pack years: 6.75     Types: Cigarettes   ??? Smokeless tobacco: Never Used   Vaping Use   ??? Vaping Use: Never used   Substance Use Topics   ??? Alcohol use: No   ??? Drug use: No       Family History:  Family History   Problem Relation Age of Onset   ??? Heart attack Father    ??? Liver disease Mother        ROS- 12 system review is negative other than what is specified in the History of Present Illness.      Objective:   Physical Exam  Vitals:    12/07/19 0829   BP: 127/72   BP Site: R Arm   BP Position: Sitting   BP Cuff Size: X-Large   Pulse: 82   Temp: 36.6 ??C (97.9 ??F)   SpO2: 98%   Weight: (!) 110.1 kg (242 lb 12.8 oz)   Height: 180.3  cm (5' 10.98)     General-  Patient is well-appearing in no acute distress  Neurologic- Alert and oriented X3.  Cranial nerve II-XII grossly intact.  HEENT-  Normocephalic atraumatic head.  No scleral icterus.  Wearing face mask  Neck- Supple, no carotid bruis, no JVD  Lungs- Clear to auscultation, no wheezes, rhonchi, or rhales.  Heart-  RRR, no MRG.  Abdomen- Soft, nontender, no organomegally.  Extremities-  No clubbing or cyanosis.  No pitting edema to LE bilaterally  Pulses- 2+ pulses in radial and dorsalis pedis bilaterally.  Psych- Normal mood, appropriate.      Laboratory data:      Component Value Date/Time    PROBNP 28.5 11/21/2018 2029       Lab Results   Component Value Date    WBC 6.8 11/13/2019    HGB 11.5 (L) 11/13/2019    HCT 36.8 (L) 11/13/2019    PLT 434 11/13/2019       Lab Results   Component Value Date    NA 134 (L) 11/13/2019    K 3.7 11/13/2019    CL 103 11/13/2019    CO2 26.4 11/13/2019    BUN 18 11/13/2019    CREATININE 1.06 11/13/2019    GLU 101 11/13/2019    CALCIUM 9.3 11/13/2019    MG 2.0 08/28/2019       Lab Results   Component Value Date    TSH 2.455 11/30/2018    ALBUMIN 3.8 10/07/2019    ALT 34 10/07/2019    AST 26 10/07/2019    INR 1.20 07/30/2019       Electrocardiogram:  From August 2021 showed NSR.    Echocardiogram:  From September 2019 at [facility] showed mild LVH, normal systolic function, EF 55-60%. Mild MR.    Repeat from December 2019 showed normal LV function with EF>55%. Showed mild LVH.     Nuclear stress test:  From February 2019 at [facility] was a normal myocardial perfusion scan with no evidence of stress-induced myocardial ischemia. EF 59%.    From May 2020 was a normal myocardial perfusion study. Showed no evidence of significant ischemia or scar. Post stress showed EF >65%.     Cardiac catheterization:  From September 2019 showed:  1. Mild lateral wall hypokinesis with preserved LV systolic function  2. Coronary artery disease including 50% mid-RCA stenosis, 99% rPL stenosis, occluded proximal circumflex, 70% mid-PDA stenosis and 90% distal LAD stenosis  3. Successful PCI of??distal??LAD??with placement of an Onyx drug eluting stent    From September 2019 showed patient stent in mid/distal LAD, LCx is occluded proximally just distal to the OM1 takeoff, OM1 has mild diffuse disease up to 30% in the mid portion, diffuse disease of the RCA with 50% mid stenosis, 99% rPL stenosis, and 60% mid-PDA stenosis. Successful PCI to RPL.    Zio patch:  From June 2021 showed patient had a min HR of 28 bpm, max HR of 168 bpm, and avg HR of 82 bpm. Predominant underlying rhythm was SR. 1 episode(s) of AV Block (2nd?? Mobitz II) occurred, lasting a total of 2 secs. Isolated SVEs were rare (<1.0%, 1492), SVE Couplets were rare (<1.0%, 4), and no SVE Triplets were present. Isolated VEs were rare (<1.0%, 5), and no VE Couplets or VE Triplets were present. There were 22 patient triggered events/diary entries. The majority of them corresponded to sinus rhythm with PAC. ??A few episodes corresponded to SR.    Lipid panel:  Component  Latest Ref Rng & Units 07/31/2019   LDL Direct      60.0 - 99.0 mg/dL 16.1       This note was entered by Lourena Simmonds acting as scribe for [employer], AGNP-C.  Signature: JRS  Date: 12/07/2019  Time: 8:53 AM     I have reviewed the documentation provided by the scribe and confirm that it accurately reflects the service I personally performed and the decisions made by me.  Signature: CJR  Date: 12/07/2019  Time: 8:53 AM
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
## 2019-12-07 ENCOUNTER — Encounter
Admit: 2019-12-07 | Discharge: 2019-12-08 | Payer: BLUE CROSS/BLUE SHIELD | Attending: Adult Health | Primary: Adult Health

## 2019-12-07 DIAGNOSIS — E785 Hyperlipidemia, unspecified: Principal | ICD-10-CM

## 2019-12-07 DIAGNOSIS — I2510 Atherosclerotic heart disease of native coronary artery without angina pectoris: Principal | ICD-10-CM

## 2019-12-07 DIAGNOSIS — Z6833 Body mass index (BMI) 33.0-33.9, adult: Secondary | ICD-10-CM | POA: Diagnosis not present

## 2019-12-07 LAB — LIPID PANEL
CHOLESTEROL/HDL RATIO SCREEN: 3.6 (ref 1.0–4.5)
CHOLESTEROL: 133 mg/dL (ref <=200)
HDL CHOLESTEROL: 37 mg/dL — ABNORMAL LOW (ref 40–60)
LDL CHOLESTEROL CALCULATED: 68 mg/dL (ref 40–99)
NON-HDL CHOLESTEROL: 96 mg/dL (ref 70–130)
TRIGLYCERIDES: 139 mg/dL (ref 0–150)

## 2019-12-07 LAB — LDL CHOLESTEROL DIRECT: Cholesterol.in LDL:MCnc:Pt:Ser/Plas:Qn:Direct assay: 82.9

## 2019-12-07 LAB — FASTING

## 2019-12-07 NOTE — Unmapped (Signed)
Get lipids checked    Recommend regular aerobic exercise

## 2019-12-11 ENCOUNTER — Encounter: Payer: Self-pay | Admitting: Family Medicine

## 2019-12-11 DIAGNOSIS — G8929 Other chronic pain: Secondary | ICD-10-CM

## 2019-12-11 DIAGNOSIS — Z79899 Other long term (current) drug therapy: Secondary | ICD-10-CM

## 2019-12-11 DIAGNOSIS — Z79891 Long term (current) use of opiate analgesic: Secondary | ICD-10-CM

## 2019-12-15 NOTE — Unmapped (Signed)
[facility] Refill Coordination Note    Specialty Medication(s) to be Shipped:   General Specialty: Repatha    Other medication(s) to be shipped: No additional medications requested for fill at this time     Ryan Hale, DOB: [birth year]  Phone: [phone] (home)       All above HIPAA information was verified with patient.     Was a Nurse, learning disability used for this call? No    Completed refill call assessment today to schedule patient's medication shipment from the [facility] [phone]).       Specialty medication(s) and dose(s) confirmed: Regimen is correct and unchanged.   Changes to medications: Ryan Hale reports no changes at this time.  Changes to insurance: No  Questions for the pharmacist: No    Confirmed patient received Welcome Packet with first shipment. The patient will receive a drug information handout for each medication shipped and additional FDA Medication Guides as required.       DISEASE/MEDICATION-SPECIFIC INFORMATION        For patients on injectable medications: Patient currently has 0 doses left.  Next injection is scheduled for 09/22.    SPECIALTY MEDICATION ADHERENCE     Medication Adherence    Patient reported X missed doses in the last month: 0  Specialty Medication: repatha 140mg /ml  Patient is on additional specialty medications: No  Informant: patient  Reliability of informant: reliable  Patient is at risk for Non-Adherence: No                repatha 140 mg/ml: 0 days of medicine on hand         SHIPPING     Shipping address confirmed in Epic.     Delivery Scheduled: Yes, Expected medication delivery date: 09/16.     Medication will be delivered via Same Day Courier to the prescription address in Epic WAM.    Ryan Hale   [facility] Specialty Technician

## 2019-12-19 MED ORDER — HYDROCODONE-ACETAMINOPHEN 5-325 MG PO TABS: 1.0000 | ORAL_TABLET | Freq: Two times a day (BID) | ORAL | 0 refills | Status: DC | PRN

## 2019-12-21 MED FILL — REPATHA SYRINGE 140 MG/ML SUBCUTANEOUS SYRINGE: 28 days supply | Qty: 2 | Fill #4 | Status: AC

## 2019-12-21 MED FILL — REPATHA SYRINGE 140 MG/ML SUBCUTANEOUS SYRINGE: SUBCUTANEOUS | 28 days supply | Qty: 2 | Fill #4

## 2020-01-11 NOTE — Unmapped (Signed)
[facility] Refill Coordination Note    Specialty Medication(s) to be Shipped:   General Specialty: Repatha    Other medication(s) to be shipped: No additional medications requested for fill at this time     Ryan Hale, DOB: [birth year]  Phone: [phone] (home)       All above HIPAA information was verified with patient.     Was a Nurse, learning disability used for this call? No    Completed refill call assessment today to schedule patient's medication shipment from the [facility] [phone]).       Specialty medication(s) and dose(s) confirmed: Regimen is correct and unchanged.   Changes to medications: [name] reports no changes at this time.  Changes to insurance: No  Questions for the pharmacist: No    Confirmed patient received Welcome Packet with first shipment. The patient will receive a drug information handout for each medication shipped and additional FDA Medication Guides as required.       DISEASE/MEDICATION-SPECIFIC INFORMATION        For patients on injectable medications: Patient currently has 1 doses left.  Next injection is scheduled for 01/11/2020.    SPECIALTY MEDICATION ADHERENCE     Medication Adherence    Patient reported X missed doses in the last month: 0  Specialty Medication: Repatha 140mg /ml  Patient is on additional specialty medications: No        SHIPPING     Shipping address confirmed in Epic.     Delivery Scheduled: Yes, Expected medication delivery date: 01/22/2020.     Medication will be delivered via Same Day Courier to the prescription address in Epic WAM.    Oretha Milch   [facility] Specialty Technician

## 2020-01-16 ENCOUNTER — Other Ambulatory Visit: Payer: Self-pay | Admitting: Family Medicine

## 2020-01-16 DIAGNOSIS — G8929 Other chronic pain: Secondary | ICD-10-CM

## 2020-01-16 DIAGNOSIS — Z79891 Long term (current) use of opiate analgesic: Secondary | ICD-10-CM

## 2020-01-16 DIAGNOSIS — Z79899 Other long term (current) drug therapy: Secondary | ICD-10-CM

## 2020-01-17 ENCOUNTER — Encounter: Payer: Self-pay | Admitting: Family Medicine

## 2020-01-18 MED ORDER — HYDROCODONE-ACETAMINOPHEN 5-325 MG PO TABS: 1.0000 | ORAL_TABLET | Freq: Two times a day (BID) | ORAL | 0 refills | Status: DC | PRN

## 2020-01-22 MED FILL — REPATHA SYRINGE 140 MG/ML SUBCUTANEOUS SYRINGE: 28 days supply | Qty: 2 | Fill #5 | Status: AC

## 2020-01-22 MED FILL — REPATHA SYRINGE 140 MG/ML SUBCUTANEOUS SYRINGE: SUBCUTANEOUS | 28 days supply | Qty: 2 | Fill #5

## 2020-01-29 ENCOUNTER — Encounter
Admit: 2020-01-29 | Discharge: 2020-01-29 | Disposition: A | Discharge status: Home / Self Care | Payer: BLUE CROSS/BLUE SHIELD | Attending: Family

## 2020-01-29 DIAGNOSIS — J069 Acute upper respiratory infection, unspecified: Principal | ICD-10-CM

## 2020-01-29 NOTE — Unmapped (Signed)
arriv amb to triage; coughing 3-4 days; has not seen anyone for this problem. Thinks he needs a breathing trx
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
## 2020-02-02 MED ORDER — CHLORTHALIDONE 25 MG TABLET
ORAL_TABLET | 6 refills | 0 days | Status: CP
Start: 2020-02-02 — End: ?

## 2020-02-06 ENCOUNTER — Encounter
Admit: 2020-02-06 | Discharge: 2020-02-06 | Disposition: A | Discharge status: Home / Self Care | Payer: BLUE CROSS/BLUE SHIELD | Attending: Emergency Medicine

## 2020-02-06 DIAGNOSIS — G8929 Other chronic pain: Principal | ICD-10-CM

## 2020-02-06 DIAGNOSIS — I2510 Atherosclerotic heart disease of native coronary artery without angina pectoris: Principal | ICD-10-CM

## 2020-02-06 DIAGNOSIS — E785 Hyperlipidemia, unspecified: Principal | ICD-10-CM

## 2020-02-06 DIAGNOSIS — R0789 Other chest pain: Principal | ICD-10-CM

## 2020-02-06 DIAGNOSIS — R051 Acute cough: Principal | ICD-10-CM

## 2020-02-06 DIAGNOSIS — I119 Hypertensive heart disease without heart failure: Principal | ICD-10-CM

## 2020-02-06 DIAGNOSIS — J45909 Unspecified asthma, uncomplicated: Principal | ICD-10-CM

## 2020-02-06 DIAGNOSIS — Z955 Presence of coronary angioplasty implant and graft: Principal | ICD-10-CM

## 2020-02-06 DIAGNOSIS — Z8679 Personal history of other diseases of the circulatory system: Principal | ICD-10-CM

## 2020-02-06 DIAGNOSIS — I4891 Unspecified atrial fibrillation: Principal | ICD-10-CM

## 2020-02-06 DIAGNOSIS — G629 Polyneuropathy, unspecified: Principal | ICD-10-CM

## 2020-02-06 DIAGNOSIS — Z20822 Contact with and (suspected) exposure to covid-19: Principal | ICD-10-CM

## 2020-02-06 DIAGNOSIS — R059 Cough: Principal | ICD-10-CM

## 2020-02-06 DIAGNOSIS — R079 Chest pain, unspecified: Principal | ICD-10-CM

## 2020-02-06 DIAGNOSIS — J45901 Unspecified asthma with (acute) exacerbation: Principal | ICD-10-CM

## 2020-02-06 LAB — BASIC METABOLIC PANEL
ANION GAP: 10 mmol/L (ref 5–14)
BLOOD UREA NITROGEN: 11 mg/dL (ref 9–23)
BUN / CREAT RATIO: 10
CALCIUM: 9.7 mg/dL (ref 8.7–10.4)
CHLORIDE: 101 mmol/L (ref 98–107)
CO2: 26.3 mmol/L (ref 20.0–31.0)
CREATININE: 1.11 mg/dL
EGFR CKD-EPI NON-AA MALE: 81 mL/min/{1.73_m2} (ref >=60)
GLUCOSE RANDOM: 85 mg/dL (ref 70–179)
SODIUM: 137 mmol/L (ref 135–145)

## 2020-02-06 LAB — CBC W/ AUTO DIFF
BASOPHILS ABSOLUTE COUNT: 0.1 10*9/L (ref 0.0–0.1)
BASOPHILS RELATIVE PERCENT: 1.3 %
EOSINOPHILS RELATIVE PERCENT: 1.5 %
HEMATOCRIT: 42 % (ref 38.0–50.0)
LYMPHOCYTES ABSOLUTE COUNT: 2 10*9/L (ref 0.7–4.0)
LYMPHOCYTES RELATIVE PERCENT: 24.7 %
MEAN CORPUSCULAR HEMOGLOBIN CONC: 31.4 g/dL (ref 30.0–36.0)
MEAN CORPUSCULAR HEMOGLOBIN: 21.2 pg — ABNORMAL LOW (ref 26.0–34.0)
MEAN CORPUSCULAR VOLUME: 67.6 fL — ABNORMAL LOW (ref 81.0–95.0)
MONOCYTES ABSOLUTE COUNT: 0.8 10*9/L (ref 0.1–1.0)
MONOCYTES RELATIVE PERCENT: 9.9 %
NEUTROPHILS ABSOLUTE COUNT: 5 10*9/L (ref 1.7–7.7)
NEUTROPHILS RELATIVE PERCENT: 62.6 %
NUCLEATED RED BLOOD CELLS: 0 /100{WBCs} (ref <=4)
PLATELET COUNT: 477 10*9/L — ABNORMAL HIGH (ref 150–450)
RED BLOOD CELL COUNT: 6.22 10*12/L — ABNORMAL HIGH (ref 4.32–5.72)
RED CELL DISTRIBUTION WIDTH: 18.8 % — ABNORMAL HIGH (ref 12.0–15.0)
WBC ADJUSTED: 7.9 10*9/L (ref 3.5–10.5)

## 2020-02-06 LAB — HIGH SENSITIVITY TROPONIN I: Lab: <3

## 2020-02-06 LAB — CHLORIDE: Chloride:SCnc:Pt:Ser/Plas:Qn:: 101

## 2020-02-06 LAB — EOSINOPHILS RELATIVE PERCENT: Eosinophils/100 leukocytes:NFr:Pt:Bld:Qn:Automated count: 1.5

## 2020-02-06 MED ORDER — PREDNISONE 20 MG TABLET
ORAL_TABLET | Freq: Every day | ORAL | 0 refills | 3 days | Status: CP
Start: 2020-02-06 — End: 2020-02-09

## 2020-02-06 MED ORDER — BENZONATATE 100 MG CAPSULE
ORAL_CAPSULE | Freq: Three times a day (TID) | ORAL | 0 refills | 7 days | Status: CP
Start: 2020-02-06 — End: 2020-02-13

## 2020-02-06 MED ADMIN — acetaminophen (TYLENOL) tablet 1,000 mg: 1000 mg | ORAL | @ 21:00:00 | Stop: 2020-02-06

## 2020-02-06 MED ADMIN — lidocaine (LIDODERM) 5 % patch 1 patch: 1 | TRANSDERMAL | @ 21:00:00 | Stop: 2020-02-06

## 2020-02-06 MED ADMIN — predniSONE (DELTASONE) tablet 40 mg: 40 mg | ORAL | @ 22:00:00 | Stop: 2020-02-06
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
## 2020-02-06 NOTE — Unmapped (Signed)
Pt presents with chest pain since this morning. Pt reports has a cold. Pt with no other symptoms or concerns

## 2020-02-06 NOTE — Unmapped (Signed)
.  Patient rounding complete, call bell in reach, bed locked and in lowest position, patient belongings at bedside and within reach of patient.  Patient updated on plan of care.      Pt paced on cardiac monitor, continuous pulse oximeter and continuous blood pressure monitoring.     ED MD at the bedside with pt.

## 2020-02-09 DIAGNOSIS — E785 Hyperlipidemia, unspecified: Principal | ICD-10-CM

## 2020-02-09 DIAGNOSIS — I2510 Atherosclerotic heart disease of native coronary artery without angina pectoris: Principal | ICD-10-CM

## 2020-02-09 MED ORDER — REPATHA SYRINGE 140 MG/ML SUBCUTANEOUS SYRINGE
SUBCUTANEOUS | 5 refills | 28 days | Status: CP
Start: 2020-02-09 — End: ?
  Filled 2020-02-19: qty 2, 28d supply, fill #0

## 2020-02-09 NOTE — Unmapped (Signed)
[facility] Refill Coordination Note    Specialty Medication(s) to be Shipped:   General Specialty: Repatha    Other medication(s) to be shipped: No additional medications requested for fill at this time     Ryan Hale, DOB: 03-12-1976  Phone: [phone] (home)       All above HIPAA information was verified with patient.     Was a Nurse, learning disability used for this call? No    Completed refill call assessment today to schedule patient's medication shipment from the [facility] [phone]).       Specialty medication(s) and dose(s) confirmed: Regimen is correct and unchanged.   Changes to medications: Ryan Hale reports no changes at this time.  Changes to insurance: No  Questions for the pharmacist: No    Confirmed patient received Welcome Packet with first shipment. The patient will receive a drug information handout for each medication shipped and additional FDA Medication Guides as required.       DISEASE/MEDICATION-SPECIFIC INFORMATION        For patients on injectable medications: Patient currently has 0 doses left.  Next injection is scheduled for 02/22/20.    SPECIALTY MEDICATION ADHERENCE     Medication Adherence    Patient reported X missed doses in the last month: 0  Specialty Medication: Repatha 140mg /ml  Patient is on additional specialty medications: No  Patient is on more than two specialty medications: No                Repatha 140 mg/ml: 0 days of medicine on hand         SHIPPING     Shipping address confirmed in Epic.     Delivery Scheduled: Yes, Expected medication delivery date: 02/19/20.  However, Rx request for refills was sent to the provider as there are none remaining.     Medication will be delivered via Same Day Courier to the prescription address in Epic WAM.    Nancy Nordmann [facility] Specialty Technician

## 2020-02-16 ENCOUNTER — Telehealth: Payer: Self-pay | Admitting: Family Medicine

## 2020-02-16 NOTE — Telephone Encounter (Signed)
Pt is needing a refill on hydrocodiene for pain. Pharm is [facility] on [address]

## 2020-02-16 NOTE — Telephone Encounter (Signed)
Please make him a 3 month appointment

## 2020-02-16 NOTE — Telephone Encounter (Signed)
Please send script

## 2020-02-19 MED FILL — REPATHA SYRINGE 140 MG/ML SUBCUTANEOUS SYRINGE: 28 days supply | Qty: 2 | Fill #0 | Status: AC

## 2020-02-20 ENCOUNTER — Encounter: Payer: Self-pay | Admitting: Family Medicine

## 2020-02-20 ENCOUNTER — Ambulatory Visit: Payer: Self-pay | Admitting: Family Medicine

## 2020-02-20 ENCOUNTER — Other Ambulatory Visit: Payer: Self-pay

## 2020-02-20 DIAGNOSIS — Z79891 Long term (current) use of opiate analgesic: Secondary | ICD-10-CM

## 2020-02-20 DIAGNOSIS — M79605 Pain in left leg: Secondary | ICD-10-CM

## 2020-02-20 DIAGNOSIS — Z79899 Other long term (current) drug therapy: Secondary | ICD-10-CM

## 2020-02-20 DIAGNOSIS — G8929 Other chronic pain: Secondary | ICD-10-CM

## 2020-02-20 DIAGNOSIS — R52 Pain, unspecified: Secondary | ICD-10-CM

## 2020-02-20 DIAGNOSIS — M79604 Pain in right leg: Secondary | ICD-10-CM

## 2020-02-20 MED ORDER — BACLOFEN 10 MG PO TABS: 5.0000 mg | ORAL_TABLET | Freq: Three times a day (TID) | ORAL | 5 refills | Status: DC | PRN

## 2020-02-20 MED ORDER — HYDROCODONE-ACETAMINOPHEN 5-325 MG PO TABS: 1.0000 | ORAL_TABLET | Freq: Two times a day (BID) | ORAL | 0 refills | Status: DC | PRN

## 2020-02-20 MED ORDER — PREGABALIN 25 MG PO CAPS: 25.0000 mg | ORAL_CAPSULE | Freq: Two times a day (BID) | ORAL | 1 refills | Status: DC

## 2020-02-20 MED ORDER — HYDROCODONE-ACETAMINOPHEN 5-325 MG PO TABS: ORAL_TABLET | ORAL | 0 refills | Status: DC

## 2020-02-20 MED ORDER — DULOXETINE HCL 30 MG PO CPEP: 30.0000 mg | ORAL_CAPSULE | Freq: Every day | ORAL | 1 refills | Status: DC
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
## 2020-02-20 NOTE — Telephone Encounter (Signed)
Pt has an appt scheduled for today.  

## 2020-03-05 ENCOUNTER — Encounter
Admit: 2020-03-05 | Discharge: 2020-03-05 | Disposition: A | Discharge status: Home / Self Care | Payer: BLUE CROSS/BLUE SHIELD

## 2020-03-05 DIAGNOSIS — Z7951 Long term (current) use of inhaled steroids: Principal | ICD-10-CM

## 2020-03-05 DIAGNOSIS — R079 Chest pain, unspecified: Principal | ICD-10-CM

## 2020-03-05 DIAGNOSIS — I252 Old myocardial infarction: Principal | ICD-10-CM

## 2020-03-05 DIAGNOSIS — Z7982 Long term (current) use of aspirin: Principal | ICD-10-CM

## 2020-03-05 DIAGNOSIS — I4891 Unspecified atrial fibrillation: Principal | ICD-10-CM

## 2020-03-05 DIAGNOSIS — G8929 Other chronic pain: Principal | ICD-10-CM

## 2020-03-05 DIAGNOSIS — Z87891 Personal history of nicotine dependence: Principal | ICD-10-CM

## 2020-03-05 DIAGNOSIS — M792 Neuralgia and neuritis, unspecified: Principal | ICD-10-CM

## 2020-03-05 DIAGNOSIS — I10 Essential (primary) hypertension: Principal | ICD-10-CM

## 2020-03-05 DIAGNOSIS — E785 Hyperlipidemia, unspecified: Principal | ICD-10-CM

## 2020-03-05 DIAGNOSIS — I2510 Atherosclerotic heart disease of native coronary artery without angina pectoris: Principal | ICD-10-CM

## 2020-03-05 DIAGNOSIS — Z888 Allergy status to other drugs, medicaments and biological substances status: Principal | ICD-10-CM

## 2020-03-05 DIAGNOSIS — F419 Anxiety disorder, unspecified: Principal | ICD-10-CM

## 2020-03-05 DIAGNOSIS — Z79899 Other long term (current) drug therapy: Principal | ICD-10-CM

## 2020-03-05 DIAGNOSIS — J45909 Unspecified asthma, uncomplicated: Principal | ICD-10-CM

## 2020-03-05 DIAGNOSIS — R0789 Other chest pain: Principal | ICD-10-CM

## 2020-03-05 LAB — CBC W/ AUTO DIFF
BASOPHILS ABSOLUTE COUNT: 0.1 10*9/L (ref 0.0–0.1)
BASOPHILS RELATIVE PERCENT: 1 %
EOSINOPHILS ABSOLUTE COUNT: 0.1 10*9/L (ref 0.0–0.7)
EOSINOPHILS RELATIVE PERCENT: 1.9 %
HEMATOCRIT: 39 % (ref 38.0–50.0)
HEMOGLOBIN: 12.4 g/dL — ABNORMAL LOW (ref 13.5–17.5)
LYMPHOCYTES ABSOLUTE COUNT: 1.4 10*9/L (ref 0.7–4.0)
LYMPHOCYTES RELATIVE PERCENT: 20.8 %
MEAN CORPUSCULAR HEMOGLOBIN CONC: 31.8 g/dL (ref 30.0–36.0)
MEAN CORPUSCULAR HEMOGLOBIN: 21.6 pg — ABNORMAL LOW (ref 26.0–34.0)
MEAN CORPUSCULAR VOLUME: 67.8 fL — ABNORMAL LOW (ref 81.0–95.0)
MEAN PLATELET VOLUME: 7.3 fL (ref 7.0–10.0)
MONOCYTES ABSOLUTE COUNT: 0.8 10*9/L (ref 0.1–1.0)
MONOCYTES RELATIVE PERCENT: 11.9 %
NEUTROPHILS ABSOLUTE COUNT: 4.4 10*9/L (ref 1.7–7.7)
NEUTROPHILS RELATIVE PERCENT: 64.4 %
NUCLEATED RED BLOOD CELLS: 0 /100{WBCs} (ref <=4)
PLATELET COUNT: 415 10*9/L (ref 150–450)
RED BLOOD CELL COUNT: 5.75 10*12/L — ABNORMAL HIGH (ref 4.32–5.72)
RED CELL DISTRIBUTION WIDTH: 18.2 % — ABNORMAL HIGH (ref 12.0–15.0)
WBC ADJUSTED: 6.9 10*9/L (ref 3.5–10.5)

## 2020-03-05 LAB — PROTIME-INR
INR: 1.22
PROTIME: 14.2 s — ABNORMAL HIGH (ref 10.3–13.4)

## 2020-03-05 LAB — URINALYSIS WITH CULTURE REFLEX
BACTERIA: NONE SEEN /HPF
BILIRUBIN UA: NEGATIVE
GLUCOSE UA: NEGATIVE
KETONES UA: NEGATIVE
LEUKOCYTE ESTERASE UA: NEGATIVE
NITRITE UA: NEGATIVE
PH UA: 7 (ref 5.0–9.0)
PROTEIN UA: NEGATIVE
RBC UA: 3 /HPF — ABNORMAL HIGH (ref <3)
SPECIFIC GRAVITY UA: 1.01 (ref 1.005–1.040)
SQUAMOUS EPITHELIAL: 1 /HPF (ref 0–5)
UROBILINOGEN UA: 0.2
WBC UA: 1 /HPF (ref <2)

## 2020-03-05 LAB — COMPREHENSIVE METABOLIC PANEL
ALBUMIN: 3.5 g/dL (ref 3.4–5.0)
ALKALINE PHOSPHATASE: 149 U/L — ABNORMAL HIGH (ref 46–116)
ALT (SGPT): 39 U/L (ref 10–49)
ANION GAP: 6 mmol/L (ref 5–14)
AST (SGOT): 22 U/L (ref <=34)
BILIRUBIN TOTAL: 0.4 mg/dL (ref 0.3–1.2)
BLOOD UREA NITROGEN: 17 mg/dL (ref 9–23)
BUN / CREAT RATIO: 16
CALCIUM: 9.6 mg/dL (ref 8.7–10.4)
CHLORIDE: 100 mmol/L (ref 98–107)
CO2: 27.6 mmol/L (ref 20.0–31.0)
CREATININE: 1.04 mg/dL
EGFR CKD-EPI AA MALE: >90 mL/min/{1.73_m2} (ref >=60)
EGFR CKD-EPI NON-AA MALE: 88 mL/min/{1.73_m2} (ref >=60)
GLUCOSE RANDOM: 95 mg/dL (ref 70–179)
POTASSIUM: 3.4 mmol/L (ref 3.4–4.5)
PROTEIN TOTAL: 8.5 g/dL — ABNORMAL HIGH (ref 5.7–8.2)
SODIUM: 134 mmol/L — ABNORMAL LOW (ref 135–145)

## 2020-03-05 LAB — HIGH SENSITIVITY TROPONIN I - 2H/6H SERIAL
HIGH SENSITIVITY TROPONIN - DELTA (0-2H): 0 ng/L (ref <=7)
HIGH-SENSITIVITY TROPONIN I - 2 HOUR: <3 ng/L (ref <=53)

## 2020-03-05 LAB — HIGH SENSITIVITY TROPONIN I - SINGLE: HIGH SENSITIVITY TROPONIN I: <3 ng/L (ref <=53)

## 2020-03-05 LAB — APTT
APTT: 42.4 s — ABNORMAL HIGH (ref 25.3–37.1)
HEPARIN CORRELATION: 0.2

## 2020-03-05 MED ADMIN — aluminum-magnesium hydroxide-simethicone (MAALOX MAX) 80-80-8 mg/mL oral suspension: 30 mL | ORAL | @ 12:00:00 | Stop: 2020-03-05

## 2020-03-05 MED ADMIN — aspirin chewable tablet 324 mg: 324 mg | ORAL | @ 12:00:00 | Stop: 2020-03-05

## 2020-03-05 MED ADMIN — acetaminophen (TYLENOL) tablet 650 mg: 650 mg | ORAL | @ 13:00:00 | Stop: 2020-03-05

## 2020-03-05 MED ADMIN — lidocaine (XYLOCAINE) 2% viscous mucosal solution: 10 mL | ORAL | @ 12:00:00 | Stop: 2020-03-05

## 2020-03-05 MED ADMIN — sucralfate (CARAFATE) oral suspension: 1 g | ORAL | @ 12:00:00 | Stop: 2020-03-05

## 2020-03-05 MED ADMIN — lidocaine (LIDODERM) 5 % patch 1 patch: 1 | TRANSDERMAL | @ 13:00:00 | Stop: 2020-03-05
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
## 2020-03-05 NOTE — Unmapped (Signed)
AVS printed and reviewed with patient.  All questions answered, verbalized understanding.  Patient to schedule f/u w Dr Julio Alm at cardiology.  Discharged to lobby in ambulatory condition in NAD.

## 2020-03-05 NOTE — Unmapped (Signed)
Patient having left should and chest pain, started a few days ago, pain coming and going. Dull deep pain. No resp distress noted. 6/10. Radiates to back.

## 2020-03-05 NOTE — Unmapped (Signed)
ED Attending Transfer Note    I received sign out from prior ED attending. KEISON GLENDINNING is a 44 y.o. male with a PMH of hypertension, CAD, anxiety, 3 stents s/p MI, hyperlipidemia, A-Fib, and asthma who presents to the ED for 3 days of intermittent left sided, mostly positional chest pain which radiated to his shoulder. Nuclear chest imaging in May 2020 showed some ischemic changes.     C XR was unremarkable. Initial Troponin was < 3, but will plan for repeat troponin given cardiac history and length of chest pain. Patient has received a GI cocktail and ASA for symptom management. Plan to discuss with cardiologist Dr. Joneen Roach.     Cardiology aware. Will schedule outpatient follow up as appropriate.    I discussed with patient anticipatory guidance, recommended follow up, and return precautions. Patient was provided information for follow up. The patient expresses understanding and is comfortable with the discharge plan.    XR Chest 2 views   Final Result      No acute airspace disease.        Documentation assistance was provided by Adam Phenix and Michaelle Birks, Scribes, on March 05, 2020 at 7:31 AM for Sherlene Shams, MD.       Documentation assistance was provided by the scribe in my presence.  The documentation recorded by the scribe has been reviewed by me and accurately reflects the services I personally performed.

## 2020-03-12 NOTE — Unmapped (Signed)
[facility] Clinical Assessment & Refill Coordination Note    Ryan Hale, DOB: 1975/04/13  Phone: [phone] (home)     All above HIPAA information was verified with patient.     Was a Nurse, learning disability used for this call? No    Specialty Medication(s):   General Specialty: Repatha     Current Outpatient Medications   Medication Sig Dispense Refill   ??? acetaminophen (TYLENOL) 500 MG tablet Take 500 mg by mouth every four (4) hours as needed.      ??? albuterol HFA 90 mcg/actuation inhaler Inhale 2 puffs every six (6) hours as needed.      ??? aspirin 81 MG chewable tablet Chew 1 tablet (81 mg total) daily. 30 tablet 11   ??? budesonide-formoteroL (SYMBICORT) 160-4.5 mcg/actuation inhaler Inhale 2 puffs.     ??? chlorthalidone (HYGROTON) 25 MG tablet TAKE 1 TABLET(25 MG) BY MOUTH DAILY 30 tablet 6   ??? DEXILANT 60 mg capsule daily.      ??? diclofenac sodium (VOLTAREN) 1 % gel Apply 2 g topically two (2) times a day as needed.      ??? evolocumab (REPATHA SYRINGE) 140 mg/mL Syrg Inject the contents of 1 syringe (140 mg) under the skin every fourteen (14) days. 2 mL 5   ??? ezetimibe (ZETIA) 10 mg tablet Take 1 tablet (10 mg total) by mouth daily. 90 tablet 3   ??? fluticasone propionate (FLONASE) 50 mcg/actuation nasal spray 2 sprays into each nostril daily. 16 g 0   ??? HYDROcodone-acetaminophen (NORCO) 5-325 mg per tablet Take 0.5-1 tablets by mouth every six (6) hours as needed.      ??? lidocaine (LIDODERM) 5 % patch Place 1 patch on the skin.     ??? loratadine (CLARITIN) 10 mg tablet Take 10 mg by mouth daily as needed.      ??? nitroglycerin (NITROSTAT) 0.4 MG SL tablet Place 1 tablet (0.4 mg total) under the tongue every five (5) minutes as needed for chest pain. 30 tablet 2   ??? pregabalin (LYRICA) 25 MG capsule Take 25 mg poqam and 50 mg poqpm for chronic pain     ??? sertraline (ZOLOFT) 25 MG tablet Take 25 mg by mouth daily.     ??? TiZANidine (ZANAFLEX) 4 MG capsule Take 4 mg by mouth Three (3) times a day as needed.  (Patient not taking: Reported on 12/07/2019)       No current facility-administered medications for this visit.        Changes to medications: Sandra reports no changes at this time.    Allergies   Allergen Reactions   ??? Ace Inhibitors Swelling   ??? Lisinopril Swelling   ??? Norvasc [Amlodipine] Palpitations       Changes to allergies: No    SPECIALTY MEDICATION ADHERENCE     Repatha 140 mg/ml: 0 days of medicine on hand     Medication Adherence    Patient reported X missed doses in the last month: 0  Specialty Medication: Repatha 140 mg/mL  Informant: patient          Specialty medication(s) dose(s) confirmed: Regimen is correct and unchanged.     Are there any concerns with adherence? No    Adherence counseling provided? Not needed    CLINICAL MANAGEMENT AND INTERVENTION      Clinical Benefit Assessment:    Do you feel the medicine is effective or helping your condition? Yes    Clinical Benefit counseling provided?  Not needed    Adverse Effects Assessment:    Are you experiencing any side effects? No    Are you experiencing difficulty administering your medicine? No    Quality of Life Assessment:    How many days over the past month did your high cholesterol  keep you from your normal activities? For example, brushing your teeth or getting up in the morning. Patient declined to answer    Have you discussed this with your provider? Not needed    Therapy Appropriateness:    Is therapy appropriate? Yes, therapy is appropriate and should be continued    DISEASE/MEDICATION-SPECIFIC INFORMATION      For patients on injectable medications: Patient currently has 0 doses left.  Next injection is scheduled for 03/22/20.    PATIENT SPECIFIC NEEDS     - Does the patient have any physical, cognitive, or cultural barriers? No    - Is the patient high risk? No    - Does the patient require a Care Management Plan? No     - Does the patient require physician intervention or other additional services (i.e. nutrition, smoking cessation, social work)? No      SHIPPING     Specialty Medication(s) to be Shipped:   General Specialty: Repatha    Other medication(s) to be shipped: No additional medications requested for fill at this time     Changes to insurance: No    Delivery Scheduled: Yes, Expected medication delivery date: 03/19/20.     Medication will be delivered via Same Day Courier to the confirmed prescription address in [facility].    The patient will receive a drug information handout for each medication shipped and additional FDA Medication Guides as required.  Verified that patient has previously received a Conservation officer, historic buildings.    All of the patient's questions and concerns have been addressed.    Ryan Hale   [facility] Specialty Pharmacist

## 2020-03-14 ENCOUNTER — Inpatient Hospital Stay: Payer: Self-pay | Attending: Oncology

## 2020-03-14 DIAGNOSIS — K219 Gastro-esophageal reflux disease without esophagitis: Secondary | ICD-10-CM | POA: Insufficient documentation

## 2020-03-14 DIAGNOSIS — I4891 Unspecified atrial fibrillation: Secondary | ICD-10-CM | POA: Insufficient documentation

## 2020-03-14 DIAGNOSIS — J45909 Unspecified asthma, uncomplicated: Secondary | ICD-10-CM | POA: Insufficient documentation

## 2020-03-14 DIAGNOSIS — I509 Heart failure, unspecified: Secondary | ICD-10-CM | POA: Insufficient documentation

## 2020-03-14 DIAGNOSIS — R768 Other specified abnormal immunological findings in serum: Secondary | ICD-10-CM | POA: Insufficient documentation

## 2020-03-14 DIAGNOSIS — I2510 Atherosclerotic heart disease of native coronary artery without angina pectoris: Secondary | ICD-10-CM | POA: Insufficient documentation

## 2020-03-14 DIAGNOSIS — Z79899 Other long term (current) drug therapy: Secondary | ICD-10-CM | POA: Insufficient documentation

## 2020-03-14 DIAGNOSIS — Z7902 Long term (current) use of antithrombotics/antiplatelets: Secondary | ICD-10-CM | POA: Insufficient documentation

## 2020-03-14 DIAGNOSIS — Z87891 Personal history of nicotine dependence: Secondary | ICD-10-CM | POA: Insufficient documentation

## 2020-03-14 DIAGNOSIS — E785 Hyperlipidemia, unspecified: Secondary | ICD-10-CM | POA: Insufficient documentation

## 2020-03-14 DIAGNOSIS — I252 Old myocardial infarction: Secondary | ICD-10-CM | POA: Insufficient documentation

## 2020-03-14 DIAGNOSIS — R718 Other abnormality of red blood cells: Secondary | ICD-10-CM | POA: Insufficient documentation

## 2020-03-14 DIAGNOSIS — I110 Hypertensive heart disease with heart failure: Secondary | ICD-10-CM | POA: Insufficient documentation

## 2020-03-14 DIAGNOSIS — Z955 Presence of coronary angioplasty implant and graft: Secondary | ICD-10-CM | POA: Insufficient documentation
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
## 2020-03-18 ENCOUNTER — Other Ambulatory Visit: Payer: Self-pay

## 2020-03-18 ENCOUNTER — Ambulatory Visit: Payer: BC Managed Care – PPO | Admitting: Family Medicine

## 2020-03-18 ENCOUNTER — Inpatient Hospital Stay: Payer: Self-pay

## 2020-03-18 ENCOUNTER — Telehealth: Payer: Self-pay | Admitting: Family Medicine

## 2020-03-18 DIAGNOSIS — R718 Other abnormality of red blood cells: Secondary | ICD-10-CM

## 2020-03-18 DIAGNOSIS — R778 Other specified abnormalities of plasma proteins: Secondary | ICD-10-CM

## 2020-03-18 LAB — CBC WITH DIFFERENTIAL/PLATELET
Abs Immature Granulocytes: 0.04 10*3/uL (ref 0.00–0.07)
Basophils Absolute: 0.1 10*3/uL (ref 0.0–0.1)
Basophils Relative: 1 %
Eosinophils Absolute: 0.1 10*3/uL (ref 0.0–0.5)
Eosinophils Relative: 1 %
HCT: 42.7 % (ref 39.0–52.0)
Hemoglobin: 12.9 g/dL — ABNORMAL LOW (ref 13.0–17.0)
Immature Granulocytes: 1 %
Lymphocytes Relative: 26 %
Lymphs Abs: 1.8 10*3/uL (ref 0.7–4.0)
MCH: 21.4 pg — ABNORMAL LOW (ref 26.0–34.0)
MCHC: 30.2 g/dL (ref 30.0–36.0)
MCV: 70.7 fL — ABNORMAL LOW (ref 80.0–100.0)
Monocytes Absolute: 0.8 10*3/uL (ref 0.1–1.0)
Monocytes Relative: 11 %
Neutro Abs: 4.3 10*3/uL (ref 1.7–7.7)
Neutrophils Relative %: 60 %
Platelets: 479 10*3/uL — ABNORMAL HIGH (ref 150–400)
RBC: 6.04 MIL/uL — ABNORMAL HIGH (ref 4.22–5.81)
RDW: 19 % — ABNORMAL HIGH (ref 11.5–15.5)
WBC: 7.1 10*3/uL (ref 4.0–10.5)
nRBC: 0 % (ref 0.0–0.2)

## 2020-03-18 LAB — IRON AND TIBC
Iron: 27 ug/dL — ABNORMAL LOW (ref 45–182)
Saturation Ratios: 7 % — ABNORMAL LOW (ref 17.9–39.5)
TIBC: 414 ug/dL (ref 250–450)
UIBC: 387 ug/dL

## 2020-03-18 LAB — FERRITIN: Ferritin: 19 ng/mL — ABNORMAL LOW (ref 24–336)

## 2020-03-18 NOTE — Telephone Encounter (Signed)
FYI

## 2020-03-18 NOTE — Telephone Encounter (Signed)
Pt needs refill on Hydrocodone that will be ready for refill on Thursday Dec 16th. Pharm is [facility] [address]

## 2020-03-19 LAB — KAPPA/LAMBDA LIGHT CHAINS
Kappa free light chain: 72.6 mg/L — ABNORMAL HIGH (ref 3.3–19.4)
Kappa, lambda light chain ratio: 2.48 — ABNORMAL HIGH (ref 0.26–1.65)
Lambda free light chains: 29.3 mg/L — ABNORMAL HIGH (ref 5.7–26.3)

## 2020-03-19 LAB — IGG, IGA, IGM
IgA: 394 mg/dL — ABNORMAL HIGH (ref 90–386)
IgG (Immunoglobin G), Serum: 2046 mg/dL — ABNORMAL HIGH (ref 603–1613)
IgM (Immunoglobulin M), Srm: 164 mg/dL (ref 20–172)

## 2020-03-19 MED FILL — REPATHA SYRINGE 140 MG/ML SUBCUTANEOUS SYRINGE: 28 days supply | Qty: 2 | Fill #1 | Status: AC

## 2020-03-19 MED FILL — REPATHA SYRINGE 140 MG/ML SUBCUTANEOUS SYRINGE: SUBCUTANEOUS | 28 days supply | Qty: 2 | Fill #1

## 2020-03-19 NOTE — Telephone Encounter (Signed)
Patient notified

## 2020-03-20 DIAGNOSIS — I2510 Atherosclerotic heart disease of native coronary artery without angina pectoris: Principal | ICD-10-CM

## 2020-03-20 MED ORDER — EZETIMIBE 10 MG TABLET
ORAL_TABLET | 3 refills | 0 days | Status: CP
Start: 2020-03-20 — End: ?

## 2020-03-21 ENCOUNTER — Ambulatory Visit: Payer: Self-pay

## 2020-03-21 ENCOUNTER — Ambulatory Visit: Payer: Self-pay | Admitting: Oncology

## 2020-03-25 ENCOUNTER — Other Ambulatory Visit: Payer: BC Managed Care – PPO

## 2020-03-25 ENCOUNTER — Inpatient Hospital Stay (HOSPITAL_BASED_OUTPATIENT_CLINIC_OR_DEPARTMENT_OTHER): Payer: Self-pay | Admitting: Oncology

## 2020-03-25 ENCOUNTER — Inpatient Hospital Stay: Payer: Self-pay

## 2020-03-25 ENCOUNTER — Encounter: Payer: Self-pay | Admitting: Oncology

## 2020-03-25 VITALS — BP 134/88 | HR 82 | Temp 97.9°F | Resp 18 | Wt 252.7 lb

## 2020-03-25 DIAGNOSIS — R778 Other specified abnormalities of plasma proteins: Secondary | ICD-10-CM

## 2020-03-25 DIAGNOSIS — R718 Other abnormality of red blood cells: Secondary | ICD-10-CM

## 2020-03-25 NOTE — Progress Notes (Signed)
Patient here for follow up. No new concerns voiced.  °
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
## 2020-03-26 NOTE — Progress Notes (Signed)
[employer] Assistance thru [facility] was cancelled due to patients appointment on 03/25/2020. Patient's level improved to Infusion was not needed. Per MAP if patient needs assistance to call to re-open his case at that point, they have cancelled the Assistance request on their end and noted account.

## 2020-04-01 ENCOUNTER — Ambulatory Visit: Payer: BC Managed Care – PPO | Admitting: Oncology

## 2020-04-01 ENCOUNTER — Ambulatory Visit: Payer: BC Managed Care – PPO

## 2020-04-02 ENCOUNTER — Other Ambulatory Visit: Payer: Self-pay

## 2020-04-02 ENCOUNTER — Emergency Department
Admission: EM | Admit: 2020-04-02 | Discharge: 2020-04-02 | Disposition: A | Discharge status: Home / Self Care | Payer: Self-pay | Attending: Emergency Medicine | Admitting: Emergency Medicine

## 2020-04-02 ENCOUNTER — Emergency Department: Payer: Self-pay

## 2020-04-02 DIAGNOSIS — K219 Gastro-esophageal reflux disease without esophagitis: Secondary | ICD-10-CM

## 2020-04-02 DIAGNOSIS — Z951 Presence of aortocoronary bypass graft: Secondary | ICD-10-CM | POA: Insufficient documentation

## 2020-04-02 DIAGNOSIS — I509 Heart failure, unspecified: Secondary | ICD-10-CM | POA: Insufficient documentation

## 2020-04-02 DIAGNOSIS — I110 Hypertensive heart disease with heart failure: Secondary | ICD-10-CM | POA: Insufficient documentation

## 2020-04-02 DIAGNOSIS — Z87891 Personal history of nicotine dependence: Secondary | ICD-10-CM | POA: Insufficient documentation

## 2020-04-02 DIAGNOSIS — J45909 Unspecified asthma, uncomplicated: Secondary | ICD-10-CM | POA: Insufficient documentation

## 2020-04-02 DIAGNOSIS — R079 Chest pain, unspecified: Secondary | ICD-10-CM

## 2020-04-02 DIAGNOSIS — Z79899 Other long term (current) drug therapy: Secondary | ICD-10-CM | POA: Insufficient documentation

## 2020-04-02 DIAGNOSIS — Z7951 Long term (current) use of inhaled steroids: Secondary | ICD-10-CM | POA: Insufficient documentation

## 2020-04-02 LAB — BASIC METABOLIC PANEL
Anion gap: 11 (ref 5–15)
BUN: 18 mg/dL (ref 6–20)
CO2: 24 mmol/L (ref 22–32)
Calcium: 9 mg/dL (ref 8.9–10.3)
Chloride: 99 mmol/L (ref 98–111)
Creatinine, Ser: 1.03 mg/dL (ref 0.61–1.24)
GFR, Estimated: >60 mL/min (ref >60)
Glucose, Bld: 96 mg/dL (ref 70–99)
Potassium: 3.6 mmol/L (ref 3.5–5.1)
Sodium: 134 mmol/L — ABNORMAL LOW (ref 135–145)

## 2020-04-02 LAB — CBC
HCT: 41.4 % (ref 39.0–52.0)
Hemoglobin: 12.4 g/dL — ABNORMAL LOW (ref 13.0–17.0)
MCH: 21.3 pg — ABNORMAL LOW (ref 26.0–34.0)
MCHC: 30 g/dL (ref 30.0–36.0)
MCV: 71 fL — ABNORMAL LOW (ref 80.0–100.0)
Platelets: 426 10*3/uL — ABNORMAL HIGH (ref 150–400)
RBC: 5.83 MIL/uL — ABNORMAL HIGH (ref 4.22–5.81)
RDW: 18.5 % — ABNORMAL HIGH (ref 11.5–15.5)
WBC: 7.4 10*3/uL (ref 4.0–10.5)
nRBC: 0 % (ref 0.0–0.2)

## 2020-04-02 LAB — TROPONIN I (HIGH SENSITIVITY)
Troponin I (High Sensitivity): 3 ng/L (ref <18)
Troponin I (High Sensitivity): 3 ng/L (ref <18)

## 2020-04-02 MED ORDER — LIDOCAINE VISCOUS HCL 2 % MT SOLN
15.0000 mL | Freq: Once | OROMUCOSAL | Status: AC
  Administered 2020-04-02: 15 mL via ORAL
  Filled 2020-04-02: qty 15

## 2020-04-02 MED ORDER — ALUM & MAG HYDROXIDE-SIMETH 200-200-20 MG/5ML PO SUSP
30.0000 mL | Freq: Once | ORAL | Status: AC
  Administered 2020-04-02: 30 mL via ORAL
  Filled 2020-04-02: qty 30

## 2020-04-02 NOTE — ED Triage Notes (Signed)
Reports chest pressure to center that radiates to right intermittent for approx 5 minutes since 2PM today.  Pt alert and oriented X4, cooperative, RR even and unlabored, color WNL. Pt in NAD.
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
## 2020-04-02 NOTE — ED Notes (Signed)
Pt reports an episode of SOB while doing physical activity earlier today. Pt states episode lasted about 15-20 mins. Pt denies any SOB and CP at this time. Pt appears to be in NAD at this time. VSS. Awaiting further orders. Will continue to monitor.

## 2020-04-08 ENCOUNTER — Ambulatory Visit: Payer: Self-pay

## 2020-04-09 NOTE — Unmapped (Signed)
[facility] Refill Coordination Note    Specialty Medication(s) to be Shipped:   General Specialty: Repatha    Other medication(s) to be shipped: No additional medications requested for fill at this time     Ryan Hale, DOB: [birth year]  Phone: [phone] (home)       All above HIPAA information was verified with patient.     Was a Nurse, learning disability used for this call? No    Completed refill call assessment today to schedule patient's medication shipment from the [facility] [phone]).       Specialty medication(s) and dose(s) confirmed: Regimen is correct and unchanged.   Changes to medications: [name] reports no changes at this time.  Changes to insurance: No  Questions for the pharmacist: No    Confirmed patient received Welcome Packet with first shipment. The patient will receive a drug information handout for each medication shipped and additional FDA Medication Guides as required.       DISEASE/MEDICATION-SPECIFIC INFORMATION        For patients on injectable medications: Patient currently has 0 doses left.  Next injection is scheduled for 04/18/20.    SPECIALTY MEDICATION ADHERENCE     Medication Adherence    Patient reported X missed doses in the last month: 0  Specialty Medication: Repatha 140mg /ml  Patient is on additional specialty medications: No  Patient is on more than two specialty medications: No                Repatha 140 mg/ml: 0 days of medicine on hand         SHIPPING     Shipping address confirmed in Epic.     Delivery Scheduled: Yes, Expected medication delivery date: 04/15/20.     Medication will be delivered via Same Day Courier to the prescription address in Epic WAM.    Nancy Nordmann [facility] Specialty Technician

## 2020-04-10 DIAGNOSIS — Z87828 Personal history of other (healed) physical injury and trauma: Principal | ICD-10-CM

## 2020-04-10 DIAGNOSIS — Z7982 Long term (current) use of aspirin: Principal | ICD-10-CM

## 2020-04-10 DIAGNOSIS — F419 Anxiety disorder, unspecified: Principal | ICD-10-CM

## 2020-04-10 DIAGNOSIS — Z7951 Long term (current) use of inhaled steroids: Principal | ICD-10-CM

## 2020-04-10 DIAGNOSIS — M792 Neuralgia and neuritis, unspecified: Principal | ICD-10-CM

## 2020-04-10 DIAGNOSIS — I4891 Unspecified atrial fibrillation: Principal | ICD-10-CM

## 2020-04-10 DIAGNOSIS — G8929 Other chronic pain: Principal | ICD-10-CM

## 2020-04-10 DIAGNOSIS — I252 Old myocardial infarction: Principal | ICD-10-CM

## 2020-04-10 DIAGNOSIS — I2510 Atherosclerotic heart disease of native coronary artery without angina pectoris: Principal | ICD-10-CM

## 2020-04-10 DIAGNOSIS — I10 Essential (primary) hypertension: Principal | ICD-10-CM

## 2020-04-10 DIAGNOSIS — R0789 Other chest pain: Principal | ICD-10-CM

## 2020-04-10 DIAGNOSIS — Z79899 Other long term (current) drug therapy: Principal | ICD-10-CM

## 2020-04-10 DIAGNOSIS — Z87891 Personal history of nicotine dependence: Principal | ICD-10-CM

## 2020-04-10 DIAGNOSIS — E785 Hyperlipidemia, unspecified: Principal | ICD-10-CM

## 2020-04-10 LAB — COMPREHENSIVE METABOLIC PANEL
ALBUMIN: 3.6 g/dL (ref 3.4–5.0)
ALKALINE PHOSPHATASE: 144 U/L — ABNORMAL HIGH (ref 46–116)
ALT (SGPT): 37 U/L (ref 10–49)
ANION GAP: 8 mmol/L (ref 5–14)
AST (SGOT): 19 U/L (ref <=34)
BILIRUBIN TOTAL: 0.2 mg/dL — ABNORMAL LOW (ref 0.3–1.2)
BLOOD UREA NITROGEN: 19 mg/dL (ref 9–23)
BUN / CREAT RATIO: 16
CALCIUM: 9 mg/dL (ref 8.7–10.4)
CHLORIDE: 105 mmol/L (ref 98–107)
CO2: 23.1 mmol/L (ref 20.0–31.0)
CREATININE: 1.16 mg/dL — ABNORMAL HIGH
EGFR CKD-EPI AA MALE: 89 mL/min/{1.73_m2} (ref >=60)
EGFR CKD-EPI NON-AA MALE: 77 mL/min/{1.73_m2} (ref >=60)
GLUCOSE RANDOM: 116 mg/dL (ref 70–179)
POTASSIUM: 3.2 mmol/L — ABNORMAL LOW (ref 3.4–4.5)
PROTEIN TOTAL: 8.3 g/dL — ABNORMAL HIGH (ref 5.7–8.2)
SODIUM: 136 mmol/L (ref 135–145)

## 2020-04-10 LAB — PROTIME-INR
INR: 1.19
PROTIME: 13.9 s — ABNORMAL HIGH (ref 10.3–13.4)

## 2020-04-10 LAB — CBC W/ AUTO DIFF
BASOPHILS ABSOLUTE COUNT: 0.1 10*9/L (ref 0.0–0.1)
BASOPHILS RELATIVE PERCENT: 1.2 %
EOSINOPHILS ABSOLUTE COUNT: 0.2 10*9/L (ref 0.0–0.7)
EOSINOPHILS RELATIVE PERCENT: 3.4 %
HEMATOCRIT: 39.5 % (ref 38.0–50.0)
HEMOGLOBIN: 12.5 g/dL — ABNORMAL LOW (ref 13.5–17.5)
LYMPHOCYTES ABSOLUTE COUNT: 2.1 10*9/L (ref 0.7–4.0)
LYMPHOCYTES RELATIVE PERCENT: 44.1 %
MEAN CORPUSCULAR HEMOGLOBIN CONC: 31.5 g/dL (ref 30.0–36.0)
MEAN CORPUSCULAR HEMOGLOBIN: 21.2 pg — ABNORMAL LOW (ref 26.0–34.0)
MEAN CORPUSCULAR VOLUME: 67.4 fL — ABNORMAL LOW (ref 81.0–95.0)
MEAN PLATELET VOLUME: 7.4 fL (ref 7.0–10.0)
MONOCYTES ABSOLUTE COUNT: 0.6 10*9/L (ref 0.1–1.0)
MONOCYTES RELATIVE PERCENT: 12.6 %
NEUTROPHILS ABSOLUTE COUNT: 1.8 10*9/L (ref 1.7–7.7)
NEUTROPHILS RELATIVE PERCENT: 38.7 %
NUCLEATED RED BLOOD CELLS: 0 /100{WBCs} (ref <=4)
PLATELET COUNT: 390 10*9/L (ref 150–450)
RED BLOOD CELL COUNT: 5.87 10*12/L — ABNORMAL HIGH (ref 4.32–5.72)
RED CELL DISTRIBUTION WIDTH: 18.2 % — ABNORMAL HIGH (ref 12.0–15.0)
WBC ADJUSTED: 4.8 10*9/L (ref 3.5–10.5)

## 2020-04-10 LAB — APTT
APTT: 40.1 s — ABNORMAL HIGH (ref 25.3–37.1)
HEPARIN CORRELATION: 0.2

## 2020-04-10 LAB — HIGH SENSITIVITY TROPONIN I - SERIAL: HIGH SENSITIVITY TROPONIN I: <3 ng/L (ref <=53)

## 2020-04-10 MED ORDER — OMEPRAZOLE 20 MG CAPSULE,DELAYED RELEASE
ORAL_CAPSULE | Freq: Every day | ORAL | 0 refills | 28 days | Status: CP
Start: 2020-04-10 — End: 2020-05-08

## 2020-04-11 ENCOUNTER — Encounter
Admit: 2020-04-11 | Discharge: 2020-04-11 | Disposition: A | Discharge status: Home / Self Care | Payer: BLUE CROSS/BLUE SHIELD | Attending: Emergency Medicine

## 2020-04-11 NOTE — Unmapped (Addendum)
Pt ambulatory to triage for left sided chest pain.  Pt took ASA and 1 nitroglycerin with minimal relief.
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
## 2020-04-12 NOTE — Unmapped (Signed)
DIVISION OF CARDIOLOGY  [facility] [employer], [place]  Date of Service: 04/16/2020  Assessment/Plan   1. CAD  Patient is s/p PCI to LAD & RPL (12/2017)??in setting of unstable angina.??He has known??CTO of LCx that would be a high risk intervention. Long hx of atypical CP/reflux w/ multiple ED visits. Normal nuclear stress 08/2018. Doing well, exercising w/o CP.    - metoprolol stopped 2/2 sexual side effects   - continue ASA  - lipid mgmt per below  - encouraged regular exercise    2. Hyperlipidemia, unspecified hyperlipidemia type  Hx of statin intolerance/myalgias. Now on zetia & Repatha.    Lab Results   Component Value Date    LDL 82.9 12/07/2019    LDL 68 12/07/2019     3. HCM  Flu shot given  Recommended to get covid booster asap    Return in about 6 months (around 10/14/2020).     I personally spent 25 minutes face-to-face and non-face-to-face in the care of this patient, which includes all pre, intra, and post visit time on the date of service.    Subjective:   [id number]  Chief complaint:  45 y.o. Hale with a history of CAD, statin intolerance and recurring atypical chest pain with multiple ED visits who presents for follow up.     History of Present Illness:  Patient is s/p PCI to LAD & RPL (12/2017)??in setting of unstable angina.??He has known??CTO of LCx that would be a high risk intervention. Long hx of atypical CP w/ multiple ED visits. Normal nuclear stress 08/2018.    Patient was last seen by me on 12/07/19. Lipid panel was checked which showed LDL direct of 82.9. Patient with recent ED visit on 04/10/20 for evaluation of left sided chest pain that occurred about an hour after eating dinner. Labs from triage were reassuring, hsTroponin negative, and basic labs comparable to baseline. Discharged on omeprazole 20 mg.    Patient presents today endorsing some episodes of chest discomfort, that was told. He reports he saw GI, with an overall normal workup. Now taking reflux medication. He reports he believes episodes are brought on through use of hydrocodone, which he takes for nerve pain. Episodes have relieved since some since he stopped hydrocodone. This morning he walked on the treadmill 1 mile, and then on the bike for 3 miles with very good tolerance. Denies LEE. Tolerating Repatha well. 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 Trying to lose weight, reports he is eating 2 meals a day. For the past couple weeks he has tried to eat more salads but does eat food that he cooks for his food truck including a lot of fried foods. Has not received COVID-19 booster yet. Received flu vaccine in clinic today.      Patient Active Problem List   Diagnosis   ??? Chest pain   ??? HTN (hypertension)   ??? Anxiety   ??? HLD (hyperlipidemia)   ??? GERD (gastroesophageal reflux disease)   ??? Coronary artery disease involving native coronary artery of native heart without angina pectoris   ??? Adjustment disorder with mixed anxiety and depressed mood   ??? OSA (obstructive sleep apnea)   ??? Near syncope   ??? Statin intolerance   ??? COVID-19 virus infection       Medications:  Current Outpatient Medications   Medication Sig Dispense Refill   ??? acetaminophen (TYLENOL) 500 MG tablet Take 500 mg by mouth every four (4) hours as needed.      ???  albuterol HFA 90 mcg/actuation inhaler Inhale 2 puffs every six (6) hours as needed.      ??? aspirin 81 MG chewable tablet Chew 1 tablet (81 mg total) daily. 30 tablet 11   ??? chlorthalidone (HYGROTON) 25 MG tablet TAKE 1 TABLET(25 MG) BY MOUTH DAILY 30 tablet 6   ??? diclofenac sodium (VOLTAREN) 1 % gel Apply 2 g topically two (2) times a day as needed.      ??? evolocumab (REPATHA SYRINGE) 140 mg/mL Syrg Inject the contents of 1 syringe (140 mg) under the skin every fourteen (14) days. 2 mL 5   ??? ezetimibe (ZETIA) 10 mg tablet TAKE 1 TABLET(10 MG) BY MOUTH DAILY 90 tablet 3   ??? fluticasone propionate (FLONASE) 50 mcg/actuation nasal spray 2 sprays into each nostril daily. 16 g 0   ??? HYDROcodone-acetaminophen (NORCO) 5-325 mg per tablet Take 0.5-1 tablets by mouth every six (6) hours as needed.      ??? lidocaine (LIDODERM) 5 % patch Place 1 patch on the skin.     ??? loratadine (CLARITIN) 10 mg tablet Take 10 mg by mouth daily as needed.      ??? nitroglycerin (NITROSTAT) 0.4 MG SL tablet Place 1 tablet (0.4 mg total) under the tongue every five (5) minutes as needed for chest pain. 30 tablet 2   ??? omeprazole (PRILOSEC) 20 MG capsule Take 1 capsule (20 mg total) by mouth daily for 28 days. 28 capsule 0   ??? pregabalin (LYRICA) 25 MG capsule Take 25 mg poqam and 50 mg poqpm for chronic pain     ??? sertraline (ZOLOFT) 25 MG tablet Take 25 mg by mouth daily.     ??? budesonide-formoteroL (SYMBICORT) 160-4.5 mcg/actuation inhaler Inhale 2 puffs. (Patient not taking: Reported on 04/16/2020)     ??? DEXILANT 60 mg capsule daily.  (Patient not taking: Reported on 04/16/2020)     ??? TiZANidine (ZANAFLEX) 4 MG capsule Take 4 mg by mouth Three (3) times a day as needed.  (Patient not taking: Reported on 12/07/2019)       No current facility-administered medications for this visit.       Allergies  Allergies   Allergen Reactions   ??? Ace Inhibitors Swelling   ??? Lisinopril Swelling   ??? Norvasc [Amlodipine] Palpitations       Social History:   Social History     Tobacco Use   ??? Smoking status: Former Smoker     Packs/day: 0.75     Years: 9.00     Pack years: 6.75     Types: Cigarettes   ??? Smokeless tobacco: Never Used   Vaping Use   ??? Vaping Use: Never used   Substance Use Topics   ??? Alcohol use: No   ??? Drug use: No       Family History:  Family History   Problem Relation Age of Onset   ??? Heart attack Father    ??? Liver disease Mother        ROS- 12 system review is negative other than what is specified in the History of Present Illness.      Objective:   Physical Exam  Vitals:    04/16/20 1547   BP: 134/72   BP Site: L Arm   BP Position: Sitting   BP Cuff Size: Large   Pulse: 72   Temp: 36.2 ??C (97.2 ??F)   TempSrc: Temporal Weight: (!) 113.8 kg (250 lb 12.8 oz)     General-  Normal appearing Hale in no apparent distress.  Neurologic- Alert and oriented X3.  Cranial nerve II-XII grossly intact.  HEENT-  Normocephalic atraumatic head.  No scleral icterus.  Wearing face mask.  Neck- Supple, no carotid bruis, no JVD.  Lungs- Clear to auscultation, no wheezes, rhonchi, or rhales.  Heart-  RRR, no MRG.  Abdomen- Soft, nontender, no organomegally.  Extremities-  No clubbing or cyanosis.  No pitting edema to LE bilaterally  Pulses- 2+ pulses in radial and dorsalis pedis bilaterally.  Psych- Normal mood, appropriate.      Laboratory data:      Component Value Date/Time    PROBNP 28.5 11/21/2018 2029       Lab Results   Component Value Date    WBC 4.8 04/10/2020    HGB 12.5 (L) 04/10/2020    HCT Ryan.5 04/10/2020    PLT 390 04/10/2020       Lab Results   Component Value Date    NA 136 04/10/2020    K 3.2 (L) 04/10/2020    CL 105 04/10/2020    CO2 23.1 04/10/2020    BUN 19 04/10/2020    CREATININE 1.16 (H) 04/10/2020    GLU 116 04/10/2020    CALCIUM 9.0 04/10/2020    MG 2.0 08/28/2019       Lab Results   Component Value Date    TSH 2.455 11/30/2018    ALBUMIN 3.6 04/10/2020    ALT 37 04/10/2020    AST 19 04/10/2020    INR 1.19 04/10/2020     Electrocardiogram:  From January 2022 showed sinus tachycardia, 112 bpm, otherwise normal ECG.    Echocardiogram:  From September 2019 at [facility] showed mild LVH, normal systolic function, EF 55-60%. Mild MR.    Repeat from December 2019 showed normal LV function with EF>55%. Showed mild LVH.     Nuclear stress test:  From February 2019 at [facility] was a normal myocardial perfusion scan with no evidence of stress-induced myocardial ischemia. EF 59%.    From May 2020 was a normal myocardial perfusion study. Showed no evidence of significant ischemia or scar. Post stress showed EF >65%.     Cardiac catheterization:  From September 2019 showed:  1. Mild lateral wall hypokinesis with preserved LV systolic function  2. Coronary artery disease including 50% mid-RCA stenosis, 99% rPL stenosis, occluded proximal circumflex, 70% mid-PDA stenosis and 90% distal LAD stenosis  3. Successful PCI of??distal??LAD??with placement of an Onyx drug eluting stent    From September 2019 showed patient stent in mid/distal LAD, LCx is occluded proximally just distal to the OM1 takeoff, OM1 has mild diffuse disease up to 30% in the mid portion, diffuse disease of the RCA with 50% mid stenosis, 99% rPL stenosis, and 60% mid-PDA stenosis. Successful PCI to RPL.    Zio patch:  From June 2021 showed patient had a min HR of 28 bpm, max HR of 168 bpm, and avg HR of 82 bpm. Predominant underlying rhythm was SR. 1 episode(s) of AV Block (2nd?? Mobitz II) occurred, lasting a total of 2 secs. Isolated SVEs were rare (<1.0%, 1492), SVE Couplets were rare (<1.0%, 4), and no SVE Triplets were present. Isolated VEs were rare (<1.0%, 5), and no VE Couplets or VE Triplets were present. There were 22 patient triggered events/diary entries. The majority of them corresponded to sinus rhythm with PAC. ??A few episodes corresponded to SR.    Lipid panel:  Component      Latest  Ref Rng & Units 07/31/2019   LDL Direct      60.0 - 99.0 mg/dL 16.1       This note was entered by Lourena Simmonds acting as scribe for [employer], AGNP-C.  Signature: JRS  Date: 04/16/2020  Time: 4:40 PM     I have reviewed the documentation provided by the scribe and confirm that it accurately reflects the service I personally performed and the decisions made by me.  Signature: CJR  Date: 04/16/2020  Time: 4:40 PM
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
## 2020-04-15 MED FILL — REPATHA SYRINGE 140 MG/ML SUBCUTANEOUS SYRINGE: SUBCUTANEOUS | 28 days supply | Qty: 2 | Fill #2

## 2020-04-16 ENCOUNTER — Ambulatory Visit
Admit: 2020-04-16 | Discharge: 2020-04-17 | Payer: BLUE CROSS/BLUE SHIELD | Attending: Adult Health | Primary: Adult Health

## 2020-04-16 DIAGNOSIS — I10 Essential (primary) hypertension: Principal | ICD-10-CM

## 2020-04-16 DIAGNOSIS — E785 Hyperlipidemia, unspecified: Principal | ICD-10-CM

## 2020-04-16 DIAGNOSIS — Z0000 Encounter for general adult medical examination without abnormal findings: Principal | ICD-10-CM

## 2020-04-16 DIAGNOSIS — I2510 Atherosclerotic heart disease of native coronary artery without angina pectoris: Principal | ICD-10-CM

## 2020-04-16 NOTE — Unmapped (Signed)
Pt reports he has been riding his exercise bike and does not have chest  pain  With exertion, here to follow up after ED visit for chest pain on 1/5, 03/05/20, no pain since then

## 2020-04-19 ENCOUNTER — Other Ambulatory Visit: Payer: Self-pay | Admitting: Family Medicine

## 2020-04-19 DIAGNOSIS — Z79899 Other long term (current) drug therapy: Secondary | ICD-10-CM

## 2020-04-19 DIAGNOSIS — Z79891 Long term (current) use of opiate analgesic: Secondary | ICD-10-CM

## 2020-04-19 DIAGNOSIS — G8929 Other chronic pain: Secondary | ICD-10-CM

## 2020-04-19 MED ORDER — HYDROCODONE-ACETAMINOPHEN 5-325 MG PO TABS: ORAL_TABLET | ORAL | 0 refills | Status: DC

## 2020-04-19 MED ORDER — HYDROCODONE-ACETAMINOPHEN 5-325 MG PO TABS: 0.5000 | ORAL_TABLET | Freq: Two times a day (BID) | ORAL | 0 refills | Status: DC | PRN

## 2020-05-03 NOTE — Unmapped (Signed)
[facility] Refill Coordination Note    Specialty Medication(s) to be Shipped:   General Specialty: Repatha    Other medication(s) to be shipped: No additional medications requested for fill at this time     Ryan Hale, DOB: Nov 04, 1975  Phone: [phone] (home)       All above HIPAA information was verified with patient.     Was a Nurse, learning disability used for this call? No    Completed refill call assessment today to schedule patient's medication shipment from the [facility] [phone]).       Specialty medication(s) and dose(s) confirmed: Regimen is correct and unchanged.   Changes to medications: Codie reports no changes at this time.  Changes to insurance: No  Questions for the pharmacist: No    Confirmed patient received Welcome Packet with first shipment. The patient will receive a drug information handout for each medication shipped and additional FDA Medication Guides as required.       DISEASE/MEDICATION-SPECIFIC INFORMATION        For patients on injectable medications: Patient currently has 0 doses left.  Next injection is scheduled for 05/17/20.    SPECIALTY MEDICATION ADHERENCE     Medication Adherence    Patient reported X missed doses in the last month: 0  Specialty Medication: Repatha 140mg /ml  Patient is on additional specialty medications: No  Patient is on more than two specialty medications: No                Repatha 140 mg/ml: 0 days of medicine on hand         SHIPPING     Shipping address confirmed in Epic.     Delivery Scheduled: Yes, Expected medication delivery date: 05/14/20.     Medication will be delivered via Same Day Courier to the prescription address in Epic WAM.    Nancy Nordmann [facility] Specialty Technician

## 2020-05-14 MED FILL — REPATHA SYRINGE 140 MG/ML SUBCUTANEOUS SYRINGE: SUBCUTANEOUS | 28 days supply | Qty: 2 | Fill #3

## 2020-05-16 ENCOUNTER — Ambulatory Visit (INDEPENDENT_AMBULATORY_CARE_PROVIDER_SITE_OTHER): Payer: BLUE CROSS/BLUE SHIELD | Admitting: Family Medicine

## 2020-05-16 ENCOUNTER — Other Ambulatory Visit: Payer: Self-pay

## 2020-05-16 ENCOUNTER — Encounter: Payer: Self-pay | Admitting: Family Medicine

## 2020-05-16 VITALS — BP 128/72 | HR 84 | Temp 98.0°F | Resp 18 | Ht 71.0 in | Wt 256.4 lb

## 2020-05-16 DIAGNOSIS — G8929 Other chronic pain: Secondary | ICD-10-CM

## 2020-05-16 DIAGNOSIS — R52 Pain, unspecified: Secondary | ICD-10-CM

## 2020-05-16 DIAGNOSIS — Z79891 Long term (current) use of opiate analgesic: Secondary | ICD-10-CM | POA: Diagnosis not present

## 2020-05-16 DIAGNOSIS — Z79899 Other long term (current) drug therapy: Secondary | ICD-10-CM

## 2020-05-16 DIAGNOSIS — T07XXXA Unspecified multiple injuries, initial encounter: Secondary | ICD-10-CM

## 2020-05-16 DIAGNOSIS — F419 Anxiety disorder, unspecified: Secondary | ICD-10-CM

## 2020-05-16 DIAGNOSIS — I272 Pulmonary hypertension, unspecified: Secondary | ICD-10-CM

## 2020-05-16 DIAGNOSIS — M79604 Pain in right leg: Secondary | ICD-10-CM

## 2020-05-16 DIAGNOSIS — I10 Essential (primary) hypertension: Secondary | ICD-10-CM

## 2020-05-16 DIAGNOSIS — K219 Gastro-esophageal reflux disease without esophagitis: Secondary | ICD-10-CM

## 2020-05-16 DIAGNOSIS — E782 Mixed hyperlipidemia: Secondary | ICD-10-CM

## 2020-05-16 DIAGNOSIS — R079 Chest pain, unspecified: Secondary | ICD-10-CM

## 2020-05-16 DIAGNOSIS — M79605 Pain in left leg: Secondary | ICD-10-CM

## 2020-05-16 DIAGNOSIS — Z5181 Encounter for therapeutic drug level monitoring: Secondary | ICD-10-CM

## 2020-05-16 DIAGNOSIS — I2510 Atherosclerotic heart disease of native coronary artery without angina pectoris: Secondary | ICD-10-CM

## 2020-05-16 DIAGNOSIS — I480 Paroxysmal atrial fibrillation: Secondary | ICD-10-CM

## 2020-05-16 MED ORDER — HYDROCODONE-ACETAMINOPHEN 5-325 MG PO TABS: 0.5000 | ORAL_TABLET | Freq: Two times a day (BID) | ORAL | 0 refills | Status: DC | PRN

## 2020-05-16 MED ORDER — HYDROCODONE-ACETAMINOPHEN 5-325 MG PO TABS: ORAL_TABLET | ORAL | 0 refills | Status: DC
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
## 2020-05-18 LAB — DRUG MONITOR, PANEL 1, W/CONF, URINE
Amphetamines: NEGATIVE ng/mL (ref <500)
Barbiturates: NEGATIVE ng/mL (ref <300)
Benzodiazepines: NEGATIVE ng/mL (ref <100)
Cocaine Metabolite: NEGATIVE ng/mL (ref <150)
Codeine: NEGATIVE ng/mL (ref <50)
Creatinine: 124.8 mg/dL
Hydrocodone: 281 ng/mL — ABNORMAL HIGH (ref <50)
Hydromorphone: 152 ng/mL — ABNORMAL HIGH (ref <50)
Marijuana Metabolite: NEGATIVE ng/mL (ref <20)
Methadone Metabolite: NEGATIVE ng/mL (ref <100)
Morphine: NEGATIVE ng/mL (ref <50)
Norhydrocodone: 514 ng/mL — ABNORMAL HIGH (ref <50)
Opiates: POSITIVE ng/mL — AB (ref <100)
Oxidant: NEGATIVE ug/mL
Oxycodone: NEGATIVE ng/mL (ref <100)
Phencyclidine: NEGATIVE ng/mL (ref <25)
pH: 6.9 (ref 4.5–9.0)

## 2020-05-18 LAB — DM TEMPLATE

## 2020-05-23 ENCOUNTER — Ambulatory Visit: Payer: BLUE CROSS/BLUE SHIELD | Attending: Family Medicine

## 2020-05-27 ENCOUNTER — Ambulatory Visit: Payer: BLUE CROSS/BLUE SHIELD

## 2020-05-30 ENCOUNTER — Emergency Department
Admit: 2020-05-30 | Discharge: 2020-05-30 | Disposition: A | Discharge status: Home / Self Care | Payer: BLUE CROSS/BLUE SHIELD | Attending: Emergency Medicine

## 2020-05-30 ENCOUNTER — Encounter
Admit: 2020-05-30 | Discharge: 2020-05-30 | Disposition: A | Discharge status: Home / Self Care | Payer: BLUE CROSS/BLUE SHIELD | Attending: Emergency Medicine

## 2020-05-30 DIAGNOSIS — R079 Chest pain, unspecified: Principal | ICD-10-CM

## 2020-05-30 LAB — PROTIME-INR
INR: 1.16
PROTIME: 13.5 s — ABNORMAL HIGH (ref 10.3–13.4)

## 2020-05-30 LAB — BASIC METABOLIC PANEL
ANION GAP: 5 mmol/L (ref 5–14)
BLOOD UREA NITROGEN: 10 mg/dL (ref 9–23)
BUN / CREAT RATIO: 10
CALCIUM: 9 mg/dL (ref 8.7–10.4)
CHLORIDE: 104 mmol/L (ref 98–107)
CO2: 27.7 mmol/L (ref 20.0–31.0)
CREATININE: 1.02 mg/dL
EGFR CKD-EPI AA MALE: >90 mL/min/{1.73_m2} (ref >=60)
EGFR CKD-EPI NON-AA MALE: 90 mL/min/{1.73_m2} (ref >=60)
GLUCOSE RANDOM: 100 mg/dL (ref 70–179)
POTASSIUM: 3.4 mmol/L (ref 3.4–4.5)
SODIUM: 137 mmol/L (ref 135–145)

## 2020-05-30 LAB — CBC W/ AUTO DIFF
BASOPHILS ABSOLUTE COUNT: 0 10*9/L (ref 0.0–0.1)
BASOPHILS RELATIVE PERCENT: 0.8 %
EOSINOPHILS ABSOLUTE COUNT: 0.2 10*9/L (ref 0.0–0.7)
EOSINOPHILS RELATIVE PERCENT: 2.8 %
HEMATOCRIT: 37.8 % — ABNORMAL LOW (ref 38.0–50.0)
HEMOGLOBIN: 12 g/dL — ABNORMAL LOW (ref 13.5–17.5)
LYMPHOCYTES ABSOLUTE COUNT: 2.1 10*9/L (ref 0.7–4.0)
LYMPHOCYTES RELATIVE PERCENT: 37.7 %
MEAN CORPUSCULAR HEMOGLOBIN CONC: 31.8 g/dL (ref 30.0–36.0)
MEAN CORPUSCULAR HEMOGLOBIN: 21.6 pg — ABNORMAL LOW (ref 26.0–34.0)
MEAN CORPUSCULAR VOLUME: 68.1 fL — ABNORMAL LOW (ref 81.0–95.0)
MEAN PLATELET VOLUME: 7.3 fL (ref 7.0–10.0)
MONOCYTES ABSOLUTE COUNT: 0.6 10*9/L (ref 0.1–1.0)
MONOCYTES RELATIVE PERCENT: 11.3 %
NEUTROPHILS ABSOLUTE COUNT: 2.6 10*9/L (ref 1.7–7.7)
NEUTROPHILS RELATIVE PERCENT: 47.4 %
NUCLEATED RED BLOOD CELLS: 0 /100{WBCs} (ref <=4)
PLATELET COUNT: 448 10*9/L (ref 150–450)
RED BLOOD CELL COUNT: 5.55 10*12/L (ref 4.32–5.72)
RED CELL DISTRIBUTION WIDTH: 18.3 % — ABNORMAL HIGH (ref 12.0–15.0)
WBC ADJUSTED: 5.5 10*9/L (ref 3.5–10.5)

## 2020-05-30 LAB — APTT
APTT: 40.6 s — ABNORMAL HIGH (ref 24.9–36.9)
HEPARIN CORRELATION: 0.2

## 2020-05-30 LAB — HIGH SENSITIVITY TROPONIN I - SINGLE: HIGH SENSITIVITY TROPONIN I: 3 ng/L (ref <=53)

## 2020-05-30 LAB — HIGH SENSITIVITY TROPONIN I - SERIAL: HIGH SENSITIVITY TROPONIN I: 3 ng/L (ref <=53)

## 2020-05-30 MED ADMIN — acetaminophen (TYLENOL) tablet 1,000 mg: 1000 mg | ORAL | @ 12:00:00 | Stop: 2020-05-30

## 2020-05-30 MED ADMIN — aspirin chewable tablet 324 mg: 324 mg | ORAL | @ 12:00:00 | Stop: 2020-05-30

## 2020-05-30 NOTE — Unmapped (Signed)
Patient c/o sub sternal cp, started a couple days ago, describes as dull achey pain, radiates to back. 3/10. Nausea. No sob noted. No cough or fevers.
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
## 2020-06-04 ENCOUNTER — Ambulatory Visit: Payer: BLUE CROSS/BLUE SHIELD

## 2020-06-04 NOTE — Unmapped (Signed)
[facility] Refill Coordination Note    Specialty Medication(s) to be Shipped:   General Specialty: Repatha    Other medication(s) to be shipped: No additional medications requested for fill at this time     Ryan Hale, DOB: [birth year]  Phone: [phone] (home)       All above HIPAA information was verified with patient.     Was a Nurse, learning disability used for this call? No    Completed refill call assessment today to schedule patient's medication shipment from the [facility] [phone]).       Specialty medication(s) and dose(s) confirmed: Regimen is correct and unchanged.   Changes to medications: Ryan Hale reports no changes at this time.  Changes to insurance: No  Questions for the pharmacist: No    Confirmed patient received Welcome Packet with first shipment. The patient will receive a drug information handout for each medication shipped and additional FDA Medication Guides as required.       DISEASE/MEDICATION-SPECIFIC INFORMATION        For patients on injectable medications: Patient currently has 0 doses left.  Next injection is scheduled for 06/13/20.    SPECIALTY MEDICATION ADHERENCE     Medication Adherence    Patient reported X missed doses in the last month: 0  Specialty Medication: Repatha 140mg /ml  Patient is on additional specialty medications: No  Patient is on more than two specialty medications: No                Repatha 140 mg/ml: 0 days of medicine on hand         SHIPPING     Shipping address confirmed in Epic.     Delivery Scheduled: Yes, Expected medication delivery date: 06/10/20.     Medication will be delivered via Same Day Courier to the prescription address in Epic WAM.    Ryan Hale [facility] Specialty Technician

## 2020-06-10 MED FILL — REPATHA SYRINGE 140 MG/ML SUBCUTANEOUS SYRINGE: SUBCUTANEOUS | 28 days supply | Qty: 2 | Fill #4

## 2020-06-28 NOTE — Unmapped (Signed)
[facility] Refill Coordination Note    Specialty Medication(s) to be Shipped:   General Specialty: Repatha    Other medication(s) to be shipped: No additional medications requested for fill at this time     Ryan Hale, DOB: 12-Jul-1975  Phone: [phone] (home)       All above HIPAA information was verified with patient.     Was a Nurse, learning disability used for this call? No    Completed refill call assessment today to schedule patient's medication shipment from the [facility] [phone]).       Specialty medication(s) and dose(s) confirmed: Regimen is correct and unchanged.   Changes to medications: Ryan Hale reports no changes at this time.  Changes to insurance: No  Questions for the pharmacist: No    Confirmed patient received a Conservation officer, historic buildings and a Surveyor, mining with first shipment. The patient will receive a drug information handout for each medication shipped and additional FDA Medication Guides as required.       DISEASE/MEDICATION-SPECIFIC INFORMATION        For patients on injectable medications: Patient currently has 0 doses left.  Next injection is scheduled for 07/11/20.    SPECIALTY MEDICATION ADHERENCE     Medication Adherence    Patient reported X missed doses in the last month: 0  Specialty Medication: Repatha 140mg /ml  Patient is on additional specialty medications: No  Patient is on more than two specialty medications: No                Repatha 140 mg/ml: 0 days of medicine on hand       SHIPPING     Shipping address confirmed in Epic.     Delivery Scheduled: Yes, Expected medication delivery date: 07/08/20.     Medication will be delivered via Same Day Courier to the prescription address in Epic WAM.    Ryan Hale [facility] Specialty Technician

## 2020-07-02 ENCOUNTER — Encounter: Payer: Self-pay | Admitting: Family Medicine

## 2020-07-04 NOTE — Telephone Encounter (Signed)
I would recommend gradually tapering down the dose - for example for a few days up to a week take 1/2 dose, then skip every other dose to get off without much withdrawal symptoms He could expect possibly some increased pain, jitters/tremors, nausea, diarrhea  He can look up NA for just info or local support (not that he is addicted but just that there may be resources there)

## 2020-07-08 MED FILL — REPATHA SYRINGE 140 MG/ML SUBCUTANEOUS SYRINGE: SUBCUTANEOUS | 28 days supply | Qty: 2 | Fill #5

## 2020-07-16 ENCOUNTER — Other Ambulatory Visit: Payer: Self-pay

## 2020-07-16 DIAGNOSIS — G4700 Insomnia, unspecified: Secondary | ICD-10-CM

## 2020-07-16 DIAGNOSIS — F419 Anxiety disorder, unspecified: Secondary | ICD-10-CM

## 2020-07-16 DIAGNOSIS — F329 Major depressive disorder, single episode, unspecified: Secondary | ICD-10-CM

## 2020-07-16 MED ORDER — SERTRALINE HCL 25 MG PO TABS: 25.0000 mg | ORAL_TABLET | Freq: Every day | ORAL | 0 refills | Status: DC

## 2020-07-29 MED ORDER — CHLORTHALIDONE 25 MG TABLET
ORAL_TABLET | 1 refills | 0 days | Status: CP
Start: 2020-07-29 — End: ?

## 2020-07-31 ENCOUNTER — Encounter: Admit: 2020-07-31 | Discharge: 2020-08-01 | Discharge status: Against Medical Advice | Payer: BLUE CROSS/BLUE SHIELD

## 2020-07-31 DIAGNOSIS — E785 Hyperlipidemia, unspecified: Principal | ICD-10-CM

## 2020-07-31 DIAGNOSIS — I2510 Atherosclerotic heart disease of native coronary artery without angina pectoris: Principal | ICD-10-CM

## 2020-07-31 LAB — COMPREHENSIVE METABOLIC PANEL
ALBUMIN: 3.5 g/dL (ref 3.4–5.0)
ALKALINE PHOSPHATASE: 169 U/L — ABNORMAL HIGH (ref 46–116)
ALT (SGPT): 52 U/L — ABNORMAL HIGH (ref 10–49)
ANION GAP: 6 mmol/L (ref 5–14)
AST (SGOT): 38 U/L — ABNORMAL HIGH (ref <=34)
BILIRUBIN TOTAL: 0.3 mg/dL (ref 0.3–1.2)
BLOOD UREA NITROGEN: 17 mg/dL (ref 9–23)
BUN / CREAT RATIO: 15
CALCIUM: 9.1 mg/dL (ref 8.7–10.4)
CHLORIDE: 101 mmol/L (ref 98–107)
CO2: 28.2 mmol/L (ref 20.0–31.0)
CREATININE: 1.12 mg/dL — ABNORMAL HIGH
EGFR CKD-EPI AA MALE: >90 mL/min/{1.73_m2} (ref >=60)
EGFR CKD-EPI NON-AA MALE: 79 mL/min/{1.73_m2} (ref >=60)
GLUCOSE RANDOM: 109 mg/dL (ref 70–179)
POTASSIUM: 3.6 mmol/L (ref 3.4–4.8)
PROTEIN TOTAL: 8.7 g/dL — ABNORMAL HIGH (ref 5.7–8.2)
SODIUM: 135 mmol/L (ref 135–145)

## 2020-07-31 LAB — CBC W/ AUTO DIFF
BASOPHILS ABSOLUTE COUNT: 0.1 10*9/L (ref 0.0–0.1)
BASOPHILS RELATIVE PERCENT: 1.2 %
EOSINOPHILS ABSOLUTE COUNT: 0.1 10*9/L (ref 0.0–0.5)
EOSINOPHILS RELATIVE PERCENT: 2 %
HEMATOCRIT: 39.6 % (ref 39.0–48.0)
HEMOGLOBIN: 12.5 g/dL — ABNORMAL LOW (ref 12.9–16.5)
LYMPHOCYTES ABSOLUTE COUNT: 1.5 10*9/L (ref 1.1–3.6)
LYMPHOCYTES RELATIVE PERCENT: 20.8 %
MEAN CORPUSCULAR HEMOGLOBIN CONC: 31.6 g/dL — ABNORMAL LOW (ref 32.0–36.0)
MEAN CORPUSCULAR HEMOGLOBIN: 21.6 pg — ABNORMAL LOW (ref 25.9–32.4)
MEAN CORPUSCULAR VOLUME: 68.5 fL — ABNORMAL LOW (ref 77.6–95.7)
MEAN PLATELET VOLUME: 7.4 fL (ref 6.8–10.7)
MONOCYTES ABSOLUTE COUNT: 1 10*9/L — ABNORMAL HIGH (ref 0.3–0.8)
MONOCYTES RELATIVE PERCENT: 13.6 %
NEUTROPHILS ABSOLUTE COUNT: 4.4 10*9/L (ref 1.8–7.8)
NEUTROPHILS RELATIVE PERCENT: 62.4 %
NUCLEATED RED BLOOD CELLS: 0 /100{WBCs} (ref <=4)
PLATELET COUNT: 452 10*9/L — ABNORMAL HIGH (ref 150–450)
RED BLOOD CELL COUNT: 5.78 10*12/L — ABNORMAL HIGH (ref 4.26–5.60)
RED CELL DISTRIBUTION WIDTH: 18.5 % — ABNORMAL HIGH (ref 12.2–15.2)
WBC ADJUSTED: 7 10*9/L (ref 3.6–11.2)

## 2020-07-31 LAB — HIGH SENSITIVITY TROPONIN I - SERIAL: HIGH SENSITIVITY TROPONIN I: <3 ng/L (ref <=53)

## 2020-07-31 LAB — PROTIME-INR
INR: 1.2
PROTIME: 14 s — ABNORMAL HIGH (ref 10.3–13.4)

## 2020-07-31 LAB — HIGH SENSITIVITY TROPONIN I - 2 HOUR SERIAL
HIGH SENSITIVITY TROPONIN - DELTA (0-2H): 0 ng/L (ref <=7)
HIGH-SENSITIVITY TROPONIN I - 2 HOUR: <3 ng/L (ref <=53)

## 2020-07-31 MED ORDER — REPATHA SYRINGE 140 MG/ML SUBCUTANEOUS SYRINGE
SUBCUTANEOUS | 5 refills | 28 days | Status: CP
Start: 2020-07-31 — End: ?
  Filled 2020-08-06: qty 2, 28d supply, fill #0

## 2020-07-31 NOTE — Unmapped (Signed)
[facility] Clinical Assessment & Refill Coordination Note    Ryan Hale, DOB: 01/05/1976  Phone: [phone] (home)     All above HIPAA information was verified with patient.     Was a Nurse, learning disability used for this call? No    Specialty Medication(s):   General Specialty: Repatha     Current Outpatient Medications   Medication Sig Dispense Refill   ??? acetaminophen (TYLENOL) 500 MG tablet Take 500 mg by mouth every four (4) hours as needed.      ??? albuterol HFA 90 mcg/actuation inhaler Inhale 2 puffs every six (6) hours as needed.      ??? aspirin 81 MG chewable tablet Chew 1 tablet (81 mg total) daily. 30 tablet 11   ??? budesonide-formoteroL (SYMBICORT) 160-4.5 mcg/actuation inhaler Inhale 2 puffs. (Patient not taking: Reported on 04/16/2020)     ??? chlorthalidone (HYGROTON) 25 MG tablet TAKE 1 TABLET(25 MG) BY MOUTH DAILY 90 tablet 1   ??? DEXILANT 60 mg capsule daily.  (Patient not taking: Reported on 04/16/2020)     ??? diclofenac sodium (VOLTAREN) 1 % gel Apply 2 g topically two (2) times a day as needed.      ??? evolocumab (REPATHA SYRINGE) 140 mg/mL Syrg Inject the contents of 1 syringe (140 mg) under the skin every fourteen (14) days. 2 mL 5   ??? ezetimibe (ZETIA) 10 mg tablet TAKE 1 TABLET(10 MG) BY MOUTH DAILY 90 tablet 3   ??? fluticasone propionate (FLONASE) 50 mcg/actuation nasal spray 2 sprays into each nostril daily. 16 g 0   ??? HYDROcodone-acetaminophen (NORCO) 5-325 mg per tablet Take 0.5-1 tablets by mouth every six (6) hours as needed.      ??? lidocaine (LIDODERM) 5 % patch Place 1 patch on the skin.     ??? loratadine (CLARITIN) 10 mg tablet Take 10 mg by mouth daily as needed.      ??? nitroglycerin (NITROSTAT) 0.4 MG SL tablet Place 1 tablet (0.4 mg total) under the tongue every five (5) minutes as needed for chest pain. 30 tablet 2   ??? pregabalin (LYRICA) 25 MG capsule Take 25 mg poqam and 50 mg poqpm for chronic pain     ??? sertraline (ZOLOFT) 25 MG tablet Take 25 mg by mouth daily. ??? TiZANidine (ZANAFLEX) 4 MG capsule Take 4 mg by mouth Three (3) times a day as needed.  (Patient not taking: Reported on 12/07/2019)       No current facility-administered medications for this visit.        Changes to medications: Truth reports no changes at this time.    Allergies   Allergen Reactions   ??? Ace Inhibitors Swelling   ??? Lisinopril Swelling   ??? Norvasc [Amlodipine] Palpitations       Changes to allergies: No    SPECIALTY MEDICATION ADHERENCE     Repatha 140 mg/ml: 8 days of medicine on hand       Medication Adherence    Patient reported X missed doses in the last month: 0  Specialty Medication: Repatha 140 mg/mL  Informant: patient          Specialty medication(s) dose(s) confirmed: Regimen is correct and unchanged.     Are there any concerns with adherence? No    Adherence counseling provided? Not needed    CLINICAL MANAGEMENT AND INTERVENTION      Clinical Benefit Assessment:    Do you feel the medicine is effective or helping your condition?  Yes    Clinical Benefit counseling provided? Progress note from 04/16/20 shows evidence of clinical benefit    Adverse Effects Assessment:    Are you experiencing any side effects? No    Are you experiencing difficulty administering your medicine? No    Quality of Life Assessment:    How many days over the past month did your hyperlipidemia  keep you from your normal activities? For example, brushing your teeth or getting up in the morning. Patient declined to answer    Have you discussed this with your provider? Not needed    Acute Infection Status:    Acute infections noted within Epic:  No active infections  Patient reported infection: None    Therapy Appropriateness:    Is therapy appropriate? Yes, therapy is appropriate and should be continued    DISEASE/MEDICATION-SPECIFIC INFORMATION      For patients on injectable medications: Patient currently has 0 doses left.  Next injection is scheduled for 08/06/20.    PATIENT SPECIFIC NEEDS     - Does the patient have any physical, cognitive, or cultural barriers? No    - Is the patient high risk? No    - Does the patient require a Care Management Plan? No     - Does the patient require physician intervention or other additional services (i.e. nutrition, smoking cessation, social work)? No      SHIPPING     Specialty Medication(s) to be Shipped:   General Specialty: Repatha    Other medication(s) to be shipped: No additional medications requested for fill at this time     Changes to insurance: No    Delivery Scheduled: Yes, Expected medication delivery date: 08/06/20.  However, Rx request for refills was sent to the provider as there are none remaining.     Medication will be delivered via Same Day Courier to the confirmed prescription address in [facility].    The patient will receive a drug information handout for each medication shipped and additional FDA Medication Guides as required.  Verified that patient has previously received a Conservation officer, historic buildings and a Surveyor, mining.    All of the patient's questions and concerns have been addressed.    [name]   [facility] Specialty Pharmacist

## 2020-08-01 NOTE — Unmapped (Signed)
Pt woke with chest pain radiating to left shoulder. Pt states pain has been intermittent all day, not ever resolving. Pt endorses shortness of breath and mild nausea.

## 2020-08-01 NOTE — Unmapped (Signed)
Pt says that he has done a HOME COVID on 07/30/20

## 2020-08-06 ENCOUNTER — Ambulatory Visit (INDEPENDENT_AMBULATORY_CARE_PROVIDER_SITE_OTHER): Payer: Self-pay | Admitting: Family Medicine

## 2020-08-06 ENCOUNTER — Encounter: Payer: Self-pay | Admitting: Family Medicine

## 2020-08-06 ENCOUNTER — Other Ambulatory Visit: Payer: Self-pay

## 2020-08-06 VITALS — BP 124/68 | HR 95 | Temp 98.3°F | Resp 18 | Ht 72.0 in | Wt 253.3 lb

## 2020-08-06 DIAGNOSIS — G8929 Other chronic pain: Secondary | ICD-10-CM | POA: Diagnosis not present

## 2020-08-06 DIAGNOSIS — Z79891 Long term (current) use of opiate analgesic: Secondary | ICD-10-CM | POA: Diagnosis not present

## 2020-08-06 DIAGNOSIS — M25552 Pain in left hip: Secondary | ICD-10-CM

## 2020-08-06 DIAGNOSIS — M79604 Pain in right leg: Secondary | ICD-10-CM | POA: Diagnosis not present

## 2020-08-06 DIAGNOSIS — I10 Essential (primary) hypertension: Secondary | ICD-10-CM

## 2020-08-06 DIAGNOSIS — F419 Anxiety disorder, unspecified: Secondary | ICD-10-CM

## 2020-08-06 DIAGNOSIS — F329 Major depressive disorder, single episode, unspecified: Secondary | ICD-10-CM

## 2020-08-06 DIAGNOSIS — M79605 Pain in left leg: Secondary | ICD-10-CM

## 2020-08-06 DIAGNOSIS — R7989 Other specified abnormal findings of blood chemistry: Secondary | ICD-10-CM

## 2020-08-06 DIAGNOSIS — Z5181 Encounter for therapeutic drug level monitoring: Secondary | ICD-10-CM

## 2020-08-06 DIAGNOSIS — T07XXXA Unspecified multiple injuries, initial encounter: Secondary | ICD-10-CM

## 2020-08-06 DIAGNOSIS — R52 Pain, unspecified: Secondary | ICD-10-CM | POA: Diagnosis not present

## 2020-08-06 DIAGNOSIS — K219 Gastro-esophageal reflux disease without esophagitis: Secondary | ICD-10-CM

## 2020-08-06 DIAGNOSIS — Z79899 Other long term (current) drug therapy: Secondary | ICD-10-CM

## 2020-08-06 DIAGNOSIS — E782 Mixed hyperlipidemia: Secondary | ICD-10-CM

## 2020-08-06 DIAGNOSIS — G4700 Insomnia, unspecified: Secondary | ICD-10-CM

## 2020-08-06 DIAGNOSIS — D649 Anemia, unspecified: Secondary | ICD-10-CM

## 2020-08-06 MED ORDER — HYDROCODONE-ACETAMINOPHEN 5-325 MG PO TABS: 0.5000 | ORAL_TABLET | Freq: Two times a day (BID) | ORAL | 0 refills | Status: DC | PRN

## 2020-08-06 MED ORDER — EZETIMIBE 10 MG PO TABS: 10.0000 mg | ORAL_TABLET | Freq: Every day | ORAL | 3 refills | Status: DC

## 2020-08-06 MED ORDER — DULOXETINE HCL 30 MG PO CPEP: 30.0000 mg | ORAL_CAPSULE | Freq: Every day | ORAL | 3 refills | Status: DC

## 2020-08-06 MED ORDER — SERTRALINE HCL 25 MG PO TABS: 25.0000 mg | ORAL_TABLET | Freq: Every day | ORAL | 3 refills | Status: DC

## 2020-08-06 MED ORDER — PREGABALIN 25 MG PO CAPS: 25.0000 mg | ORAL_CAPSULE | Freq: Every day | ORAL | 1 refills | Status: DC
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
## 2020-08-07 ENCOUNTER — Ambulatory Visit: Payer: BLUE CROSS/BLUE SHIELD | Admitting: Family Medicine

## 2020-08-07 LAB — COMPLETE METABOLIC PANEL WITH GFR
AG Ratio: 1 (calc) (ref 1.0–2.5)
ALT: 26 U/L (ref 9–46)
AST: 18 U/L (ref 10–40)
Albumin: 4 g/dL (ref 3.6–5.1)
Alkaline phosphatase (APISO): 124 U/L (ref 36–130)
BUN: 13 mg/dL (ref 7–25)
CO2: 28 mmol/L (ref 20–32)
Calcium: 9.8 mg/dL (ref 8.6–10.3)
Chloride: 101 mmol/L (ref 98–110)
Creat: 1.08 mg/dL (ref 0.60–1.35)
GFR, Est African American: 96 mL/min/{1.73_m2} (ref >=60)
GFR, Est Non African American: 83 mL/min/{1.73_m2} (ref >=60)
Globulin: 4.2 g/dL (calc) — ABNORMAL HIGH (ref 1.9–3.7)
Glucose, Bld: 68 mg/dL (ref 65–99)
Potassium: 3.9 mmol/L (ref 3.5–5.3)
Sodium: 137 mmol/L (ref 135–146)
Total Bilirubin: 0.3 mg/dL (ref 0.2–1.2)
Total Protein: 8.2 g/dL — ABNORMAL HIGH (ref 6.1–8.1)

## 2020-08-07 LAB — CBC WITH DIFFERENTIAL/PLATELET
Absolute Monocytes: 672 cells/uL (ref 200–950)
Basophils Absolute: 44 cells/uL (ref 0–200)
Basophils Relative: 0.6 %
Eosinophils Absolute: 139 cells/uL (ref 15–500)
Eosinophils Relative: 1.9 %
HCT: 44.1 % (ref 38.5–50.0)
Hemoglobin: 13.1 g/dL — ABNORMAL LOW (ref 13.2–17.1)
Lymphs Abs: 1548 cells/uL (ref 850–3900)
MCH: 21.4 pg — ABNORMAL LOW (ref 27.0–33.0)
MCHC: 29.7 g/dL — ABNORMAL LOW (ref 32.0–36.0)
MCV: 71.9 fL — ABNORMAL LOW (ref 80.0–100.0)
MPV: 9.2 fL (ref 7.5–12.5)
Monocytes Relative: 9.2 %
Neutro Abs: 4898 cells/uL (ref 1500–7800)
Neutrophils Relative %: 67.1 %
Platelets: 467 10*3/uL — ABNORMAL HIGH (ref 140–400)
RBC: 6.13 10*6/uL — ABNORMAL HIGH (ref 4.20–5.80)
RDW: 17.6 % — ABNORMAL HIGH (ref 11.0–15.0)
Total Lymphocyte: 21.2 %
WBC: 7.3 10*3/uL (ref 3.8–10.8)

## 2020-08-07 LAB — LIPID PANEL
Cholesterol: 146 mg/dL (ref <200)
HDL: 33 mg/dL — ABNORMAL LOW (ref >=40)
LDL Cholesterol (Calc): 87 mg/dL (calc)
Non-HDL Cholesterol (Calc): 113 mg/dL (calc) (ref <130)
Total CHOL/HDL Ratio: 4.4 (calc) (ref <5.0)
Triglycerides: 159 mg/dL — ABNORMAL HIGH (ref <150)

## 2020-08-19 ENCOUNTER — Other Ambulatory Visit: Payer: Self-pay

## 2020-08-19 ENCOUNTER — Emergency Department
Admission: EM | Admit: 2020-08-19 | Discharge: 2020-08-19 | Disposition: A | Discharge status: Home / Self Care | Payer: BLUE CROSS/BLUE SHIELD | Attending: Emergency Medicine | Admitting: Emergency Medicine

## 2020-08-19 DIAGNOSIS — Z5321 Procedure and treatment not carried out due to patient leaving prior to being seen by health care provider: Secondary | ICD-10-CM | POA: Diagnosis not present

## 2020-08-19 DIAGNOSIS — R0789 Other chest pain: Secondary | ICD-10-CM | POA: Insufficient documentation

## 2020-08-19 NOTE — ED Triage Notes (Signed)
Pt states he was out of his b/p meds on Friday and today he began having chest tightness with feeling anxious broke out in to a sweat and states he got his medication filled and took the medication, states he is feeling better now and was told by his pharmacist to gets his b/p checked after the medication had time to work,.

## 2020-08-27 NOTE — Unmapped (Signed)
[facility] Refill Coordination Note    Specialty Medication(s) to be Shipped:   General Specialty: Repatha    Other medication(s) to be shipped: No additional medications requested for fill at this time     Ryan Hale, DOB: 16-Mar-1976  Phone: [phone] (home)       All above HIPAA information was verified with patient.     Was a Nurse, learning disability used for this call? No    Completed refill call assessment today to schedule patient's medication shipment from the [facility] [phone]).  All relevant notes have been reviewed.     Specialty medication(s) and dose(s) confirmed: Regimen is correct and unchanged.   Changes to medications: [name] reports no changes at this time.  Changes to insurance: No  New side effects reported not previously addressed with a pharmacist or physician: None reported  Questions for the pharmacist: No    Confirmed patient received a Conservation officer, historic buildings and a Surveyor, mining with first shipment. The patient will receive a drug information handout for each medication shipped and additional FDA Medication Guides as required.       DISEASE/MEDICATION-SPECIFIC INFORMATION        For patients on injectable medications: Patient currently has 0 doses left.  Next injection is scheduled for 09/05/20.    SPECIALTY MEDICATION ADHERENCE     Medication Adherence    Patient reported X missed doses in the last month: 0  Specialty Medication: Repatha 140mg /ml  Patient is on additional specialty medications: No  Patient is on more than two specialty medications: No              Were doses missed due to medication being on hold? No    Repatha 140 mg/ml: 0 days of medicine on hand       REFERRAL TO PHARMACIST     Referral to the pharmacist: Not needed      [facility]     Shipping address confirmed in Epic.     Delivery Scheduled: Yes, Expected medication delivery date: 09/03/20.     Medication will be delivered via Same Day Courier to the prescription address in Epic WAM.    Nancy Nordmann [facility] Specialty Technician

## 2020-09-03 DIAGNOSIS — Z789 Other specified health status: Principal | ICD-10-CM

## 2020-09-03 DIAGNOSIS — I2510 Atherosclerotic heart disease of native coronary artery without angina pectoris: Principal | ICD-10-CM

## 2020-09-03 DIAGNOSIS — E785 Hyperlipidemia, unspecified: Principal | ICD-10-CM

## 2020-09-03 MED FILL — REPATHA SYRINGE 140 MG/ML SUBCUTANEOUS SYRINGE: SUBCUTANEOUS | 28 days supply | Qty: 2 | Fill #1

## 2020-09-10 ENCOUNTER — Telehealth: Payer: Self-pay | Admitting: Emergency Medicine

## 2020-09-10 ENCOUNTER — Other Ambulatory Visit: Payer: Self-pay | Admitting: Family Medicine

## 2020-09-10 DIAGNOSIS — Z79899 Other long term (current) drug therapy: Secondary | ICD-10-CM

## 2020-09-10 DIAGNOSIS — R52 Pain, unspecified: Secondary | ICD-10-CM

## 2020-09-10 DIAGNOSIS — Z79891 Long term (current) use of opiate analgesic: Secondary | ICD-10-CM

## 2020-09-10 DIAGNOSIS — G8929 Other chronic pain: Secondary | ICD-10-CM

## 2020-09-10 MED ORDER — HYDROCODONE-ACETAMINOPHEN 5-325 MG PO TABS: 0.5000 | ORAL_TABLET | Freq: Two times a day (BID) | ORAL | 0 refills | Status: DC | PRN

## 2020-09-10 NOTE — Telephone Encounter (Signed)
Thank you so much

## 2020-09-10 NOTE — Telephone Encounter (Signed)
Dr. Ancil Boozer this patient saw Kristeen Miss in May for medication refills. She has not closed his chart or sent refills on Hydrocodone. He has been decreasing his medication to eventually go off. Can you look at this chart when free regarding a refill #35
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
## 2020-09-16 ENCOUNTER — Other Ambulatory Visit: Payer: Self-pay

## 2020-09-16 ENCOUNTER — Inpatient Hospital Stay: Payer: BLUE CROSS/BLUE SHIELD | Attending: Oncology

## 2020-09-16 DIAGNOSIS — I2510 Atherosclerotic heart disease of native coronary artery without angina pectoris: Secondary | ICD-10-CM | POA: Insufficient documentation

## 2020-09-16 DIAGNOSIS — I252 Old myocardial infarction: Secondary | ICD-10-CM | POA: Insufficient documentation

## 2020-09-16 DIAGNOSIS — I509 Heart failure, unspecified: Secondary | ICD-10-CM | POA: Insufficient documentation

## 2020-09-16 DIAGNOSIS — R768 Other specified abnormal immunological findings in serum: Secondary | ICD-10-CM | POA: Insufficient documentation

## 2020-09-16 DIAGNOSIS — R718 Other abnormality of red blood cells: Secondary | ICD-10-CM

## 2020-09-16 DIAGNOSIS — Z955 Presence of coronary angioplasty implant and graft: Secondary | ICD-10-CM | POA: Insufficient documentation

## 2020-09-16 DIAGNOSIS — Z7902 Long term (current) use of antithrombotics/antiplatelets: Secondary | ICD-10-CM | POA: Insufficient documentation

## 2020-09-16 DIAGNOSIS — Z79899 Other long term (current) drug therapy: Secondary | ICD-10-CM | POA: Insufficient documentation

## 2020-09-16 DIAGNOSIS — K219 Gastro-esophageal reflux disease without esophagitis: Secondary | ICD-10-CM | POA: Insufficient documentation

## 2020-09-16 DIAGNOSIS — I4891 Unspecified atrial fibrillation: Secondary | ICD-10-CM | POA: Insufficient documentation

## 2020-09-16 DIAGNOSIS — J45909 Unspecified asthma, uncomplicated: Secondary | ICD-10-CM | POA: Insufficient documentation

## 2020-09-16 DIAGNOSIS — I110 Hypertensive heart disease with heart failure: Secondary | ICD-10-CM | POA: Insufficient documentation

## 2020-09-16 DIAGNOSIS — E785 Hyperlipidemia, unspecified: Secondary | ICD-10-CM | POA: Insufficient documentation

## 2020-09-16 LAB — CBC WITH DIFFERENTIAL/PLATELET
Abs Immature Granulocytes: 0.02 10*3/uL (ref 0.00–0.07)
Basophils Absolute: 0.1 10*3/uL (ref 0.0–0.1)
Basophils Relative: 1 %
Eosinophils Absolute: 0.1 10*3/uL (ref 0.0–0.5)
Eosinophils Relative: 2 %
HCT: 40.6 % (ref 39.0–52.0)
Hemoglobin: 12.5 g/dL — ABNORMAL LOW (ref 13.0–17.0)
Immature Granulocytes: 0 %
Lymphocytes Relative: 30 %
Lymphs Abs: 1.8 10*3/uL (ref 0.7–4.0)
MCH: 22.2 pg — ABNORMAL LOW (ref 26.0–34.0)
MCHC: 30.8 g/dL (ref 30.0–36.0)
MCV: 72.1 fL — ABNORMAL LOW (ref 80.0–100.0)
Monocytes Absolute: 0.6 10*3/uL (ref 0.1–1.0)
Monocytes Relative: 10 %
Neutro Abs: 3.5 10*3/uL (ref 1.7–7.7)
Neutrophils Relative %: 57 %
Platelets: 459 10*3/uL — ABNORMAL HIGH (ref 150–400)
RBC: 5.63 MIL/uL (ref 4.22–5.81)
RDW: 18.6 % — ABNORMAL HIGH (ref 11.5–15.5)
WBC: 6.1 10*3/uL (ref 4.0–10.5)
nRBC: 0 % (ref 0.0–0.2)

## 2020-09-16 LAB — IRON AND TIBC
Iron: 46 ug/dL (ref 45–182)
Saturation Ratios: 12 % — ABNORMAL LOW (ref 17.9–39.5)
TIBC: 400 ug/dL (ref 250–450)
UIBC: 354 ug/dL

## 2020-09-16 LAB — FERRITIN: Ferritin: 34 ng/mL (ref 24–336)

## 2020-09-17 LAB — IGG, IGA, IGM
IgA: 414 mg/dL — ABNORMAL HIGH (ref 90–386)
IgG (Immunoglobin G), Serum: 2466 mg/dL — ABNORMAL HIGH (ref 603–1613)
IgM (Immunoglobulin M), Srm: 156 mg/dL (ref 20–172)

## 2020-09-17 LAB — KAPPA/LAMBDA LIGHT CHAINS
Kappa free light chain: 72.8 mg/L — ABNORMAL HIGH (ref 3.3–19.4)
Kappa, lambda light chain ratio: 2.31 — ABNORMAL HIGH (ref 0.26–1.65)
Lambda free light chains: 31.5 mg/L — ABNORMAL HIGH (ref 5.7–26.3)

## 2020-09-18 ENCOUNTER — Encounter: Payer: Self-pay | Admitting: Oncology

## 2020-09-18 ENCOUNTER — Other Ambulatory Visit: Payer: Self-pay | Admitting: Oncology

## 2020-09-23 ENCOUNTER — Encounter: Payer: Self-pay | Admitting: Family Medicine

## 2020-09-23 MED ORDER — HYDROCODONE-ACETAMINOPHEN 5-325 MG PO TABS: 0.5000 | ORAL_TABLET | Freq: Two times a day (BID) | ORAL | 0 refills | Status: DC | PRN

## 2020-09-24 ENCOUNTER — Inpatient Hospital Stay: Payer: BLUE CROSS/BLUE SHIELD

## 2020-09-24 ENCOUNTER — Inpatient Hospital Stay (HOSPITAL_BASED_OUTPATIENT_CLINIC_OR_DEPARTMENT_OTHER): Payer: BLUE CROSS/BLUE SHIELD | Admitting: Oncology

## 2020-09-24 ENCOUNTER — Encounter: Payer: Self-pay | Admitting: Oncology

## 2020-09-24 VITALS — BP 133/94 | HR 75 | Temp 97.2°F | Resp 20 | Wt 246.9 lb

## 2020-09-24 DIAGNOSIS — R718 Other abnormality of red blood cells: Secondary | ICD-10-CM | POA: Diagnosis not present

## 2020-09-24 DIAGNOSIS — R778 Other specified abnormalities of plasma proteins: Secondary | ICD-10-CM

## 2020-09-24 DIAGNOSIS — D509 Iron deficiency anemia, unspecified: Secondary | ICD-10-CM

## 2020-09-24 MED ORDER — IRON SUCROSE 20 MG/ML IV SOLN
200.0000 mg | Freq: Once | INTRAVENOUS | Status: AC
  Administered 2020-09-24: 200 mg via INTRAVENOUS
  Filled 2020-09-24: qty 10

## 2020-09-24 MED ORDER — SODIUM CHLORIDE 0.9 % IV SOLN: 200.0000 mg | Freq: Once | INTRAVENOUS | Status: DC

## 2020-09-24 MED ORDER — SODIUM CHLORIDE 0.9 % IV SOLN
Freq: Once | INTRAVENOUS | Status: AC
  Filled 2020-09-24: qty 250

## 2020-09-24 NOTE — Patient Instructions (Signed)
[facility] [place] [facility]  Discharge Instructions: Thank you for choosing [facility] to provide your oncology and hematology care.  If you have a lab appointment with the Cancer Center, please go directly to the Cancer Center and check in at the registration area.  Wear comfortable clothing and clothing appropriate for easy access to any Portacath or PICC line.   We strive to give you quality time with your provider. You may need to reschedule your appointment if you arrive late (15 or more minutes).  Arriving late affects you and other patients whose appointments are after yours.  Also, if you miss three or more appointments without notifying the office, you may be dismissed from the clinic at the provider's discretion.      For prescription refill requests, have your pharmacy contact our office and allow 72 hours for refills to be completed.    Today you received the following : Venofer   To help prevent nausea and vomiting after your treatment, we encourage you to take your nausea medication as directed.  BELOW ARE SYMPTOMS THAT SHOULD BE REPORTED IMMEDIATELY: . *FEVER GREATER THAN 100.4 F (38 C) OR HIGHER . *CHILLS OR SWEATING . *NAUSEA AND VOMITING THAT IS NOT CONTROLLED WITH YOUR NAUSEA MEDICATION . *UNUSUAL SHORTNESS OF BREATH . *UNUSUAL BRUISING OR BLEEDING . *URINARY PROBLEMS (pain or burning when urinating, or frequent urination) . *BOWEL PROBLEMS (unusual diarrhea, constipation, pain near the anus) . TENDERNESS IN MOUTH AND THROAT WITH OR WITHOUT PRESENCE OF ULCERS (sore throat, sores in mouth, or a toothache) . UNUSUAL RASH, SWELLING OR PAIN  . UNUSUAL VAGINAL DISCHARGE OR ITCHING   Items with * indicate a potential emergency and should be followed up as soon as possible or go to the Emergency Department if any problems should occur.  Please show the CHEMOTHERAPY ALERT CARD or IMMUNOTHERAPY ALERT CARD at check-in to the Emergency  Department and triage nurse.  Should you have questions after your visit or need to cancel or reschedule your appointment, please contact [facility] [place] [facility]  [phone] and follow the prompts.  Office hours are 8:00 a.m. to 4:30 p.m. Monday - Friday. Please note that voicemails left after 4:00 p.m. may not be returned until the following business day.  We are closed weekends and major holidays. You have access to a nurse at all times for urgent questions. Please call the main number to the clinic [phone] and follow the prompts.  For any non-urgent questions, you may also contact your provider using MyChart. We now offer e-Visits for anyone 18 and older to request care online for non-urgent symptoms. For details visit [email or web address].   Also download the MyChart app! Go to the app store, search "MyChart", open the app, select [facility], and log in with your MyChart username and password.  Due to Covid, a mask is required upon entering the hospital/clinic. If you do not have a mask, one will be given to you upon arrival. For doctor visits, patients may have 1 support person aged 18 or older with them. For treatment visits, patients cannot have anyone with them due to current Covid guidelines and our immunocompromised population.  

## 2020-09-24 NOTE — Unmapped (Signed)
[facility] Refill Coordination Note    Specialty Medication(s) to be Shipped:   General Specialty: Repatha    Other medication(s) to be shipped: No additional medications requested for fill at this time     Ryan Hale, DOB: Feb 28, 1976  Phone: [phone] (home)       All above HIPAA information was verified with patient.     Was a Nurse, learning disability used for this call? No    Completed refill call assessment today to schedule patient's medication shipment from the [facility] [phone]).  All relevant notes have been reviewed.     Specialty medication(s) and dose(s) confirmed: Regimen is correct and unchanged.   Changes to medications: Ryan Hale reports no changes at this time.  Changes to insurance: No  New side effects reported not previously addressed with a pharmacist or physician: None reported  Questions for the pharmacist: No    Confirmed patient received a Conservation officer, historic buildings and a Surveyor, mining with first shipment. The patient will receive a drug information handout for each medication shipped and additional FDA Medication Guides as required.       DISEASE/MEDICATION-SPECIFIC INFORMATION        For patients on injectable medications: Patient currently has 0 doses left.  Next injection is scheduled for 10/03/20.    SPECIALTY MEDICATION ADHERENCE     Medication Adherence    Patient reported X missed doses in the last month: 0  Specialty Medication: Repatha 140mg /ml  Patient is on additional specialty medications: No  Patient is on more than two specialty medications: No              Were doses missed due to medication being on hold? No    Repatha 140 mg/ml: 0 days of medicine on hand       REFERRAL TO PHARMACIST     Referral to the pharmacist: Not needed      [facility]     Shipping address confirmed in Epic.     Delivery Scheduled: Yes, Expected medication delivery date: 09/30/20.     Medication will be delivered via Same Day Courier to the prescription address in Epic WAM.    Nancy Nordmann [facility] Specialty Technician

## 2020-09-24 NOTE — Progress Notes (Signed)
Pt made aware that visit/ infusion is out of network . Pt spoke with Corene Cornea . Pt would like to proceed with infusion though we are out of network. Gave pt billing phone number to call about cost.
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
## 2020-09-24 NOTE — Progress Notes (Signed)
Patient denies any concerns today.  

## 2020-09-25 ENCOUNTER — Encounter: Payer: Self-pay | Admitting: Oncology

## 2020-09-30 MED FILL — REPATHA SYRINGE 140 MG/ML SUBCUTANEOUS SYRINGE: SUBCUTANEOUS | 28 days supply | Qty: 2 | Fill #2

## 2020-10-10 ENCOUNTER — Telehealth: Payer: Self-pay

## 2020-10-10 NOTE — Telephone Encounter (Signed)
error 

## 2020-10-15 ENCOUNTER — Encounter: Payer: Self-pay | Admitting: Oncology

## 2020-10-18 NOTE — Unmapped (Signed)
[facility] Refill Coordination Note    Specialty Medication(s) to be Shipped:   General Specialty: Repatha    Other medication(s) to be shipped: No additional medications requested for fill at this time     Ryan Hale, DOB: May 20, 1975  Phone: [phone] (home)       All above HIPAA information was verified with patient.     Was a Nurse, learning disability used for this call? No    Completed refill call assessment today to schedule patient's medication shipment from the [facility] [phone]).  All relevant notes have been reviewed.     Specialty medication(s) and dose(s) confirmed: Regimen is correct and unchanged.   Changes to medications: [place] reports no changes at this time.  Changes to insurance: No  New side effects reported not previously addressed with a pharmacist or physician: None reported  Questions for the pharmacist: No    Confirmed patient received a Conservation officer, historic buildings and a Surveyor, mining with first shipment. The patient will receive a drug information handout for each medication shipped and additional FDA Medication Guides as required.       DISEASE/MEDICATION-SPECIFIC INFORMATION        For patients on injectable medications: Patient currently has 0 doses left.  Next injection is scheduled for 10/31/20.    SPECIALTY MEDICATION ADHERENCE     Medication Adherence    Patient reported X missed doses in the last month: 0  Specialty Medication: Repatha 140mg /ml  Patient is on additional specialty medications: No  Patient is on more than two specialty medications: No              Were doses missed due to medication being on hold? No    Repatha 140 mg/ml: 0 days of medicine on hand       REFERRAL TO PHARMACIST     Referral to the pharmacist: Not needed      [facility]     Shipping address confirmed in Epic.     Delivery Scheduled: Yes, Expected medication delivery date: 10/28/20.     Medication will be delivered via Same Day Courier to the prescription address in Epic WAM.    Nancy Nordmann [facility] Specialty Technician

## 2020-10-28 MED FILL — REPATHA SYRINGE 140 MG/ML SUBCUTANEOUS SYRINGE: SUBCUTANEOUS | 28 days supply | Qty: 2 | Fill #3

## 2020-11-06 ENCOUNTER — Encounter: Payer: Self-pay | Admitting: Physical Medicine & Rehabilitation

## 2020-11-07 ENCOUNTER — Encounter: Payer: Self-pay | Admitting: Family Medicine

## 2020-11-07 ENCOUNTER — Ambulatory Visit: Payer: BLUE CROSS/BLUE SHIELD | Admitting: Family Medicine

## 2020-11-07 ENCOUNTER — Other Ambulatory Visit: Payer: Self-pay

## 2020-11-07 VITALS — BP 126/78 | HR 89 | Temp 97.7°F | Resp 16 | Ht 71.0 in | Wt 247.8 lb

## 2020-11-07 DIAGNOSIS — E782 Mixed hyperlipidemia: Secondary | ICD-10-CM

## 2020-11-07 DIAGNOSIS — Z79891 Long term (current) use of opiate analgesic: Secondary | ICD-10-CM

## 2020-11-07 DIAGNOSIS — M79604 Pain in right leg: Secondary | ICD-10-CM

## 2020-11-07 DIAGNOSIS — G8929 Other chronic pain: Secondary | ICD-10-CM

## 2020-11-07 DIAGNOSIS — R52 Pain, unspecified: Secondary | ICD-10-CM

## 2020-11-07 DIAGNOSIS — F419 Anxiety disorder, unspecified: Secondary | ICD-10-CM

## 2020-11-07 DIAGNOSIS — F329 Major depressive disorder, single episode, unspecified: Secondary | ICD-10-CM

## 2020-11-07 DIAGNOSIS — Z79899 Other long term (current) drug therapy: Secondary | ICD-10-CM

## 2020-11-07 DIAGNOSIS — M79605 Pain in left leg: Secondary | ICD-10-CM
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
## 2020-11-09 ENCOUNTER — Emergency Department
Admit: 2020-11-09 | Discharge: 2020-11-10 | Disposition: A | Discharge status: Home / Self Care | Payer: BLUE CROSS/BLUE SHIELD | Attending: Emergency Medicine

## 2020-11-09 ENCOUNTER — Ambulatory Visit
Admit: 2020-11-09 | Discharge: 2020-11-10 | Disposition: A | Discharge status: Home / Self Care | Payer: BLUE CROSS/BLUE SHIELD | Attending: Emergency Medicine

## 2020-11-09 LAB — MAGNESIUM

## 2020-11-09 LAB — CBC W/ AUTO DIFF
BASOPHILS ABSOLUTE COUNT: 0.1 10*9/L (ref 0.0–0.1)
BASOPHILS RELATIVE PERCENT: 1 %
EOSINOPHILS ABSOLUTE COUNT: 0.1 10*9/L (ref 0.0–0.5)
EOSINOPHILS RELATIVE PERCENT: 1.6 %
HEMATOCRIT: 38.4 % — ABNORMAL LOW (ref 39.0–48.0)
HEMOGLOBIN: 12.4 g/dL — ABNORMAL LOW (ref 12.9–16.5)
LYMPHOCYTES ABSOLUTE COUNT: 1.9 10*9/L (ref 1.1–3.6)
LYMPHOCYTES RELATIVE PERCENT: 29.8 %
MEAN CORPUSCULAR HEMOGLOBIN CONC: 32.2 g/dL (ref 32.0–36.0)
MEAN CORPUSCULAR HEMOGLOBIN: 23.2 pg — ABNORMAL LOW (ref 25.9–32.4)
MEAN CORPUSCULAR VOLUME: 71.8 fL — ABNORMAL LOW (ref 77.6–95.7)
MEAN PLATELET VOLUME: 7.2 fL (ref 6.8–10.7)
MONOCYTES ABSOLUTE COUNT: 0.5 10*9/L (ref 0.3–0.8)
MONOCYTES RELATIVE PERCENT: 8 %
NEUTROPHILS ABSOLUTE COUNT: 3.8 10*9/L (ref 1.8–7.8)
NEUTROPHILS RELATIVE PERCENT: 59.6 %
NUCLEATED RED BLOOD CELLS: 0 /100{WBCs} (ref <=4)
PLATELET COUNT: 402 10*9/L (ref 150–450)
RED BLOOD CELL COUNT: 5.34 10*12/L (ref 4.26–5.60)
RED CELL DISTRIBUTION WIDTH: 18.8 % — ABNORMAL HIGH (ref 12.2–15.2)
WBC ADJUSTED: 6.4 10*9/L (ref 3.6–11.2)

## 2020-11-09 LAB — URINALYSIS WITH CULTURE REFLEX
BILIRUBIN UA: NEGATIVE
GLUCOSE UA: NEGATIVE
KETONES UA: NEGATIVE
LEUKOCYTE ESTERASE UA: NEGATIVE
NITRITE UA: NEGATIVE
PH UA: 7 (ref 5.0–9.0)
PROTEIN UA: 30 — AB
RBC UA: 5 /HPF — ABNORMAL HIGH (ref <3)
SPECIFIC GRAVITY UA: 1.015 (ref 1.005–1.040)
SQUAMOUS EPITHELIAL: 1 /HPF (ref 0–5)
UROBILINOGEN UA: 0.2
WBC UA: 1 /HPF (ref <2)

## 2020-11-09 LAB — HIGH SENSITIVITY TROPONIN I - 2H/6H SERIAL

## 2020-11-09 LAB — BASIC METABOLIC PANEL
ANION GAP: 8 mmol/L (ref 5–14)
BLOOD UREA NITROGEN: 10 mg/dL (ref 9–23)
BUN / CREAT RATIO: 8
CALCIUM: 8.9 mg/dL (ref 8.7–10.4)
CHLORIDE: 100 mmol/L (ref 98–107)
CO2: 28.5 mmol/L (ref 20.0–31.0)
CREATININE: 1.19 mg/dL — ABNORMAL HIGH
EGFR CKD-EPI (2021) MALE: 77 mL/min/{1.73_m2} (ref >=60)
GLUCOSE RANDOM: 133 mg/dL (ref 70–179)
POTASSIUM: 3.2 mmol/L — ABNORMAL LOW (ref 3.4–4.8)
SODIUM: 136 mmol/L (ref 135–145)

## 2020-11-09 LAB — HIGH SENSITIVITY TROPONIN I - SINGLE: HIGH SENSITIVITY TROPONIN I: <3 ng/L (ref <=53)

## 2020-11-10 LAB — HIGH SENSITIVITY TROPONIN I - 4 HOUR SERIAL: HIGH-SENSITIVITY TROPONIN I - 6 HOUR: 3 ng/L (ref <=53)

## 2020-11-10 MED ADMIN — potassium chloride (KLOR-CON) packet 40 mEq: 40 meq | ORAL | @ 03:00:00 | Stop: 2020-11-09

## 2020-11-10 MED ADMIN — iohexoL (OMNIPAQUE) 350 mg iodine/mL solution 75 mL: 75 mL | INTRAVENOUS | @ 02:00:00 | Stop: 2020-11-09

## 2020-11-10 MED ADMIN — sodium chloride 0.9% (NS) bolus 500 mL: 500 mL | INTRAVENOUS | @ 01:00:00 | Stop: 2020-11-09
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
## 2020-11-10 NOTE — Unmapped (Signed)
Started having chest pressure 1 hours ago, sob, chest tight.

## 2020-11-14 MED ORDER — HYDROCODONE-ACETAMINOPHEN 5-325 MG PO TABS: 0.5000 | ORAL_TABLET | Freq: Two times a day (BID) | ORAL | 0 refills | Status: DC | PRN

## 2020-11-14 MED ORDER — HYDROCODONE-ACETAMINOPHEN 5-325 MG PO TABS: 1.0000 | ORAL_TABLET | Freq: Two times a day (BID) | ORAL | 0 refills | Status: DC | PRN

## 2020-11-15 ENCOUNTER — Telehealth: Payer: Self-pay

## 2020-11-15 MED ORDER — HYDROCODONE-ACETAMINOPHEN 5-325 MG PO TABS: 0.5000 | ORAL_TABLET | Freq: Two times a day (BID) | ORAL | 0 refills | Status: DC | PRN

## 2020-11-15 NOTE — Addendum Note (Signed)
Addended by: Delsa Grana on: 11/15/2020 03:50 PM   Modules accepted: Orders

## 2020-11-15 NOTE — Telephone Encounter (Signed)
Pt would like a call back about his pain meds. He has questions about the amount given. Pharmacy has called him and said that have been trying to reach our office

## 2020-11-15 NOTE — Telephone Encounter (Signed)
Patient stated that the amount given on script was for #45 but in description it says not exceed #35 pills in a month. The pharmacy need scripts sent again to be refill. Pharmacy closed the weekend

## 2020-11-18 NOTE — Telephone Encounter (Signed)
Patient notified

## 2020-11-19 NOTE — Unmapped (Signed)
[facility] Refill Coordination Note    Specialty Medication(s) to be Shipped:   General Specialty: Repatha    Other medication(s) to be shipped: No additional medications requested for fill at this time     Ryan Hale, DOB: Sep 20, 1975  Phone: [phone] (home)       All above HIPAA information was verified with patient.     Was a Nurse, learning disability used for this call? No    Completed refill call assessment today to schedule patient's medication shipment from the [facility] [phone]).  All relevant notes have been reviewed.     Specialty medication(s) and dose(s) confirmed: Regimen is correct and unchanged.   Changes to medications: Ryan Hale reports no changes at this time.  Changes to insurance: No  New side effects reported not previously addressed with a pharmacist or physician: None reported  Questions for the pharmacist: No    Confirmed patient received a Conservation officer, historic buildings and a Surveyor, mining with first shipment. The patient will receive a drug information handout for each medication shipped and additional FDA Medication Guides as required.       DISEASE/MEDICATION-SPECIFIC INFORMATION        For patients on injectable medications: Patient currently has 0 doses left.  Next injection is scheduled for 11/28/20.    SPECIALTY MEDICATION ADHERENCE     Medication Adherence    Patient reported X missed doses in the last month: 0  Specialty Medication: Repatha 140mg /ml  Patient is on additional specialty medications: No  Patient is on more than two specialty medications: No              Were doses missed due to medication being on hold? No    Repatha 140 mg/ml: 0 days of medicine on hand        REFERRAL TO PHARMACIST     Referral to the pharmacist: Not needed      [facility]     Shipping address confirmed in Epic.     Delivery Scheduled: Yes, Expected medication delivery date: 11/26/20.     Medication will be delivered via Same Day Courier to the prescription address in Epic WAM.    [facility] Specialty Technician

## 2020-11-20 NOTE — Telephone Encounter (Signed)
Pt states that the pharmacy does not have the fixed RX. Pt states he's in a lot of pain and would like a call back.

## 2020-11-26 DIAGNOSIS — I2510 Atherosclerotic heart disease of native coronary artery without angina pectoris: Principal | ICD-10-CM

## 2020-11-26 DIAGNOSIS — E785 Hyperlipidemia, unspecified: Principal | ICD-10-CM

## 2020-11-26 NOTE — Unmapped (Signed)
Ryan Hale 's Repatha shipment will be delayed as a result of MAPs seeking manufacture assistance.     I have reached out to the patient  at [phone] and left a voicemail message.  We will call the patient back to reschedule the delivery upon resolution. We have not confirmed the new delivery date.

## 2020-12-10 NOTE — Unmapped (Signed)
Ryan Hale 's Repatha shipment will be canceled  as a result of MAPs seeking manufacture assistance.     I have reached out to the patient  at [phone] and communicated the delay. We will call the patient back to reschedule the delivery upon resolution. We have canceled this work request.

## 2020-12-23 MED ORDER — TADALAFIL 5 MG TABLET
ORAL_TABLET | Freq: Once | ORAL | 1 refills | 0 days | Status: CP | PRN
Start: 2020-12-23 — End: ?

## 2021-01-16 ENCOUNTER — Ambulatory Visit: Admit: 2021-01-16 | Discharge: 2021-01-17

## 2021-01-16 NOTE — Unmapped (Signed)
Pt in for follow up . Has been walking twice weekly and has not had CP with exercise

## 2021-01-16 NOTE — Unmapped (Signed)
DIVISION OF CARDIOLOGY  [facility], [place]  Date of Service: 01/16/2021  Assessment/Plan   1. Coronary artery disease involving native coronary artery of native heart without angina pectoris  Patient is s/p PCI to LAD & RPL (12/2017)??in setting of unstable angina.??He has known??CTO of LCx that would be a high risk intervention. Long hx of atypical CP/reflux w/ multiple ED visits. Normal nuclear stress 08/2018. Doing well, exercising w/o CP.  Last ED visit for angina was 2 months ago. Completely normal work up. He is tolerating exercise well without occurrence of symptoms. Patient exercising 2 times per week by walking and plans to increase. Will continue to monitor.    2. Hyperlipidemia, unspecified hyperlipidemia type  Hx of statin intolerance/myalgias. Now on zetia & Repatha. Last dose of repatha was last night. He has received financial help paying for his future doses. LDL of 87 at [facility] in 08/2020. No changes today.    3. Anxiety  Patient reports that his anxiety has improved since our discussion via MyChart.  He is feeling more at ease with his recent episodes of discomfort and feels that is now well controlled again.      Return to clinic:  Return in about 6 months (around 07/17/2021).    I personally spent 31 minutes face-to-face and non-face-to-face in the care of this patient, which includes all pre, intra, and post visit time on the date of service.     Subjective:   [id number]  Chief complaint:  45 y.o. male with a history of CAD, statin intolerance and recurring atypical chest pain with multiple ED visits who presents for follow up.     History of Present Illness:  Patient is s/p PCI to LAD & RPL (12/2017)??in setting of unstable angina.??He has known??CTO of LCx that would be a high risk intervention. Long hx of atypical CP w/ multiple ED visits. Normal nuclear stress 08/2018.    Patient had ED visit on 04/10/20 for evaluation of left sided chest pain that occurred about an hour after eating dinner. Labs from triage were reassuring, hsTroponin negative, and basic labs comparable to baseline. Discharged on omeprazole 20 mg. Patient was last seen in clinic by Phineas Inches, NP on 04/16/20 and reported some episodes of chest discomfort which he was given reflux medication for by GI. Episodes relieved since he stopped hydrocodone. Patient seen in the ED on 11/09/20 for chest tightness with radiation to his back and left shoulder and associated shortness of breath. EKG, CXR, troponin, and labs were normal. Most recent LDL of 87.    Patient presents today for follow up. He has been walking twice per week for exercise without recurrence of symptoms. He walked an hour yesterday without chest pain, discomfort, or shortness of breath. Patient states his recent episode of left sided chest pain at the ED was similar to the episode just prior to his stent placement. He was laying down in bed when the episode occurred. He got out of bed and moved around with some alleviation, however when he lied down again his chest pain returned. No shortness of breath. His last repatha shot was yesterday. He had a company help pay for a years supply of his medication. 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 He has already received flu shot this year.     Patient Active Problem List   Diagnosis   ??? Chest pain   ??? HTN (hypertension)   ??? Anxiety   ??? HLD (hyperlipidemia)   ???  GERD (gastroesophageal reflux disease)   ??? Coronary artery disease involving native coronary artery of native heart without angina pectoris   ??? Adjustment disorder with mixed anxiety and depressed mood   ??? OSA (obstructive sleep apnea)   ??? Near syncope   ??? Statin intolerance   ??? COVID-19 virus infection       Medications:  Current Outpatient Medications   Medication Sig Dispense Refill   ??? acetaminophen (TYLENOL) 500 MG tablet Take 500 mg by mouth every four (4) hours as needed.      ??? albuterol HFA 90 mcg/actuation inhaler Inhale 2 puffs every six (6) hours as needed.      ??? aspirin 81 MG chewable tablet Chew 1 tablet (81 mg total) daily. 30 tablet 11   ??? chlorthalidone (HYGROTON) 25 MG tablet TAKE 1 TABLET(25 MG) BY MOUTH DAILY 90 tablet 1   ??? diclofenac sodium (VOLTAREN) 1 % gel Apply 2 g topically two (2) times a day as needed.      ??? evolocumab (REPATHA SYRINGE) 140 mg/mL Syrg Inject the contents of 1 syringe (140 mg) under the skin every fourteen (14) days. 2 mL 5   ??? ezetimibe (ZETIA) 10 mg tablet TAKE 1 TABLET(10 MG) BY MOUTH DAILY 90 tablet 3   ??? fluticasone propionate (FLONASE) 50 mcg/actuation nasal spray 2 sprays into each nostril daily. 16 g 0   ??? loratadine (CLARITIN) 10 mg tablet Take 10 mg by mouth daily as needed.      ??? nitroglycerin (NITROSTAT) 0.4 MG SL tablet Place 1 tablet (0.4 mg total) under the tongue every five (5) minutes as needed for chest pain. 30 tablet 2   ??? sertraline (ZOLOFT) 25 MG tablet Take 25 mg by mouth daily.     ??? tadalafiL (CIALIS) 5 MG tablet Take 1 tablet (5 mg total) by mouth once as needed for erectile dysfunction for up to 1 dose. 30 tablet 1   ??? budesonide-formoteroL (SYMBICORT) 160-4.5 mcg/actuation inhaler Inhale 2 puffs. (Patient not taking: No sig reported)     ??? DEXILANT 60 mg capsule daily.  (Patient not taking: No sig reported)     ??? HYDROcodone-acetaminophen (NORCO) 5-325 mg per tablet Take 0.5-1 tablets by mouth every six (6) hours as needed.  (Patient not taking: Reported on 01/16/2021)     ??? pregabalin (LYRICA) 25 MG capsule Take 25 mg poqam and 50 mg poqpm for chronic pain (Patient not taking: Reported on 01/16/2021)     ??? TiZANidine (ZANAFLEX) 4 MG capsule Take 4 mg by mouth Three (3) times a day as needed.  (Patient not taking: No sig reported)       No current facility-administered medications for this visit.       Allergies  Allergies   Allergen Reactions   ??? Ace Inhibitors Swelling   ??? Lisinopril Swelling   ??? Norvasc [Amlodipine] Palpitations       Social History:   Social History Tobacco Use   ??? Smoking status: Former Smoker     Packs/day: 0.75     Years: 9.00     Pack years: 6.75     Types: Cigarettes   ??? Smokeless tobacco: Never Used   Vaping Use   ??? Vaping Use: Never used   Substance Use Topics   ??? Alcohol use: No   ??? Drug use: No       Family History:  Family History   Problem Relation Age of Onset   ??? Heart attack Father    ???  Liver disease Mother        ROS- 12 system review is negative other than what is specified in the History of Present Illness.      Objective:   Physical Exam  Vitals:    01/16/21 1612   BP: 138/83   BP Site: L Arm   BP Position: Sitting   BP Cuff Size: Large   Pulse: 94   SpO2: 96%   Weight: (!) 111.7 kg (246 lb 4.8 oz)     General-  Normal appearing male in no apparent distress.  Neurologic- Alert and oriented X3.  Cranial nerve II-XII grossly intact.  HEENT-  Normocephalic atraumatic head.  No scleral icterus.  Wearing face mask.  Neck- Supple, no carotid bruis, neck veins flat.  Lungs- Clear to auscultation, no wheezes, rhonchi, or rhales.  Heart-  RRR, no MRG.  Abdomen- Soft, nontender, no organomegally.  Extremities-  No clubbing or cyanosis.  No lower extremity edema bilaterally.    Pulses- 2+ pulses in radial and dorsalis pedis bilaterally.  Psych- Normal mood, appropriate.    Laboratory data:      Component Value Date/Time    PROBNP 28.5 11/21/2018 2029       Lab Results   Component Value Date    WBC 6.4 11/09/2020    HGB 12.4 (L) 11/09/2020    HCT 38.4 (L) 11/09/2020    PLT 402 11/09/2020       Lab Results   Component Value Date    NA 136 11/09/2020    K 3.2 (L) 11/09/2020    CL 100 11/09/2020    CO2 28.5 11/09/2020    BUN 10 11/09/2020    CREATININE 1.19 (H) 11/09/2020    GLU 133 11/09/2020    CALCIUM 8.9 11/09/2020    MG  11/09/2020      Comment:      Hemolyzed- Credit         Lab Results   Component Value Date    TSH 2.455 11/30/2018    ALBUMIN 3.5 07/31/2020    ALT 52 (H) 07/31/2020    AST 38 (H) 07/31/2020    INR 1.20 07/31/2020 Electrocardiogram:  From 04/2020 showed sinus tachycardia, 112 bpm, otherwise normal ECG.    From 05/30/20 showed NSR.    From 07/31/20 showed NSR, incomplete RBBB.    From 11/09/20 showed NSR.     Echocardiogram:  From 12/2017 at [facility] showed mild LVH, normal systolic function, EF 55-60%. Mild MR.    Repeat from 03/2018 showed normal LV function with EF>55%. Showed mild LVH.     Nuclear stress test:  From 05/2017 at [facility] was a normal myocardial perfusion scan with no evidence of stress-induced myocardial ischemia. EF 59%.    From 08/2018 was a normal myocardial perfusion study. Showed no evidence of significant ischemia or scar. Post stress showed EF >65%.     Cardiac catheterization:  From 12/2017 showed mild lateral wall hypokinesis with preserved LV systolic function. Coronary artery disease including 50% mid-RCA stenosis, 99% rPL stenosis, occluded proximal circumflex, 70% mid-PDA stenosis and 90% distal LAD stenosis. Successful PCI of??distal??LAD??with placement of an Onyx drug eluting stent    From 12/2017 showed patient stent in mid/distal LAD, LCx is occluded proximally just distal to the OM1 takeoff, OM1 has mild diffuse disease up to 30% in the mid portion, diffuse disease of the RCA with 50% mid stenosis, 99% rPL stenosis, and 60% mid-PDA stenosis. Successful PCI to RPL.    Zio  patch:  From 09/2019 showed patient had a min HR of 28 bpm, max HR of 168 bpm, and avg HR of 82 bpm. Predominant underlying rhythm was SR. 1 episode(s) of AV Block (2nd?? Mobitz II) occurred, lasting a total of 2 secs. Isolated SVEs were rare (<1.0%, 1492), SVE Couplets were rare (<1.0%, 4), and no SVE Triplets were present. Isolated VEs were rare (<1.0%, 5), and no VE Couplets or VE Triplets were present. There were 22 patient triggered events/diary entries. The majority of them corresponded to sinus rhythm with PAC. ??A few episodes corresponded to SR.    Lipid panel:  From 08/06/20 at [facility] showed:  Triglycerides- 159  Cholesterol- 146  HDL- 33  LDL- 87    Documentation assistance was provided by Halina Maidens, Scribe on January 16, 2021 at 4:37 PM for Joneen Roach, MD.     I have reviewed the documentation provided by the scribe and confirm that it accurately reflects the service I personally performed and the decisions made by me.  Signature: CSD  Date: 01/16/2021  Time: 4:37 PM

## 2021-01-17 ENCOUNTER — Encounter: Payer: Self-pay | Attending: Physical Medicine & Rehabilitation | Admitting: Physical Medicine & Rehabilitation

## 2021-01-22 NOTE — Unmapped (Signed)
Specialty Medication(s): Repatha    Mr.Check has been dis-enrolled from the [facility] specialty pharmacy services due to enrollment in a manufacturer assistance program that sends medicine directly to the patient.    Additional information provided to the patient: n/a    [name]  [facility] Specialty Pharmacist

## 2021-02-06 ENCOUNTER — Ambulatory Visit
Admit: 2021-02-06 | Discharge: 2021-02-06 | Disposition: A | Discharge status: Home / Self Care | Payer: BLUE CROSS/BLUE SHIELD

## 2021-02-06 DIAGNOSIS — R350 Frequency of micturition: Principal | ICD-10-CM

## 2021-02-06 DIAGNOSIS — M545 Acute bilateral low back pain without sciatica: Principal | ICD-10-CM

## 2021-02-07 ENCOUNTER — Ambulatory Visit: Payer: Self-pay | Admitting: Family Medicine

## 2021-02-07 DIAGNOSIS — Z79899 Other long term (current) drug therapy: Secondary | ICD-10-CM

## 2021-02-07 DIAGNOSIS — G8929 Other chronic pain: Secondary | ICD-10-CM

## 2021-02-07 DIAGNOSIS — Z79891 Long term (current) use of opiate analgesic: Secondary | ICD-10-CM

## 2021-02-10 MED ORDER — CHLORTHALIDONE 25 MG TABLET
ORAL_TABLET | 1 refills | 0 days | Status: CP
Start: 2021-02-10 — End: ?

## 2021-03-01 ENCOUNTER — Encounter: Payer: Self-pay | Admitting: Family Medicine

## 2021-03-03 ENCOUNTER — Ambulatory Visit (INDEPENDENT_AMBULATORY_CARE_PROVIDER_SITE_OTHER): Payer: Self-pay | Admitting: Family Medicine

## 2021-03-03 ENCOUNTER — Other Ambulatory Visit: Payer: Self-pay

## 2021-03-03 ENCOUNTER — Encounter: Payer: Self-pay | Admitting: Family Medicine

## 2021-03-03 VITALS — BP 112/78 | HR 92 | Temp 98.1°F | Resp 16 | Ht 71.0 in | Wt 243.7 lb

## 2021-03-03 DIAGNOSIS — Z79899 Other long term (current) drug therapy: Secondary | ICD-10-CM

## 2021-03-03 DIAGNOSIS — D649 Anemia, unspecified: Secondary | ICD-10-CM

## 2021-03-03 DIAGNOSIS — G8929 Other chronic pain: Secondary | ICD-10-CM

## 2021-03-03 DIAGNOSIS — E782 Mixed hyperlipidemia: Secondary | ICD-10-CM

## 2021-03-03 DIAGNOSIS — M79604 Pain in right leg: Secondary | ICD-10-CM

## 2021-03-03 DIAGNOSIS — Z5181 Encounter for therapeutic drug level monitoring: Secondary | ICD-10-CM

## 2021-03-03 DIAGNOSIS — I10 Essential (primary) hypertension: Secondary | ICD-10-CM

## 2021-03-03 DIAGNOSIS — M79605 Pain in left leg: Secondary | ICD-10-CM

## 2021-03-03 DIAGNOSIS — R52 Pain, unspecified: Secondary | ICD-10-CM

## 2021-03-03 DIAGNOSIS — Z79891 Long term (current) use of opiate analgesic: Secondary | ICD-10-CM

## 2021-03-03 DIAGNOSIS — F419 Anxiety disorder, unspecified: Secondary | ICD-10-CM

## 2021-03-03 DIAGNOSIS — D75839 Thrombocytosis, unspecified: Secondary | ICD-10-CM

## 2021-03-03 MED ORDER — HYDROCODONE-ACETAMINOPHEN 5-325 MG PO TABS: 0.5000 | ORAL_TABLET | Freq: Two times a day (BID) | ORAL | 0 refills | Status: DC | PRN
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
## 2021-03-03 NOTE — Patient Instructions (Addendum)
Look up pain clinics that are licensed to prescribe suboxone or methadone or you can look up NA- narcotic anonymous to find resources   Methadone Clinic in [place], [place] Here is a list of methadone clinics in [place], [place]. Get immediate methadone treatment in [facility] from a local and trusted Methadone clinic. Find medication-assisted treatment options in [place], [place] from one of the treatment centers below.  Are you or your loved one looking for addiction treatment? Call [phone] for help 24/7.  [facility] is a methadone clinic in [place], [place]. It is located at [address] [place] zip. [facility] provides full residential treatment, residential detoxification and long-term residential treatment. Other than methadone treatment, [facility] offers HIV and AIDS education, support and counseling, individual counseling and group counseling. [facility] provides medication assisted services to men and women.  [facility] is a methadone treatment facility in [place], [place]. It is situated at [address], [place] zip. [facility] provides regular outpatient treatment and outpatient treatment. Other than methadone treatment, [facility] offers group counseling, substance use education and health education services. [facility] provides medication assisted treatment to men and women. [facility] also supports Spanish patients for drug addiction treatment.  [facility] is a [facility]one clinic in [place], [place]. It is located at [address], [place] zip. [facility] provides outpatient treatment  and regular outpatient treatment. Other than methadone treatment, [facility] offers individual counseling, group counseling and family counseling. [facility] provides medication-assisted services to women and men. [facility] also supports [place] and [place] patients for drug addiction treatment.  [facility] is a methadone addiction treatment center in [place], [place]. It is located at [address] [phone] zip. [facility] provides outpatient methadone/buprenorphine or naltrexone treatment, outpatient treatment and outpatient detoxification. [facility] provides naltrexone administration, buprenorphine maintenance and buprenorphine detox. Other than methadone treatment, [facility] offers individual counseling, HIV and AIDS education, support and counseling and hepatitis education, counseling and support. [facility] provides medication assisted treatment to men and women.  FAQs for a Methadone Clinic in [place], [place]: What is the difference between suboxone and methadone treatment in [facility]? Treatment at a [facility] in [facility] is often similar to the treatment received at a methadone clinic. Both of the medication assisted treatment options work by reducing narcotic and opioid cravings for those who are addicted. A big difference between suboxone and methadone is that suboxone can be prescribed by doctors, while methadone treatment needs to be received at a clinic.  What other services are available at methadone clinics in [facility]? Services such as intervention, counseling, relapse prevention, behavior therapy and other support services are commonly found in methadone clinics throughout [place]. A methadone clinic may be part of a larger addiction treatment center, and as such, may have all of the services found  in [facility] drug rehabs.  Do I need to make an appointment before receiving methadone treatment? Each methadone clinic in [facility] has its own policy about what patients they will take. However, most clinics will accept both walk-ins and those who have made a previous appointment.  It is usually a good idea to call ahead of time just to confirm availability.  Is methadone treatment in [facility] always done on a residential basis? The short answer is no. Methadone treatment services in [facility] are available both in residential format, also known as inpatient, as well as outpatient format. Having both options allows addicted individuals to choose whether inpatient or outpatient clinical treatment will work better for their lifestyle.  What medication assisted treatment options are available in [facility]? There are a total of 3 medication assisted therapies that can be received in [facility]: methadone, buprenorphine (Suboxone) and Naltrexone. All three are approved by the FDA for drug addiction treatment. Methadone is the one that is most commonly used in addiction clinics.  Warning Signs of Opioid Misuse Opioids are powerful substances that relieve pain. Opioids include illegal drugs, such as heroin, as well as prescription pain medicines, such as codeine, morphine, hydrocodone, and fentanyl. Opioid misuse happens when opioids are used in a way that is different from how they were prescribed. Opioid misuse can lead to addiction (opioid use disorder) or taking too much at one time (opioid overdose). It is important to recognize signs of opioid misuse to help the person who is taking opioids get the right treatment. In the case of an overdose, emergency treatment can save his or her life if given soon enough. How can opioid misuse affect people? Signs of opioid misuse Taking another person's prescription opioid. Getting and taking an opioid without a legal  prescription. Taking more of an opioid than prescribed. Taking an opioid in a different way than it was prescribed, such as snorting, crushing, or injecting it. Taking an opioid to feel "high," relaxed, or energized. Taking other medicines or drugs with an opioid. Signs of opioid use disorder Having an uncontrollable need for the opioid. Taking the opioid despite the harmful effects. Needing more of the opioid to get the same effect (tolerance). Finding illegal ways to get a prescription opioid. These may include: Finding new health care providers to write a prescription (doctor shopping). Taking another person's medicine. Stealing. Buying the medicine illegally. Feeling like the opioid use is out of control. Feeling sick when not taking the opioid (withdrawal). Having mental, social, emotional, and behavioral warning signs of opioid abuse. These include: Depression, anxiety, restlessness, or irritability. Trouble sleeping and daytime fatigue. Itchy, flushed skin. Changes in: Appetite. Appearance, mood, and behavior. Relationships and social groups. Trouble with school or work, finances, or Event organiser. Use of other prescription medicines or drugs, such as marijuana. Where to find more information Centers for Disease Control and Prevention: http://www.wolf.info/ U.S. Department of Health and Human Services: AdSolver.gl [employer] on Drug Abuse: motorcyclefax.com Contact a health care provider if: You have concerns about the person who is taking opioids. You may notice the person: Is misusing an opioid medicine. Has signs of an opioid disorder. Has needle marks on the skin, recent or old. Needs help or treatment for opioid withdrawal or opioid use disorder. Is taking prescription opioids and is becoming too dependent on it. Get help right away if: The person who is taking opioids takes too much of an opioid (overdosed). Common symptoms of an overdose include: Sleepiness or  difficulty waking from sleep. Decrease in attention or confusion. Slurred speech. Slowed breathing and a slow pulse (bradycardia). Very small pupils. Other symptoms include: Pale, cool skin. Blue lips or fingernails. Seizures. Frothy or bubbly mucus coming out of the mouth. Opioid overdose is an emergency.  Do not wait to see if the symptoms will go away. Get medical help right away. Call your local emergency services (911 in the U.S.).  Summary Opioid misuse can lead to opioid use disorder and opioid overdose. A person who is taking a prescription opioid may be at risk if he or she takes the prescription opioid in a way that is not prescribed by a health care provider. It is important to recognize signs of opioid misuse, opioid use disorder, and opioid overdose to get the right treatment. Opioid overdose is an emergency. Do not wait to see if the symptoms will go away. Get medical help right away. This information is not intended to replace advice given to you by your health care provider. Make sure you discuss any questions you have with your health care provider. Document Revised: 07/03/2020 Document Reviewed: 07/03/2020 Elsevier Patient Education  Speculator.

## 2021-03-05 LAB — DRUG MONITOR, PANEL 1, W/CONF, URINE
Amphetamines: NEGATIVE ng/mL (ref <500)
Barbiturates: NEGATIVE ng/mL (ref <300)
Benzodiazepines: NEGATIVE ng/mL (ref <100)
Cocaine Metabolite: NEGATIVE ng/mL (ref <150)
Codeine: NEGATIVE ng/mL (ref <50)
Creatinine: 103.5 mg/dL (ref >=20.0)
Hydrocodone: NEGATIVE ng/mL (ref <50)
Hydromorphone: NEGATIVE ng/mL (ref <50)
Marijuana Metabolite: NEGATIVE ng/mL (ref <20)
Methadone Metabolite: NEGATIVE ng/mL (ref <100)
Morphine: NEGATIVE ng/mL (ref <50)
Norhydrocodone: NEGATIVE ng/mL (ref <50)
Noroxycodone: 3012 ng/mL — ABNORMAL HIGH (ref <50)
Opiates: NEGATIVE ng/mL (ref <100)
Oxidant: NEGATIVE ug/mL (ref <200)
Oxycodone: 876 ng/mL — ABNORMAL HIGH (ref <50)
Oxycodone: POSITIVE ng/mL — AB (ref <100)
Oxymorphone: 1081 ng/mL — ABNORMAL HIGH (ref <50)
Phencyclidine: NEGATIVE ng/mL (ref <25)
pH: 7 (ref 4.5–9.0)

## 2021-03-05 LAB — DM TEMPLATE

## 2021-03-17 ENCOUNTER — Telehealth: Payer: Self-pay | Admitting: Oncology

## 2021-03-17 ENCOUNTER — Inpatient Hospital Stay: Payer: Self-pay

## 2021-03-17 NOTE — Telephone Encounter (Signed)
Pt is having car trouble and needs to reschedule his appt for [place]. Would like something at the first of year. Call back is [phone]

## 2021-03-24 ENCOUNTER — Inpatient Hospital Stay: Payer: Self-pay

## 2021-03-24 ENCOUNTER — Ambulatory Visit: Payer: BLUE CROSS/BLUE SHIELD | Admitting: Oncology

## 2021-05-19 ENCOUNTER — Inpatient Hospital Stay: Payer: 59 | Attending: Oncology

## 2021-05-19 ENCOUNTER — Other Ambulatory Visit: Payer: Self-pay

## 2021-05-19 ENCOUNTER — Encounter: Payer: Self-pay | Admitting: Oncology

## 2021-05-19 DIAGNOSIS — R778 Other specified abnormalities of plasma proteins: Secondary | ICD-10-CM

## 2021-05-19 DIAGNOSIS — R768 Other specified abnormal immunological findings in serum: Secondary | ICD-10-CM | POA: Diagnosis not present

## 2021-05-19 DIAGNOSIS — R718 Other abnormality of red blood cells: Secondary | ICD-10-CM | POA: Insufficient documentation

## 2021-05-19 LAB — IRON AND TIBC
Iron: 40 ug/dL — ABNORMAL LOW (ref 45–182)
Saturation Ratios: 11 % — ABNORMAL LOW (ref 17.9–39.5)
TIBC: 364 ug/dL (ref 250–450)
UIBC: 324 ug/dL

## 2021-05-19 LAB — BASIC METABOLIC PANEL
Anion gap: 8 (ref 5–15)
BUN: 16 mg/dL (ref 6–20)
CO2: 25 mmol/L (ref 22–32)
Calcium: 9 mg/dL (ref 8.9–10.3)
Chloride: 98 mmol/L (ref 98–111)
Creatinine, Ser: 1.08 mg/dL (ref 0.61–1.24)
GFR, Estimated: >60 mL/min (ref >60)
Glucose, Bld: 107 mg/dL — ABNORMAL HIGH (ref 70–99)
Potassium: 3.6 mmol/L (ref 3.5–5.1)
Sodium: 131 mmol/L — ABNORMAL LOW (ref 135–145)

## 2021-05-19 LAB — CBC WITH DIFFERENTIAL/PLATELET
Abs Immature Granulocytes: 0.02 10*3/uL (ref 0.00–0.07)
Basophils Absolute: 0.1 10*3/uL (ref 0.0–0.1)
Basophils Relative: 1 %
Eosinophils Absolute: 0.1 10*3/uL (ref 0.0–0.5)
Eosinophils Relative: 2 %
HCT: 43.9 % (ref 39.0–52.0)
Hemoglobin: 13.4 g/dL (ref 13.0–17.0)
Immature Granulocytes: 0 %
Lymphocytes Relative: 25 %
Lymphs Abs: 1.6 10*3/uL (ref 0.7–4.0)
MCH: 23.3 pg — ABNORMAL LOW (ref 26.0–34.0)
MCHC: 30.5 g/dL (ref 30.0–36.0)
MCV: 76.5 fL — ABNORMAL LOW (ref 80.0–100.0)
Monocytes Absolute: 0.6 10*3/uL (ref 0.1–1.0)
Monocytes Relative: 9 %
Neutro Abs: 3.9 10*3/uL (ref 1.7–7.7)
Neutrophils Relative %: 63 %
Platelets: 422 10*3/uL — ABNORMAL HIGH (ref 150–400)
RBC: 5.74 MIL/uL (ref 4.22–5.81)
RDW: 16.8 % — ABNORMAL HIGH (ref 11.5–15.5)
WBC: 6.2 10*3/uL (ref 4.0–10.5)
nRBC: 0 % (ref 0.0–0.2)

## 2021-05-19 LAB — FERRITIN: Ferritin: 44 ng/mL (ref 24–336)

## 2021-05-20 LAB — KAPPA/LAMBDA LIGHT CHAINS
Kappa free light chain: 95.5 mg/L — ABNORMAL HIGH (ref 3.3–19.4)
Kappa, lambda light chain ratio: 2.85 — ABNORMAL HIGH (ref 0.26–1.65)
Lambda free light chains: 33.5 mg/L — ABNORMAL HIGH (ref 5.7–26.3)

## 2021-05-20 LAB — IGG, IGA, IGM
IgA: 387 mg/dL — ABNORMAL HIGH (ref 90–386)
IgG (Immunoglobin G), Serum: 2440 mg/dL — ABNORMAL HIGH (ref 603–1613)
IgM (Immunoglobulin M), Srm: 162 mg/dL (ref 20–172)

## 2021-05-22 ENCOUNTER — Other Ambulatory Visit: Payer: Self-pay

## 2021-05-22 ENCOUNTER — Inpatient Hospital Stay (HOSPITAL_BASED_OUTPATIENT_CLINIC_OR_DEPARTMENT_OTHER): Payer: 59 | Admitting: Nurse Practitioner

## 2021-05-22 ENCOUNTER — Inpatient Hospital Stay: Payer: 59

## 2021-05-22 ENCOUNTER — Encounter: Payer: Self-pay | Admitting: Nurse Practitioner

## 2021-05-22 VITALS — BP 136/91 | HR 77 | Temp 98.1°F | Resp 14 | Wt 247.0 lb

## 2021-05-22 DIAGNOSIS — D509 Iron deficiency anemia, unspecified: Secondary | ICD-10-CM

## 2021-05-22 DIAGNOSIS — R778 Other specified abnormalities of plasma proteins: Secondary | ICD-10-CM

## 2021-05-22 DIAGNOSIS — R718 Other abnormality of red blood cells: Secondary | ICD-10-CM | POA: Diagnosis not present
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
## 2021-05-22 NOTE — Progress Notes (Signed)
Pt in for follow up, reports being "a little winded today".

## 2021-06-03 ENCOUNTER — Ambulatory Visit: Payer: Self-pay | Admitting: Internal Medicine

## 2021-06-16 ENCOUNTER — Encounter: Payer: Self-pay | Admitting: Oncology

## 2021-06-30 ENCOUNTER — Ambulatory Visit: Payer: Self-pay | Admitting: Family Medicine

## 2021-06-30 DIAGNOSIS — G8929 Other chronic pain: Secondary | ICD-10-CM

## 2021-06-30 DIAGNOSIS — F419 Anxiety disorder, unspecified: Secondary | ICD-10-CM | POA: Insufficient documentation
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
## 2021-07-21 NOTE — Patient Instructions (Signed)

## 2021-07-22 ENCOUNTER — Ambulatory Visit (INDEPENDENT_AMBULATORY_CARE_PROVIDER_SITE_OTHER): Payer: 59 | Admitting: Family Medicine

## 2021-07-22 DIAGNOSIS — F329 Major depressive disorder, single episode, unspecified: Secondary | ICD-10-CM

## 2021-07-22 DIAGNOSIS — Z0000 Encounter for general adult medical examination without abnormal findings: Secondary | ICD-10-CM

## 2021-07-22 DIAGNOSIS — I10 Essential (primary) hypertension: Secondary | ICD-10-CM

## 2021-07-22 DIAGNOSIS — F419 Anxiety disorder, unspecified: Secondary | ICD-10-CM

## 2021-07-22 DIAGNOSIS — Z87898 Personal history of other specified conditions: Secondary | ICD-10-CM

## 2021-07-22 DIAGNOSIS — Z5181 Encounter for therapeutic drug level monitoring: Secondary | ICD-10-CM

## 2021-07-22 DIAGNOSIS — Z91199 Patient's noncompliance with other medical treatment and regimen due to unspecified reason: Secondary | ICD-10-CM

## 2021-07-22 DIAGNOSIS — K219 Gastro-esophageal reflux disease without esophagitis: Secondary | ICD-10-CM

## 2021-07-22 DIAGNOSIS — E782 Mixed hyperlipidemia: Secondary | ICD-10-CM

## 2021-07-22 NOTE — Progress Notes (Signed)
PT no-showed for appt ?Letter will be sent to pt warning of discharge from practice with a 3rd no show ? ?Delsa Grana, PA-C ? ?

## 2021-07-27 ENCOUNTER — Other Ambulatory Visit: Payer: Self-pay

## 2021-07-27 ENCOUNTER — Emergency Department
Admission: EM | Admit: 2021-07-27 | Discharge: 2021-07-27 | Disposition: A | Discharge status: Home / Self Care | Payer: 59 | Attending: Emergency Medicine | Admitting: Emergency Medicine

## 2021-07-27 ENCOUNTER — Emergency Department: Payer: 59

## 2021-07-27 DIAGNOSIS — I2510 Atherosclerotic heart disease of native coronary artery without angina pectoris: Secondary | ICD-10-CM | POA: Diagnosis not present

## 2021-07-27 DIAGNOSIS — E876 Hypokalemia: Secondary | ICD-10-CM | POA: Diagnosis not present

## 2021-07-27 DIAGNOSIS — R000 Tachycardia, unspecified: Secondary | ICD-10-CM | POA: Diagnosis not present

## 2021-07-27 DIAGNOSIS — R002 Palpitations: Secondary | ICD-10-CM | POA: Diagnosis not present

## 2021-07-27 DIAGNOSIS — I509 Heart failure, unspecified: Secondary | ICD-10-CM | POA: Insufficient documentation

## 2021-07-27 DIAGNOSIS — I110 Hypertensive heart disease with heart failure: Secondary | ICD-10-CM | POA: Insufficient documentation

## 2021-07-27 DIAGNOSIS — R0789 Other chest pain: Secondary | ICD-10-CM | POA: Diagnosis not present

## 2021-07-27 LAB — CBC
HCT: 44.3 % (ref 39.0–52.0)
Hemoglobin: 13.3 g/dL (ref 13.0–17.0)
MCH: 22.8 pg — ABNORMAL LOW (ref 26.0–34.0)
MCHC: 30 g/dL (ref 30.0–36.0)
MCV: 75.9 fL — ABNORMAL LOW (ref 80.0–100.0)
Platelets: 463 10*3/uL — ABNORMAL HIGH (ref 150–400)
RBC: 5.84 MIL/uL — ABNORMAL HIGH (ref 4.22–5.81)
RDW: 16.3 % — ABNORMAL HIGH (ref 11.5–15.5)
WBC: 8.6 10*3/uL (ref 4.0–10.5)
nRBC: 0 % (ref 0.0–0.2)

## 2021-07-27 LAB — BASIC METABOLIC PANEL
Anion gap: 10 (ref 5–15)
BUN: 15 mg/dL (ref 6–20)
CO2: 29 mmol/L (ref 22–32)
Calcium: 9.5 mg/dL (ref 8.9–10.3)
Chloride: 96 mmol/L — ABNORMAL LOW (ref 98–111)
Creatinine, Ser: 1.29 mg/dL — ABNORMAL HIGH (ref 0.61–1.24)
GFR, Estimated: >60 mL/min (ref >60)
Glucose, Bld: 98 mg/dL (ref 70–99)
Potassium: 3.2 mmol/L — ABNORMAL LOW (ref 3.5–5.1)
Sodium: 135 mmol/L (ref 135–145)

## 2021-07-27 LAB — TROPONIN I (HIGH SENSITIVITY)
Troponin I (High Sensitivity): 4 ng/L (ref <18)
Troponin I (High Sensitivity): 5 ng/L (ref <18)

## 2021-07-27 MED ORDER — POTASSIUM CHLORIDE CRYS ER 20 MEQ PO TBCR
40.0000 meq | EXTENDED_RELEASE_TABLET | Freq: Once | ORAL | Status: AC
  Administered 2021-07-27: 40 meq via ORAL
  Filled 2021-07-27: qty 2

## 2021-07-27 NOTE — ED Triage Notes (Signed)
Pt states is having sensation of tachycardia, palpitations and chest pressure. Pt states has had a previous mi. Pt states has had sweating.  ?

## 2021-07-27 NOTE — ED Provider Notes (Signed)
? ?[facility] ?Provider Note ? ? ? Event Date/Time  ? First MD Initiated Contact with Patient 07/27/21 2029   ?  (approximate) ? ? ?History  ? ?Chief Complaint ?Chest Pain ? ? ?HPI ? ?Dale Kennedy is a 46 y.o. male with past medical history of hypertension, hyperlipidemia, CAD, CHF, and intermittent atrial fibrillation who presents to the ED complaining of chest pain.  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  He states it was not particularly painful in his chest, instead only felt tight and similar to when he has dealt with anxiety in the past.  He denies any fevers, cough, or shortness of breath.  He now states that he has returned to normal with no chest discomfort or other symptoms.  He states he wanted to get checked out because of his cardiac history. ?  ? ? ?Physical Exam  ? ?Triage Vital Signs: ?ED Triage Vitals  ?Enc Vitals Group  ?   BP 07/27/21 2019 (!) 132/94  ?   Pulse Rate 07/27/21 2018 (!) 104  ?   Resp 07/27/21 2018 16  ?   Temp 07/27/21 2019 98.2 ?F (36.8 ?C)  ?   Temp Source 07/27/21 2018 Oral  ?   SpO2 07/27/21 2019 96 %  ?   Weight 07/27/21 2017 245 lb (111.1 kg)  ?   Height 07/27/21 2017 '5\' 11"'$  (1.803 m)  ?   Head Circumference --   ?   Peak Flow --   ?   Pain Score 07/27/21 2017 6  ?   Pain Loc --   ?   Pain Edu? --   ?   Excl. in [place]? --   ? ? ?Most recent vital signs: ?Vitals:  ? 07/27/21 2018 07/27/21 2019  ?BP:  (!) 132/94  ?Pulse: (!) 104   ?Resp: 16   ?Temp:  98.2 ?F (36.8 ?C)  ?SpO2:  96%  ? ? ?Constitutional: Alert and oriented. ?Eyes: Conjunctivae are normal. ?Head: Atraumatic. ?Nose: No congestion/rhinnorhea. ?Mouth/Throat: Mucous membranes are moist.  ?Cardiovascular: Normal rate, regular rhythm. Grossly normal heart sounds.  2+ radial pulses bilaterally. ?Respiratory: Normal respiratory effort.  No retractions. Lungs  CTAB. ?Gastrointestinal: Soft and nontender. No distention. ?Musculoskeletal: No lower extremity tenderness nor edema.  ?Neurologic:  Normal speech and language. No gross focal neurologic deficits are appreciated. ? ? ? ?ED Results / Procedures / Treatments  ? ?Labs ?(all labs ordered are listed, but only abnormal results are displayed) ?Labs Reviewed  ?BASIC METABOLIC PANEL - Abnormal; Notable for the following components:  ?    Result Value  ? Potassium 3.2 (*)   ? Chloride 96 (*)   ? Creatinine, Ser 1.29 (*)   ? All other components within normal limits  ?CBC - Abnormal; Notable for the following components:  ? RBC 5.84 (*)   ? MCV 75.9 (*)   ? MCH 22.8 (*)   ? RDW 16.3 (*)   ? Platelets 463 (*)   ? All other components within normal limits  ?TROPONIN I (HIGH SENSITIVITY)  ?TROPONIN I (HIGH SENSITIVITY)  ? ? ? ?EKG ? ?ED ECG REPORT ?Tempie Hoist, the attending physician, personally viewed and interpreted this ECG. ? ? Date: 07/27/2021 ? EKG Time: 20:17 ? Rate: 104 ? Rhythm: sinus tachycardia ? Axis: Normal ? Intervals:none ?  ST&T Change: None ? ?RADIOLOGY ?Chest x-ray reviewed by me with no infiltrate, edema, or effusion. ? ?PROCEDURES: ? ?Critical Care performed: No ? ?.1-3 Lead EKG Interpretation ?Performed by: Blake Divine, MD ?Authorized by: Blake Divine, MD  ? ?  Interpretation: normal   ?  ECG rate:  60-80 ?  ECG rate assessment: normal   ?  Rhythm: sinus rhythm   ?  Ectopy: none   ?  Conduction: normal   ? ? ?MEDICATIONS ORDERED IN ED: ?Medications  ?potassium chloride SA (KLOR-CON M) CR tablet 40 mEq (has no administration in time range)  ? ? ? ?IMPRESSION / MDM / ASSESSMENT AND PLAN / ED COURSE  ?I reviewed the triage vital signs and the nursing notes. ?             ?               ? ?46 y.o. male with past medical history of hypertension, hyperlipidemia, CAD, CHF, and intermittent atrial fibrillation who presents to the ED complaining of episode of chest tightness and palpitations occurring  just prior to arrival and since resolving. ? ?Differential diagnosis includes, but is not limited to, ACS, PE, pneumonia, pneumothorax, arrhythmia, electrolyte abnormality, anxiety, musculoskeletal pain, and GERD. ? ?Patient is nontoxic-appearing and in no acute distress, vital signs remarkable for mild tachycardia but otherwise reassuring.  EKG is consistent with sinus tachycardia, no evidence of arrhythmia or ischemic changes.  Initial labs are reassuring, BMP remarkable only for mild hypokalemia, CBC without anemia, and troponin within normal limits.  We will observe patient on cardiac monitor and repeat troponin, but if this is unremarkable he would be appropriate for discharge home with PCP or cardiology follow-up. ? ?Repeat troponin within normal limits, no events noted on cardiac monitor.  Patient remains asymptomatic here in the ED and is appropriate for discharge home, low suspicion for cardiac etiology of his symptoms.  He was counseled to follow-up with his cardiologist and to return to the ED for new or worsening symptoms, patient agrees with plan. ? ?The patient is on the cardiac monitor to evaluate for evidence of arrhythmia and/or significant heart rate changes. ? ?  ? ? ?FINAL CLINICAL IMPRESSION(S) / ED DIAGNOSES  ? ?Final diagnoses:  ?Atypical chest pain  ?Palpitations  ? ? ? ?Rx / DC Orders  ? ?ED Discharge Orders   ? ? None  ? ?  ? ? ? ?Note:  This document was prepared using Dragon voice recognition software and may include unintentional dictation errors. ?  ?Blake Divine, MD ?07/27/21 2241 ? ?

## 2021-08-11 MED ORDER — CHLORTHALIDONE 25 MG TABLET
ORAL_TABLET | 1 refills | 0 days | Status: CP
Start: 2021-08-11 — End: ?

## 2021-08-12 ENCOUNTER — Other Ambulatory Visit: Payer: Self-pay

## 2021-08-12 DIAGNOSIS — F419 Anxiety disorder, unspecified: Secondary | ICD-10-CM

## 2021-08-12 DIAGNOSIS — F329 Major depressive disorder, single episode, unspecified: Secondary | ICD-10-CM

## 2021-08-12 DIAGNOSIS — G4700 Insomnia, unspecified: Secondary | ICD-10-CM

## 2021-08-13 MED ORDER — SERTRALINE HCL 25 MG PO TABS: 25.0000 mg | ORAL_TABLET | Freq: Every day | ORAL | 3 refills | Status: DC
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
## 2021-08-19 ENCOUNTER — Inpatient Hospital Stay: Payer: 59 | Attending: Oncology

## 2021-08-19 DIAGNOSIS — R768 Other specified abnormal immunological findings in serum: Secondary | ICD-10-CM | POA: Insufficient documentation

## 2021-08-19 DIAGNOSIS — R718 Other abnormality of red blood cells: Secondary | ICD-10-CM | POA: Insufficient documentation

## 2021-08-20 ENCOUNTER — Other Ambulatory Visit: Payer: 59

## 2021-08-21 ENCOUNTER — Inpatient Hospital Stay: Payer: 59

## 2021-08-21 ENCOUNTER — Inpatient Hospital Stay: Payer: 59 | Admitting: Oncology

## 2021-08-21 DIAGNOSIS — R778 Other specified abnormalities of plasma proteins: Secondary | ICD-10-CM

## 2021-08-21 DIAGNOSIS — R768 Other specified abnormal immunological findings in serum: Secondary | ICD-10-CM | POA: Diagnosis not present

## 2021-08-21 DIAGNOSIS — R718 Other abnormality of red blood cells: Secondary | ICD-10-CM | POA: Diagnosis not present

## 2021-08-21 DIAGNOSIS — D509 Iron deficiency anemia, unspecified: Secondary | ICD-10-CM

## 2021-08-21 LAB — CBC WITH DIFFERENTIAL/PLATELET
Abs Immature Granulocytes: 0.03 10*3/uL (ref 0.00–0.07)
Basophils Absolute: 0.1 10*3/uL (ref 0.0–0.1)
Basophils Relative: 1 %
Eosinophils Absolute: 0.2 10*3/uL (ref 0.0–0.5)
Eosinophils Relative: 3 %
HCT: 42.7 % (ref 39.0–52.0)
Hemoglobin: 13 g/dL (ref 13.0–17.0)
Immature Granulocytes: 1 %
Lymphocytes Relative: 23 %
Lymphs Abs: 1.4 10*3/uL (ref 0.7–4.0)
MCH: 23 pg — ABNORMAL LOW (ref 26.0–34.0)
MCHC: 30.4 g/dL (ref 30.0–36.0)
MCV: 75.7 fL — ABNORMAL LOW (ref 80.0–100.0)
Monocytes Absolute: 0.6 10*3/uL (ref 0.1–1.0)
Monocytes Relative: 11 %
Neutro Abs: 3.7 10*3/uL (ref 1.7–7.7)
Neutrophils Relative %: 61 %
Platelets: 428 10*3/uL — ABNORMAL HIGH (ref 150–400)
RBC: 5.64 MIL/uL (ref 4.22–5.81)
RDW: 16.6 % — ABNORMAL HIGH (ref 11.5–15.5)
WBC: 6 10*3/uL (ref 4.0–10.5)
nRBC: 0 % (ref 0.0–0.2)

## 2021-08-21 LAB — BASIC METABOLIC PANEL
Anion gap: 9 (ref 5–15)
BUN: 17 mg/dL (ref 6–20)
CO2: 26 mmol/L (ref 22–32)
Calcium: 8.9 mg/dL (ref 8.9–10.3)
Chloride: 99 mmol/L (ref 98–111)
Creatinine, Ser: 1.17 mg/dL (ref 0.61–1.24)
GFR, Estimated: >60 mL/min (ref >60)
Glucose, Bld: 104 mg/dL — ABNORMAL HIGH (ref 70–99)
Potassium: 3.5 mmol/L (ref 3.5–5.1)
Sodium: 134 mmol/L — ABNORMAL LOW (ref 135–145)

## 2021-08-21 LAB — IRON AND TIBC
Iron: 36 ug/dL — ABNORMAL LOW (ref 45–182)
Saturation Ratios: 10 % — ABNORMAL LOW (ref 17.9–39.5)
TIBC: 353 ug/dL (ref 250–450)
UIBC: 317 ug/dL

## 2021-08-21 LAB — FERRITIN: Ferritin: 59 ng/mL (ref 24–336)

## 2021-08-21 NOTE — Progress Notes (Signed)
  [place]  Telephone:([phone] Fax:(336) [phone]  ID: DOHN STCLAIR OB: [birth year]  MR#: [id number]  NAT#:[id number]  Patient Care Team: Delsa Grana, PA-C as PCP - General (Family Medicine) Marjean Donna, MD as Referring Physician (Cardiology) [name], MD as Consulting Physician (Gastroenterology) Lloyd Huger, MD as Consulting Physician (Oncology)      Lloyd Huger, MD   08/21/2021 3:17 PM    This encounter was created in error - please disregard.

## 2021-08-22 ENCOUNTER — Encounter: Payer: Self-pay | Admitting: Oncology

## 2021-08-22 LAB — KAPPA/LAMBDA LIGHT CHAINS
Kappa free light chain: 110.7 mg/L — ABNORMAL HIGH (ref 3.3–19.4)
Kappa, lambda light chain ratio: 3.08 — ABNORMAL HIGH (ref 0.26–1.65)
Lambda free light chains: 35.9 mg/L — ABNORMAL HIGH (ref 5.7–26.3)

## 2021-08-22 LAB — IGG, IGA, IGM
IgA: 418 mg/dL — ABNORMAL HIGH (ref 90–386)
IgG (Immunoglobin G), Serum: 2675 mg/dL — ABNORMAL HIGH (ref 603–1613)
IgM (Immunoglobulin M), Srm: 157 mg/dL (ref 20–172)

## 2021-08-26 ENCOUNTER — Ambulatory Visit: Payer: 59 | Admitting: Family Medicine

## 2021-08-26 ENCOUNTER — Other Ambulatory Visit: Payer: Self-pay

## 2021-08-26 DIAGNOSIS — E782 Mixed hyperlipidemia: Secondary | ICD-10-CM

## 2021-08-27 ENCOUNTER — Other Ambulatory Visit: Payer: Self-pay

## 2021-08-27 ENCOUNTER — Encounter: Payer: Self-pay | Admitting: Family Medicine

## 2021-08-27 ENCOUNTER — Ambulatory Visit (INDEPENDENT_AMBULATORY_CARE_PROVIDER_SITE_OTHER): Payer: 59 | Admitting: Family Medicine

## 2021-08-27 VITALS — BP 132/82 | HR 86 | Temp 97.8°F | Resp 16 | Ht 71.0 in | Wt 251.8 lb

## 2021-08-27 DIAGNOSIS — M79604 Pain in right leg: Secondary | ICD-10-CM

## 2021-08-27 DIAGNOSIS — F419 Anxiety disorder, unspecified: Secondary | ICD-10-CM

## 2021-08-27 DIAGNOSIS — R29898 Other symptoms and signs involving the musculoskeletal system: Secondary | ICD-10-CM

## 2021-08-27 DIAGNOSIS — E782 Mixed hyperlipidemia: Secondary | ICD-10-CM

## 2021-08-27 DIAGNOSIS — R52 Pain, unspecified: Secondary | ICD-10-CM

## 2021-08-27 DIAGNOSIS — Z5181 Encounter for therapeutic drug level monitoring: Secondary | ICD-10-CM | POA: Diagnosis not present

## 2021-08-27 DIAGNOSIS — I10 Essential (primary) hypertension: Secondary | ICD-10-CM | POA: Diagnosis not present

## 2021-08-27 DIAGNOSIS — G8929 Other chronic pain: Secondary | ICD-10-CM

## 2021-08-27 DIAGNOSIS — Z87898 Personal history of other specified conditions: Secondary | ICD-10-CM

## 2021-08-27 DIAGNOSIS — Z79899 Other long term (current) drug therapy: Secondary | ICD-10-CM

## 2021-08-27 DIAGNOSIS — T07XXXA Unspecified multiple injuries, initial encounter: Secondary | ICD-10-CM

## 2021-08-27 DIAGNOSIS — M79605 Pain in left leg: Secondary | ICD-10-CM

## 2021-08-27 DIAGNOSIS — R7303 Prediabetes: Secondary | ICD-10-CM

## 2021-08-27 MED ORDER — DULOXETINE HCL 20 MG PO CPEP: 20.0000 mg | ORAL_CAPSULE | Freq: Every day | ORAL | 3 refills | Status: DC

## 2021-08-27 MED ORDER — BACLOFEN 5 MG PO TABS: 5.0000 mg | ORAL_TABLET | Freq: Three times a day (TID) | ORAL | 3 refills | Status: DC | PRN
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
## 2021-08-28 LAB — HEPATIC FUNCTION PANEL
AG Ratio: 0.9 (calc) — ABNORMAL LOW (ref 1.0–2.5)
ALT: 38 U/L (ref 9–46)
AST: 24 U/L (ref 10–40)
Albumin: 4.1 g/dL (ref 3.6–5.1)
Alkaline phosphatase (APISO): 144 U/L — ABNORMAL HIGH (ref 36–130)
Bilirubin, Direct: 0 mg/dL (ref 0.0–0.2)
Globulin: 4.6 g/dL (calc) — ABNORMAL HIGH (ref 1.9–3.7)
Indirect Bilirubin: 0.3 mg/dL (calc) (ref 0.2–1.2)
Total Bilirubin: 0.3 mg/dL (ref 0.2–1.2)
Total Protein: 8.7 g/dL — ABNORMAL HIGH (ref 6.1–8.1)

## 2021-08-28 LAB — LIPID PANEL
Cholesterol: 171 mg/dL (ref <200)
HDL: 37 mg/dL — ABNORMAL LOW (ref >=40)
LDL Cholesterol (Calc): 105 mg/dL (calc) — ABNORMAL HIGH
Non-HDL Cholesterol (Calc): 134 mg/dL (calc) — ABNORMAL HIGH (ref <130)
Total CHOL/HDL Ratio: 4.6 (calc) (ref <5.0)
Triglycerides: 172 mg/dL — ABNORMAL HIGH (ref <150)

## 2021-08-28 LAB — HEMOGLOBIN A1C
Hgb A1c MFr Bld: 5.8 % of total Hgb — ABNORMAL HIGH (ref <5.7)
Mean Plasma Glucose: 120 mg/dL
eAG (mmol/L): 6.6 mmol/L

## 2021-08-28 MED ORDER — EZETIMIBE 10 MG PO TABS: 10.0000 mg | ORAL_TABLET | Freq: Every day | ORAL | 3 refills | Status: DC
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
## 2021-09-02 ENCOUNTER — Inpatient Hospital Stay: Payer: 59 | Admitting: Oncology

## 2021-09-02 DIAGNOSIS — R778 Other specified abnormalities of plasma proteins: Secondary | ICD-10-CM

## 2021-09-02 DIAGNOSIS — D509 Iron deficiency anemia, unspecified: Secondary | ICD-10-CM

## 2021-09-02 NOTE — Progress Notes (Unsigned)
Patient scheduled for mychart visit to follow up on labs today. Patient denies any concerns.

## 2021-09-03 ENCOUNTER — Encounter: Payer: Self-pay | Admitting: Oncology
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
## 2021-09-09 NOTE — Assessment & Plan Note (Signed)
On repatha and zetia, tolerating He often has CP, ER w/ups negative, reviewed Seeing specialists Due for repeat lipids

## 2021-09-09 NOTE — Assessment & Plan Note (Signed)
Pt is not following the prior aggreement He has gone for periods of time without narcotic pain meds He has missed appts He has not taking consistently the other medications for managing pain and sx He also has not made any effort to go back to psych for assistance with how past trauma/likely PTSD/and anxiety trigger and cause some of his pain sx

## 2021-09-09 NOTE — Assessment & Plan Note (Signed)
Pt needs to f/up with psych

## 2021-09-09 NOTE — Assessment & Plan Note (Signed)
Encouraged him again to work on other meds that may control pain/sx and get reestablished with psych Trial of cymbalta Refill of lower dose muscle relaxer No NSAIDS with GI hx Can use tylenol Use lyrica daily

## 2021-09-09 NOTE — Assessment & Plan Note (Signed)
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

## 2021-09-09 NOTE — Assessment & Plan Note (Signed)
BP at goal today on chlorthalidone  Recent ER labs/chemistry done and reviewed today

## 2021-09-10 ENCOUNTER — Inpatient Hospital Stay: Payer: 59 | Attending: Oncology | Admitting: Oncology

## 2021-09-10 DIAGNOSIS — D509 Iron deficiency anemia, unspecified: Secondary | ICD-10-CM

## 2021-09-10 DIAGNOSIS — R778 Other specified abnormalities of plasma proteins: Secondary | ICD-10-CM

## 2021-09-11 ENCOUNTER — Encounter: Payer: Self-pay | Admitting: Family Medicine

## 2021-09-11 ENCOUNTER — Encounter: Payer: Self-pay | Admitting: Oncology

## 2021-11-24 ENCOUNTER — Ambulatory Visit: Payer: 59 | Admitting: Family Medicine

## 2021-11-24 DIAGNOSIS — R7303 Prediabetes: Secondary | ICD-10-CM

## 2021-11-24 DIAGNOSIS — G8929 Other chronic pain: Secondary | ICD-10-CM

## 2021-11-24 DIAGNOSIS — I10 Essential (primary) hypertension: Secondary | ICD-10-CM

## 2021-11-24 DIAGNOSIS — E782 Mixed hyperlipidemia: Secondary | ICD-10-CM

## 2021-11-24 DIAGNOSIS — F419 Anxiety disorder, unspecified: Secondary | ICD-10-CM

## 2021-12-17 DIAGNOSIS — M5416 Radiculopathy, lumbar region: Secondary | ICD-10-CM | POA: Diagnosis not present

## 2021-12-22 DIAGNOSIS — F1120 Opioid dependence, uncomplicated: Secondary | ICD-10-CM | POA: Diagnosis not present

## 2021-12-22 DIAGNOSIS — R69 Illness, unspecified: Secondary | ICD-10-CM | POA: Diagnosis not present

## 2021-12-22 DIAGNOSIS — I252 Old myocardial infarction: Secondary | ICD-10-CM | POA: Diagnosis not present

## 2021-12-23 DIAGNOSIS — I2510 Atherosclerotic heart disease of native coronary artery without angina pectoris: Principal | ICD-10-CM

## 2021-12-23 DIAGNOSIS — E785 Hyperlipidemia, unspecified: Principal | ICD-10-CM

## 2021-12-23 MED ORDER — REPATHA SYRINGE 140 MG/ML SUBCUTANEOUS SYRINGE
SUBCUTANEOUS | 5 refills | 28 days
Start: 2021-12-23 — End: ?

## 2021-12-24 DIAGNOSIS — I2510 Atherosclerotic heart disease of native coronary artery without angina pectoris: Principal | ICD-10-CM

## 2021-12-24 DIAGNOSIS — E785 Hyperlipidemia, unspecified: Principal | ICD-10-CM

## 2021-12-24 MED ORDER — REPATHA SYRINGE 140 MG/ML SUBCUTANEOUS SYRINGE
SUBCUTANEOUS | 5 refills | 28 days | Status: CP
Start: 2021-12-24 — End: ?

## 2021-12-26 DIAGNOSIS — I2510 Atherosclerotic heart disease of native coronary artery without angina pectoris: Principal | ICD-10-CM

## 2021-12-26 MED ORDER — ALIROCUMAB 150 MG/ML SUBCUTANEOUS PEN INJECTOR
SUBCUTANEOUS | 3 refills | 84 days | Status: CP
Start: 2021-12-26 — End: ?

## 2022-01-05 DIAGNOSIS — I2510 Atherosclerotic heart disease of native coronary artery without angina pectoris: Principal | ICD-10-CM

## 2022-01-05 MED ORDER — ALIROCUMAB 150 MG/ML SUBCUTANEOUS PEN INJECTOR
SUBCUTANEOUS | 3 refills | 84 days | Status: CP
Start: 2022-01-05 — End: ?

## 2022-01-09 DIAGNOSIS — E785 Hyperlipidemia, unspecified: Principal | ICD-10-CM

## 2022-01-12 ENCOUNTER — Ambulatory Visit: Admit: 2022-01-12 | Discharge: 2022-01-13

## 2022-01-13 DIAGNOSIS — R69 Illness, unspecified: Secondary | ICD-10-CM | POA: Diagnosis not present

## 2022-01-13 DIAGNOSIS — R110 Nausea: Secondary | ICD-10-CM | POA: Diagnosis not present

## 2022-01-13 DIAGNOSIS — F1120 Opioid dependence, uncomplicated: Secondary | ICD-10-CM | POA: Diagnosis not present

## 2022-01-19 DIAGNOSIS — I2510 Atherosclerotic heart disease of native coronary artery without angina pectoris: Principal | ICD-10-CM

## 2022-01-19 MED ORDER — ALIROCUMAB 150 MG/ML SUBCUTANEOUS PEN INJECTOR
SUBCUTANEOUS | 3 refills | 84 days | Status: CP
Start: 2022-01-19 — End: ?

## 2022-01-25 MED ORDER — CHLORTHALIDONE 25 MG TABLET
ORAL_TABLET | 1 refills | 0 days
Start: 2022-01-25 — End: ?

## 2022-01-30 ENCOUNTER — Ambulatory Visit
Admit: 2022-01-30 | Discharge: 2022-01-31 | Payer: PRIVATE HEALTH INSURANCE | Attending: Student in an Organized Health Care Education/Training Program | Primary: Student in an Organized Health Care Education/Training Program

## 2022-02-02 ENCOUNTER — Encounter: Payer: Self-pay | Admitting: Oncology

## 2022-02-02 DIAGNOSIS — R69 Illness, unspecified: Secondary | ICD-10-CM | POA: Diagnosis not present

## 2022-02-02 DIAGNOSIS — F419 Anxiety disorder, unspecified: Secondary | ICD-10-CM | POA: Diagnosis not present

## 2022-02-02 DIAGNOSIS — K5900 Constipation, unspecified: Secondary | ICD-10-CM | POA: Diagnosis not present

## 2022-02-03 ENCOUNTER — Encounter: Payer: Self-pay | Admitting: Oncology

## 2022-02-12 DIAGNOSIS — I10 Essential (primary) hypertension: Principal | ICD-10-CM

## 2022-02-12 MED ORDER — CHLORTHALIDONE 25 MG TABLET
ORAL_TABLET | Freq: Every day | ORAL | 3 refills | 90 days | Status: CP
Start: 2022-02-12 — End: ?

## 2022-02-16 DIAGNOSIS — F419 Anxiety disorder, unspecified: Secondary | ICD-10-CM | POA: Diagnosis not present

## 2022-02-16 DIAGNOSIS — K5900 Constipation, unspecified: Secondary | ICD-10-CM | POA: Diagnosis not present

## 2022-02-16 DIAGNOSIS — R69 Illness, unspecified: Secondary | ICD-10-CM | POA: Diagnosis not present

## 2022-03-05 DIAGNOSIS — I2510 Atherosclerotic heart disease of native coronary artery without angina pectoris: Principal | ICD-10-CM

## 2022-03-05 MED ORDER — ALIROCUMAB 150 MG/ML SUBCUTANEOUS PEN INJECTOR
SUBCUTANEOUS | 3 refills | 84 days | Status: CP
Start: 2022-03-05 — End: ?
  Filled 2022-03-16: qty 2, 28d supply, fill #0

## 2022-03-09 DIAGNOSIS — I2510 Atherosclerotic heart disease of native coronary artery without angina pectoris: Principal | ICD-10-CM

## 2022-03-11 NOTE — Unmapped (Signed)
[facility] Specialty Medication Onboarding    Specialty Medication: PRALUENT PEN 150 mg/mL subcutaneous injection (alirocumab)  Prior Authorization: Not Required   Financial Assistance: Yes - copay card approved as secondary   Final Copay/Day Supply: $35 / 28    Insurance Restrictions: Yes - max 1 month supply     Notes to Pharmacist:     The triage team has completed the benefits investigation and has determined that the patient is able to fill this medication at [facility]. Please contact the patient to complete the onboarding or follow up with the prescribing physician as needed.

## 2022-03-13 NOTE — Unmapped (Signed)
[facility]   Specialty Lite Counseling    Ryan Hale is a 46 y.o. male with CAD who I am counseling today on continuation of therapy.  I am speaking to the patient.    Was a Nurse, learning disability used for this call? No    Verified patient's date of birth / HIPAA.    Specialty Lite medication(s) to be sent: General Specialty: Praluent      Non-specialty medications/supplies to be sent: none at this time      Medications not needed at this time: n/a         An offer to provide counseling to the patient regarding their medication was made. The patient declined counseling       Current Medications (including OTC/herbals), Comorbidities and Allergies     Current Outpatient Medications   Medication Sig Dispense Refill    acetaminophen (TYLENOL) 500 MG tablet Take 1 tablet (500 mg total) by mouth every four (4) hours as needed.      albuterol HFA 90 mcg/actuation inhaler Inhale 2 puffs every six (6) hours as needed.      alirocumab (PRALUENT) 150 mg/mL subcutaneous injection Inject the contents of one pen (150 mg total) under the skin every fourteen (14) days. 6 mL 3    aspirin 81 MG chewable tablet Chew 1 tablet (81 mg total) daily. 30 tablet 11    budesonide-formoteroL (SYMBICORT) 160-4.5 mcg/actuation inhaler Inhale 2 puffs. (Patient not taking: No sig reported)      buprenorphine-naloxone (SUBOXONE) 8-2 mg sublingual film PLACE 2 FILMS EVERY DAY BY SUBLINGUAL ROUTE AS DIRECTED FOR 30 DAYS.      chlorthalidone (HYGROTON) 25 MG tablet Take 1 tablet (25 mg total) by mouth daily. TAKE 1 TABLET(25 MG) BY MOUTH DAILY 90 tablet 3    DEXILANT 60 mg capsule daily.  (Patient not taking: No sig reported)      diclofenac sodium (VOLTAREN) 1 % gel Apply 2 g topically two (2) times a day as needed.      ezetimibe (ZETIA) 10 mg tablet TAKE 1 TABLET(10 MG) BY MOUTH DAILY 90 tablet 3    fluticasone propionate (FLONASE) 50 mcg/actuation nasal spray 2 sprays into each nostril daily. 16 g 0    HYDROcodone-acetaminophen (NORCO) 5-325 mg per tablet Take 0.5-1 tablets by mouth every six (6) hours as needed.  (Patient not taking: Reported on 01/16/2021)      loratadine (CLARITIN) 10 mg tablet Take 1 tablet (10 mg total) by mouth daily as needed.      naloxone (NARCAN) 4 mg nasal spray SPRAY 1 SPRAY NASALLY EVERY 2 MINUTES WHEN NEEDED TO REVERSE OPIOID OVERDOSE      nitroglycerin (NITROSTAT) 0.4 MG SL tablet Place 1 tablet (0.4 mg total) under the tongue every five (5) minutes as needed for chest pain. (Patient not taking: Reported on 01/30/2022) 30 tablet 2    pregabalin (LYRICA) 25 MG capsule Take 25 mg poqam and 50 mg poqpm for chronic pain (Patient not taking: Reported on 01/16/2021)      sertraline (ZOLOFT) 25 MG tablet Take 1 tablet (25 mg total) by mouth daily.      tadalafiL (CIALIS) 5 MG tablet Take 1 tablet (5 mg total) by mouth once as needed for erectile dysfunction for up to 1 dose. 30 tablet 1    TiZANidine (ZANAFLEX) 4 MG capsule Take 4 mg by mouth Three (3) times a day as needed.  (Patient not taking: Reported on 12/07/2019)  No current facility-administered medications for this visit.       Allergies   Allergen Reactions    Ace Inhibitors Swelling    Lisinopril Swelling    Norvasc [Amlodipine] Palpitations       Patient Active Problem List   Diagnosis    Chest pain    HTN (hypertension)    Anxiety    HLD (hyperlipidemia)    GERD (gastroesophageal reflux disease)    Coronary artery disease involving native coronary artery of native heart without angina pectoris    Adjustment disorder with mixed anxiety and depressed mood    OSA (obstructive sleep apnea)    Near syncope    Statin intolerance    COVID-19 virus infection       Reviewed and up to date in Epic.    Appropriateness of Therapy     Prescription has been clinically reviewed: Yes    Financial Information     Medication Assistance provided: Copay Assistance    Anticipated copay of $35 (28 days) reviewed with patient.     Patient Specific Needs     Does the patient have any physical, cognitive, or cultural barriers? No    Does the patient have adequate living arrangements? (i.e. the ability to store and take their medication appropriately) Yes    Did you identify any home environmental safety or security hazards? No    Patient prefers to have medications discussed with  Patient     Is the patient or caregiver able to read and understand education materials at a high school level or above? Yes    Patient's primary language is  English     Is the patient high risk? No    SOCIAL DETERMINANTS OF HEALTH     At the [facility], we have learned that life circumstances - like trouble affording food, housing, utilities, or transportation can affect the health of many of our patients.   That is why we wanted to ask: are you currently experiencing any life circumstances that are negatively impacting your health and/or quality of life? Patient declined to answer    Social Determinants of Health     Financial Resource Strain: Not on file   Internet Connectivity: Not on file   Food Insecurity: Food Insecurity Present (10/30/2018)    Hunger Vital Sign     Worried About Running Out of Food in the Last Year: Sometimes true     Ran Out of Food in the Last Year: Sometimes true   Tobacco Use: Medium Risk (01/30/2022)    Patient History     Smoking Tobacco Use: Former     Smokeless Tobacco Use: Never     Passive Exposure: Not on file   Housing/Utilities: Not on file   Alcohol Use: Not on file   Transportation Needs: Not on file   Substance Use: Not on file   Health Literacy: Not on file   Physical Activity: Not on file   Interpersonal Safety: Not on file   Stress: Not on file   Intimate Partner Violence: Not on file   Depression: Not on file   Social Connections: Not on file       Would you be willing to receive help with any of the needs that you have identified today? Not applicable    Delivery Information     Verified delivery address.    Scheduled delivery date: 03/16/22    Expected start date: 03/16/22    Medication will be delivered via  Same Day Courier to the prescription address in [facility].  This shipment will not require a signature.      Explained the services we provide at [facility] and that each month we will send text messages and/or mychart messages to set up refills. Informed patient that refills should be scheduled 7-10 days prior to when they will run out of medication. Informed patient that a welcome packet, containing information about our pharmacy and other support services, a Notice of Privacy Practices, and a drug information handout will be sent.      The patient or caregiver noted above participated in the development of this care plan and knows that they can request review of or adjustments to the care plan at any time.      Patient or caregiver verbalized understanding of the above information as well as how to contact the pharmacy at [phone] option 4 with any questions/concerns.  The pharmacy is open Monday through Friday 8:30am-4:30pm.  A pharmacist is available 24/7 via pager to answer any clinical questions they may have.      [name], PharmD  [facility] Specialty Pharmacist

## 2022-03-18 DIAGNOSIS — F1120 Opioid dependence, uncomplicated: Secondary | ICD-10-CM | POA: Diagnosis not present

## 2022-03-18 DIAGNOSIS — K5900 Constipation, unspecified: Secondary | ICD-10-CM | POA: Diagnosis not present

## 2022-04-15 DIAGNOSIS — K5900 Constipation, unspecified: Secondary | ICD-10-CM | POA: Diagnosis not present

## 2022-04-15 DIAGNOSIS — F1120 Opioid dependence, uncomplicated: Secondary | ICD-10-CM | POA: Diagnosis not present

## 2022-04-22 NOTE — Unmapped (Signed)
Specialty Medication(s): Praluent    Ryan Hale has been dis-enrolled from the [facility] specialty pharmacy services due to a pharmacy change. The patient is now filling at [facility].    Additional information provided to the patient: n/a    [name], PharmD  [facility] Specialty Pharmacist

## 2022-05-13 DIAGNOSIS — F1120 Opioid dependence, uncomplicated: Secondary | ICD-10-CM | POA: Diagnosis not present

## 2022-06-03 ENCOUNTER — Encounter: Payer: Self-pay | Admitting: Oncology

## 2022-06-07 DIAGNOSIS — F1421 Cocaine dependence, in remission: Secondary | ICD-10-CM | POA: Diagnosis not present

## 2022-06-07 DIAGNOSIS — K219 Gastro-esophageal reflux disease without esophagitis: Secondary | ICD-10-CM | POA: Diagnosis not present

## 2022-06-07 DIAGNOSIS — E785 Hyperlipidemia, unspecified: Secondary | ICD-10-CM | POA: Diagnosis not present

## 2022-06-07 DIAGNOSIS — Z7982 Long term (current) use of aspirin: Secondary | ICD-10-CM | POA: Diagnosis not present

## 2022-06-07 DIAGNOSIS — Z87891 Personal history of nicotine dependence: Secondary | ICD-10-CM | POA: Diagnosis not present

## 2022-06-07 DIAGNOSIS — Z8249 Family history of ischemic heart disease and other diseases of the circulatory system: Secondary | ICD-10-CM | POA: Diagnosis not present

## 2022-06-07 DIAGNOSIS — E669 Obesity, unspecified: Secondary | ICD-10-CM | POA: Diagnosis not present

## 2022-06-07 DIAGNOSIS — F1121 Opioid dependence, in remission: Secondary | ICD-10-CM | POA: Diagnosis not present

## 2022-06-07 DIAGNOSIS — K5900 Constipation, unspecified: Secondary | ICD-10-CM | POA: Diagnosis not present

## 2022-06-07 DIAGNOSIS — I10 Essential (primary) hypertension: Secondary | ICD-10-CM | POA: Diagnosis not present

## 2022-06-07 DIAGNOSIS — Z833 Family history of diabetes mellitus: Secondary | ICD-10-CM | POA: Diagnosis not present

## 2022-06-07 DIAGNOSIS — F419 Anxiety disorder, unspecified: Secondary | ICD-10-CM | POA: Diagnosis not present

## 2022-06-10 ENCOUNTER — Other Ambulatory Visit: Payer: Self-pay | Admitting: *Deleted

## 2022-06-10 DIAGNOSIS — F419 Anxiety disorder, unspecified: Secondary | ICD-10-CM | POA: Diagnosis not present

## 2022-06-10 DIAGNOSIS — F1120 Opioid dependence, uncomplicated: Secondary | ICD-10-CM | POA: Diagnosis not present

## 2022-06-10 DIAGNOSIS — D509 Iron deficiency anemia, unspecified: Secondary | ICD-10-CM

## 2022-06-11 ENCOUNTER — Inpatient Hospital Stay: Payer: 59 | Attending: Oncology

## 2022-06-11 DIAGNOSIS — Z87891 Personal history of nicotine dependence: Secondary | ICD-10-CM | POA: Insufficient documentation

## 2022-06-11 DIAGNOSIS — Z79899 Other long term (current) drug therapy: Secondary | ICD-10-CM | POA: Insufficient documentation

## 2022-06-11 DIAGNOSIS — I509 Heart failure, unspecified: Secondary | ICD-10-CM | POA: Diagnosis not present

## 2022-06-11 DIAGNOSIS — D509 Iron deficiency anemia, unspecified: Secondary | ICD-10-CM

## 2022-06-11 DIAGNOSIS — E611 Iron deficiency: Secondary | ICD-10-CM | POA: Diagnosis not present

## 2022-06-11 DIAGNOSIS — I4891 Unspecified atrial fibrillation: Secondary | ICD-10-CM | POA: Insufficient documentation

## 2022-06-11 DIAGNOSIS — R768 Other specified abnormal immunological findings in serum: Secondary | ICD-10-CM | POA: Diagnosis not present

## 2022-06-11 DIAGNOSIS — I2510 Atherosclerotic heart disease of native coronary artery without angina pectoris: Secondary | ICD-10-CM | POA: Diagnosis not present

## 2022-06-11 DIAGNOSIS — I252 Old myocardial infarction: Secondary | ICD-10-CM | POA: Insufficient documentation

## 2022-06-11 DIAGNOSIS — I110 Hypertensive heart disease with heart failure: Secondary | ICD-10-CM | POA: Diagnosis not present

## 2022-06-11 LAB — CBC WITH DIFFERENTIAL/PLATELET
Abs Immature Granulocytes: 0.01 10*3/uL (ref 0.00–0.07)
Basophils Absolute: 0.1 10*3/uL (ref 0.0–0.1)
Basophils Relative: 1 %
Eosinophils Absolute: 0.2 10*3/uL (ref 0.0–0.5)
Eosinophils Relative: 3 %
HCT: 40 % (ref 39.0–52.0)
Hemoglobin: 12.2 g/dL — ABNORMAL LOW (ref 13.0–17.0)
Immature Granulocytes: 0 %
Lymphocytes Relative: 27 %
Lymphs Abs: 1.8 10*3/uL (ref 0.7–4.0)
MCH: 23.3 pg — ABNORMAL LOW (ref 26.0–34.0)
MCHC: 30.5 g/dL (ref 30.0–36.0)
MCV: 76.5 fL — ABNORMAL LOW (ref 80.0–100.0)
Monocytes Absolute: 0.5 10*3/uL (ref 0.1–1.0)
Monocytes Relative: 8 %
Neutro Abs: 4.1 10*3/uL (ref 1.7–7.7)
Neutrophils Relative %: 61 %
Platelets: 431 10*3/uL — ABNORMAL HIGH (ref 150–400)
RBC: 5.23 MIL/uL (ref 4.22–5.81)
RDW: 16.1 % — ABNORMAL HIGH (ref 11.5–15.5)
WBC: 6.6 10*3/uL (ref 4.0–10.5)
nRBC: 0 % (ref 0.0–0.2)

## 2022-06-11 LAB — IRON AND TIBC
Iron: 34 ug/dL — ABNORMAL LOW (ref 45–182)
Saturation Ratios: 10 % — ABNORMAL LOW (ref 17.9–39.5)
TIBC: 335 ug/dL (ref 250–450)
UIBC: 301 ug/dL

## 2022-06-11 LAB — BASIC METABOLIC PANEL - CANCER CENTER ONLY
Anion gap: 7 (ref 5–15)
BUN: 15 mg/dL (ref 6–20)
CO2: 26 mmol/L (ref 22–32)
Calcium: 8.6 mg/dL — ABNORMAL LOW (ref 8.9–10.3)
Chloride: 99 mmol/L (ref 98–111)
Creatinine: 1.04 mg/dL (ref 0.61–1.24)
GFR, Estimated: >60 mL/min (ref >60)
Glucose, Bld: 102 mg/dL — ABNORMAL HIGH (ref 70–99)
Potassium: 3.5 mmol/L (ref 3.5–5.1)
Sodium: 132 mmol/L — ABNORMAL LOW (ref 135–145)

## 2022-06-11 LAB — FERRITIN: Ferritin: 65 ng/mL (ref 24–336)

## 2022-06-12 LAB — KAPPA/LAMBDA LIGHT CHAINS
Kappa free light chain: 120.6 mg/L — ABNORMAL HIGH (ref 3.3–19.4)
Kappa, lambda light chain ratio: 2.46 — ABNORMAL HIGH (ref 0.26–1.65)
Lambda free light chains: 49 mg/L — ABNORMAL HIGH (ref 5.7–26.3)

## 2022-06-13 LAB — IGG, IGA, IGM
IgA: 429 mg/dL — ABNORMAL HIGH (ref 90–386)
IgG (Immunoglobin G), Serum: 3564 mg/dL — ABNORMAL HIGH (ref 603–1613)
IgM (Immunoglobulin M), Srm: 128 mg/dL (ref 20–172)

## 2022-06-19 ENCOUNTER — Encounter: Payer: Self-pay | Admitting: Oncology

## 2022-06-19 ENCOUNTER — Inpatient Hospital Stay (HOSPITAL_BASED_OUTPATIENT_CLINIC_OR_DEPARTMENT_OTHER): Payer: 59 | Admitting: Oncology

## 2022-06-19 VITALS — BP 136/92 | HR 77 | Temp 96.5°F | Resp 16 | Ht 71.0 in | Wt 240.0 lb

## 2022-06-19 DIAGNOSIS — I252 Old myocardial infarction: Secondary | ICD-10-CM | POA: Diagnosis not present

## 2022-06-19 DIAGNOSIS — I2510 Atherosclerotic heart disease of native coronary artery without angina pectoris: Secondary | ICD-10-CM | POA: Diagnosis not present

## 2022-06-19 DIAGNOSIS — Z79899 Other long term (current) drug therapy: Secondary | ICD-10-CM | POA: Diagnosis not present

## 2022-06-19 DIAGNOSIS — I509 Heart failure, unspecified: Secondary | ICD-10-CM | POA: Diagnosis not present

## 2022-06-19 DIAGNOSIS — E611 Iron deficiency: Secondary | ICD-10-CM | POA: Diagnosis not present

## 2022-06-19 DIAGNOSIS — R768 Other specified abnormal immunological findings in serum: Secondary | ICD-10-CM | POA: Diagnosis not present

## 2022-06-19 DIAGNOSIS — I4891 Unspecified atrial fibrillation: Secondary | ICD-10-CM | POA: Diagnosis not present

## 2022-06-19 DIAGNOSIS — I110 Hypertensive heart disease with heart failure: Secondary | ICD-10-CM | POA: Diagnosis not present

## 2022-06-19 DIAGNOSIS — R778 Other specified abnormalities of plasma proteins: Secondary | ICD-10-CM | POA: Diagnosis not present

## 2022-06-19 DIAGNOSIS — Z87891 Personal history of nicotine dependence: Secondary | ICD-10-CM | POA: Diagnosis not present
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
## 2022-07-20 DIAGNOSIS — F1120 Opioid dependence, uncomplicated: Secondary | ICD-10-CM | POA: Diagnosis not present

## 2022-08-03 ENCOUNTER — Encounter: Payer: Self-pay | Admitting: Oncology

## 2022-08-12 DIAGNOSIS — F1120 Opioid dependence, uncomplicated: Secondary | ICD-10-CM | POA: Diagnosis not present

## 2022-09-01 DIAGNOSIS — F1120 Opioid dependence, uncomplicated: Secondary | ICD-10-CM | POA: Diagnosis not present

## 2022-09-04 ENCOUNTER — Other Ambulatory Visit: Payer: Self-pay | Admitting: Family Medicine

## 2022-09-04 DIAGNOSIS — G4700 Insomnia, unspecified: Secondary | ICD-10-CM

## 2022-09-04 DIAGNOSIS — F1120 Opioid dependence, uncomplicated: Secondary | ICD-10-CM | POA: Diagnosis not present

## 2022-09-04 DIAGNOSIS — F419 Anxiety disorder, unspecified: Secondary | ICD-10-CM

## 2022-09-04 DIAGNOSIS — F329 Major depressive disorder, single episode, unspecified: Secondary | ICD-10-CM

## 2022-09-05 ENCOUNTER — Other Ambulatory Visit: Payer: Self-pay | Admitting: Family Medicine

## 2022-09-05 DIAGNOSIS — E782 Mixed hyperlipidemia: Secondary | ICD-10-CM

## 2022-09-07 NOTE — Telephone Encounter (Signed)
Patient needs OV for additional refills.  Requested Prescriptions  Pending Prescriptions Disp Refills   ezetimibe (ZETIA) 10 MG tablet [Pharmacy Med Name: EZETIMIBE 10 MG TABLET] 30 tablet 0    Sig: TAKE 1 TABLET BY MOUTH EVERY MORNING FOR CHOLESTEROL     Cardiovascular:  Antilipid - Sterol Transport Inhibitors Failed - 09/05/2022  8:34 AM      Failed - AST in normal range and within 360 days    AST  Date Value Ref Range Status  08/27/2021 24 10 - 40 U/L Final   SGOT(AST)  Date Value Ref Range Status  07/22/2013 17 15 - 37 Unit/L Final         Failed - ALT in normal range and within 360 days    ALT  Date Value Ref Range Status  08/27/2021 38 9 - 46 U/L Final   SGPT (ALT)  Date Value Ref Range Status  07/22/2013 51 12 - 78 U/L Final         Failed - Valid encounter within last 12 months    Recent Outpatient Visits           1 year ago Primary hypertension   [place] [facility] Danelle Berry, PA-C   1 year ago No-show for appointment   [facility] Danelle Berry, PA-C   1 year ago Chronic pain of multiple sites   [facility] Danelle Berry, PA-C   1 year ago Mixed hyperlipidemia   [facility] Danelle Berry, PA-C   2 years ago Encounter for chronic pain management   [place] [facility], Sheliah Mends, PA-C              Failed - Lipid Panel in normal range within the last 12 months    Cholesterol, Total  Date Value Ref Range Status  05/31/2015 165 100 - 199 mg/dL Final   Cholesterol  Date Value Ref Range Status  08/27/2021 171 <200 mg/dL Final   LDL Cholesterol (Calc)  Date Value Ref Range Status  08/27/2021 105 (H) mg/dL (calc) Final    Comment:    Reference range: <100 . Desirable range <100 mg/dL for primary prevention;   <70 mg/dL for patients with CHD or diabetic patients  with > or = 2 CHD risk factors. Marland Kitchen LDL-C is now calculated using the  Martin-Hopkins  calculation, which is a validated novel method providing  better accuracy than the Friedewald equation in the  estimation of LDL-C.  Horald Pollen et al. Lenox Ahr. 8295;621(30): 2061-2068  (http://education.[employer].com/faq/FAQ164)    HDL  Date Value Ref Range Status  08/27/2021 37 (L) > OR = 40 mg/dL Final  86/57/8469 40 >62 mg/dL Final   Triglycerides  Date Value Ref Range Status  08/27/2021 172 (H) <150 mg/dL Final         Passed - Patient is not pregnant

## 2022-09-24 DIAGNOSIS — F1120 Opioid dependence, uncomplicated: Secondary | ICD-10-CM | POA: Diagnosis not present

## 2022-09-25 DIAGNOSIS — F1120 Opioid dependence, uncomplicated: Secondary | ICD-10-CM | POA: Diagnosis not present

## 2022-09-25 DIAGNOSIS — F419 Anxiety disorder, unspecified: Secondary | ICD-10-CM | POA: Diagnosis not present

## 2022-10-04 ENCOUNTER — Other Ambulatory Visit: Payer: Self-pay | Admitting: Family Medicine

## 2022-10-04 DIAGNOSIS — E782 Mixed hyperlipidemia: Secondary | ICD-10-CM

## 2022-10-05 ENCOUNTER — Other Ambulatory Visit: Payer: Self-pay | Admitting: Family Medicine

## 2022-10-05 DIAGNOSIS — F329 Major depressive disorder, single episode, unspecified: Secondary | ICD-10-CM

## 2022-10-05 DIAGNOSIS — G4700 Insomnia, unspecified: Secondary | ICD-10-CM

## 2022-10-05 DIAGNOSIS — F419 Anxiety disorder, unspecified: Secondary | ICD-10-CM

## 2022-10-06 NOTE — Telephone Encounter (Signed)
Pt has already been notified and has an appt Friday 10/09/22. Pt will come in for vitals and possible labs once you complete him his virtual here in the office

## 2022-10-09 ENCOUNTER — Telehealth: Payer: Self-pay | Admitting: Family Medicine

## 2022-10-15 ENCOUNTER — Encounter: Payer: Self-pay | Admitting: Oncology

## 2022-10-15 ENCOUNTER — Ambulatory Visit (INDEPENDENT_AMBULATORY_CARE_PROVIDER_SITE_OTHER): Payer: Self-pay | Admitting: Family Medicine

## 2022-10-15 ENCOUNTER — Encounter: Payer: Self-pay | Admitting: Family Medicine

## 2022-10-15 VITALS — BP 128/76 | HR 82 | Resp 16 | Ht 71.0 in | Wt 243.0 lb

## 2022-10-15 DIAGNOSIS — Z955 Presence of coronary angioplasty implant and graft: Secondary | ICD-10-CM | POA: Diagnosis not present

## 2022-10-15 DIAGNOSIS — I2510 Atherosclerotic heart disease of native coronary artery without angina pectoris: Secondary | ICD-10-CM | POA: Diagnosis not present

## 2022-10-15 DIAGNOSIS — Z5181 Encounter for therapeutic drug level monitoring: Secondary | ICD-10-CM | POA: Diagnosis not present

## 2022-10-15 DIAGNOSIS — I10 Essential (primary) hypertension: Secondary | ICD-10-CM | POA: Diagnosis not present

## 2022-10-15 DIAGNOSIS — R7303 Prediabetes: Secondary | ICD-10-CM | POA: Diagnosis not present

## 2022-10-15 DIAGNOSIS — J301 Allergic rhinitis due to pollen: Secondary | ICD-10-CM | POA: Diagnosis not present

## 2022-10-15 DIAGNOSIS — G720 Drug-induced myopathy: Secondary | ICD-10-CM | POA: Diagnosis not present

## 2022-10-15 DIAGNOSIS — T466X5A Adverse effect of antihyperlipidemic and antiarteriosclerotic drugs, initial encounter: Secondary | ICD-10-CM

## 2022-10-15 DIAGNOSIS — K219 Gastro-esophageal reflux disease without esophagitis: Secondary | ICD-10-CM

## 2022-10-15 DIAGNOSIS — F419 Anxiety disorder, unspecified: Secondary | ICD-10-CM

## 2022-10-15 DIAGNOSIS — E782 Mixed hyperlipidemia: Secondary | ICD-10-CM

## 2022-10-15 MED ORDER — EZETIMIBE 10 MG PO TABS: 10.0000 mg | ORAL_TABLET | Freq: Every day | ORAL | 1 refills | Status: DC
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
## 2022-10-16 LAB — COMPLETE METABOLIC PANEL WITH GFR
AG Ratio: 0.7 (calc) — ABNORMAL LOW (ref 1.0–2.5)
ALT: 26 U/L (ref 9–46)
AST: 21 U/L (ref 10–40)
Albumin: 3.7 g/dL (ref 3.6–5.1)
Alkaline phosphatase (APISO): 132 U/L — ABNORMAL HIGH (ref 36–130)
BUN: 13 mg/dL (ref 7–25)
CO2: 28 mmol/L (ref 20–32)
Calcium: 9.4 mg/dL (ref 8.6–10.3)
Chloride: 100 mmol/L (ref 98–110)
Creat: 1.02 mg/dL (ref 0.60–1.29)
Globulin: 5 g/dL (calc) — ABNORMAL HIGH (ref 1.9–3.7)
Glucose, Bld: 95 mg/dL (ref 65–99)
Potassium: 3.8 mmol/L (ref 3.5–5.3)
Sodium: 136 mmol/L (ref 135–146)
Total Bilirubin: 0.2 mg/dL (ref 0.2–1.2)
Total Protein: 8.7 g/dL — ABNORMAL HIGH (ref 6.1–8.1)
eGFR: 92 mL/min/{1.73_m2} (ref >=60)

## 2022-10-16 LAB — HEMOGLOBIN A1C
Hgb A1c MFr Bld: 6 % of total Hgb — ABNORMAL HIGH (ref <5.7)
Mean Plasma Glucose: 126 mg/dL
eAG (mmol/L): 7 mmol/L

## 2022-10-16 LAB — LIPID PANEL
Cholesterol: 138 mg/dL (ref <200)
HDL: 37 mg/dL — ABNORMAL LOW (ref >=40)
LDL Cholesterol (Calc): 79 mg/dL (calc)
Non-HDL Cholesterol (Calc): 101 mg/dL (calc) (ref <130)
Total CHOL/HDL Ratio: 3.7 (calc) (ref <5.0)
Triglycerides: 120 mg/dL (ref <150)

## 2022-10-20 ENCOUNTER — Encounter: Payer: Self-pay | Admitting: Gastroenterology

## 2022-10-20 ENCOUNTER — Ambulatory Visit (INDEPENDENT_AMBULATORY_CARE_PROVIDER_SITE_OTHER): Payer: 59 | Admitting: Gastroenterology

## 2022-10-20 VITALS — BP 131/84 | HR 84 | Temp 98.3°F | Ht 71.0 in | Wt 244.0 lb

## 2022-10-20 DIAGNOSIS — K5909 Other constipation: Secondary | ICD-10-CM

## 2022-10-20 MED ORDER — PEG 3350-KCL-NA BICARB-NACL 420 G PO SOLR: ORAL | 0 refills | Status: DC

## 2022-10-20 NOTE — Patient Instructions (Addendum)
Please take daily Miralax (17 grams daily)   Polyethylene Glycol Powder for Solution What is this medication? POLYETHYLENE GLYCOL (pol ee ETH i leen; GLYE col) prevents and treats occasional constipation. It works by increasing the amount of water your intestine absorbs. This softens the stool, making it easier to have a bowel movement. It also increases pressure, which prompts the muscles in your intestines to move stool. It belongs to a group of medications called laxatives. This medicine may be used for other purposes; ask your health care provider or pharmacist if you have questions. COMMON BRAND NAME(S): GaviLax, GIALAX, GlycoLax, Healthylax, MiraLax, Smooth LAX, Vita Health What should I tell my care team before I take this medication? They need to know if you have any of these conditions: History of blockage in your bowels Nausea Phenylketonuria Stomach or intestine problem Stomach pain Sudden change in bowel habit lasting more than 2 weeks Vomiting An unusual or allergic reaction to polyethylene glycol (PEG), other medications, foods, dyes, or preservatives Pregnant or trying to get pregnant Breast-feeding How should I use this medication? Take this medication by mouth. Take it as directed on the label. Add the right dose to 4 to 8 ounces or 120 to 240 mL of water, juice, soda, coffee, or tea. Do not mix this medication with foods or other liquids. Do not combine with starch-based thickeners (e.g., flour, cornstarch, arrowroot, tapioca, xanthan gum). Mix well. Drink the solution. Do not use it more often than directed. Talk to your care team about the use of this medication in children. While it may be given to children as young as 16 years for selected conditions, precautions do apply. Overdosage: If you think you have taken too much of this medicine contact a poison control center or emergency room at once. NOTE: This medicine is only for you. Do not share this medicine with  others. What if I miss a dose? If you miss a dose, take it as soon as you can. If it is almost time for your next dose, take only that dose. Do not take double or extra doses. What may interact with this medication? Interactions are not expected. This list may not describe all possible interactions. Give your health care provider a list of all the medicines, herbs, non-prescription drugs, or dietary supplements you use. Also tell them if you smoke, drink alcohol, or use illegal drugs. Some items may interact with your medicine. What should I watch for while using this medication? Do not use for more than one week without advice from your care team. If your constipation returns, check with your care team. Drink plenty of water while taking this medication. Drinking water helps decrease constipation. Stop using this medication and contact your care team if you experience any rectal bleeding or do not have a bowel movement after use. These could be signs of a more serious condition. What side effects may I notice from receiving this medication? Side effects that you should report to your care team as soon as possible: Allergic reactions--skin rash, itching, hives, swelling of the face, lips, tongue, or throat Side effects that usually do not require medical attention (report to your care team if they continue or are bothersome): Bloating Gas Nausea Stomach cramping This list may not describe all possible side effects. Call your doctor for medical advice about side effects. You may report side effects to FDA at 1-800-FDA-1088. Where should I keep my medication? Keep out of the reach of children and pets. Store at  room temperature between 20 and 25 degrees C (68 and 77 degrees F). Get rid of any unused medication after the expiration date. To get rid of medications that are no longer needed or have expired: Take the medication to a medication take-back program. Check with your pharmacy or law  enforcement to find a location. If you cannot return the medication, check the label or package insert to see if the medication should be thrown out in the garbage or flushed down the toilet. If you are not sure, ask your care team. If it is safe to put it in the trash, pour the medication out of the container. Mix the medication with cat litter, dirt, coffee grounds, or other unwanted substance. Seal the mixture in a bag or container. Put it in the trash. NOTE: This sheet is a summary. It may not cover all possible information. If you have questions about this medicine, talk to your doctor, pharmacist, or health care provider.  2024 Elsevier/Gold Standard (2021-10-01 00:00:00) . This could be bought at your local pharmacy.
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
## 2022-10-21 ENCOUNTER — Ambulatory Visit
Admit: 2022-10-21 | Discharge: 2022-10-21 | Discharge status: Against Medical Advice | Payer: BLUE CROSS/BLUE SHIELD | Attending: Emergency Medicine

## 2022-11-03 ENCOUNTER — Other Ambulatory Visit: Payer: Self-pay | Admitting: Family Medicine

## 2022-11-03 DIAGNOSIS — G4700 Insomnia, unspecified: Secondary | ICD-10-CM

## 2022-11-03 DIAGNOSIS — F419 Anxiety disorder, unspecified: Secondary | ICD-10-CM

## 2022-11-03 DIAGNOSIS — F329 Major depressive disorder, single episode, unspecified: Secondary | ICD-10-CM

## 2022-11-05 DIAGNOSIS — F1120 Opioid dependence, uncomplicated: Secondary | ICD-10-CM | POA: Diagnosis not present

## 2022-11-06 DIAGNOSIS — F1120 Opioid dependence, uncomplicated: Secondary | ICD-10-CM | POA: Diagnosis not present

## 2022-11-18 ENCOUNTER — Ambulatory Visit
Admit: 2022-11-18 | Discharge: 2022-11-18 | Disposition: A | Discharge status: Home / Self Care | Payer: PRIVATE HEALTH INSURANCE | Attending: Emergency Medicine

## 2022-11-18 ENCOUNTER — Emergency Department
Admit: 2022-11-18 | Discharge: 2022-11-18 | Disposition: A | Discharge status: Home / Self Care | Payer: PRIVATE HEALTH INSURANCE | Attending: Emergency Medicine

## 2022-11-18 DIAGNOSIS — R079 Chest pain, unspecified: Principal | ICD-10-CM

## 2022-11-18 DIAGNOSIS — Z955 Presence of coronary angioplasty implant and graft: Secondary | ICD-10-CM | POA: Diagnosis not present

## 2022-11-18 DIAGNOSIS — Z7982 Long term (current) use of aspirin: Secondary | ICD-10-CM | POA: Diagnosis not present

## 2022-11-18 DIAGNOSIS — I249 Acute ischemic heart disease, unspecified: Secondary | ICD-10-CM | POA: Diagnosis not present

## 2022-11-18 DIAGNOSIS — K219 Gastro-esophageal reflux disease without esophagitis: Secondary | ICD-10-CM | POA: Diagnosis not present

## 2022-11-18 DIAGNOSIS — E785 Hyperlipidemia, unspecified: Secondary | ICD-10-CM | POA: Diagnosis not present

## 2022-11-18 DIAGNOSIS — I110 Hypertensive heart disease with heart failure: Secondary | ICD-10-CM | POA: Diagnosis not present

## 2022-11-18 DIAGNOSIS — Z7951 Long term (current) use of inhaled steroids: Secondary | ICD-10-CM | POA: Diagnosis not present

## 2022-11-18 DIAGNOSIS — I509 Heart failure, unspecified: Secondary | ICD-10-CM | POA: Diagnosis not present

## 2022-11-18 DIAGNOSIS — R0602 Shortness of breath: Secondary | ICD-10-CM | POA: Diagnosis not present

## 2022-11-18 DIAGNOSIS — D649 Anemia, unspecified: Secondary | ICD-10-CM | POA: Diagnosis not present

## 2022-11-18 DIAGNOSIS — R002 Palpitations: Secondary | ICD-10-CM | POA: Diagnosis not present

## 2022-11-18 DIAGNOSIS — R0789 Other chest pain: Secondary | ICD-10-CM | POA: Diagnosis not present

## 2022-11-18 DIAGNOSIS — I2510 Atherosclerotic heart disease of native coronary artery without angina pectoris: Secondary | ICD-10-CM | POA: Diagnosis not present

## 2022-11-18 DIAGNOSIS — F419 Anxiety disorder, unspecified: Secondary | ICD-10-CM | POA: Diagnosis not present

## 2022-11-22 DIAGNOSIS — R079 Chest pain, unspecified: Secondary | ICD-10-CM | POA: Diagnosis not present

## 2022-11-23 DIAGNOSIS — F1120 Opioid dependence, uncomplicated: Secondary | ICD-10-CM | POA: Diagnosis not present

## 2022-11-26 DIAGNOSIS — F1120 Opioid dependence, uncomplicated: Secondary | ICD-10-CM | POA: Diagnosis not present

## 2022-11-27 DIAGNOSIS — F1120 Opioid dependence, uncomplicated: Secondary | ICD-10-CM | POA: Diagnosis not present

## 2022-11-29 ENCOUNTER — Other Ambulatory Visit: Payer: Self-pay | Admitting: Family Medicine

## 2022-11-29 DIAGNOSIS — F329 Major depressive disorder, single episode, unspecified: Secondary | ICD-10-CM

## 2022-11-29 DIAGNOSIS — F419 Anxiety disorder, unspecified: Secondary | ICD-10-CM

## 2022-11-29 DIAGNOSIS — G4700 Insomnia, unspecified: Secondary | ICD-10-CM

## 2022-11-30 ENCOUNTER — Other Ambulatory Visit: Payer: Self-pay | Admitting: Emergency Medicine

## 2022-11-30 ENCOUNTER — Other Ambulatory Visit: Payer: Self-pay | Admitting: Nurse Practitioner

## 2022-11-30 DIAGNOSIS — F419 Anxiety disorder, unspecified: Secondary | ICD-10-CM

## 2022-11-30 MED ORDER — SERTRALINE HCL 25 MG PO TABS
25.0000 mg | ORAL_TABLET | Freq: Every day | ORAL | 0 refills | Status: DC
Start: 2022-11-30 — End: 2022-12-29

## 2022-11-30 NOTE — Telephone Encounter (Signed)
Patient states needs refill and has been taking it consecutively and needs refills?

## 2022-11-30 NOTE — Telephone Encounter (Signed)
Pt is completely out as of yesterday. Last seen 10/15/2022

## 2022-12-08 DIAGNOSIS — F1120 Opioid dependence, uncomplicated: Secondary | ICD-10-CM | POA: Diagnosis not present

## 2022-12-10 DIAGNOSIS — F1120 Opioid dependence, uncomplicated: Secondary | ICD-10-CM | POA: Diagnosis not present

## 2022-12-14 DIAGNOSIS — F1120 Opioid dependence, uncomplicated: Secondary | ICD-10-CM | POA: Diagnosis not present

## 2022-12-17 ENCOUNTER — Other Ambulatory Visit: Payer: Self-pay | Admitting: *Deleted

## 2022-12-17 DIAGNOSIS — D509 Iron deficiency anemia, unspecified: Secondary | ICD-10-CM

## 2022-12-17 DIAGNOSIS — R778 Other specified abnormalities of plasma proteins: Secondary | ICD-10-CM

## 2022-12-18 DIAGNOSIS — E785 Hyperlipidemia, unspecified: Principal | ICD-10-CM

## 2022-12-21 ENCOUNTER — Inpatient Hospital Stay: Payer: 59 | Attending: Oncology

## 2022-12-22 ENCOUNTER — Other Ambulatory Visit: Payer: Self-pay | Admitting: Nurse Practitioner

## 2022-12-22 DIAGNOSIS — I2510 Atherosclerotic heart disease of native coronary artery without angina pectoris: Principal | ICD-10-CM

## 2022-12-22 DIAGNOSIS — F419 Anxiety disorder, unspecified: Secondary | ICD-10-CM

## 2022-12-22 MED ORDER — REPATHA SURECLICK 140 MG/ML SUBCUTANEOUS PEN INJECTOR
SUBCUTANEOUS | 6 refills | 84 days | Status: CP
Start: 2022-12-22 — End: ?
  Filled 2023-02-02: qty 2, 28d supply, fill #0

## 2022-12-23 NOTE — Telephone Encounter (Signed)
Requested medication (s) are due for refill today: Yes  Requested medication (s) are on the active medication list: Yes  Last refill:  11/30/22  Future visit scheduled: Yes  Notes to clinic:  Unable to refill per protocol due to diagnosis code needed.      Requested Prescriptions  Pending Prescriptions Disp Refills   sertraline (ZOLOFT) 25 MG tablet [Pharmacy Med Name: SERTRALINE HCL 25 MG TABLET] 90 tablet 1    Sig: Take 1 tablet (25 mg total) by mouth daily.     Psychiatry:  Antidepressants - SSRI - sertraline Passed - 12/22/2022  2:32 PM      Passed - AST in normal range and within 360 days    AST  Date Value Ref Range Status  10/15/2022 21 10 - 40 U/L Final   SGOT(AST)  Date Value Ref Range Status  07/22/2013 17 15 - 37 Unit/L Final         Passed - ALT in normal range and within 360 days    ALT  Date Value Ref Range Status  10/15/2022 26 9 - 46 U/L Final   SGPT (ALT)  Date Value Ref Range Status  07/22/2013 51 12 - 78 U/L Final         Passed - Completed PHQ-2 or PHQ-9 in the last 360 days      Passed - Valid encounter within last 6 months    Recent Outpatient Visits           2 months ago Mixed hyperlipidemia   [facility] Danelle Berry, PA-C   1 year ago Primary hypertension   [facility] Danelle Berry, PA-C   1 year ago No-show for appointment   [facility] Danelle Berry, PA-C   1 year ago Chronic pain of multiple sites   [facility] Danelle Berry, [place]   2 years ago Mixed hyperlipidemia   [facility] Danelle Berry, PA-C       Future Appointments             In 3 months Danelle Berry, PA-C [facility], [facility]

## 2022-12-24 NOTE — Telephone Encounter (Signed)
Called pharmacy stated he picked up Dale Kennedy prescription, they never received the one you prescribed but pt still has remaining refills from [facility].

## 2022-12-24 NOTE — Telephone Encounter (Signed)
Pt was given 2 prescriptions as what it seems based on epic

## 2022-12-25 DIAGNOSIS — F1120 Opioid dependence, uncomplicated: Secondary | ICD-10-CM | POA: Diagnosis not present

## 2023-01-05 ENCOUNTER — Ambulatory Visit: Admit: 2023-01-05 | Discharge: 2023-01-06 | Payer: PRIVATE HEALTH INSURANCE

## 2023-01-19 DIAGNOSIS — F1120 Opioid dependence, uncomplicated: Secondary | ICD-10-CM | POA: Diagnosis not present

## 2023-01-21 DIAGNOSIS — F419 Anxiety disorder, unspecified: Secondary | ICD-10-CM | POA: Diagnosis not present

## 2023-01-21 DIAGNOSIS — F1120 Opioid dependence, uncomplicated: Secondary | ICD-10-CM | POA: Diagnosis not present

## 2023-01-27 NOTE — Unmapped (Signed)
[facility] has reached out to this patient via MyChart to onboard them to our Specialty Lite services for their Repatha. Their medication has never been filled.They will now receive proactive outreach from the pharmacy team for refills.    Ryan Hale, PharmD  [facility] Pharmacist

## 2023-01-31 DIAGNOSIS — E785 Hyperlipidemia, unspecified: Principal | ICD-10-CM

## 2023-01-31 DIAGNOSIS — I2510 Atherosclerotic heart disease of native coronary artery without angina pectoris: Principal | ICD-10-CM

## 2023-01-31 MED ORDER — PRALUENT PEN 75 MG/ML SUBCUTANEOUS PEN INJECTOR
SUBCUTANEOUS | 3 refills | 0 days | Status: CP
Start: 2023-01-31 — End: ?

## 2023-01-31 MED ORDER — PRALUENT PEN 150 MG/ML SUBCUTANEOUS PEN INJECTOR
11 refills | 0 days
Start: 2023-01-31 — End: ?

## 2023-02-01 NOTE — Unmapped (Signed)
[facility] Pharm[facility] Refill Coordination Note    Specialty Lite Medication(s) to be Shipped:   Repatha    Other medication(s) to be shipped: No additional medications requested for fill at this time     Ryan Hale, DOB: [birth year]  Phone: [phone] (home)       All above HIPAA information was verified with patient.     Was a Nurse, learning disability used for this call? No    Changes to medications: [name] reports no changes at this time.  Changes to insurance: No      REFERRAL TO PHARMACIST     Referral to the pharmacist: Not needed      [facility]     Shipping address confirmed in Epic.     Delivery Scheduled: Yes, Expected medication delivery date: 02/02/23.     Medication will be delivered via Same Day Courier to the prescription address in Epic WAM.    [facility] Specialty Technician

## 2023-02-02 DIAGNOSIS — F1120 Opioid dependence, uncomplicated: Secondary | ICD-10-CM | POA: Diagnosis not present

## 2023-02-04 DIAGNOSIS — F1120 Opioid dependence, uncomplicated: Secondary | ICD-10-CM | POA: Diagnosis not present

## 2023-02-04 DIAGNOSIS — F419 Anxiety disorder, unspecified: Secondary | ICD-10-CM | POA: Diagnosis not present
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
## 2023-02-08 MED ORDER — CHLORTHALIDONE 25 MG TABLET
ORAL_TABLET | Freq: Every day | ORAL | 0 refills | 30 days | Status: CP
Start: 2023-02-08 — End: ?

## 2023-02-12 ENCOUNTER — Ambulatory Visit
Admit: 2023-02-12 | Discharge: 2023-02-13 | Payer: PRIVATE HEALTH INSURANCE | Attending: Student in an Organized Health Care Education/Training Program | Primary: Student in an Organized Health Care Education/Training Program

## 2023-02-12 DIAGNOSIS — I2510 Atherosclerotic heart disease of native coronary artery without angina pectoris: Principal | ICD-10-CM

## 2023-02-12 DIAGNOSIS — I10 Essential (primary) hypertension: Principal | ICD-10-CM

## 2023-02-12 DIAGNOSIS — F1120 Opioid dependence, uncomplicated: Secondary | ICD-10-CM | POA: Diagnosis not present

## 2023-02-12 MED ORDER — EZETIMIBE 10 MG TABLET
ORAL_TABLET | Freq: Every day | ORAL | 3 refills | 90 days | Status: CP
Start: 2023-02-12 — End: ?

## 2023-02-12 MED ORDER — CHLORTHALIDONE 25 MG TABLET
ORAL_TABLET | Freq: Every day | ORAL | 3 refills | 90 days | Status: CP
Start: 2023-02-12 — End: ?

## 2023-02-12 NOTE — Unmapped (Addendum)
Mr. Holan,    It was nice seeing you!    You are doing well today, so we will not change any of your medications.    I have refilled the chlorthalidone and Zetia for you today. You can pick these up at your pharmacy.    I will plan to see you back in clinic in 6 months.

## 2023-02-12 NOTE — Unmapped (Signed)
DIVISION OF CARDIOLOGY  [employer] [place]    Return Patient Clinic Note-     ASSESSMENT / PLAN    Coronary artery disease: Patient is s/p PCI to LAD & RPL (12/2017) in setting NSTEMI. He has known CTO of LCx that would be a high risk intervention. Long hx of atypical CP/reflux w/ multiple ED visits. Normal nuclear stress 08/2018. Saw Dorian Pod, NP in the Chest Pain clinic in 01/2023 for what he now believes was a response to accidentally taking too much suboxone. He has not experienced his anginal equivalent of shortness of breath since stent placement in 2019. BP and HR are at goal. Of note, patient was unable to tolerate beta blocker due to sexual side effects and fatigue.  - ASA 81 mg daily  - Repatha 140 mg [place] q2 weeks     Hyperlipidemia: Hx of statin intolerance/myalgias. Now on Zetia and Repatha (switched back to this from Praluent due to another insurance change 2024).  Lipid panel 10/2022 shows TC 138, HDL 37, LDL 79.   - Repatha 140 mg [place] q2 weeks  - Zetia 10 mg daily; refilled this today     HTN: BP 124/79 today, at goal. No changes.  - Chlorthalidone 25 mg daily; refilled this today    RTC in 6 months    Cheryll Dessert, MD, MPH  Assistant Professor, Division of Cardiology  [facility]  ______________________________________________________________________    PCP: [facility]  Patient: Ryan Hale  DOB: 03-14-1976    SUBJECTIVE    HPI: Ryan Hale is a 47 y.o. male with a history of CAD, familial HLD, HTN and obesity. Patient presents today for follow up.    Patient was last seen in clinic by me on 01/30/2022. Since that time, he was seen in the ED in 11/2022 for an episode of chest pain that occurred during a heated conversation. Followed up in clinic with Dorian Pod, NP on 01/05/2023. For insurance reasons, has switched back to the Repatha for management of his HLD.    Today, he states that he is feeling well and has no complaints. States that he was feeling new chest sensations, which is what took him to the ED and Chest Pain clinic in October. Today, he states that what he was feeling was likely due to accidentally taking too much suboxone. He has not experienced any symptoms since this time. Denies chest pain, palpitations, swelling, lightheadedness, dizziness, or syncope.  ______________________________________________________________________  Cardiovascular History & Procedures:    Cath / PCI:  LHC 12/10/2017:  Findings:  Patent stent in the LAD  Successful PCI of postero-lateral branch of the RCA with placement of a Xience drug eluting stent     LHC 12/08/2017 (for NSTEMI):  Findings:  Mild lateral wall hypokinesis with preserved LV systolic function  Coronary artery disease including 50% mid-RCA stenosis, 99% rPL stenosis, occluded proximal circumflex, 70% mid-PDA stenosis and 90% distal LAD stenosis  Successful PCI of distal LAD with placement of an Onyx drug eluting stent    CV Surgery:  None    EP Procedures and Devices:  Zio Patch, 09/2019:  Patient had a min HR of 28 bpm, max HR of 168 bpm, and avg HR of 82   bpm. Predominant underlying rhythm was Sinus Rhythm. 1 episode(s) of   AV Block (2nd?? Mobitz II) occurred, lasting a total of 2 secs. Isolated SVEs   were rare (<1.0%, 1492), SVE Couplets were rare (<  1.0%, 4), and no SVE   Triplets were present. Isolated VEs were rare (<1.0%, 5), and no VE   Couplets or VE Triplets were present.     There were 22 patient triggered events/diary entries.  The majority of them corresponded to sinus rhythm with PAC.  A few episodes corresponded to sinus rhythm.     Non-Invasive Evaluation(s):  NM SPECT Stress, 09/02/2018:  Impressions:   - Normal myocardial perfusion study   - No evidence of significant ischemia or scar is noted.   - Post stress: Global systolic function is normal. The ejection fraction   was greater than 65%.   - Attenuation CT scan shows post PCI findings     TTE 03/12/2018:  Limited study to evaluate ventricular function  Left ventricular hypertrophy - mild  Normal left ventricular systolic function, ejection fraction > 55%  ______________________________________________________________________  ______________________________________________________________________   MEDICATIONS:  Reviewed in Epic:  Current Outpatient Medications   Medication Sig Dispense Refill    acetaminophen (TYLENOL) 500 MG tablet Take 1 tablet (500 mg total) by mouth every four (4) hours as needed.      albuterol HFA 90 mcg/actuation inhaler Inhale 2 puffs every six (6) hours as needed.      alirocumab (PRALUENT PEN) 75 mg/mL PnIj Inject 75 mg under the skin every fourteen (14) days. Unknown dose 6 mL 3    aspirin 81 MG chewable tablet Chew 1 tablet (81 mg total) daily. 30 tablet 11    budesonide-formoteroL (SYMBICORT) 160-4.5 mcg/actuation inhaler Inhale 2 puffs.      buprenorphine-naloxone (SUBOXONE) 8-2 mg sublingual film PLACE 2 FILMS EVERY DAY BY SUBLINGUAL ROUTE AS DIRECTED FOR 30 DAYS.      chlorthalidone (HYGROTON) 25 MG tablet Take 1 tablet (25 mg total) by mouth daily. TAKE 1 TABLET(25 MG) BY MOUTH DAILY 30 tablet 0    diclofenac sodium (VOLTAREN) 1 % gel Apply 2 g topically two (2) times a day as needed.      evolocumab (REPATHA SURECLICK) 140 mg/mL PnIj Inject the contents of one pen (140 mg) under the skin every fourteen (14) days. (Patient not taking: Reported on 01/05/2023) 6 mL 6    ezetimibe (ZETIA) 10 mg tablet TAKE 1 TABLET(10 MG) BY MOUTH DAILY 90 tablet 3    fluticasone propionate (FLONASE) 50 mcg/actuation nasal spray 2 sprays into each nostril daily. 16 g 0    loratadine (CLARITIN) 10 mg tablet Take 1 tablet (10 mg total) by mouth daily as needed.      naloxone (NARCAN) 4 mg nasal spray Never needed      nitroglycerin (NITROSTAT) 0.4 MG SL tablet Place 1 tablet (0.4 mg total) under the tongue every five (5) minutes as needed for chest pain. 30 tablet 2    sertraline (ZOLOFT) 25 MG tablet Take 1 tablet (25 mg total) by mouth daily.      tadalafiL (CIALIS) 5 MG tablet Take 1 tablet (5 mg total) by mouth once as needed for erectile dysfunction for up to 1 dose. 30 tablet 1     No current facility-administered medications for this visit.     ______________________________________________________________________   ALLERGIES:  Reviewed in Epic:  Allergies   Allergen Reactions    Ace Inhibitors Swelling    Lisinopril Swelling    Norvasc [Amlodipine] Palpitations     ______________________________________________________________________  ______________________________________________________________________  FAMILY HISTORY:    Family History   Problem Relation Age of Onset    Liver disease Mother  Heart attack Father     Hyperlipidemia Sister     Hypertension Sister      ______________________________________________________________________  ROS:  All other systems reviewed and negative except for what is listed in the HPI  ______________________________________________________________________  OBJECTIVE  BP 124/79  - Pulse 96  - Temp 36.1 ??C (97 ??F) (Temporal)  - Ht 180.3 cm (5' 11)  - Wt (!) 110.7 kg (244 lb)  - SpO2 98%  - BMI 34.03 kg/m??     PHYSICAL EXAMINATION:   GENERAL:  Alert, NAD  EYES: Sclerae clear, EOMI b/l  ENT:  MMM  NECK: Supple  CARDIOVASCULAR:  Normal rate and regular rhythm, normal S1/S2, no murmurs, rubs, or gallops. No peripheral edema.  RESPIRATORY:  Clear to auscultation bilaterally.  No wheezes, crackles, or rhonchi. Normal work of breathing.  ABDOMEN/GI:  Soft, non-tender, non-distended with normoactive bowel sounds.  MSK: No effusions  NEUROLOGIC: Motor exam grossly non-focal. Alert and appropriately conversational  SKIN: No visible rashes  PSYCH:  Normal mental status, mood, and affect.  ______________________________________________________________________      OTHER PERTINENT LABS/STUDIES:  Lab Results   Component Value Date    LDL Calculated 68 12/07/2019    LDL Direct 153.0 01/12/2022    Non-HDL Cholesterol 96 12/07/2019    HDL 37 (L) 12/07/2019    INR 1.23 11/18/2022    PRO-BNP 28.5 11/21/2018    Creatinine 1.02 11/18/2022    Potassium 3.8 11/18/2022    BUN 10 11/18/2022

## 2023-02-16 DIAGNOSIS — F419 Anxiety disorder, unspecified: Secondary | ICD-10-CM | POA: Diagnosis not present

## 2023-02-16 DIAGNOSIS — F1120 Opioid dependence, uncomplicated: Secondary | ICD-10-CM | POA: Diagnosis not present

## 2023-02-19 DIAGNOSIS — F1120 Opioid dependence, uncomplicated: Secondary | ICD-10-CM | POA: Diagnosis not present

## 2023-03-03 MED FILL — REPATHA SURECLICK 140 MG/ML SUBCUTANEOUS PEN INJECTOR: SUBCUTANEOUS | 28 days supply | Qty: 2 | Fill #1

## 2023-03-03 NOTE — Unmapped (Signed)
[facility] Specialty and Home Delivery Pharmacy Refill Coordination Note    Specialty Lite Medication(s) to be Shipped:   Repatha    Other medication(s) to be shipped: No additional medications requested for fill at this time     Ryan Hale, DOB: [birth year]  Phone: [phone] (home)       All above HIPAA information was verified with patient.     Was a Nurse, learning disability used for this call? No    Changes to medications: Ryan Hale reports no changes at this time.  Changes to insurance: No      REFERRAL TO PHARMACIST     Referral to the pharmacist: Not needed      [facility]     Shipping address confirmed in Epic.     Delivery Scheduled: Yes, Expected medication delivery date: 03/03/2023.     Medication will be delivered via Same Day Courier to the prescription address in Epic [place].    Ryan Hale [name] Pharm[facility] Specialty Technician

## 2023-03-12 DIAGNOSIS — F1120 Opioid dependence, uncomplicated: Secondary | ICD-10-CM | POA: Diagnosis not present

## 2023-03-15 DIAGNOSIS — F1120 Opioid dependence, uncomplicated: Secondary | ICD-10-CM | POA: Diagnosis not present

## 2023-04-06 NOTE — Unmapped (Signed)
[facility] Specialty and Home Delivery Pharmacy Refill Coordination Note    Specialty Lite Medication(s) to be Shipped:   Repatha    Other medication(s) to be shipped: No additional medications requested for fill at this time     Ryan Hale, DOB: 04/13/1975  Phone: [phone] (home)       All above HIPAA information was verified with patient.     Was a Nurse, learning disability used for this call? No    Changes to medications: [name] reports no changes at this time.  Changes to insurance: No      REFERRAL TO PHARMACIST     Referral to the pharmacist: Not needed      [facility]     Shipping address confirmed in Epic.     Delivery Scheduled: Yes, Expected medication delivery date: 04/08/23.     Medication will be delivered via Same Day Courier to the prescription address in Epic [place].    [name]   [facility] Specialty and Home Delivery Pharmacy Specialty Technician

## 2023-04-08 MED FILL — REPATHA SURECLICK 140 MG/ML SUBCUTANEOUS PEN INJECTOR: SUBCUTANEOUS | 28 days supply | Qty: 2 | Fill #2

## 2023-04-12 DIAGNOSIS — F419 Anxiety disorder, unspecified: Secondary | ICD-10-CM | POA: Diagnosis not present

## 2023-04-12 DIAGNOSIS — F1120 Opioid dependence, uncomplicated: Secondary | ICD-10-CM | POA: Diagnosis not present

## 2023-04-15 DIAGNOSIS — F1120 Opioid dependence, uncomplicated: Secondary | ICD-10-CM | POA: Diagnosis not present

## 2023-04-19 ENCOUNTER — Ambulatory Visit: Payer: 59 | Admitting: Family Medicine

## 2023-04-24 DIAGNOSIS — F1120 Opioid dependence, uncomplicated: Secondary | ICD-10-CM | POA: Diagnosis not present

## 2023-04-26 ENCOUNTER — Other Ambulatory Visit: Payer: Self-pay | Admitting: Family Medicine

## 2023-04-26 ENCOUNTER — Ambulatory Visit (INDEPENDENT_AMBULATORY_CARE_PROVIDER_SITE_OTHER): Payer: 59 | Admitting: Family Medicine

## 2023-04-26 ENCOUNTER — Encounter: Payer: Self-pay | Admitting: Family Medicine

## 2023-04-26 ENCOUNTER — Encounter: Payer: Self-pay | Admitting: Oncology

## 2023-04-26 VITALS — BP 122/84 | HR 77 | Temp 97.8°F | Resp 18 | Ht 71.0 in | Wt 247.6 lb

## 2023-04-26 DIAGNOSIS — R7303 Prediabetes: Secondary | ICD-10-CM

## 2023-04-26 DIAGNOSIS — D509 Iron deficiency anemia, unspecified: Secondary | ICD-10-CM

## 2023-04-26 DIAGNOSIS — R7989 Other specified abnormal findings of blood chemistry: Secondary | ICD-10-CM | POA: Diagnosis not present

## 2023-04-26 DIAGNOSIS — I2510 Atherosclerotic heart disease of native coronary artery without angina pectoris: Secondary | ICD-10-CM

## 2023-04-26 DIAGNOSIS — Z6834 Body mass index (BMI) 34.0-34.9, adult: Secondary | ICD-10-CM

## 2023-04-26 DIAGNOSIS — Z5181 Encounter for therapeutic drug level monitoring: Secondary | ICD-10-CM | POA: Diagnosis not present

## 2023-04-26 DIAGNOSIS — I10 Essential (primary) hypertension: Secondary | ICD-10-CM | POA: Diagnosis not present

## 2023-04-26 DIAGNOSIS — N529 Male erectile dysfunction, unspecified: Secondary | ICD-10-CM

## 2023-04-26 DIAGNOSIS — E782 Mixed hyperlipidemia: Secondary | ICD-10-CM

## 2023-04-26 DIAGNOSIS — E66811 Obesity, class 1: Secondary | ICD-10-CM

## 2023-04-26 DIAGNOSIS — R002 Palpitations: Secondary | ICD-10-CM | POA: Diagnosis not present

## 2023-04-26 DIAGNOSIS — F419 Anxiety disorder, unspecified: Secondary | ICD-10-CM | POA: Diagnosis not present

## 2023-04-26 MED ORDER — SERTRALINE HCL 25 MG PO TABS: 25.0000 mg | ORAL_TABLET | Freq: Every day | ORAL | 1 refills | Status: DC

## 2023-04-26 MED ORDER — SEMAGLUTIDE-WEIGHT MANAGEMENT 0.25 MG/0.5ML ~~LOC~~ SOAJ: 0.2500 mg | SUBCUTANEOUS | 0 refills | Status: AC

## 2023-04-26 MED ORDER — SEMAGLUTIDE-WEIGHT MANAGEMENT 0.5 MG/0.5ML ~~LOC~~ SOAJ: 0.5000 mg | SUBCUTANEOUS | 0 refills | Status: AC

## 2023-04-26 MED ORDER — EZETIMIBE 10 MG PO TABS: 10.0000 mg | ORAL_TABLET | Freq: Every day | ORAL | 1 refills | Status: DC

## 2023-04-26 NOTE — Telephone Encounter (Signed)
PA submitted.
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
## 2023-04-27 ENCOUNTER — Other Ambulatory Visit: Payer: Self-pay | Admitting: Family Medicine

## 2023-04-27 ENCOUNTER — Encounter: Payer: Self-pay | Admitting: Family Medicine

## 2023-04-27 DIAGNOSIS — Z6834 Body mass index (BMI) 34.0-34.9, adult: Secondary | ICD-10-CM

## 2023-04-27 DIAGNOSIS — I2510 Atherosclerotic heart disease of native coronary artery without angina pectoris: Secondary | ICD-10-CM

## 2023-04-27 LAB — COMPLETE METABOLIC PANEL WITH GFR
AG Ratio: 0.8 (calc) — ABNORMAL LOW (ref 1.0–2.5)
ALT: 26 U/L (ref 9–46)
AST: 21 U/L (ref 10–40)
Albumin: 3.9 g/dL (ref 3.6–5.1)
Alkaline phosphatase (APISO): 130 U/L (ref 36–130)
BUN: 15 mg/dL (ref 7–25)
CO2: 30 mmol/L (ref 20–32)
Calcium: 9.5 mg/dL (ref 8.6–10.3)
Chloride: 99 mmol/L (ref 98–110)
Creat: 1 mg/dL (ref 0.60–1.29)
Globulin: 4.9 g/dL — ABNORMAL HIGH (ref 1.9–3.7)
Glucose, Bld: 86 mg/dL (ref 65–99)
Potassium: 4.2 mmol/L (ref 3.5–5.3)
Sodium: 138 mmol/L (ref 135–146)
Total Bilirubin: 0.3 mg/dL (ref 0.2–1.2)
Total Protein: 8.8 g/dL — ABNORMAL HIGH (ref 6.1–8.1)
eGFR: 94 mL/min/{1.73_m2} (ref >=60)

## 2023-04-27 LAB — CBC WITH DIFFERENTIAL/PLATELET
Absolute Lymphocytes: 1458 {cells}/uL (ref 850–3900)
Absolute Monocytes: 696 {cells}/uL (ref 200–950)
Basophils Absolute: 48 {cells}/uL (ref 0–200)
Basophils Relative: 0.8 %
Eosinophils Absolute: 150 {cells}/uL (ref 15–500)
Eosinophils Relative: 2.5 %
HCT: 43.2 % (ref 38.5–50.0)
Hemoglobin: 13.1 g/dL — ABNORMAL LOW (ref 13.2–17.1)
MCH: 23.1 pg — ABNORMAL LOW (ref 27.0–33.0)
MCHC: 30.3 g/dL — ABNORMAL LOW (ref 32.0–36.0)
MCV: 76.2 fL — ABNORMAL LOW (ref 80.0–100.0)
MPV: 9.8 fL (ref 7.5–12.5)
Monocytes Relative: 11.6 %
Neutro Abs: 3648 {cells}/uL (ref 1500–7800)
Neutrophils Relative %: 60.8 %
Platelets: 471 10*3/uL — ABNORMAL HIGH (ref 140–400)
RBC: 5.67 10*6/uL (ref 4.20–5.80)
RDW: 14.7 % (ref 11.0–15.0)
Total Lymphocyte: 24.3 %
WBC: 6 10*3/uL (ref 3.8–10.8)

## 2023-04-27 LAB — HEMOGLOBIN A1C
Hgb A1c MFr Bld: 6 %{Hb} — ABNORMAL HIGH (ref <5.7)
Mean Plasma Glucose: 126 mg/dL
eAG (mmol/L): 7 mmol/L

## 2023-04-27 LAB — TSH: TSH: 3.19 m[IU]/L (ref 0.40–4.50)

## 2023-04-27 LAB — TESTOSTERONE: Testosterone: 110 ng/dL — ABNORMAL LOW (ref 250–827)

## 2023-04-27 NOTE — Telephone Encounter (Signed)
PA submitted.

## 2023-04-27 NOTE — Telephone Encounter (Signed)
PA started

## 2023-04-28 DIAGNOSIS — F1120 Opioid dependence, uncomplicated: Secondary | ICD-10-CM | POA: Diagnosis not present

## 2023-04-28 DIAGNOSIS — F419 Anxiety disorder, unspecified: Secondary | ICD-10-CM | POA: Diagnosis not present

## 2023-04-29 DIAGNOSIS — R7989 Other specified abnormal findings of blood chemistry: Secondary | ICD-10-CM | POA: Diagnosis not present

## 2023-04-30 ENCOUNTER — Encounter: Payer: Self-pay | Admitting: Family Medicine

## 2023-04-30 LAB — TESTOSTERONE: Testosterone: 154 ng/dL — ABNORMAL LOW (ref 250–827)

## 2023-04-30 NOTE — Addendum Note (Signed)
Addended by: Danelle Berry on: 04/30/2023 10:03 AM   Modules accepted: Orders

## 2023-05-05 NOTE — Unmapped (Signed)
[facility] Specialty and Home Delivery Pharmacy Refill Coordination Note    Specialty Lite Medication(s) to be Shipped:   Repatha    Other medication(s) to be shipped: No additional medications requested for fill at this time     Ryan Hale, DOB: January 28, 1976  Phone: [phone] (home)       All above HIPAA information was verified with patient.     Was a Nurse, learning disability used for this call? No    Changes to medications: Ryan Hale reports no changes at this time.  Changes to insurance: No      REFERRAL TO PHARMACIST     Referral to the pharmacist: Not needed      [facility]     Shipping address confirmed in Epic.     Delivery Scheduled: Yes, Expected medication delivery date: 05/06/23.     Medication will be delivered via Same Day Courier to the prescription address in Epic [place].    Willette Pa   [facility] Specialty and Home Delivery Pharmacy Specialty Technician

## 2023-05-06 MED FILL — REPATHA SURECLICK 140 MG/ML SUBCUTANEOUS PEN INJECTOR: SUBCUTANEOUS | 28 days supply | Qty: 2 | Fill #3

## 2023-05-07 ENCOUNTER — Encounter: Payer: Self-pay | Admitting: Family Medicine

## 2023-05-07 DIAGNOSIS — F419 Anxiety disorder, unspecified: Secondary | ICD-10-CM | POA: Diagnosis not present

## 2023-05-07 DIAGNOSIS — F1120 Opioid dependence, uncomplicated: Secondary | ICD-10-CM | POA: Diagnosis not present

## 2023-05-26 DIAGNOSIS — F1120 Opioid dependence, uncomplicated: Secondary | ICD-10-CM | POA: Diagnosis not present

## 2023-05-28 NOTE — Unmapped (Signed)
 [facility] Specialty and Home Delivery Pharmacy Refill Coordination Note    Specialty Lite Medication(s) to be Shipped:   Repatha    Other medication(s) to be shipped: No additional medications requested for fill at this time     Ryan Hale, DOB: October 22, 1975  Phone: [phone] (home)       All above HIPAA information was verified with patient.     Was a Nurse, learning disability used for this call? No    Changes to medications: [name] reports no changes at this time.  Changes to insurance: No      REFERRAL TO PHARMACIST     Referral to the pharmacist: Not needed      [facility]     Shipping address confirmed in Epic.     Delivery Scheduled: Yes, Expected medication delivery date: 06/01/2023.     Medication will be delivered via Same Day Courier to the prescription address in Epic WAM.    [name]   [facility] Specialty and Home Delivery Pharmacy Specialty Technician

## 2023-06-02 MED FILL — REPATHA SURECLICK 140 MG/ML SUBCUTANEOUS PEN INJECTOR: SUBCUTANEOUS | 28 days supply | Qty: 2 | Fill #4

## 2023-06-09 ENCOUNTER — Ambulatory Visit: Payer: 59 | Admitting: Urology

## 2023-06-09 VITALS — BP 123/80 | HR 81 | Ht 71.0 in | Wt 245.0 lb

## 2023-06-09 DIAGNOSIS — E291 Testicular hypofunction: Secondary | ICD-10-CM

## 2023-06-09 DIAGNOSIS — N529 Male erectile dysfunction, unspecified: Secondary | ICD-10-CM | POA: Diagnosis not present

## 2023-06-09 MED ORDER — CLOMIPHENE CITRATE 50 MG PO TABS: 25.0000 mg | ORAL_TABLET | Freq: Every day | ORAL | 6 refills | Status: AC

## 2023-06-09 MED ORDER — TADALAFIL 5 MG PO TABS: 2.5000 mg | ORAL_TABLET | Freq: Every day | ORAL | 6 refills | Status: AC | PRN

## 2023-06-09 NOTE — Addendum Note (Signed)
 Addended by: Judd Gaudier on: 06/09/2023 03:27 PM   Modules accepted: Orders
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
## 2023-06-09 NOTE — Addendum Note (Signed)
 Addended by: Sondra Come on: 06/09/2023 03:22 PM   Modules accepted: Orders

## 2023-06-14 DIAGNOSIS — F1120 Opioid dependence, uncomplicated: Secondary | ICD-10-CM | POA: Diagnosis not present

## 2023-06-21 ENCOUNTER — Inpatient Hospital Stay: Payer: Self-pay | Attending: Oncology

## 2023-06-24 ENCOUNTER — Ambulatory Visit: Payer: Self-pay | Admitting: Family Medicine

## 2023-06-24 ENCOUNTER — Encounter: Payer: Self-pay | Admitting: Family Medicine

## 2023-06-24 DIAGNOSIS — F1120 Opioid dependence, uncomplicated: Secondary | ICD-10-CM | POA: Diagnosis not present

## 2023-06-25 ENCOUNTER — Encounter: Payer: Self-pay | Admitting: Oncology

## 2023-06-28 ENCOUNTER — Inpatient Hospital Stay: Payer: Self-pay | Admitting: Oncology

## 2023-06-29 NOTE — Unmapped (Signed)
 [facility] Refill Coordination Note    Specialty Lite Medication(s) to be Shipped:   Repatha    Other medication(s) to be shipped: No additional medications requested for fill at this time     Ryan Hale, DOB: December 04, 1975  Phone: [phone] (home)       All above HIPAA information was verified with patient.     Was a Nurse, learning disability used for this call? No    Changes to medications: [name] reports no changes at this time.  Changes to insurance: No      REFERRAL TO PHARMACIST     Referral to the pharmacist: Not needed      [facility]     Shipping address confirmed in Epic.     Cost and Payment: Patient has a copay of $15.00. They are aware and have authorized the pharmacy to charge the credit card on file.    Delivery Scheduled: Yes, Expected medication delivery date: 06/30/2023.     Medication will be delivered via Same Day Courier to the prescription address in Epic WAM.    [name]   [facility] Specialty Technician

## 2023-06-30 DIAGNOSIS — F1120 Opioid dependence, uncomplicated: Secondary | ICD-10-CM | POA: Diagnosis not present

## 2023-06-30 MED FILL — REPATHA SURECLICK 140 MG/ML SUBCUTANEOUS PEN INJECTOR: SUBCUTANEOUS | 28 days supply | Qty: 2 | Fill #5

## 2023-07-05 DIAGNOSIS — F1120 Opioid dependence, uncomplicated: Secondary | ICD-10-CM | POA: Diagnosis not present

## 2023-07-12 ENCOUNTER — Other Ambulatory Visit: Payer: Self-pay

## 2023-07-12 ENCOUNTER — Inpatient Hospital Stay

## 2023-07-12 ENCOUNTER — Other Ambulatory Visit
Admission: RE | Admit: 2023-07-12 | Discharge: 2023-07-12 | Disposition: A | Discharge status: Home / Self Care | Attending: Urology | Admitting: Urology

## 2023-07-12 DIAGNOSIS — R778 Other specified abnormalities of plasma proteins: Secondary | ICD-10-CM | POA: Insufficient documentation

## 2023-07-12 DIAGNOSIS — E291 Testicular hypofunction: Secondary | ICD-10-CM

## 2023-07-12 DIAGNOSIS — N529 Male erectile dysfunction, unspecified: Secondary | ICD-10-CM | POA: Diagnosis not present

## 2023-07-12 DIAGNOSIS — D509 Iron deficiency anemia, unspecified: Secondary | ICD-10-CM | POA: Insufficient documentation

## 2023-07-12 LAB — CBC WITH DIFFERENTIAL (CANCER CENTER ONLY)
Abs Immature Granulocytes: 0.02 10*3/uL (ref 0.00–0.07)
Basophils Absolute: 0.1 10*3/uL (ref 0.0–0.1)
Basophils Relative: 1 %
Eosinophils Absolute: 0.1 10*3/uL (ref 0.0–0.5)
Eosinophils Relative: 2 %
HCT: 44 % (ref 39.0–52.0)
Hemoglobin: 13.3 g/dL (ref 13.0–17.0)
Immature Granulocytes: 0 %
Lymphocytes Relative: 24 %
Lymphs Abs: 1.7 10*3/uL (ref 0.7–4.0)
MCH: 23 pg — ABNORMAL LOW (ref 26.0–34.0)
MCHC: 30.2 g/dL (ref 30.0–36.0)
MCV: 76 fL — ABNORMAL LOW (ref 80.0–100.0)
Monocytes Absolute: 0.6 10*3/uL (ref 0.1–1.0)
Monocytes Relative: 9 %
Neutro Abs: 4.6 10*3/uL (ref 1.7–7.7)
Neutrophils Relative %: 64 %
Platelet Count: 473 10*3/uL — ABNORMAL HIGH (ref 150–400)
RBC: 5.79 MIL/uL (ref 4.22–5.81)
RDW: 17.3 % — ABNORMAL HIGH (ref 11.5–15.5)
WBC Count: 7.2 10*3/uL (ref 4.0–10.5)
nRBC: 0 % (ref 0.0–0.2)

## 2023-07-12 LAB — BASIC METABOLIC PANEL - CANCER CENTER ONLY
Anion gap: 9 (ref 5–15)
BUN: 13 mg/dL (ref 6–20)
CO2: 27 mmol/L (ref 22–32)
Calcium: 9.1 mg/dL (ref 8.9–10.3)
Chloride: 100 mmol/L (ref 98–111)
Creatinine: 1.16 mg/dL (ref 0.61–1.24)
GFR, Estimated: >60 mL/min (ref >60)
Glucose, Bld: 89 mg/dL (ref 70–99)
Potassium: 3.4 mmol/L — ABNORMAL LOW (ref 3.5–5.1)
Sodium: 136 mmol/L (ref 135–145)

## 2023-07-12 LAB — FERRITIN: Ferritin: 43 ng/mL (ref 24–336)

## 2023-07-12 LAB — IRON AND TIBC
Iron: 38 ug/dL — ABNORMAL LOW (ref 45–182)
Saturation Ratios: 10 % — ABNORMAL LOW (ref 17.9–39.5)
TIBC: 367 ug/dL (ref 250–450)
UIBC: 329 ug/dL

## 2023-07-13 LAB — KAPPA/LAMBDA LIGHT CHAINS
Kappa free light chain: 82.7 mg/L — ABNORMAL HIGH (ref 3.3–19.4)
Kappa, lambda light chain ratio: 2.23 — ABNORMAL HIGH (ref 0.26–1.65)
Lambda free light chains: 37.1 mg/L — ABNORMAL HIGH (ref 5.7–26.3)

## 2023-07-13 LAB — IGG, IGA, IGM
IgA: 397 mg/dL — ABNORMAL HIGH (ref 90–386)
IgG (Immunoglobin G), Serum: 3040 mg/dL — ABNORMAL HIGH (ref 603–1613)
IgM (Immunoglobulin M), Srm: 146 mg/dL (ref 20–172)

## 2023-07-13 LAB — TESTOSTERONE: Testosterone: 478 ng/dL (ref 264–916)

## 2023-07-19 ENCOUNTER — Inpatient Hospital Stay: Attending: Oncology | Admitting: Oncology

## 2023-07-19 ENCOUNTER — Encounter: Payer: Self-pay | Admitting: Oncology

## 2023-07-19 VITALS — BP 124/86 | HR 90 | Temp 98.7°F | Resp 19 | Wt 251.8 lb

## 2023-07-19 DIAGNOSIS — Z87891 Personal history of nicotine dependence: Secondary | ICD-10-CM | POA: Diagnosis not present

## 2023-07-19 DIAGNOSIS — I252 Old myocardial infarction: Secondary | ICD-10-CM | POA: Insufficient documentation

## 2023-07-19 DIAGNOSIS — R778 Other specified abnormalities of plasma proteins: Secondary | ICD-10-CM | POA: Diagnosis not present

## 2023-07-19 DIAGNOSIS — R768 Other specified abnormal immunological findings in serum: Secondary | ICD-10-CM | POA: Diagnosis not present

## 2023-07-19 DIAGNOSIS — R779 Abnormality of plasma protein, unspecified: Secondary | ICD-10-CM | POA: Insufficient documentation

## 2023-07-19 DIAGNOSIS — E611 Iron deficiency: Secondary | ICD-10-CM | POA: Insufficient documentation
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
## 2023-07-28 DIAGNOSIS — Z7189 Other specified counseling: Secondary | ICD-10-CM | POA: Diagnosis not present

## 2023-07-28 DIAGNOSIS — F1120 Opioid dependence, uncomplicated: Secondary | ICD-10-CM | POA: Diagnosis not present

## 2023-07-28 DIAGNOSIS — Z659 Problem related to unspecified psychosocial circumstances: Secondary | ICD-10-CM | POA: Diagnosis not present

## 2023-07-28 MED FILL — REPATHA SURECLICK 140 MG/ML SUBCUTANEOUS PEN INJECTOR: SUBCUTANEOUS | 28 days supply | Qty: 2 | Fill #6

## 2023-08-04 ENCOUNTER — Encounter: Payer: Self-pay | Admitting: Urology

## 2023-08-04 ENCOUNTER — Ambulatory Visit: Admitting: Urology

## 2023-08-04 DIAGNOSIS — F1120 Opioid dependence, uncomplicated: Secondary | ICD-10-CM | POA: Diagnosis not present

## 2023-08-17 ENCOUNTER — Ambulatory Visit: Admitting: Urology

## 2023-08-25 DIAGNOSIS — F1120 Opioid dependence, uncomplicated: Secondary | ICD-10-CM | POA: Diagnosis not present

## 2023-08-31 ENCOUNTER — Ambulatory Visit: Admitting: Urology

## 2023-08-31 ENCOUNTER — Encounter: Payer: Self-pay | Admitting: Urology

## 2023-08-31 DIAGNOSIS — F1120 Opioid dependence, uncomplicated: Secondary | ICD-10-CM | POA: Diagnosis not present

## 2023-09-01 MED FILL — REPATHA SURECLICK 140 MG/ML SUBCUTANEOUS PEN INJECTOR: SUBCUTANEOUS | 28 days supply | Qty: 2 | Fill #7

## 2023-09-04 DIAGNOSIS — F1120 Opioid dependence, uncomplicated: Secondary | ICD-10-CM | POA: Diagnosis not present

## 2023-09-23 DIAGNOSIS — F1120 Opioid dependence, uncomplicated: Secondary | ICD-10-CM | POA: Diagnosis not present

## 2023-10-04 DIAGNOSIS — F1120 Opioid dependence, uncomplicated: Secondary | ICD-10-CM | POA: Diagnosis not present

## 2023-10-04 MED FILL — REPATHA SURECLICK 140 MG/ML SUBCUTANEOUS PEN INJECTOR: SUBCUTANEOUS | 28 days supply | Qty: 2 | Fill #8

## 2023-10-07 DIAGNOSIS — F1120 Opioid dependence, uncomplicated: Secondary | ICD-10-CM | POA: Diagnosis not present

## 2023-10-07 DIAGNOSIS — F419 Anxiety disorder, unspecified: Secondary | ICD-10-CM | POA: Diagnosis not present

## 2023-10-21 ENCOUNTER — Other Ambulatory Visit: Payer: Self-pay | Admitting: Family Medicine

## 2023-10-21 DIAGNOSIS — E782 Mixed hyperlipidemia: Secondary | ICD-10-CM

## 2023-10-22 NOTE — Telephone Encounter (Signed)
 Last OV 04/26/23 within protocol. OV needed for additional refills.  Requested Prescriptions  Pending Prescriptions Disp Refills   ezetimibe  (ZETIA ) 10 MG tablet [Pharmacy Med Name: EZETIMIBE  10 MG TABLET] 90 tablet 0    Sig: TAKE 1 TABLET BY MOUTH EVERY DAY     Cardiovascular:  Antilipid - Sterol Transport Inhibitors Failed - 10/22/2023 11:23 AM      Failed - Valid encounter within last 12 months    Recent Outpatient Visits   None            Failed - Lipid Panel in normal range within the last 12 months    Cholesterol, Total  Date Value Ref Range Status  05/31/2015 165 100 - 199 mg/dL Final   Cholesterol  Date Value Ref Range Status  10/15/2022 138 <200 mg/dL Final   LDL Cholesterol (Calc)  Date Value Ref Range Status  10/15/2022 79 mg/dL (calc) Final    Comment:    Reference range: <100 . Desirable range <100 mg/dL for primary prevention;   <70 mg/dL for patients with CHD or diabetic patients  with > or = 2 CHD risk factors. SABRA LDL-C is now calculated using the Martin-Hopkins  calculation, which is a validated novel method providing  better accuracy than the Friedewald equation in the  estimation of LDL-C.  Gladis APPLETHWAITE et al. SANDREA. 7986;689(80): 2061-2068  (http://education.[facility].com/faq/FAQ164)    HDL  Date Value Ref Range Status  10/15/2022 37 (L) > OR = 40 mg/dL Final  97/75/7982 40 >60 mg/dL Final   Triglycerides  Date Value Ref Range Status  10/15/2022 120 <150 mg/dL Final         Passed - AST in normal range and within 360 days    AST  Date Value Ref Range Status  04/26/2023 21 10 - 40 U/L Final   SGOT(AST)  Date Value Ref Range Status  07/22/2013 17 15 - 37 Unit/L Final         Passed - ALT in normal range and within 360 days    ALT  Date Value Ref Range Status  04/26/2023 26 9 - 46 U/L Final   SGPT (ALT)  Date Value Ref Range Status  07/22/2013 51 12 - 78 U/L Final         Passed - Patient is not pregnant

## 2023-10-28 ENCOUNTER — Ambulatory Visit (INDEPENDENT_AMBULATORY_CARE_PROVIDER_SITE_OTHER): Admitting: Nurse Practitioner

## 2023-10-28 ENCOUNTER — Encounter: Payer: Self-pay | Admitting: Nurse Practitioner

## 2023-10-28 ENCOUNTER — Ambulatory Visit: Payer: Self-pay

## 2023-10-28 VITALS — BP 132/84 | HR 98 | Resp 18 | Ht 71.0 in | Wt 251.1 lb

## 2023-10-28 DIAGNOSIS — M109 Gout, unspecified: Secondary | ICD-10-CM | POA: Diagnosis not present

## 2023-10-28 MED ORDER — COLCHICINE 0.6 MG PO TABS: ORAL_TABLET | ORAL | 0 refills | Status: DC

## 2023-10-28 MED ORDER — COLCHICINE 0.6 MG PO TABS
ORAL_TABLET | ORAL | 0 refills | Status: DC
Start: 2023-10-28 — End: 2023-10-28

## 2023-10-28 MED ORDER — PREDNISONE 10 MG (21) PO TBPK: ORAL_TABLET | ORAL | 0 refills | Status: DC

## 2023-10-28 MED FILL — REPATHA SURECLICK 140 MG/ML SUBCUTANEOUS PEN INJECTOR: SUBCUTANEOUS | 28 days supply | Qty: 2 | Fill #9

## 2023-10-28 NOTE — Addendum Note (Signed)
 Addended by: Fayelynn Distel F on: 10/28/2023 01:10 PM   Modules accepted: Orders

## 2023-10-28 NOTE — Telephone Encounter (Signed)
 FYI Only or Action Required?: FYI only for provider.  Patient was last seen in primary care on 04/16/2023.  Called Nurse Triage reporting Foot Pain.  Symptoms began several days ago.  Interventions attempted: OTC medications: Tylenol , aspirin  and Rest, hydration, or home remedies.  Symptoms are: unchanged.  Triage Disposition: See HCP Within 4 Hours (Or PCP Triage)  Patient/caregiver understands and will follow disposition?: Yes  Message from Cleave MATSU sent at 10/28/2023  9:31 AM EDT  pt said he think he have a bad case of gout on his feet. Needs appt asap please follow up.   Reason for Disposition  [1] Looks infected (e.g., spreading redness, pus) AND [2] large red area (> 2 inches or 5 cm)  Answer Assessment - Initial Assessment Questions 1. ONSET: When did the pain start?      2.5 days ago 2. LOCATION: Where is the pain located?      Right foot, great toe to center of foot.  3. PAIN: How bad is the pain?    (Scale 1-10; or mild, moderate, severe)     Moderate 4. WORK OR EXERCISE: Has there been any recent work or exercise that involved this part of the body?      no 5. CAUSE: What do you think is causing the foot pain?     Unsure, maybe gout 6. OTHER SYMPTOMS: Do you have any other symptoms? (e.g., leg pain, rash, fever, numbness)     Redness   History of gout-never treated with medication, took Cherry juice but this did not help.  Has been using Tylenol  and ASA with very minimal effect.  Protocols used: Foot Pain-A-AH

## 2023-10-28 NOTE — Progress Notes (Signed)
 BP 132/84   Pulse 98   Resp 18   Ht 5' 11 (1.803 m)   Wt 251 lb 1.6 oz (113.9 kg)   SpO2 98%   BMI 35.02 kg/m    Subjective:    Patient ID: Dale Kennedy, male    DOB: 1975-07-22, 48 y.o.   MRN: [id number]  HPI: MUTASIM TUCKEY is a 48 y.o. male presenting today with right foot pain, especially the right great toe that began several days ago. Patient reports trying Tylenol , Aspirin , and resting foot but symptoms are unchanged. Patient reports that even a soft object touching his food is painful. He reports he had a gout flare up about eight months ago, drank cherry juice and it relieved sx however this time he reports his foot is more painful and tender to the touch. Patient denies any recent injury.    Right foot: -Endorses swelling, redness, and warmth around R great toe  -Endorses tenderness to the touch  -Reports pain when walking on that foot      04/26/2023    9:00 AM 10/15/2022    8:28 AM 08/27/2021    2:29 PM  Depression screen PHQ 2/9  Decreased Interest 0 0 0  Down, Depressed, Hopeless 0 0 0  PHQ - 2 Score 0 0 0  Altered sleeping 0 0 0  Tired, decreased energy 0 0 1  Change in appetite 0 0 0  Feeling bad or failure about yourself  0 0 0  Trouble concentrating 0 0 0  Moving slowly or fidgety/restless  0 0  Suicidal thoughts 0 0 0  PHQ-9 Score 0 0 1  Difficult doing work/chores Not difficult at all  Not difficult at all    Relevant past medical, surgical, family and social history reviewed and updated as indicated. Interim medical history since our last visit reviewed. Allergies and medications reviewed and updated.  Review of Systems Ten systems reviewed and is negative except as mentioned in HPI      Objective:     BP 132/84   Pulse 98   Resp 18   Ht 5' 11 (1.803 m)   Wt 251 lb 1.6 oz (113.9 kg)   SpO2 98%   BMI 35.02 kg/m    Wt Readings from Last 3 Encounters:  10/28/23 251 lb 1.6 oz (113.9 kg)  07/19/23 251 lb 12.8 oz (114.2 kg)  06/09/23 245 lb  (111.1 kg)    Physical Exam Constitutional:      Appearance: Normal appearance.  Cardiovascular:     Rate and Rhythm: Normal rate and regular rhythm.     Pulses: Normal pulses.  Pulmonary:     Effort: Pulmonary effort is normal.     Breath sounds: Normal breath sounds.  Musculoskeletal:     Cervical back: Normal range of motion and neck supple.     Right foot: Decreased range of motion.  Feet:     Right foot:     Skin integrity: Erythema and warmth present.  Skin:    General: Skin is warm and dry.  Neurological:     Mental Status: He is alert.      Results for orders placed or performed during the hospital encounter of 07/12/23  Testosterone    Collection Time: 07/12/23 10:20 AM  Result Value Ref Range   Testosterone  478 264 - 916 ng/dL  IgG, IgA, IgM   Collection Time: 07/12/23 10:22 AM  Result Value Ref Range   IgG (Immunoglobin G), Serum 3,040 (  H) 603 - 1,613 mg/dL   IgA 602 (H) 90 - 613 mg/dL   IgM (Immunoglobulin M), Srm 146 20 - 172 mg/dL  Kappa/lambda light chains   Collection Time: 07/12/23 10:22 AM  Result Value Ref Range   Kappa free light chain 82.7 (H) 3.3 - 19.4 mg/L   Lambda free light chains 37.1 (H) 5.7 - 26.3 mg/L   Kappa, lambda light chain ratio 2.23 (H) 0.26 - 1.65  Iron  and TIBC([facility])   Collection Time: 07/12/23 10:22 AM  Result Value Ref Range   Iron  38 (L) 45 - 182 ug/dL   TIBC 632 749 - 549 ug/dL   Saturation Ratios 10 (L) 17.9 - 39.5 %   UIBC 329 ug/dL  Ferritin   Collection Time: 07/12/23 10:22 AM  Result Value Ref Range   Ferritin 43 24 - 336 ng/mL  CBC with Differential (Cancer Center Only)   Collection Time: 07/12/23 10:22 AM  Result Value Ref Range   WBC Count 7.2 4.0 - 10.5 K/uL   RBC 5.79 4.22 - 5.81 MIL/uL   Hemoglobin 13.3 13.0 - 17.0 g/dL   HCT 55.9 60.9 - 47.9 %   MCV 76.0 (L) 80.0 - 100.0 fL   MCH 23.0 (L) 26.0 - 34.0 pg   MCHC 30.2 30.0 - 36.0 g/dL   RDW 82.6 (H) 88.4 - 84.4 %   Platelet Count 473 (H) 150  - 400 K/uL   nRBC 0.0 0.0 - 0.2 %   Neutrophils Relative % 64 %   Neutro Abs 4.6 1.7 - 7.7 K/uL   Lymphocytes Relative 24 %   Lymphs Abs 1.7 0.7 - 4.0 K/uL   Monocytes Relative 9 %   Monocytes Absolute 0.6 0.1 - 1.0 K/uL   Eosinophils Relative 2 %   Eosinophils Absolute 0.1 0.0 - 0.5 K/uL   Basophils Relative 1 %   Basophils Absolute 0.1 0.0 - 0.1 K/uL   Immature Granulocytes 0 %   Abs Immature Granulocytes 0.02 0.00 - 0.07 K/uL  Basic Metabolic Panel - Cancer Center Only   Collection Time: 07/12/23 10:22 AM  Result Value Ref Range   Sodium 136 135 - 145 mmol/L   Potassium 3.4 (L) 3.5 - 5.1 mmol/L   Chloride 100 98 - 111 mmol/L   CO2 27 22 - 32 mmol/L   Glucose, Bld 89 70 - 99 mg/dL   BUN 13 6 - 20 mg/dL   Creatinine 8.83 9.38 - 1.24 mg/dL   Calcium  9.1 8.9 - 10.3 mg/dL   GFR, Estimated >39 >39 mL/min   Anion gap 9 5 - 15          Assessment & Plan:   Problem List Items Addressed This Visit   None Visit Diagnoses       Acute gout of right foot, unspecified cause    -  Primary   Begin taking Steroid and Colchine for symptoms.   Relevant Medications   colchicine  0.6 MG tablet   predniSONE  (STERAPRED UNI-PAK 21 TAB) 10 MG (21) TBPK tablet       -Begin taking Prednisone  -Begin taking Colchicine  0.6mg , take 1.2 mg (2 tabs) when you puck up prescription. One hour later take 0.6 mg (1 tab) and then for the next 3 days take 1 tab. -Discussed warm soaks for foot -Discussed Allopurinol for maintenance if continued gout flares          Follow up plan: Return if symptoms worsen or fail to improve.    I have  reviewed this encounter including the documentation in this note and/or discussed this patient with the provider, Aislinn Womack, SNP, I am certifying that I agree with the content of this note as supervising/preceptor nurse practitioner.  Mliss Spray, FNP-C [facility] 10/28/2023, 11:41 AM

## 2023-11-03 DIAGNOSIS — F1120 Opioid dependence, uncomplicated: Secondary | ICD-10-CM | POA: Diagnosis not present

## 2023-11-03 DIAGNOSIS — F419 Anxiety disorder, unspecified: Secondary | ICD-10-CM | POA: Diagnosis not present

## 2023-11-04 DIAGNOSIS — F1120 Opioid dependence, uncomplicated: Secondary | ICD-10-CM | POA: Diagnosis not present

## 2023-11-04 DIAGNOSIS — F419 Anxiety disorder, unspecified: Secondary | ICD-10-CM | POA: Diagnosis not present

## 2023-11-08 DIAGNOSIS — F1120 Opioid dependence, uncomplicated: Secondary | ICD-10-CM | POA: Diagnosis not present

## 2023-11-10 ENCOUNTER — Encounter: Payer: Self-pay | Admitting: Nurse Practitioner

## 2023-11-10 ENCOUNTER — Ambulatory Visit (INDEPENDENT_AMBULATORY_CARE_PROVIDER_SITE_OTHER): Admitting: Nurse Practitioner

## 2023-11-10 DIAGNOSIS — M109 Gout, unspecified: Secondary | ICD-10-CM | POA: Diagnosis not present

## 2023-11-10 MED ORDER — ALLOPURINOL 100 MG PO TABS: 100.0000 mg | ORAL_TABLET | Freq: Every day | ORAL | 1 refills | Status: DC

## 2023-11-10 MED ORDER — COLCHICINE 0.6 MG PO TABS: ORAL_TABLET | ORAL | 1 refills | Status: AC

## 2023-11-10 MED ORDER — PREDNISONE 10 MG (21) PO TBPK: ORAL_TABLET | ORAL | 0 refills | Status: DC

## 2023-11-10 NOTE — Progress Notes (Signed)
 BP 122/80   Pulse 85   Resp 16   Ht 5' 11 (1.803 m)   Wt 252 lb 4.8 oz (114.4 kg)   SpO2 99%   BMI 35.19 kg/m    Subjective:    Patient ID: Dale Kennedy Like, male    DOB: January 06, 1976, 48 y.o.   MRN: [id number]  HPI: Dale Kennedy is a 48 y.o. male  Chief Complaint  Patient presents with   Foot Pain    R great toe, had a flare up on 10/28/23 and its back again    Discussed the use of AI scribe software for clinical note transcription with the patient, who gave verbal consent to proceed.  History of Present Illness Dale Kennedy is a 48 year old male with gout who presents with a flare-up of his right great toe.  Monoarticular joint pain and swelling - Acute onset of pain and swelling in the right great toe - Symptoms began mildly two days ago and became more pronounced yesterday - Pain worsened overnight - No mention of similar symptoms in other joints  Response to prior treatment - Previous flare-up on October 28, 2023, treated with steroid taper and colchicine  - Complete resolution of symptoms with prior treatment ('it actually went all the way away')  Current medication use - Took one colchicine  pill today, approximately one hour prior to the visit - Uncertain if taking the full dose earlier would have been more beneficial - Had only a single pill left from previous prescription - Uncertain about the effectiveness of the single dose in preventing the current flare-up         11/10/2023    8:20 AM 04/26/2023    9:00 AM 10/15/2022    8:28 AM  Depression screen PHQ 2/9  Decreased Interest 0 0 0  Down, Depressed, Hopeless 0 0 0  PHQ - 2 Score 0 0 0  Altered sleeping 0 0 0  Tired, decreased energy 0 0 0  Change in appetite 0 0 0  Feeling bad or failure about yourself  0 0 0  Trouble concentrating 0 0 0  Moving slowly or fidgety/restless 0  0  Suicidal thoughts 0 0 0  PHQ-9 Score 0 0 0  Difficult doing work/chores Not difficult at all Not difficult at all     Relevant  past medical, surgical, family and social history reviewed and updated as indicated. Interim medical history since our last visit reviewed. Allergies and medications reviewed and updated.  Review of Systems  Ten systems reviewed and is negative except as mentioned in HPI      Objective:     BP 122/80   Pulse 85   Resp 16   Ht 5' 11 (1.803 m)   Wt 252 lb 4.8 oz (114.4 kg)   SpO2 99%   BMI 35.19 kg/m    Wt Readings from Last 3 Encounters:  11/10/23 252 lb 4.8 oz (114.4 kg)  10/28/23 251 lb 1.6 oz (113.9 kg)  07/19/23 251 lb 12.8 oz (114.2 kg)    Physical Exam Physical Exam GENERAL: Alert, cooperative, well developed, no acute distress HEENT: Normocephalic, normal oropharynx, moist mucous membranes CHEST: Clear to auscultation bilaterally, No wheezes, rhonchi, or crackles CARDIOVASCULAR: Normal heart rate and rhythm, S1 and S2 normal without murmurs ABDOMEN: Soft, non-tender, non-distended, without organomegaly, Normal bowel sounds EXTREMITIES: No cyanosis or edema NEUROLOGICAL: Cranial nerves grossly intact, Moves all extremities without gross motor or sensory deficit Right foot:  right great  toe redness and swelling   Results for orders placed or performed during the hospital encounter of 07/12/23  Testosterone    Collection Time: 07/12/23 10:20 AM  Result Value Ref Range   Testosterone  478 264 - 916 ng/dL  IgG, IgA, IgM   Collection Time: 07/12/23 10:22 AM  Result Value Ref Range   IgG (Immunoglobin G), Serum 3,040 (H) 603 - 1,613 mg/dL   IgA 602 (H) 90 - 613 mg/dL   IgM (Immunoglobulin M), Srm 146 20 - 172 mg/dL  Kappa/lambda light chains   Collection Time: 07/12/23 10:22 AM  Result Value Ref Range   Kappa free light chain 82.7 (H) 3.3 - 19.4 mg/L   Lambda free light chains 37.1 (H) 5.7 - 26.3 mg/L   Kappa, lambda light chain ratio 2.23 (H) 0.26 - 1.65  Iron  and TIBC([facility])   Collection Time: 07/12/23 10:22 AM  Result Value Ref Range   Iron  38 (L)  45 - 182 ug/dL   TIBC 632 749 - 549 ug/dL   Saturation Ratios 10 (L) 17.9 - 39.5 %   UIBC 329 ug/dL  Ferritin   Collection Time: 07/12/23 10:22 AM  Result Value Ref Range   Ferritin 43 24 - 336 ng/mL  CBC with Differential (Cancer Center Only)   Collection Time: 07/12/23 10:22 AM  Result Value Ref Range   WBC Count 7.2 4.0 - 10.5 K/uL   RBC 5.79 4.22 - 5.81 MIL/uL   Hemoglobin 13.3 13.0 - 17.0 g/dL   HCT 55.9 60.9 - 47.9 %   MCV 76.0 (L) 80.0 - 100.0 fL   MCH 23.0 (L) 26.0 - 34.0 pg   MCHC 30.2 30.0 - 36.0 g/dL   RDW 82.6 (H) 88.4 - 84.4 %   Platelet Count 473 (H) 150 - 400 K/uL   nRBC 0.0 0.0 - 0.2 %   Neutrophils Relative % 64 %   Neutro Abs 4.6 1.7 - 7.7 K/uL   Lymphocytes Relative 24 %   Lymphs Abs 1.7 0.7 - 4.0 K/uL   Monocytes Relative 9 %   Monocytes Absolute 0.6 0.1 - 1.0 K/uL   Eosinophils Relative 2 %   Eosinophils Absolute 0.1 0.0 - 0.5 K/uL   Basophils Relative 1 %   Basophils Absolute 0.1 0.0 - 0.1 K/uL   Immature Granulocytes 0 %   Abs Immature Granulocytes 0.02 0.00 - 0.07 K/uL  Basic Metabolic Panel - Cancer Center Only   Collection Time: 07/12/23 10:22 AM  Result Value Ref Range   Sodium 136 135 - 145 mmol/L   Potassium 3.4 (L) 3.5 - 5.1 mmol/L   Chloride 100 98 - 111 mmol/L   CO2 27 22 - 32 mmol/L   Glucose, Bld 89 70 - 99 mg/dL   BUN 13 6 - 20 mg/dL   Creatinine 8.83 9.38 - 1.24 mg/dL   Calcium  9.1 8.9 - 10.3 mg/dL   GFR, Estimated >39 >39 mL/min   Anion gap 9 5 - 15          Assessment & Plan:   Problem List Items Addressed This Visit   None Visit Diagnoses       Acute gout of right foot, unspecified cause       Begin taking Steroid and Colchine for symptoms.   Relevant Medications   predniSONE  (STERAPRED UNI-PAK 21 TAB) 10 MG (21) TBPK tablet   colchicine  0.6 MG tablet   allopurinol  (ZYLOPRIM ) 100 MG tablet        Assessment and Plan Assessment &  Plan Gout flare, right great toe Recurrent gout flare in the right great toe,  initially treated with a steroid taper and colchicine , which resolved the symptoms. The current flare began two days ago with mild symptoms, worsening overnight. The plan is to manage the acute flare and prevent future episodes. Allopurinol  was discussed as a preventive measure but should not be started during an active flare. Colchicine  was taken today, but he was unsure if it was sufficient. Emphasized the importance of dietary management as allopurinol  will not completely prevent flares. - Prescribe colchicine  with a refill for future flare-ups. - Prescribe a steroid taper for the current flare. - Prescribe allopurinol  for long-term management, to be started after the flare resolves. - Advise dietary management as allopurinol  will not completely prevent flares. - Instruct not to start allopurinol  during an active flare but to continue it if a flare occurs while already on the medication.        Follow up plan: Return if symptoms worsen or fail to improve.

## 2023-11-19 MED FILL — REPATHA SURECLICK 140 MG/ML SUBCUTANEOUS PEN INJECTOR: SUBCUTANEOUS | 28 days supply | Qty: 2 | Fill #10

## 2023-12-02 DIAGNOSIS — F1120 Opioid dependence, uncomplicated: Secondary | ICD-10-CM | POA: Diagnosis not present

## 2023-12-07 DIAGNOSIS — J40 Bronchitis, not specified as acute or chronic: Secondary | ICD-10-CM | POA: Diagnosis not present

## 2023-12-10 DIAGNOSIS — F1120 Opioid dependence, uncomplicated: Secondary | ICD-10-CM | POA: Diagnosis not present

## 2023-12-24 DIAGNOSIS — I2510 Atherosclerotic heart disease of native coronary artery without angina pectoris: Principal | ICD-10-CM

## 2023-12-24 MED ORDER — REPATHA SURECLICK 140 MG/ML SUBCUTANEOUS PEN INJECTOR
SUBCUTANEOUS | 6 refills | 84.00000 days
Start: 2023-12-24 — End: ?

## 2023-12-26 MED ORDER — PRALUENT PEN 150 MG/ML SUBCUTANEOUS PEN INJECTOR
11 refills | 0.00000 days
Start: 2023-12-26 — End: ?

## 2023-12-27 MED ORDER — REPATHA SURECLICK 140 MG/ML SUBCUTANEOUS PEN INJECTOR
SUBCUTANEOUS | 3 refills | 84.00000 days | Status: CP
Start: 2023-12-27 — End: ?
  Filled 2023-12-28: qty 2, 28d supply, fill #0

## 2023-12-27 MED ORDER — PRALUENT PEN 150 MG/ML SUBCUTANEOUS PEN INJECTOR
SUBCUTANEOUS | 11 refills | 0.00000 days | Status: CP
Start: 2023-12-27 — End: 2023-12-27

## 2023-12-27 NOTE — Unmapped (Signed)
 Called patient to verify insurance. Patient insurance possibly out of network with [facility]

## 2023-12-27 NOTE — Unmapped (Signed)
 Pt says he was getting Repatha  from [facility] and then it takes  too long and didn't come.  [facility] needed a new credit card for pt.  I have called [facility] and cancelled the Praluent  order.

## 2023-12-27 NOTE — Unmapped (Signed)
 Pt will call [facility] today to authorize $15.00 copay on REPATHA .  He needs a new PA October, and [facility] said they would complete this for me.  With insurance changes and he being [facility], pharmacy tech said he still should be able to get med through them.  [facility] will only refill one month at a time of REPATHA .

## 2023-12-30 DIAGNOSIS — F419 Anxiety disorder, unspecified: Secondary | ICD-10-CM | POA: Diagnosis not present

## 2023-12-30 DIAGNOSIS — F1120 Opioid dependence, uncomplicated: Secondary | ICD-10-CM | POA: Diagnosis not present

## 2024-01-04 DIAGNOSIS — F419 Anxiety disorder, unspecified: Secondary | ICD-10-CM | POA: Diagnosis not present

## 2024-01-04 DIAGNOSIS — F1120 Opioid dependence, uncomplicated: Secondary | ICD-10-CM | POA: Diagnosis not present

## 2024-01-10 DIAGNOSIS — I2510 Atherosclerotic heart disease of native coronary artery without angina pectoris: Principal | ICD-10-CM

## 2024-01-10 DIAGNOSIS — F1120 Opioid dependence, uncomplicated: Secondary | ICD-10-CM | POA: Diagnosis not present

## 2024-01-14 ENCOUNTER — Other Ambulatory Visit: Payer: Self-pay | Admitting: *Deleted

## 2024-01-14 DIAGNOSIS — D509 Iron deficiency anemia, unspecified: Secondary | ICD-10-CM

## 2024-01-14 DIAGNOSIS — R778 Other specified abnormalities of plasma proteins: Secondary | ICD-10-CM

## 2024-01-17 ENCOUNTER — Inpatient Hospital Stay: Attending: Oncology

## 2024-01-17 DIAGNOSIS — Z7902 Long term (current) use of antithrombotics/antiplatelets: Secondary | ICD-10-CM | POA: Insufficient documentation

## 2024-01-17 DIAGNOSIS — E611 Iron deficiency: Secondary | ICD-10-CM | POA: Insufficient documentation

## 2024-01-17 DIAGNOSIS — I2510 Atherosclerotic heart disease of native coronary artery without angina pectoris: Secondary | ICD-10-CM | POA: Insufficient documentation

## 2024-01-17 DIAGNOSIS — I4891 Unspecified atrial fibrillation: Secondary | ICD-10-CM | POA: Insufficient documentation

## 2024-01-17 DIAGNOSIS — E785 Hyperlipidemia, unspecified: Secondary | ICD-10-CM | POA: Insufficient documentation

## 2024-01-17 DIAGNOSIS — I110 Hypertensive heart disease with heart failure: Secondary | ICD-10-CM | POA: Insufficient documentation

## 2024-01-17 DIAGNOSIS — I252 Old myocardial infarction: Secondary | ICD-10-CM | POA: Insufficient documentation

## 2024-01-17 DIAGNOSIS — Z79899 Other long term (current) drug therapy: Secondary | ICD-10-CM | POA: Insufficient documentation

## 2024-01-21 ENCOUNTER — Telehealth: Payer: Self-pay | Admitting: Oncology

## 2024-01-21 NOTE — Unmapped (Signed)
 Denial forwarded to dr. Olene so we may initiate an appeal for his Repatha 

## 2024-01-21 NOTE — Telephone Encounter (Signed)
 Pt missed labs prior and has MD appt on Monday 10/20. Per RN r/s appts.     I called and spoke with pt and confirmed that we can do labs on 10/20 and move the MD appt 1 week out.  Pt agreed and confirmed new dates/times

## 2024-01-24 ENCOUNTER — Inpatient Hospital Stay: Admitting: Oncology

## 2024-01-24 ENCOUNTER — Inpatient Hospital Stay

## 2024-01-24 DIAGNOSIS — Z7902 Long term (current) use of antithrombotics/antiplatelets: Secondary | ICD-10-CM | POA: Diagnosis not present

## 2024-01-24 DIAGNOSIS — Z79899 Other long term (current) drug therapy: Secondary | ICD-10-CM | POA: Diagnosis not present

## 2024-01-24 DIAGNOSIS — N529 Male erectile dysfunction, unspecified: Secondary | ICD-10-CM

## 2024-01-24 DIAGNOSIS — I4891 Unspecified atrial fibrillation: Secondary | ICD-10-CM | POA: Diagnosis not present

## 2024-01-24 DIAGNOSIS — E291 Testicular hypofunction: Secondary | ICD-10-CM

## 2024-01-24 DIAGNOSIS — E611 Iron deficiency: Secondary | ICD-10-CM | POA: Diagnosis not present

## 2024-01-24 DIAGNOSIS — I2510 Atherosclerotic heart disease of native coronary artery without angina pectoris: Secondary | ICD-10-CM | POA: Diagnosis not present

## 2024-01-24 DIAGNOSIS — I110 Hypertensive heart disease with heart failure: Secondary | ICD-10-CM | POA: Diagnosis not present

## 2024-01-24 DIAGNOSIS — E785 Hyperlipidemia, unspecified: Secondary | ICD-10-CM | POA: Diagnosis not present

## 2024-01-24 DIAGNOSIS — I252 Old myocardial infarction: Secondary | ICD-10-CM | POA: Diagnosis not present

## 2024-01-24 DIAGNOSIS — D509 Iron deficiency anemia, unspecified: Secondary | ICD-10-CM

## 2024-01-24 DIAGNOSIS — R778 Other specified abnormalities of plasma proteins: Secondary | ICD-10-CM

## 2024-01-24 LAB — BASIC METABOLIC PANEL - CANCER CENTER ONLY
Anion gap: 7 (ref 5–15)
BUN: 18 mg/dL (ref 6–20)
CO2: 25 mmol/L (ref 22–32)
Calcium: 8.6 mg/dL — ABNORMAL LOW (ref 8.9–10.3)
Chloride: 102 mmol/L (ref 98–111)
Creatinine: 1.12 mg/dL (ref 0.61–1.24)
GFR, Estimated: >60 mL/min (ref >60)
Glucose, Bld: 92 mg/dL (ref 70–99)
Potassium: 3.7 mmol/L (ref 3.5–5.1)
Sodium: 134 mmol/L — ABNORMAL LOW (ref 135–145)

## 2024-01-24 LAB — CBC WITH DIFFERENTIAL/PLATELET
Abs Immature Granulocytes: 0.03 K/uL (ref 0.00–0.07)
Basophils Absolute: 0.1 K/uL (ref 0.0–0.1)
Basophils Relative: 1 %
Eosinophils Absolute: 0.1 K/uL (ref 0.0–0.5)
Eosinophils Relative: 2 %
HCT: 42.2 % (ref 39.0–52.0)
Hemoglobin: 13 g/dL (ref 13.0–17.0)
Immature Granulocytes: 0 %
Lymphocytes Relative: 27 %
Lymphs Abs: 1.8 K/uL (ref 0.7–4.0)
MCH: 22.6 pg — ABNORMAL LOW (ref 26.0–34.0)
MCHC: 30.8 g/dL (ref 30.0–36.0)
MCV: 73.5 fL — ABNORMAL LOW (ref 80.0–100.0)
Monocytes Absolute: 0.7 K/uL (ref 0.1–1.0)
Monocytes Relative: 11 %
Neutro Abs: 3.9 K/uL (ref 1.7–7.7)
Neutrophils Relative %: 59 %
Platelets: 439 K/uL — ABNORMAL HIGH (ref 150–400)
RBC: 5.74 MIL/uL (ref 4.22–5.81)
RDW: 18.4 % — ABNORMAL HIGH (ref 11.5–15.5)
WBC: 6.7 K/uL (ref 4.0–10.5)
nRBC: 0 % (ref 0.0–0.2)

## 2024-01-24 LAB — IRON AND TIBC
Iron: 28 ug/dL — ABNORMAL LOW (ref 45–182)
Saturation Ratios: 9 % — ABNORMAL LOW (ref 17.9–39.5)
TIBC: 326 ug/dL (ref 250–450)
UIBC: 298 ug/dL

## 2024-01-24 LAB — FERRITIN: Ferritin: 38 ng/mL (ref 24–336)

## 2024-01-25 LAB — KAPPA/LAMBDA LIGHT CHAINS
Kappa free light chain: 68.1 mg/L — ABNORMAL HIGH (ref 3.3–19.4)
Kappa, lambda light chain ratio: 1.91 — ABNORMAL HIGH (ref 0.26–1.65)
Lambda free light chains: 35.6 mg/L — ABNORMAL HIGH (ref 5.7–26.3)

## 2024-01-25 LAB — TESTOSTERONE: Testosterone: 342 ng/dL (ref 264–916)

## 2024-01-26 LAB — IGG, IGA, IGM
IgA: 354 mg/dL (ref 90–386)
IgG (Immunoglobin G), Serum: 2301 mg/dL — ABNORMAL HIGH (ref 603–1613)
IgM (Immunoglobulin M), Srm: 130 mg/dL (ref 20–172)

## 2024-01-27 DIAGNOSIS — F1120 Opioid dependence, uncomplicated: Secondary | ICD-10-CM | POA: Diagnosis not present

## 2024-01-28 DIAGNOSIS — I2510 Atherosclerotic heart disease of native coronary artery without angina pectoris: Principal | ICD-10-CM

## 2024-01-28 MED ORDER — REPATHA SURECLICK 140 MG/ML SUBCUTANEOUS PEN INJECTOR
SUBCUTANEOUS | 3 refills | 84.00000 days | Status: CP
Start: 2024-01-28 — End: 2024-01-28

## 2024-01-28 NOTE — Progress Notes (Signed)
 I have complete PA for Repatha .  He can sign up for the copay card for REPATHA  commercial insurance at [phone] once PA has been completed.  He will be charged only $15.00 copay.  He does not seem to be enrolled per [facility] that I called today.  I have faxed supporting documentation to [phone] Ph [phone].

## 2024-01-28 NOTE — Progress Notes (Unsigned)
 DIVISION OF CARDIOLOGY  [facility] [employer] [facility] , [facility]    Return Patient Clinic Note- [place]    ASSESSMENT / PLAN    Coronary artery disease: Patient is s/p PCI to LAD & RPL (12/2017) in setting NSTEMI. He has known CTO of LCx that would be a high risk intervention. Long hx of atypical CP/reflux w/ multiple ED visits. Normal nuclear stress 08/2018. Saw Mallie Capes, NP in the Chest Pain clinic in 01/2023 for what he now believes was a response to accidentally taking too much suboxone. He has not experienced his anginal equivalent of shortness of breath since stent placement in 2019. BP and HR are at goal. Of note, patient was unable to tolerate beta blocker due to sexual side effects and fatigue.  - ASA 81 mg daily  - Repatha  140 mg [place] q2 weeks     Hyperlipidemia: Hx of statin intolerance/myalgias. Now on Zetia  and Repatha  (switched back to this from Praluent  due to another insurance change 2024).  Lipid panel 10/2022 shows TC 138, HDL 37, LDL 79.   - Repatha  140 mg [place] q2 weeks  - Zetia  10 mg daily; refilled this today     HTN: BP 124/79 today, at goal. No changes.  - Chlorthalidone  25 mg daily; refilled this today    RTC in 6 months    Altamese Presume, MD, MPH  Assistant Professor, Division of Cardiology  [facility]  ______________________________________________________________________    PCP: [facility]  Patient: Ryan Hale  DOB: 1976-02-18    SUBJECTIVE    HPI: Ryan Hale is a 48 y.o. male with a history of CAD, familial HLD, HTN and obesity. Patient presents today for follow up.    Patient was last seen in clinic by me on 02/12/2023. Since that time, he states that***         was seen in the ED in 11/2022 for an episode of chest pain that occurred during a heated conversation. Followed up in clinic with Mallie Capes, NP on 01/05/2023. For insurance reasons, has switched back to the Repatha  for management of his HLD.    Today, he states that he is feeling well and has no complaints. States that he was feeling new chest sensations, which is what took him to the ED and Chest Pain clinic in October. Today, he states that what he was feeling was likely due to accidentally taking too much suboxone. He has not experienced any symptoms since this time. Denies chest pain, palpitations, swelling, lightheadedness, dizziness, or syncope.  ______________________________________________________________________  Cardiovascular History & Procedures:    Cath / PCI:  LHC 12/10/2017:  Findings:  Patent stent in the LAD  Successful PCI of postero-lateral branch of the RCA with placement of a Xience drug eluting stent     LHC 12/08/2017 (for NSTEMI):  Findings:  Mild lateral wall hypokinesis with preserved LV systolic function  Coronary artery disease including 50% mid-RCA stenosis, 99% rPL stenosis, occluded proximal circumflex, 70% mid-PDA stenosis and 90% distal LAD stenosis  Successful PCI of distal LAD with placement of an Onyx drug eluting stent    CV Surgery:  None    EP Procedures and Devices:  Zio Patch, 09/2019:  Patient had a min HR of 28 bpm, max HR of 168 bpm, and avg HR of 82   bpm. Predominant underlying rhythm was Sinus Rhythm. 1 episode(s) of   AV Block (2nd?? Mobitz II) occurred, lasting a total of 2 secs. Isolated SVEs  were rare (<1.0%, 1492), SVE Couplets were rare (<1.0%, 4), and no SVE   Triplets were present. Isolated VEs were rare (<1.0%, 5), and no VE   Couplets or VE Triplets were present.     There were 22 patient triggered events/diary entries.  The majority of them corresponded to sinus rhythm with PAC.  A few episodes corresponded to sinus rhythm.     Non-Invasive Evaluation(s):  NM SPECT Stress, 09/02/2018:  Impressions:   - Normal myocardial perfusion study   - No evidence of significant ischemia or scar is noted.   - Post stress: Global systolic function is normal. The ejection fraction   was greater than 65%.   - Attenuation CT scan shows post PCI findings TTE 03/12/2018:  Limited study to evaluate ventricular function  Left ventricular hypertrophy - mild  Normal left ventricular systolic function, ejection fraction > 55%  ______________________________________________________________________  ______________________________________________________________________   MEDICATIONS:  Reviewed in Epic:  Current Outpatient Medications   Medication Sig Dispense Refill    acetaminophen  (TYLENOL ) 500 MG tablet Take 1 tablet (500 mg total) by mouth every four (4) hours as needed.      albuterol  HFA 90 mcg/actuation inhaler Inhale 2 puffs every six (6) hours as needed.      aspirin  81 MG chewable tablet Chew 1 tablet (81 mg total) daily. 30 tablet 11    budesonide -formoteroL  (SYMBICORT ) 160-4.5 mcg/actuation inhaler Inhale 2 puffs.      buprenorphine-naloxone (SUBOXONE) 8-2 mg sublingual film PLACE 2 FILMS EVERY DAY BY SUBLINGUAL ROUTE AS DIRECTED FOR 30 DAYS.      chlorthalidone  (HYGROTON ) 25 MG tablet Take 1 tablet (25 mg total) by mouth daily. TAKE 1 TABLET(25 MG) BY MOUTH DAILY 90 tablet 3    diclofenac sodium (VOLTAREN) 1 % gel Apply 2 g topically two (2) times a day as needed.      evolocumab  (REPATHA  SURECLICK) 140 mg/mL PnIj Inject the contents of one pen (140 mg) under the skin every fourteen (14) days. 6 mL 3    ezetimibe  (ZETIA ) 10 mg tablet Take 1 tablet (10 mg total) by mouth daily. TAKE 1 TABLET(10 MG) BY MOUTH DAILY 90 tablet 3    fluticasone  propionate (FLONASE ) 50 mcg/actuation nasal spray 2 sprays into each nostril daily. 16 g 0    loratadine (CLARITIN) 10 mg tablet Take 1 tablet (10 mg total) by mouth daily as needed.      naloxone (NARCAN) 4 mg nasal spray Never needed      nitroglycerin  (NITROSTAT ) 0.4 MG SL tablet Place 1 tablet (0.4 mg total) under the tongue every five (5) minutes as needed for chest pain. 30 tablet 2    sertraline  (ZOLOFT ) 25 MG tablet Take 1 tablet (25 mg total) by mouth daily.      tadalafiL  (CIALIS ) 5 MG tablet Take 1 tablet (5 mg total) by mouth once as needed for erectile dysfunction for up to 1 dose. 30 tablet 1     No current facility-administered medications for this visit.     ______________________________________________________________________   ALLERGIES:  Reviewed in Epic:  Allergies   Allergen Reactions    Ace Inhibitors Swelling    Lisinopril Swelling    Norvasc [Amlodipine] Palpitations     ______________________________________________________________________  ______________________________________________________________________  FAMILY HISTORY:    Family History   Problem Relation Age of Onset    Liver disease Mother     Heart attack Father     Hyperlipidemia Sister     Hypertension Sister  ______________________________________________________________________  ROS:  All other systems reviewed and negative except for what is listed in the HPI  ______________________________________________________________________  OBJECTIVE  There were no vitals taken for this visit.    PHYSICAL EXAMINATION:   GENERAL:  Alert, NAD  EYES: Sclerae clear, EOMI b/l  ENT:  MMM  NECK: Supple  CARDIOVASCULAR:  Normal rate and regular rhythm, normal S1/S2, no murmurs, rubs, or gallops. No peripheral edema.  RESPIRATORY:  Clear to auscultation bilaterally.  No wheezes, crackles, or rhonchi. Normal work of breathing.  ABDOMEN/GI:  Soft, non-tender, non-distended with normoactive bowel sounds.  MSK: No effusions  NEUROLOGIC: Motor exam grossly non-focal. Alert and appropriately conversational  SKIN: No visible rashes  PSYCH:  Normal mental status, mood, and affect.  ______________________________________________________________________      OTHER PERTINENT LABS/STUDIES:  Lab Results   Component Value Date    LDL Direct 153.0 01/12/2022    Cholesterol, LDL, Calculated 68 12/07/2019    Cholesterol, Non-HDL, Calculated 96 12/07/2019    Cholesterol, HDL 37 (L) 12/07/2019    INR 1.23 11/18/2022    PRO-BNP 28.5 11/21/2018    Creatinine 1.02 11/18/2022 Potassium 3.8 11/18/2022    BUN 10 11/18/2022

## 2024-01-29 ENCOUNTER — Emergency Department
Admit: 2024-01-29 | Discharge: 2024-01-29 | Disposition: A | Discharge status: Home / Self Care | Payer: PRIVATE HEALTH INSURANCE

## 2024-01-29 DIAGNOSIS — S46012A Strain of muscle(s) and tendon(s) of the rotator cuff of left shoulder, initial encounter: Principal | ICD-10-CM

## 2024-01-29 DIAGNOSIS — M7522 Bicipital tendinitis, left shoulder: Principal | ICD-10-CM

## 2024-01-29 DIAGNOSIS — I2510 Atherosclerotic heart disease of native coronary artery without angina pectoris: Secondary | ICD-10-CM | POA: Diagnosis not present

## 2024-01-29 DIAGNOSIS — S46912A Strain of unspecified muscle, fascia and tendon at shoulder and upper arm level, left arm, initial encounter: Secondary | ICD-10-CM | POA: Diagnosis not present

## 2024-01-29 DIAGNOSIS — X58XXXA Exposure to other specified factors, initial encounter: Secondary | ICD-10-CM | POA: Diagnosis not present

## 2024-01-29 DIAGNOSIS — Z888 Allergy status to other drugs, medicaments and biological substances status: Secondary | ICD-10-CM | POA: Diagnosis not present

## 2024-01-29 DIAGNOSIS — M25512 Pain in left shoulder: Secondary | ICD-10-CM | POA: Diagnosis not present

## 2024-01-29 DIAGNOSIS — J45909 Unspecified asthma, uncomplicated: Secondary | ICD-10-CM | POA: Diagnosis not present

## 2024-01-29 DIAGNOSIS — E785 Hyperlipidemia, unspecified: Secondary | ICD-10-CM | POA: Diagnosis not present

## 2024-01-29 DIAGNOSIS — G4733 Obstructive sleep apnea (adult) (pediatric): Secondary | ICD-10-CM | POA: Diagnosis not present

## 2024-01-29 DIAGNOSIS — I4891 Unspecified atrial fibrillation: Secondary | ICD-10-CM | POA: Diagnosis not present

## 2024-01-29 DIAGNOSIS — Z79899 Other long term (current) drug therapy: Secondary | ICD-10-CM | POA: Diagnosis not present

## 2024-01-29 DIAGNOSIS — Z87891 Personal history of nicotine dependence: Secondary | ICD-10-CM | POA: Diagnosis not present

## 2024-01-29 DIAGNOSIS — Z7982 Long term (current) use of aspirin: Secondary | ICD-10-CM | POA: Diagnosis not present

## 2024-01-29 DIAGNOSIS — I10 Essential (primary) hypertension: Secondary | ICD-10-CM | POA: Diagnosis not present

## 2024-01-29 MED ORDER — METHYLPREDNISOLONE 4 MG TABLETS IN A DOSE PACK
ORAL | 0 refills | 0.00000 days | Status: CP
Start: 2024-01-29 — End: ?

## 2024-01-29 MED ADMIN — triamcinolone acetonide (KENALOG-40) injection 60 mg: 60 mg | INTRAMUSCULAR | @ 15:00:00 | Stop: 2024-01-29

## 2024-01-29 MED ADMIN — ketorolac (TORADOL) injection 30 mg: 30 mg | INTRAMUSCULAR | @ 15:00:00 | Stop: 2024-01-29
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
## 2024-01-29 NOTE — ED Triage Note (Addendum)
 Pt ambulatory to triage with C/O left shoulder pain for 3 months, pt states he had stents placed in 2020. Denies chest pain and denies injury.

## 2024-01-31 ENCOUNTER — Inpatient Hospital Stay: Admitting: Oncology

## 2024-01-31 ENCOUNTER — Other Ambulatory Visit: Payer: Self-pay | Admitting: Family Medicine

## 2024-01-31 ENCOUNTER — Encounter: Payer: Self-pay | Admitting: Oncology

## 2024-01-31 VITALS — BP 134/80 | HR 78 | Temp 98.0°F | Resp 18 | Ht 71.0 in | Wt 258.0 lb

## 2024-01-31 DIAGNOSIS — I2510 Atherosclerotic heart disease of native coronary artery without angina pectoris: Principal | ICD-10-CM

## 2024-01-31 DIAGNOSIS — D509 Iron deficiency anemia, unspecified: Secondary | ICD-10-CM | POA: Diagnosis not present

## 2024-01-31 DIAGNOSIS — Z7902 Long term (current) use of antithrombotics/antiplatelets: Secondary | ICD-10-CM | POA: Diagnosis not present

## 2024-01-31 DIAGNOSIS — E785 Hyperlipidemia, unspecified: Secondary | ICD-10-CM | POA: Diagnosis not present

## 2024-01-31 DIAGNOSIS — I110 Hypertensive heart disease with heart failure: Secondary | ICD-10-CM | POA: Diagnosis not present

## 2024-01-31 DIAGNOSIS — R778 Other specified abnormalities of plasma proteins: Secondary | ICD-10-CM | POA: Diagnosis not present

## 2024-01-31 DIAGNOSIS — Z79899 Other long term (current) drug therapy: Secondary | ICD-10-CM | POA: Diagnosis not present

## 2024-01-31 DIAGNOSIS — I4891 Unspecified atrial fibrillation: Secondary | ICD-10-CM | POA: Diagnosis not present

## 2024-01-31 DIAGNOSIS — E611 Iron deficiency: Secondary | ICD-10-CM | POA: Diagnosis not present

## 2024-01-31 DIAGNOSIS — I252 Old myocardial infarction: Secondary | ICD-10-CM | POA: Diagnosis not present

## 2024-01-31 DIAGNOSIS — F419 Anxiety disorder, unspecified: Secondary | ICD-10-CM

## 2024-01-31 MED ORDER — REPATHA SURECLICK 140 MG/ML SUBCUTANEOUS PEN INJECTOR
SUBCUTANEOUS | 3 refills | 84.00000 days | Status: CP
Start: 2024-01-31 — End: ?
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
## 2024-01-31 NOTE — Telephone Encounter (Addendum)
 PA was completed for Repatha , but in order for it to be approved, pt needs current lab work done.  Must submit LDL C within the last 6 months.  I will resubmit PA with LDL C results to [phone], ph:  [phone].  Pt called and will have LDL C drawn this week.

## 2024-01-31 NOTE — Progress Notes (Signed)
 Patient would like to discuss his most recent labs.

## 2024-02-01 ENCOUNTER — Ambulatory Visit: Admit: 2024-02-01 | Discharge: 2024-02-02 | Payer: PRIVATE HEALTH INSURANCE

## 2024-02-01 LAB — LDL CHOLESTEROL, DIRECT: LDL CHOLESTEROL DIRECT: 86 mg/dL

## 2024-02-01 NOTE — Telephone Encounter (Signed)
 PA is requiring a recent LDL C, his last LDL C was a year ago.  Pt aware to come to lab and have LDL C drawn, then I can resubmit PA.

## 2024-02-01 NOTE — Telephone Encounter (Signed)
 Requested Prescriptions  Pending Prescriptions Disp Refills   sertraline  (ZOLOFT ) 25 MG tablet [Pharmacy Med Name: SERTRALINE  HCL 25 MG TABLET] 90 tablet 0    Sig: Take 1 tablet (25 mg total) by mouth daily. OFFICE VISIT NEEDED FOR ADDITIONAL REFILLS     Psychiatry:  Antidepressants - SSRI - sertraline  Passed - 02/01/2024  1:11 PM      Passed - AST in normal range and within 360 days    AST  Date Value Ref Range Status  04/26/2023 21 10 - 40 U/L Final   SGOT(AST)  Date Value Ref Range Status  07/22/2013 17 15 - 37 Unit/L Final         Passed - ALT in normal range and within 360 days    ALT  Date Value Ref Range Status  04/26/2023 26 9 - 46 U/L Final   SGPT (ALT)  Date Value Ref Range Status  07/22/2013 51 12 - 78 U/L Final         Passed - Completed PHQ-2 or PHQ-9 in the last 360 days      Passed - Valid encounter within last 6 months    Recent Outpatient Visits           2 months ago Acute gout of right foot, unspecified cause   [facility] Gareth Clarity F, FNP   3 months ago Acute gout of right foot, unspecified cause   [facility] Gareth Clarity FALCON, FNP       Future Appointments             In 2 days Francisca Redell BROCKS, MD [facility] [place]

## 2024-02-02 ENCOUNTER — Inpatient Hospital Stay

## 2024-02-02 VITALS — BP 145/87 | HR 75 | Temp 96.4°F | Resp 18

## 2024-02-02 DIAGNOSIS — I110 Hypertensive heart disease with heart failure: Secondary | ICD-10-CM | POA: Diagnosis not present

## 2024-02-02 DIAGNOSIS — I4891 Unspecified atrial fibrillation: Secondary | ICD-10-CM | POA: Diagnosis not present

## 2024-02-02 DIAGNOSIS — Z7902 Long term (current) use of antithrombotics/antiplatelets: Secondary | ICD-10-CM | POA: Diagnosis not present

## 2024-02-02 DIAGNOSIS — I252 Old myocardial infarction: Secondary | ICD-10-CM | POA: Diagnosis not present

## 2024-02-02 DIAGNOSIS — I2510 Atherosclerotic heart disease of native coronary artery without angina pectoris: Secondary | ICD-10-CM | POA: Diagnosis not present

## 2024-02-02 DIAGNOSIS — E611 Iron deficiency: Secondary | ICD-10-CM | POA: Diagnosis not present

## 2024-02-02 DIAGNOSIS — E785 Hyperlipidemia, unspecified: Secondary | ICD-10-CM | POA: Diagnosis not present

## 2024-02-02 DIAGNOSIS — Z79899 Other long term (current) drug therapy: Secondary | ICD-10-CM | POA: Diagnosis not present

## 2024-02-02 DIAGNOSIS — D509 Iron deficiency anemia, unspecified: Secondary | ICD-10-CM

## 2024-02-02 MED ORDER — IRON SUCROSE 20 MG/ML IV SOLN
200.0000 mg | Freq: Once | INTRAVENOUS | Status: AC
  Administered 2024-02-02: 200 mg via INTRAVENOUS

## 2024-02-02 NOTE — Patient Instructions (Signed)

## 2024-02-03 ENCOUNTER — Ambulatory Visit: Admitting: Urology

## 2024-02-03 ENCOUNTER — Encounter: Payer: Self-pay | Admitting: Urology

## 2024-02-03 NOTE — Progress Notes (Signed)
 I called [phone] with all information needed for expedited appeal of Repatha .  It was denied because we did not have a current LDL C less than 6 mos old.  He had new LDL C drawn Monday and I have faxed this to [phone].

## 2024-02-04 ENCOUNTER — Inpatient Hospital Stay

## 2024-02-04 VITALS — BP 141/92 | HR 66 | Temp 98.0°F | Resp 18

## 2024-02-04 DIAGNOSIS — I2510 Atherosclerotic heart disease of native coronary artery without angina pectoris: Principal | ICD-10-CM

## 2024-02-04 DIAGNOSIS — I10 Essential (primary) hypertension: Principal | ICD-10-CM

## 2024-02-04 DIAGNOSIS — Z79899 Other long term (current) drug therapy: Secondary | ICD-10-CM | POA: Diagnosis not present

## 2024-02-04 DIAGNOSIS — D509 Iron deficiency anemia, unspecified: Secondary | ICD-10-CM

## 2024-02-04 DIAGNOSIS — E611 Iron deficiency: Secondary | ICD-10-CM | POA: Diagnosis not present

## 2024-02-04 DIAGNOSIS — I252 Old myocardial infarction: Secondary | ICD-10-CM | POA: Diagnosis not present

## 2024-02-04 DIAGNOSIS — I4891 Unspecified atrial fibrillation: Secondary | ICD-10-CM | POA: Diagnosis not present

## 2024-02-04 DIAGNOSIS — E785 Hyperlipidemia, unspecified: Secondary | ICD-10-CM | POA: Diagnosis not present

## 2024-02-04 DIAGNOSIS — I110 Hypertensive heart disease with heart failure: Secondary | ICD-10-CM | POA: Diagnosis not present

## 2024-02-04 DIAGNOSIS — Z7902 Long term (current) use of antithrombotics/antiplatelets: Secondary | ICD-10-CM | POA: Diagnosis not present

## 2024-02-04 MED ORDER — SODIUM CHLORIDE 0.9% FLUSH
10.0000 mL | Freq: Once | INTRAVENOUS | Status: AC | PRN
  Administered 2024-02-04: 10 mL
  Filled 2024-02-04: qty 10

## 2024-02-04 MED ORDER — IRON SUCROSE 20 MG/ML IV SOLN
200.0000 mg | Freq: Once | INTRAVENOUS | Status: AC
  Administered 2024-02-04: 200 mg via INTRAVENOUS
  Filled 2024-02-04: qty 10

## 2024-02-04 MED ORDER — EZETIMIBE 10 MG TABLET
ORAL_TABLET | Freq: Every day | ORAL | 3 refills | 90.00000 days | Status: CP
Start: 2024-02-04 — End: ?

## 2024-02-04 MED ORDER — CHLORTHALIDONE 25 MG TABLET
ORAL_TABLET | Freq: Every day | ORAL | 3 refills | 90.00000 days | Status: CP
Start: 2024-02-04 — End: ?

## 2024-02-04 MED ORDER — NITROGLYCERIN 0.4 MG SUBLINGUAL TABLET
ORAL_TABLET | SUBLINGUAL | 2 refills | 1.00000 days | Status: CP | PRN
Start: 2024-02-04 — End: ?

## 2024-02-04 NOTE — Patient Instructions (Signed)
 Ryan Hale,    It was nice seeing you!    You are doing very well today, so we will not change any of your medications.    Like you, I suspect that your blood pressure is elevated because of the recent prednisone  injection.    I will plan to see you back in clinic in 6 months.

## 2024-02-04 NOTE — Progress Notes (Signed)
 No chest pain or sob.  His compay is switching insurance during open enrollment so he should be able to stay with [facility].  I am in the process of having his Repatha  PA completed.

## 2024-02-04 NOTE — Progress Notes (Signed)
 DIVISION OF CARDIOLOGY  [facility]    Return Patient Clinic Note- [place]    ASSESSMENT / PLAN    Coronary artery disease: Patient is s/p PCI to LAD & RPL (12/2017) in setting NSTEMI. He has known CTO of LCx that would be a high risk intervention. Long hx of atypical CP/reflux w/ multiple ED visits. Normal nuclear stress 08/2018. Saw Mallie Capes, NP in the Chest Pain clinic in 01/2023 for what he now believes was a response to accidentally taking too much suboxone. He has not experienced his anginal equivalent of shortness of breath since stent placement in 2019. BP and HR are at goal. Of note, patient was unable to tolerate beta blocker due to sexual side effects and fatigue.  - ASA 81 mg daily  - Repatha  140 mg Victoria q2 weeks (refilled)     Hyperlipidemia: Hx of statin intolerance/myalgias. Now on Zetia  and Repatha  (switched back to this from Praluent  due to another insurance change 2024).  Lipid panel 10/2022 shows TC 138, HDL 37, LDL 79.   - Repatha  140 mg Jette q2 weeks  - Zetia  10 mg daily; refilled this today     HTN: BP 147/85, atypically high for him.  BP 124/79 at last visit.  Patient recently received a prednisone  injection in his shoulder, which may be contributing (patient notes that his blood pressures at home went up after this injection).  No medication changes today.  - Chlorthalidone  25 mg daily; refilled this today    RTC in 6 months    Altamese Presume, MD, MPH  Assistant Professor, Division of Cardiology  [facility]  ______________________________________________________________________    PCP: [facility]  Patient: Ryan Hale  DOB: December 22, 1975    SUBJECTIVE    HPI: Ryan Hale is a 48 y.o. male with a history of CAD, familial HLD, HTN and obesity. Patient presents today for follow up.    Patient was last seen in clinic by me on 02/12/2023. Since that time, he states that he has done well and has no complaints.  He was seen in the ED on 01/29/2024 with left-sided shoulder pain, thought to be related to tendinitis.  He has since received a prednisone  injection in his shoulder which has helped his pain.  He has continued to take all of his cardiac medications without issue. Denies chest pain, palpitations, swelling, lightheadedness, dizziness, or syncope.  ______________________________________________________________________  Cardiovascular History & Procedures:    Cath / PCI:  LHC 12/10/2017:  Findings:  Patent stent in the LAD  Successful PCI of postero-lateral branch of the RCA with placement of a Xience drug eluting stent     LHC 12/08/2017 (for NSTEMI):  Findings:  Mild lateral wall hypokinesis with preserved LV systolic function  Coronary artery disease including 50% mid-RCA stenosis, 99% rPL stenosis, occluded proximal circumflex, 70% mid-PDA stenosis and 90% distal LAD stenosis  Successful PCI of distal LAD with placement of an Onyx drug eluting stent    CV Surgery:  None    EP Procedures and Devices:  Zio Patch, 09/2019:  Patient had a min HR of 28 bpm, max HR of 168 bpm, and avg HR of 82   bpm. Predominant underlying rhythm was Sinus Rhythm. 1 episode(s) of   AV Block (2nd?? Mobitz II) occurred, lasting a total of 2 secs. Isolated SVEs   were rare (<1.0%, 1492), SVE Couplets were rare (<1.0%, 4), and no SVE   Triplets were present.  Isolated VEs were rare (<1.0%, 5), and no VE   Couplets or VE Triplets were present.     There were 22 patient triggered events/diary entries.  The majority of them corresponded to sinus rhythm with PAC.  A few episodes corresponded to sinus rhythm.     Non-Invasive Evaluation(s):  NM SPECT Stress, 09/02/2018:  Impressions:   - Normal myocardial perfusion study   - No evidence of significant ischemia or scar is noted.   - Post stress: Global systolic function is normal. The ejection fraction   was greater than 65%.   - Attenuation CT scan shows post PCI findings     TTE 03/12/2018:  Limited study to evaluate ventricular function  Left ventricular hypertrophy - mild  Normal left ventricular systolic function, ejection fraction > 55%  ______________________________________________________________________  ______________________________________________________________________   MEDICATIONS:  Reviewed in Epic:  Current Outpatient Medications   Medication Sig Dispense Refill    acetaminophen  (TYLENOL ) 500 MG tablet Take 1 tablet (500 mg total) by mouth every four (4) hours as needed.      albuterol  HFA 90 mcg/actuation inhaler Inhale 2 puffs every six (6) hours as needed.      aspirin  81 MG chewable tablet Chew 1 tablet (81 mg total) daily. 30 tablet 11    budesonide -formoteroL  (SYMBICORT ) 160-4.5 mcg/actuation inhaler Inhale 2 puffs.      buprenorphine-naloxone (SUBOXONE) 8-2 mg sublingual film PLACE 2 FILMS EVERY DAY BY SUBLINGUAL ROUTE AS DIRECTED FOR 30 DAYS.      chlorthalidone  (HYGROTON ) 25 MG tablet Take 1 tablet (25 mg total) by mouth daily. TAKE 1 TABLET(25 MG) BY MOUTH DAILY 90 tablet 3    diclofenac sodium (VOLTAREN) 1 % gel Apply 2 g topically two (2) times a day as needed.      evolocumab  (REPATHA  SURECLICK) 140 mg/mL PnIj Inject the contents of one pen (140 mg) under the skin every fourteen (14) days. 6 mL 3    ezetimibe  (ZETIA ) 10 mg tablet Take 1 tablet (10 mg total) by mouth daily. TAKE 1 TABLET(10 MG) BY MOUTH DAILY 90 tablet 3    fluticasone  propionate (FLONASE ) 50 mcg/actuation nasal spray 2 sprays into each nostril daily. 16 g 0    loratadine (CLARITIN) 10 mg tablet Take 1 tablet (10 mg total) by mouth daily as needed.      nitroglycerin  (NITROSTAT ) 0.4 MG SL tablet Place 1 tablet (0.4 mg total) under the tongue every five (5) minutes as needed for chest pain. 30 tablet 2    sertraline  (ZOLOFT ) 25 MG tablet Take 1 tablet (25 mg total) by mouth daily.      tadalafiL  (CIALIS ) 5 MG tablet Take 1 tablet (5 mg total) by mouth once as needed for erectile dysfunction for up to 1 dose. 30 tablet 1 methylPREDNISolone (MEDROL DOSEPACK) 4 mg tablet follow package directions (Patient not taking: Reported on 02/04/2024) 1 each 0    naloxone (NARCAN) 4 mg nasal spray Never needed       No current facility-administered medications for this visit.     ______________________________________________________________________   ALLERGIES:  Reviewed in Epic:  Allergies   Allergen Reactions    Ace Inhibitors Swelling    Lisinopril Swelling    Norvasc [Amlodipine] Palpitations     ______________________________________________________________________  ______________________________________________________________________  FAMILY HISTORY:    Family History   Problem Relation Age of Onset    Liver disease Mother     Heart attack Father     Hyperlipidemia Sister  Hypertension Sister      ______________________________________________________________________  ROS:  All other systems reviewed and negative except for what is listed in the HPI  ______________________________________________________________________  OBJECTIVE  BP 147/85  - Pulse 71  - Temp 36.1 ??C (97 ??F)  - Resp 20  - Ht 180.3 cm (5' 11)  - Wt (!) 116.4 kg (256 lb 9.6 oz)  - SpO2 96%  - BMI 35.79 kg/m??     PHYSICAL EXAMINATION:   GENERAL:  Alert, NAD  EYES: Sclerae clear, EOMI b/l  ENT:  MMM  NECK: Supple  CARDIOVASCULAR:  Normal rate and regular rhythm, normal S1/S2, no murmurs, rubs, or gallops. No peripheral edema.  RESPIRATORY:  Clear to auscultation bilaterally.  No wheezes, crackles, or rhonchi. Normal work of breathing.  ABDOMEN/GI:  Soft, non-tender, non-distended with normoactive bowel sounds.  MSK: No effusions  NEUROLOGIC: Motor exam grossly non-focal. Alert and appropriately conversational  SKIN: No visible rashes  PSYCH:  Normal mental status, mood, and affect.  ______________________________________________________________________      OTHER PERTINENT LABS/STUDIES:  Lab Results   Component Value Date    LDL Direct 86.0 02/01/2024    Cholesterol, LDL, Calculated 68 12/07/2019    Cholesterol, Non-HDL, Calculated 96 12/07/2019    Cholesterol, HDL 37 (L) 12/07/2019    INR 1.23 11/18/2022    PRO-BNP 28.5 11/21/2018    Creatinine 1.02 11/18/2022    Potassium 3.8 11/18/2022    BUN 10 11/18/2022

## 2024-02-04 NOTE — Telephone Encounter (Addendum)
 Started another PA with Authorization Number [id number].  [facility] stated if I resend info with new lab LDL-C and put urgent they would let me know by Monday if approved.  Appeal with first PA was not considered Urgent and will wait 30 days.  I will call Monday to check status of what I faxed Friday.    Ph [phone]

## 2024-02-07 ENCOUNTER — Inpatient Hospital Stay: Attending: Oncology

## 2024-02-07 VITALS — BP 136/79 | HR 76 | Temp 98.0°F | Resp 18

## 2024-02-07 DIAGNOSIS — D509 Iron deficiency anemia, unspecified: Secondary | ICD-10-CM

## 2024-02-07 DIAGNOSIS — E611 Iron deficiency: Secondary | ICD-10-CM | POA: Diagnosis not present

## 2024-02-07 MED ORDER — SODIUM CHLORIDE 0.9% FLUSH
10.0000 mL | Freq: Once | INTRAVENOUS | Status: AC | PRN
  Administered 2024-02-07: 10 mL
  Filled 2024-02-07: qty 10

## 2024-02-07 MED ORDER — IRON SUCROSE 20 MG/ML IV SOLN
200.0000 mg | Freq: Once | INTRAVENOUS | Status: AC
  Administered 2024-02-07: 200 mg via INTRAVENOUS
  Filled 2024-02-07: qty 10

## 2024-02-07 NOTE — Telephone Encounter (Signed)
 REPATHA  has been approved through  02/06/2025.  Pt aware.  Copay card is activated and shared with [facility].  RxBin:  [id number]  RxPCN:  [id number], RxGrp:  [id number]  ID:  [id number]

## 2024-02-07 NOTE — Telephone Encounter (Signed)
 REPATHA  new PA started over the phone and questions answered over the phone and I then faxed to [facility] [phone].  PA NUMBER:  [id number].

## 2024-02-07 NOTE — Progress Notes (Signed)
 Patient declined to wait the 30 minutes for post iron infusion observation today. Tolerated infusion well. VSS.

## 2024-02-11 ENCOUNTER — Inpatient Hospital Stay

## 2024-02-11 VITALS — BP 125/87 | HR 68 | Temp 98.4°F | Resp 18

## 2024-02-11 DIAGNOSIS — E611 Iron deficiency: Secondary | ICD-10-CM | POA: Diagnosis not present

## 2024-02-11 DIAGNOSIS — D509 Iron deficiency anemia, unspecified: Secondary | ICD-10-CM

## 2024-02-11 MED ORDER — IRON SUCROSE 20 MG/ML IV SOLN
200.0000 mg | Freq: Once | INTRAVENOUS | Status: AC
  Administered 2024-02-11: 200 mg via INTRAVENOUS
  Filled 2024-02-11: qty 10

## 2024-02-11 MED ORDER — SODIUM CHLORIDE 0.9% FLUSH
10.0000 mL | Freq: Once | INTRAVENOUS | Status: AC | PRN
  Administered 2024-02-11: 10 mL
  Filled 2024-02-11: qty 10

## 2024-02-22 DIAGNOSIS — F1120 Opioid dependence, uncomplicated: Secondary | ICD-10-CM | POA: Diagnosis not present

## 2024-02-29 DIAGNOSIS — F1120 Opioid dependence, uncomplicated: Secondary | ICD-10-CM | POA: Diagnosis not present

## 2024-03-05 DIAGNOSIS — F1120 Opioid dependence, uncomplicated: Secondary | ICD-10-CM | POA: Diagnosis not present

## 2024-03-13 ENCOUNTER — Emergency Department (HOSPITAL_COMMUNITY)

## 2024-03-13 ENCOUNTER — Emergency Department (HOSPITAL_COMMUNITY)
Admission: EM | Admit: 2024-03-13 | Discharge: 2024-03-14 | Disposition: A | Discharge status: Home / Self Care | Attending: Emergency Medicine | Admitting: Emergency Medicine

## 2024-03-13 ENCOUNTER — Other Ambulatory Visit: Payer: Self-pay

## 2024-03-13 ENCOUNTER — Encounter (HOSPITAL_COMMUNITY): Payer: Self-pay

## 2024-03-13 DIAGNOSIS — R079 Chest pain, unspecified: Secondary | ICD-10-CM

## 2024-03-13 LAB — CBC WITH DIFFERENTIAL/PLATELET
Abs Immature Granulocytes: 0.04 K/uL (ref 0.00–0.07)
Basophils Absolute: 0.1 K/uL (ref 0.0–0.1)
Basophils Relative: 1 %
Eosinophils Absolute: 0 K/uL (ref 0.0–0.5)
Eosinophils Relative: 1 %
HCT: 47.7 % (ref 39.0–52.0)
Hemoglobin: 14.5 g/dL (ref 13.0–17.0)
Immature Granulocytes: 1 %
Lymphocytes Relative: 22 %
Lymphs Abs: 1.9 K/uL (ref 0.7–4.0)
MCH: 23.5 pg — ABNORMAL LOW (ref 26.0–34.0)
MCHC: 30.4 g/dL (ref 30.0–36.0)
MCV: 77.3 fL — ABNORMAL LOW (ref 80.0–100.0)
Monocytes Absolute: 0.9 K/uL (ref 0.1–1.0)
Monocytes Relative: 10 %
Neutro Abs: 5.6 K/uL (ref 1.7–7.7)
Neutrophils Relative %: 65 %
Platelets: 467 K/uL — ABNORMAL HIGH (ref 150–400)
RBC: 6.17 MIL/uL — ABNORMAL HIGH (ref 4.22–5.81)
RDW: 19.9 % — ABNORMAL HIGH (ref 11.5–15.5)
WBC: 8.4 K/uL (ref 4.0–10.5)
nRBC: 0 % (ref 0.0–0.2)

## 2024-03-13 LAB — BASIC METABOLIC PANEL WITH GFR
Anion gap: 9 (ref 5–15)
BUN: 13 mg/dL (ref 6–20)
CO2: 29 mmol/L (ref 22–32)
Calcium: 9 mg/dL (ref 8.9–10.3)
Chloride: 99 mmol/L (ref 98–111)
Creatinine, Ser: 1.17 mg/dL (ref 0.61–1.24)
GFR, Estimated: >60 mL/min (ref >60)
Glucose, Bld: 102 mg/dL — ABNORMAL HIGH (ref 70–99)
Potassium: 3.3 mmol/L — ABNORMAL LOW (ref 3.5–5.1)
Sodium: 137 mmol/L (ref 135–145)

## 2024-03-13 LAB — D-DIMER, QUANTITATIVE: D-Dimer, Quant: 1.05 ug{FEU}/mL — ABNORMAL HIGH (ref 0.00–0.50)

## 2024-03-13 LAB — TROPONIN T, HIGH SENSITIVITY
Troponin T High Sensitivity: <15 ng/L (ref 0–19)
Troponin T High Sensitivity: <15 ng/L (ref 0–19)

## 2024-03-13 MED ORDER — POTASSIUM CHLORIDE CRYS ER 20 MEQ PO TBCR
40.0000 meq | EXTENDED_RELEASE_TABLET | Freq: Once | ORAL | Status: AC
  Administered 2024-03-13: 40 meq via ORAL
  Filled 2024-03-13: qty 2

## 2024-03-13 MED ORDER — IOHEXOL 350 MG/ML SOLN
75.0000 mL | Freq: Once | INTRAVENOUS | Status: AC | PRN
  Administered 2024-03-13: 75 mL via INTRAVENOUS

## 2024-03-13 MED ORDER — LACTATED RINGERS IV BOLUS
1000.0000 mL | Freq: Once | INTRAVENOUS | Status: AC
  Administered 2024-03-13: 1000 mL via INTRAVENOUS

## 2024-03-13 NOTE — ED Notes (Signed)
 Patient transported to CT

## 2024-03-13 NOTE — Discharge Instructions (Addendum)
 Follow-up closely with your cardiologist.  Fortunately workup here was reassuring.  There is no sign of blood clot in your lung, pneumonia, heart attack or other emergent cause of your pain.  Please come back to the emergency room if you have any new or worsening symptoms.

## 2024-03-13 NOTE — ED Provider Notes (Signed)
 [place] EMERGENCY DEPARTMENT AT [facility] Provider Note   CSN: [id number] Arrival date & time: 03/13/24  8063     Patient presents with: Chest Pain   Dale Kennedy is a 48 y.o. male.  He has history of CAD, intermittent A-fib, not on anticoagulation.  He has a history of hyperlipidemia, hypertension, asthma, leukocytosis and anxiety.  He was seen today complaining of chest pain started 3 days prior to arrival.  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  He was feeling sharp right anterior chest pain that lasted for about half an hour and once he got here he felt better.  Does take sertraline  daily for anxiety and took 1 when he started experiencing symptoms, because he had not taken it yet.  He denies shortness of breath, denies leg pain or swelling, denies pleurisy, denies fever or chills.  He states his friend has been very dark and he thinks he is dehydrated.  Reports he does not drink much water at work he does not have time to the bathroom.  Denies cough, fever, assisted    Chest Pain      Prior to Admission medications   Medication Sig Start Date End Date Taking? Authorizing Provider  allopurinol  (ZYLOPRIM ) 100 MG tablet Take 1 tablet (100 mg total) by mouth daily. Do not start until flare is resolved 11/10/23  Yes Gareth Mliss FALCON, FNP  aspirin  EC 325 MG tablet Take 650 mg by mouth every 6 (six) hours as needed for mild pain (pain score 1-3).   Yes [provider]  Buprenorphine HCl-Naloxone HCl 2-0.5 MG FILM Place 1 Film under the tongue 2 (two) times daily. 05/26/23  Yes [provider]  chlorthalidone  (HYGROTON ) 25 MG tablet Take 25 mg by mouth daily.  12/19/18  Yes [provider]  clomiPHENE  (CLOMID ) 50 MG tablet Take 0.5 tablets (25 mg total) by mouth daily. 06/09/23  Yes Francisca Redell BROCKS, MD  colchicine  0.6 MG tablet Take 1.2 mg ( 2 tabs) when you pick up prescription, then can take 1 tab (0.6 mg) one hour later.   Then take 1 tab daily for three days. 11/10/23  Yes Pender, Julie F, FNP  Evolocumab  (REPATHA  SURECLICK) 140 MG/ML SOAJ Inject into the skin every 14 (fourteen) days.  05/10/19  Yes [provider]  ezetimibe  (ZETIA ) 10 MG tablet TAKE 1 TABLET BY MOUTH EVERY DAY 10/22/23  Yes Tapia, Leisa, PA-C  nitroGLYCERIN  (NITROSTAT ) 0.4 MG SL tablet Place 1 tablet under the tongue as needed for chest pain. 12/11/17  Yes [provider]  sertraline  (ZOLOFT ) 25 MG tablet Take 1 tablet (25 mg total) by mouth daily. OFFICE VISIT NEEDED FOR ADDITIONAL REFILLS 02/01/24  Yes Tapia, Leisa, PA-C  tadalafil  (CIALIS ) 5 MG tablet Take 0.5-2 tablets (2.5-10 mg total) by mouth daily as needed for erectile dysfunction (take 45 minutes prior to sexual activity). 06/09/23  Yes Francisca Redell BROCKS, MD    Allergies: Ace inhibitors    Review of Systems  Cardiovascular:  Positive for chest pain.    Updated Vital Signs BP 136/83   Pulse 69   Temp 98.4 F (36.9 C) (Oral)   Resp 17   Ht 5' 11 (1.803 m)   Wt 111.1 kg   SpO2 94%   BMI 34.17 kg/m   Physical Exam Vitals and nursing note reviewed.  Constitutional:      General: He is not in acute distress.    Appearance: He is  well-developed.  HENT:     Head: Normocephalic and atraumatic.  Eyes:     Conjunctiva/sclera: Conjunctivae normal.  Cardiovascular:     Rate and Rhythm: Normal rate and regular rhythm.     Heart sounds: No murmur heard. Pulmonary:     Effort: Pulmonary effort is normal. No respiratory distress.     Breath sounds: Normal breath sounds.  Abdominal:     Palpations: Abdomen is soft.     Tenderness: There is no abdominal tenderness.  Musculoskeletal:        General: No swelling.     Cervical back: Neck supple.     Right lower leg: No tenderness. No edema.     Left lower leg: No tenderness. No edema.  Skin:    General: Skin is warm and dry.     Capillary Refill: Capillary refill takes less than 2 seconds.  Neurological:      General: No focal deficit present.     Mental Status: He is alert and oriented to person, place, and time.  Psychiatric:        Mood and Affect: Mood normal.     (all labs ordered are listed, but only abnormal results are displayed) Labs Reviewed  D-DIMER, QUANTITATIVE - Abnormal; Notable for the following components:      Result Value   D-Dimer, Quant 1.05 (*)    All other components within normal limits  CBC WITH DIFFERENTIAL/PLATELET - Abnormal; Notable for the following components:   RBC 6.17 (*)    MCV 77.3 (*)    MCH 23.5 (*)    RDW 19.9 (*)    Platelets 467 (*)    All other components within normal limits  BASIC METABOLIC PANEL WITH GFR - Abnormal; Notable for the following components:   Potassium 3.3 (*)    Glucose, Bld 102 (*)    All other components within normal limits  TROPONIN T, HIGH SENSITIVITY  TROPONIN T, HIGH SENSITIVITY    EKG: EKG Interpretation Date/Time:  Monday March 13 2024 19:47:39 EST Ventricular Rate:  109 PR Interval:  194 QRS Duration:  82 QT Interval:  328 QTC Calculation: 441 R Axis:   73  Text Interpretation: Sinus tachycardia Possible Left atrial enlargement Borderline ECG When compared with ECG of 27-Jul-2021 20:17, No significant change was found Confirmed by Lorette Mayo [phone]) on 03/13/2024 11:18:35 PM  Radiology: CT Angio Chest PE W and/or Wo Contrast Result Date: 03/13/2024 EXAM: CTA of the Chest with contrast for PE 03/13/2024 11:11:12 PM TECHNIQUE: CTA of the chest was performed after the administration of 75 mL of iohexol  (OMNIPAQUE ) 350 MG/ML injection. Multiplanar reformatted images are provided for review. MIP images are provided for review. Automated exposure control, iterative reconstruction, and/or weight based adjustment of the mA/kV was utilized to reduce the radiation dose to as low as reasonably achievable. COMPARISON: None available. CLINICAL HISTORY: Pulmonary embolism (PE) suspected, low to intermediate prob,  positive D-dimer. FINDINGS: PULMONARY ARTERIES: Pulmonary arteries are adequately opacified for evaluation. No pulmonary embolism. Main pulmonary artery measures in the upper limits of normal. MEDIASTINUM: The heart demonstrates left anterior descending coronary artery calcification. The pericardium demonstrates no acute abnormality. There is no acute abnormality of the thoracic aorta. LYMPH NODES: No mediastinal, hilar or axillary lymphadenopathy. LUNGS AND PLEURA: Left lower lobe pulmonary micronodule. No focal consolidation or pulmonary edema. No pleural effusion or pneumothorax. UPPER ABDOMEN: Limited images of the upper abdomen are unremarkable. SOFT TISSUES AND BONES: No acute bone or soft tissue abnormality. IMPRESSION:  1. No pulmonary embolism. 2. Left lower lobe pulmonary micronodule; per Fleischner Society Guidelines for a solid nodule 5 mm in a patient of unknown risk without high-risk features, no routine follow-up is recommended. Electronically signed by: Morgane Naveau MD 03/13/2024 11:18 PM EST RP Workstation: [id number]   DG Chest 2 View Result Date: 03/13/2024 CLINICAL DATA:  Chest pain, left shoulder pain EXAM: CHEST - 2 VIEW COMPARISON:  07/29/2021 FINDINGS: Frontal and lateral views of the chest demonstrate an unremarkable cardiac silhouette. No acute airspace disease, effusion, or pneumothorax. No acute bony abnormalities. IMPRESSION: 1. No acute intrathoracic process. Electronically Signed   By: Ozell Daring M.D.   On: 03/13/2024 21:49     Procedures   Medications Ordered in the ED  lactated ringers  bolus 1,000 mL (0 mLs Intravenous Stopped 03/13/24 2130)  iohexol  (OMNIPAQUE ) 350 MG/ML injection 75 mL (75 mLs Intravenous Contrast Given 03/13/24 2255)  potassium chloride  SA (KLOR-CON  M) CR tablet 40 mEq (40 mEq Oral Given 03/13/24 2330)                                    Medical Decision Making Differential diagnose includes but not limited to ACS, PE, pneumonia,  pneumothorax, aortic dissection, pericarditis, other  ED course: Patient presents to the ER for chest pain started just prior to arrival lasted only 30 minutes and fully resolved prior to my evaluation.  He is worried because he does have history of CAD.  Follows with [facility] cardiology.  EKG showed sinus tachycardia, remained in normal monitor, currently resolved with IV fluids.  He was complaining Emergency Department, pain is not reproducible but is also not exertional, not associated with dizziness or sweating or radiation of the pain.  Does report he has some left shoulder pain but states it been a chronic problem for months and is unchanged.    BMP with mild hypokalemia, CBC does not show leukocytosis or anemia, chest x-ray shows no pulmonary edema or infiltrate.  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  It was elevated 1.056 in the past 5 years criteria and CTA was ordered.  Patient had no PE, did show a micronodule, patient informed to follow-up with PCP about this to see if he will need repeat CT scan.  Former smoker.  Second troponin was also negative, he has remained pain-free throughout the ED visit.  Patient advised follow-up with his cardiologist at [facility] and he was given strict return precautions.  Amount and/or Complexity of Data Reviewed Labs: ordered. Radiology: ordered and independent interpretation performed. Decision-making details documented in ED Course.    Details: Chest x-ray shows no pulmonary edema or infiltrate  PE shows no PE  Risk Prescription drug management.        Final diagnoses:  Chest pain, unspecified type    ED Discharge Orders     None          Suellen Sherran LABOR, PA-C 03/13/24 2352

## 2024-03-13 NOTE — ED Triage Notes (Signed)
 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

## 2024-03-13 NOTE — ED Notes (Signed)
 Pt states he took his anxiety meds prior to arrival & his chest pain has resolved.

## 2024-03-14 ENCOUNTER — Encounter: Payer: Self-pay | Admitting: Oncology

## 2024-03-26 ENCOUNTER — Other Ambulatory Visit: Payer: Self-pay | Admitting: Nurse Practitioner

## 2024-03-26 DIAGNOSIS — M109 Gout, unspecified: Secondary | ICD-10-CM

## 2024-03-29 NOTE — Telephone Encounter (Signed)
 Requested Prescriptions  Pending Prescriptions Disp Refills   allopurinol  (ZYLOPRIM ) 100 MG tablet [Pharmacy Med Name: ALLOPURINOL  100 MG TABLET] 90 tablet 1    Sig: TAKE 1 TABLET (100 MG TOTAL) BY MOUTH DAILY. DO NOT START UNTIL FLARE IS RESOLVED     Endocrinology:  Gout Agents - allopurinol  Failed - 03/29/2024 10:44 AM      Failed - Uric Acid in normal range and within 360 days    No results found for: POCURA, LABURIC       Passed - Cr in normal range and within 360 days    Creatinine  Date Value Ref Range Status  01/24/2024 1.12 0.61 - 1.24 mg/dL Final   Creat  Date Value Ref Range Status  04/26/2023 1.00 0.60 - 1.29 mg/dL Final   Creatinine, Ser  Date Value Ref Range Status  03/13/2024 1.17 0.61 - 1.24 mg/dL Final   Creatinine, Urine  Date Value Ref Range Status  06/23/2016 82 20 - 370 mg/dL Final         Passed - Valid encounter within last 12 months    Recent Outpatient Visits           4 months ago Acute gout of right foot, unspecified cause   [facility] Gareth Mliss FALCON, FNP   5 months ago Acute gout of right foot, unspecified cause   [facility] Gareth Mliss FALCON, FNP              Passed - CBC within normal limits and completed in the last 12 months    WBC  Date Value Ref Range Status  03/13/2024 8.4 4.0 - 10.5 K/uL Final   RBC  Date Value Ref Range Status  03/13/2024 6.17 (H) 4.22 - 5.81 MIL/uL Final   Hemoglobin  Date Value Ref Range Status  03/13/2024 14.5 13.0 - 17.0 g/dL Final  95/92/7974 86.6 13.0 - 17.0 g/dL Final  97/80/7979 87.3 (L) 13.0 - 17.7 g/dL Final   HCT  Date Value Ref Range Status  03/13/2024 47.7 39.0 - 52.0 % Final   Hematocrit  Date Value Ref Range Status  05/25/2018 41.6 37.5 - 51.0 % Final   MCHC  Date Value Ref Range Status  03/13/2024 30.4 30.0 - 36.0 g/dL Final   [facility]  Date Value Ref Range Status  03/13/2024 23.5 (L) 26.0 - 34.0 pg Final   MCV  Date Value  Ref Range Status  03/13/2024 77.3 (L) 80.0 - 100.0 fL Final  05/25/2018 74 (L) 79 - 97 fL Final  07/22/2013 78 (L) 80 - 100 fL Final   No results found for: PLTCOUNTKUC, LABPLAT, POCPLA RDW  Date Value Ref Range Status  03/13/2024 19.9 (H) 11.5 - 15.5 % Final  05/25/2018 15.2 11.6 - 15.4 % Final  07/22/2013 15.3 (H) 11.5 - 14.5 % Final

## 2024-05-05 DIAGNOSIS — I2510 Atherosclerotic heart disease of native coronary artery without angina pectoris: Principal | ICD-10-CM

## 2024-05-05 MED ORDER — EZETIMIBE 10 MG TABLET
ORAL_TABLET | Freq: Every day | ORAL | 1 refills | 90.00000 days | Status: CP
Start: 2024-05-05 — End: ?

## 2024-05-05 NOTE — Telephone Encounter (Signed)
 Refill request received for patient.      Medication Requested: Zetia   Last Office Visit: 01/31/2024   Next Office Visit: Visit date not found  Last Prescriber: Olene    Nurse refill requirements met? Yes  If not met, why:     Sent to: Pharmacy per protocol  If sent to provider, which provider?:

## 2024-05-08 DIAGNOSIS — I10 Essential (primary) hypertension: Principal | ICD-10-CM

## 2024-05-08 MED ORDER — CHLORTHALIDONE 25 MG TABLET
ORAL_TABLET | Freq: Every day | ORAL | 3 refills | 90.00000 days | Status: CP
Start: 2024-05-08 — End: ?

## 2024-05-08 NOTE — Telephone Encounter (Signed)
 Refill request received for patient.      Medication Requested: chlorthalidone   Last Office Visit: 05/05/2024   Next Office Visit: Visit date not found  Last Prescriber: Olene    Nurse refill requirements met? Yes  If not met, why:     Sent to: Pharmacy per protocol  If sent to provider, which provider?:

## 2024-05-11 DIAGNOSIS — I2510 Atherosclerotic heart disease of native coronary artery without angina pectoris: Principal | ICD-10-CM

## 2024-05-11 MED ORDER — EZETIMIBE 10 MG TABLET
ORAL_TABLET | Freq: Every day | ORAL | 1 refills | 90.00000 days | Status: CP
Start: 2024-05-11 — End: ?

## 2024-05-11 NOTE — Telephone Encounter (Signed)
 Refill request received for patient.      Medication Requested: zetia   Last Office Visit: 05/08/2024   Next Office Visit: Visit date not found  Last Prescriber: Olene    Nurse refill requirements met? Yes  If not met, why:     Sent to: Pharmacy per protocol  If sent to provider, which provider?:

## 2024-07-31 ENCOUNTER — Inpatient Hospital Stay

## 2024-08-07 ENCOUNTER — Inpatient Hospital Stay: Admitting: Oncology

## 2024-08-07 ENCOUNTER — Inpatient Hospital Stay
